# Patient Record
Sex: Male | Born: 1935 | Race: White | Hispanic: No | Marital: Married | Smoking: Former smoker
Health system: Southern US, Community
[De-identification: ages and names within clinical notes are randomized; demographics above are authoritative.]

## PROBLEM LIST (undated history)

## (undated) DIAGNOSIS — I739 Peripheral vascular disease, unspecified: Secondary | ICD-10-CM

## (undated) DIAGNOSIS — H3530 Unspecified macular degeneration: Secondary | ICD-10-CM

## (undated) DIAGNOSIS — I10 Essential (primary) hypertension: Secondary | ICD-10-CM

## (undated) DIAGNOSIS — N400 Enlarged prostate without lower urinary tract symptoms: Secondary | ICD-10-CM

## (undated) DIAGNOSIS — I639 Cerebral infarction, unspecified: Secondary | ICD-10-CM

## (undated) DIAGNOSIS — J8410 Pulmonary fibrosis, unspecified: Secondary | ICD-10-CM

## (undated) DIAGNOSIS — E039 Hypothyroidism, unspecified: Secondary | ICD-10-CM

## (undated) DIAGNOSIS — C801 Malignant (primary) neoplasm, unspecified: Secondary | ICD-10-CM

## (undated) DIAGNOSIS — E119 Type 2 diabetes mellitus without complications: Secondary | ICD-10-CM

## (undated) DIAGNOSIS — I6529 Occlusion and stenosis of unspecified carotid artery: Secondary | ICD-10-CM

## (undated) DIAGNOSIS — I509 Heart failure, unspecified: Secondary | ICD-10-CM

## (undated) DIAGNOSIS — M1990 Unspecified osteoarthritis, unspecified site: Secondary | ICD-10-CM

## (undated) DIAGNOSIS — E785 Hyperlipidemia, unspecified: Secondary | ICD-10-CM

## (undated) DIAGNOSIS — J449 Chronic obstructive pulmonary disease, unspecified: Secondary | ICD-10-CM

## (undated) HISTORY — DX: Pulmonary fibrosis, unspecified: J84.10

## (undated) HISTORY — DX: Hyperlipidemia, unspecified: E78.5

## (undated) HISTORY — DX: Occlusion and stenosis of unspecified carotid artery: I65.29

## (undated) HISTORY — PX: BACK SURGERY: SHX140

## (undated) HISTORY — DX: Essential (primary) hypertension: I10

## (undated) HISTORY — PX: OTHER SURGICAL HISTORY: SHX169

## (undated) HISTORY — DX: Peripheral vascular disease, unspecified: I73.9

## (undated) HISTORY — PX: EYE SURGERY: SHX253

## (undated) HISTORY — DX: Benign prostatic hyperplasia without lower urinary tract symptoms: N40.0

## (undated) HISTORY — DX: Unspecified macular degeneration: H35.30

## (undated) HISTORY — DX: Heart failure, unspecified: I50.9

## (undated) HISTORY — DX: Cerebral infarction, unspecified: I63.9

## (undated) HISTORY — DX: Chronic obstructive pulmonary disease, unspecified: J44.9

## (undated) HISTORY — PX: SPINE SURGERY: SHX786

## (undated) NOTE — *Deleted (*Deleted)
[facility] Nurse calls with patient increase work of breathing, now tachycardic.  Bedside patient admits to feeling very short of breathe, restless and unable to be comfortable.   He has not peripheral edema.  Abdomen slight distention but soft, no pain. Definitely has accessory muscle use with breathing.  Bipap ordered BNP doubled in past week. Checking troponin.  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  Instant improvement in work of breathing noted.   With decompensation, checking troponin,  procalcitonin and d dimer.  Not large volume diuresis after oral torsemide.  Bernie Covey 60 mg IV lasix

## (undated) NOTE — *Deleted (*Deleted)
  Heart Failure Nurse Navigator Note  HFpEF 60-65%, echocardiogram performed on this admission reveals flattened septum, elevated RV pressures, right ventricle is severely enlarged. Right ventricular systolic function is moderately reduced.  Severely elevated PAP at 82.6.  Severely enlarged right atria.  Moderate MR and moderate to severe TR.  Previous echo had revealed normal right ventricular function from 07/20/2020.  He had presented to the ED by way of EMS for increasing SOB,orthopnea and weeping, swollen lower extremities.  Co morbidities:  Diabetes Hyperlipidemia Hypertension Osteoarthritis COPD   Medications:  Wears 4 liters of 02 at home Atenolol 50 mg daily Lasix 40 mg IV BID   Labs:  Sodium 134,potassium 3.3, BUN 26, creatinine 1.2  Weight 65.1 BMI 23.16     Intake 240 ml Output 1000 ml  CTA revealed no acute pulmonary embolus.  Persistent finding of interstitial lung disease.   Assessment:  General- he is awake and alert, wife is at the bedside.  States he is not quite back to his baseline.  HEENT- HOH. Pupils equal.  Cardiac- heart tones regular  Chest- scattered rhonchi   Musculoskeletal-1+ edema pitting, dressings placed by wound nurse intact on bilateral lower legs and heal of right foot.  Neuro- speech is clear, moves all extremities   Psych- is pleasant and appropriate.   Visited with patient and wife on two different occasions.  Discussed importance of limiting fluid intake to less than 2000 ml daily and stressed importance of also limiting sodium to 1500 to 2000 mg daily.  Also discussed using fresh and frozen vegetable, or if have to use canned to rinse very well.  Also discussed reporting signs and symptoms of increased lower leg edema, especially if is present in the morning with waking up.  Went over other symptoms of PND, Orthopnea, cough, increasing abdominal girth, pointed out if one day pants were comfortable fitting and the next day  tight.  All these need to be reported to cardiologist or Tina Hackney in HF clinic.  Both voice understanding.  Will continue to follow.  Jakyren Fluegge RN, CHFN   

---

## 1958-11-09 HISTORY — PX: SPINE SURGERY: SHX786

## 1999-08-27 ENCOUNTER — Encounter (INDEPENDENT_AMBULATORY_CARE_PROVIDER_SITE_OTHER): Payer: Self-pay | Admitting: Specialist

## 1999-08-27 ENCOUNTER — Other Ambulatory Visit: Admission: RE | Admit: 1999-08-27 | Discharge: 1999-08-27 | Payer: Self-pay | Admitting: Gastroenterology

## 1999-10-07 ENCOUNTER — Encounter: Admission: RE | Admit: 1999-10-07 | Discharge: 1999-10-07 | Payer: Self-pay

## 1999-10-07 ENCOUNTER — Ambulatory Visit (HOSPITAL_BASED_OUTPATIENT_CLINIC_OR_DEPARTMENT_OTHER): Admission: RE | Admit: 1999-10-07 | Discharge: 1999-10-07 | Payer: Self-pay

## 1999-12-03 ENCOUNTER — Encounter: Admission: RE | Admit: 1999-12-03 | Discharge: 1999-12-03 | Payer: Self-pay | Admitting: Family Medicine

## 1999-12-03 ENCOUNTER — Encounter: Payer: Self-pay | Admitting: Family Medicine

## 2000-11-09 DIAGNOSIS — I739 Peripheral vascular disease, unspecified: Secondary | ICD-10-CM

## 2000-11-09 HISTORY — PX: PR VEIN BYPASS GRAFT,AORTO-FEM-POP: 35551

## 2000-11-09 HISTORY — DX: Peripheral vascular disease, unspecified: I73.9

## 2001-06-21 ENCOUNTER — Ambulatory Visit (HOSPITAL_COMMUNITY): Admission: RE | Admit: 2001-06-21 | Discharge: 2001-06-21 | Payer: Self-pay | Admitting: Orthopaedic Surgery

## 2001-07-28 ENCOUNTER — Encounter: Payer: Self-pay | Admitting: Vascular Surgery

## 2001-08-01 ENCOUNTER — Ambulatory Visit (HOSPITAL_COMMUNITY): Admission: RE | Admit: 2001-08-01 | Discharge: 2001-08-01 | Payer: Self-pay | Admitting: Vascular Surgery

## 2001-08-09 HISTORY — PX: TOE AMPUTATION: SHX809

## 2001-08-25 ENCOUNTER — Encounter: Payer: Self-pay | Admitting: Vascular Surgery

## 2001-08-25 ENCOUNTER — Encounter (INDEPENDENT_AMBULATORY_CARE_PROVIDER_SITE_OTHER): Payer: Self-pay | Admitting: *Deleted

## 2001-08-25 ENCOUNTER — Inpatient Hospital Stay (HOSPITAL_COMMUNITY): Admission: RE | Admit: 2001-08-25 | Discharge: 2001-08-29 | Payer: Self-pay | Admitting: Vascular Surgery

## 2001-11-09 HISTORY — PX: ABDOMINAL AORTIC ANEURYSM REPAIR: SUR1152

## 2002-02-20 ENCOUNTER — Encounter: Admission: RE | Admit: 2002-02-20 | Discharge: 2002-02-20 | Payer: Self-pay | Admitting: Vascular Surgery

## 2002-02-20 ENCOUNTER — Encounter: Payer: Self-pay | Admitting: Vascular Surgery

## 2002-03-06 ENCOUNTER — Ambulatory Visit (HOSPITAL_COMMUNITY): Admission: RE | Admit: 2002-03-06 | Discharge: 2002-03-06 | Payer: Self-pay | Admitting: Cardiovascular Disease

## 2002-03-09 ENCOUNTER — Encounter: Payer: Self-pay | Admitting: Vascular Surgery

## 2002-03-13 ENCOUNTER — Ambulatory Visit (HOSPITAL_COMMUNITY): Admission: RE | Admit: 2002-03-13 | Discharge: 2002-03-13 | Payer: Self-pay | Admitting: Vascular Surgery

## 2002-03-16 ENCOUNTER — Inpatient Hospital Stay (HOSPITAL_COMMUNITY): Admission: RE | Admit: 2002-03-16 | Discharge: 2002-03-22 | Payer: Self-pay | Admitting: Vascular Surgery

## 2002-03-16 ENCOUNTER — Encounter (INDEPENDENT_AMBULATORY_CARE_PROVIDER_SITE_OTHER): Payer: Self-pay | Admitting: Specialist

## 2002-03-16 ENCOUNTER — Encounter: Payer: Self-pay | Admitting: Vascular Surgery

## 2002-03-17 ENCOUNTER — Encounter: Payer: Self-pay | Admitting: Vascular Surgery

## 2002-03-19 ENCOUNTER — Encounter: Payer: Self-pay | Admitting: Vascular Surgery

## 2002-05-03 ENCOUNTER — Ambulatory Visit (HOSPITAL_COMMUNITY): Admission: RE | Admit: 2002-05-03 | Discharge: 2002-05-03 | Payer: Self-pay | Admitting: Internal Medicine

## 2002-05-04 ENCOUNTER — Encounter: Payer: Self-pay | Admitting: Internal Medicine

## 2002-12-07 ENCOUNTER — Emergency Department (HOSPITAL_COMMUNITY): Admission: EM | Admit: 2002-12-07 | Discharge: 2002-12-07 | Payer: Self-pay

## 2003-01-17 ENCOUNTER — Encounter: Payer: Self-pay | Admitting: Internal Medicine

## 2003-01-17 ENCOUNTER — Encounter: Admission: RE | Admit: 2003-01-17 | Discharge: 2003-01-17 | Payer: Self-pay | Admitting: Internal Medicine

## 2003-04-11 ENCOUNTER — Encounter: Admission: RE | Admit: 2003-04-11 | Discharge: 2003-04-11 | Payer: Self-pay | Admitting: Vascular Surgery

## 2003-04-11 ENCOUNTER — Encounter: Payer: Self-pay | Admitting: Vascular Surgery

## 2003-10-01 ENCOUNTER — Encounter: Admission: RE | Admit: 2003-10-01 | Discharge: 2003-10-01 | Payer: Self-pay | Admitting: Internal Medicine

## 2003-11-10 DIAGNOSIS — I639 Cerebral infarction, unspecified: Secondary | ICD-10-CM

## 2003-11-10 HISTORY — PX: PR VEIN BYPASS GRAFT,AORTO-FEM-POP: 35551

## 2003-11-10 HISTORY — DX: Cerebral infarction, unspecified: I63.9

## 2004-03-05 ENCOUNTER — Ambulatory Visit (HOSPITAL_COMMUNITY): Admission: RE | Admit: 2004-03-05 | Discharge: 2004-03-05 | Payer: Self-pay | Admitting: Orthopaedic Surgery

## 2004-09-29 ENCOUNTER — Ambulatory Visit (HOSPITAL_COMMUNITY): Admission: RE | Admit: 2004-09-29 | Discharge: 2004-09-29 | Payer: Self-pay | Admitting: Vascular Surgery

## 2004-10-14 ENCOUNTER — Inpatient Hospital Stay (HOSPITAL_COMMUNITY): Admission: AD | Admit: 2004-10-14 | Discharge: 2004-10-15 | Payer: Self-pay | Admitting: Vascular Surgery

## 2005-02-16 ENCOUNTER — Encounter: Admission: RE | Admit: 2005-02-16 | Discharge: 2005-05-17 | Payer: Self-pay | Admitting: Internal Medicine

## 2005-05-08 ENCOUNTER — Encounter: Admission: RE | Admit: 2005-05-08 | Discharge: 2005-05-08 | Payer: Self-pay | Admitting: Internal Medicine

## 2005-06-11 ENCOUNTER — Encounter: Admission: RE | Admit: 2005-06-11 | Discharge: 2005-09-09 | Payer: Self-pay | Admitting: Internal Medicine

## 2005-08-31 ENCOUNTER — Inpatient Hospital Stay (HOSPITAL_COMMUNITY): Admission: EM | Admit: 2005-08-31 | Discharge: 2005-09-02 | Payer: Self-pay | Admitting: Emergency Medicine

## 2005-09-07 ENCOUNTER — Encounter: Admission: RE | Admit: 2005-09-07 | Discharge: 2005-09-07 | Payer: Self-pay | Admitting: Internal Medicine

## 2005-09-23 ENCOUNTER — Encounter: Admission: RE | Admit: 2005-09-23 | Discharge: 2005-09-23 | Payer: Self-pay | Admitting: Internal Medicine

## 2005-12-03 ENCOUNTER — Encounter (INDEPENDENT_AMBULATORY_CARE_PROVIDER_SITE_OTHER): Payer: Self-pay | Admitting: Specialist

## 2005-12-03 ENCOUNTER — Inpatient Hospital Stay (HOSPITAL_COMMUNITY): Admission: RE | Admit: 2005-12-03 | Discharge: 2005-12-04 | Payer: Self-pay | Admitting: Vascular Surgery

## 2005-12-03 HISTORY — PX: CAROTID ENDARTERECTOMY: SUR193

## 2006-08-16 ENCOUNTER — Encounter: Admission: RE | Admit: 2006-08-16 | Discharge: 2006-08-16 | Payer: Self-pay | Admitting: Internal Medicine

## 2006-11-09 ENCOUNTER — Emergency Department: Payer: Self-pay | Admitting: General Practice

## 2006-11-25 ENCOUNTER — Ambulatory Visit (HOSPITAL_BASED_OUTPATIENT_CLINIC_OR_DEPARTMENT_OTHER): Admission: RE | Admit: 2006-11-25 | Discharge: 2006-11-25 | Payer: Self-pay | Admitting: Orthopedic Surgery

## 2007-01-16 ENCOUNTER — Emergency Department: Payer: Self-pay | Admitting: Emergency Medicine

## 2007-05-04 ENCOUNTER — Ambulatory Visit: Payer: Self-pay | Admitting: Vascular Surgery

## 2007-05-20 ENCOUNTER — Ambulatory Visit (HOSPITAL_COMMUNITY): Admission: RE | Admit: 2007-05-20 | Discharge: 2007-05-20 | Payer: Self-pay | Admitting: Internal Medicine

## 2008-01-03 ENCOUNTER — Ambulatory Visit: Payer: Self-pay | Admitting: Vascular Surgery

## 2008-03-20 ENCOUNTER — Ambulatory Visit: Payer: Self-pay | Admitting: Vascular Surgery

## 2008-04-24 ENCOUNTER — Ambulatory Visit: Payer: Self-pay | Admitting: Vascular Surgery

## 2008-05-01 ENCOUNTER — Encounter: Admission: RE | Admit: 2008-05-01 | Discharge: 2008-05-01 | Payer: Self-pay | Admitting: Internal Medicine

## 2008-07-31 ENCOUNTER — Ambulatory Visit: Payer: Self-pay | Admitting: Vascular Surgery

## 2008-10-02 ENCOUNTER — Encounter: Admission: RE | Admit: 2008-10-02 | Discharge: 2008-11-08 | Payer: Self-pay | Admitting: Vascular Surgery

## 2008-10-09 ENCOUNTER — Ambulatory Visit: Payer: Self-pay | Admitting: Vascular Surgery

## 2008-11-14 ENCOUNTER — Inpatient Hospital Stay (HOSPITAL_COMMUNITY): Admission: EM | Admit: 2008-11-14 | Discharge: 2008-11-17 | Payer: Self-pay | Admitting: Emergency Medicine

## 2009-03-01 ENCOUNTER — Ambulatory Visit (HOSPITAL_BASED_OUTPATIENT_CLINIC_OR_DEPARTMENT_OTHER): Admission: RE | Admit: 2009-03-01 | Discharge: 2009-03-01 | Payer: Self-pay | Admitting: Orthopedic Surgery

## 2009-03-12 ENCOUNTER — Ambulatory Visit: Payer: Self-pay | Admitting: Vascular Surgery

## 2009-03-20 ENCOUNTER — Encounter: Admission: RE | Admit: 2009-03-20 | Discharge: 2009-03-20 | Payer: Self-pay | Admitting: Internal Medicine

## 2009-10-16 ENCOUNTER — Ambulatory Visit (HOSPITAL_BASED_OUTPATIENT_CLINIC_OR_DEPARTMENT_OTHER): Admission: RE | Admit: 2009-10-16 | Discharge: 2009-10-16 | Payer: Self-pay | Admitting: Orthopedic Surgery

## 2010-01-01 ENCOUNTER — Encounter: Payer: Self-pay | Admitting: Internal Medicine

## 2010-04-11 ENCOUNTER — Ambulatory Visit: Payer: Self-pay | Admitting: Vascular Surgery

## 2010-11-22 ENCOUNTER — Observation Stay (HOSPITAL_COMMUNITY)
Admission: EM | Admit: 2010-11-22 | Discharge: 2010-11-23 | Payer: Self-pay | Source: Home / Self Care | Attending: Urology | Admitting: Urology

## 2010-11-24 LAB — DIFFERENTIAL
Basophils Absolute: 0 10*3/uL (ref 0.0–0.1)
Basophils Relative: 0 % (ref 0–1)
Eosinophils Absolute: 0.4 10*3/uL (ref 0.0–0.7)
Eosinophils Relative: 3 % (ref 0–5)
Lymphocytes Relative: 14 % (ref 12–46)
Lymphs Abs: 1.5 10*3/uL (ref 0.7–4.0)
Monocytes Absolute: 1.3 10*3/uL — ABNORMAL HIGH (ref 0.1–1.0)
Monocytes Relative: 12 % (ref 3–12)
Neutro Abs: 7.5 10*3/uL (ref 1.7–7.7)
Neutrophils Relative %: 70 % (ref 43–77)

## 2010-11-24 LAB — CBC
HCT: 46.4 % (ref 39.0–52.0)
Hemoglobin: 16 g/dL (ref 13.0–17.0)
MCH: 33.8 pg (ref 26.0–34.0)
MCHC: 34.5 g/dL (ref 30.0–36.0)
MCV: 98.1 fL (ref 78.0–100.0)
Platelets: 189 10*3/uL (ref 150–400)
RBC: 4.73 MIL/uL (ref 4.22–5.81)
RDW: 12.9 % (ref 11.5–15.5)
WBC: 10.7 10*3/uL — ABNORMAL HIGH (ref 4.0–10.5)

## 2010-11-29 ENCOUNTER — Encounter: Payer: Self-pay | Admitting: Cardiology

## 2010-11-30 NOTE — Op Note (Signed)
  NAME:  Dylan Knight, Dylan Knight               ACCOUNT NO.:  [id number]  MEDICAL RECORD NO.:  [id number]          PATIENT TYPE:  OBV  LOCATION:  1444                         FACILITY:  [facility]  PHYSICIAN:  Heloise Purpura, MD      DATE OF BIRTH:  [birth year]  DATE OF PROCEDURE:  11/22/2010 DATE OF DISCHARGE:                              OPERATIVE REPORT   PREOPERATIVE DIAGNOSIS:  Scrotal bleeding.  POSTOPERATIVE DIAGNOSIS:  Scrotal bleeding.  PROCEDURE:  Scrotal exploration with control of scrotal bleeding.  SURGEON:  Heloise Purpura, MD  ANESTHESIA:  General.  COMPLICATIONS:  None.  INDICATION:  [name] is an 77 year old gentleman who underwent an office procedure yesterday for removal of a scrotal cyst.  He is on chronic antiplatelet therapy with Plavix, although have been holding this medication.  His last dose of Plavix was approximately 48 hours ago.  His wife called earlier today with concern about ongoing bleeding from the scrotum which was not controlled with compressive pressure.  He therefore brought into the emergency department for further evaluation and he was noted to have ongoing uncontrolled bleeding from his scrotal incision.  It was therefore recommended that he proceed to the operating room for exploration and control of this bleeding.  The potential risks, complications, and alternative treatment options were discussed in detail.  Informed consent was obtained.  DESCRIPTION OF PROCEDURE:  The patient was taken to the operating room and general anesthetic was administered.  He was given preoperative antibiotics, and placed in the supine position.  His genitalia was then prepped and draped in the usual sterile fashion and pressure was held on the scrotal wound during this process.  A preoperative time-out was performed.  The patient's previous chromic sutures were then removed and there was noted to be active bleeding from the superior aspect of the wound.  No obvious  arterial bleeding was identified.  Using electrocautery, hemostasis was achieved fairly medially and examination of the remainder of the wound did reveal some very mild oozing without any further significant bleeding.  These areas were also controlled with electrocautery and the skin edges were then again reapproximated with interrupted 3-0 chromic sutures.  There was noted to be a small amount of bleeding from the inferior aspect of the wound and then started toward the end of the procedure which was identified and again the cauterize resulting in excellent hemostasis.  Due to the patient's need to void prior to the procedure, the patient's bladder was then emptied with a 14-French red rubber catheter.  A compressive fluff dressing was then placed on the scrotum.  He tolerated the procedure well without complications.  He was able to be extubated and transferred to the recovery unit in satisfactory condition.     Heloise Purpura, MD     LB/MEDQ  D:  11/22/2010  T:  11/22/2010  Job:  [id number]  cc:   Bertram Millard. Dahlstedt, M.D. Fax: [phone]  Electronically Signed by Heloise Purpura MD on 11/30/2010 05:17:58 PM

## 2010-11-30 NOTE — Discharge Summary (Signed)
  NAME:  Dylan Knight, Dylan Knight               ACCOUNT NO.:  [id number]  MEDICAL RECORD NO.:  [id number]          PATIENT TYPE:  OBV  LOCATION:  1444                         FACILITY:  [facility]  PHYSICIAN:  Heloise Purpura, MD      DATE OF BIRTH:  08/16/1924  DATE OF ADMISSION:  11/22/2010 DATE OF DISCHARGE:  11/23/2010                              DISCHARGE SUMMARY   ADMISSION DIAGNOSIS:  Bleeding from scrotum.  DISCHARGE DIAGNOSIS:  Bleeding from scrotum.  HISTORY AND PHYSICAL:  For full details, please see admission history and physical.  Briefly, Dylan Knight is an 77 year old gentleman who underwent an office procedure on Friday for removal of a scrotal cyst. He is on chronic antiplatelet therapy with Plavix and was noted to have uncontrolled bleeding which persisted throughout the afternoon on November 22, 2010.  He, therefore, presented to the emergency department and was evaluated.  He indeed had bleeding from his scrotal incision which was unable to be controlled but was managed with pressure.  It was recommended that he proceed to the operating room for scrotal exploration and control of his bleeding.  HOSPITAL COURSE:  On November 22, 2010, the patient was taken to the operating room and underwent scrotal exploration.  He was noted to have a bleeding site within the scrotal incision which was controlled with electrocautery.  His wound was able to be closed primarily and he was admitted overnight for observation.  His hemoglobin remained stable and he had no further bleeding from his scrotal incision when evaluated on postoperative day #1.  He was therefore felt stable for discharge.  DISPOSITION:  Home.  DISCHARGE MEDICATIONS:  He will resume all his regular home medications excepting Plavix which will be held for the next 72 hours.  In addition, he will be administered a prescription for Vicodin to take as needed.  DISCHARGE INSTRUCTIONS:  He has been instructed to be ambulatory  but specifically told to refrain from any heavy lifting, strenuous activity, or driving.  He will continue with a fluff scrotal pressure dressing. He will tentatively plan to restart his Plavix on Tuesday if he has no evidence of bleeding between now and then and will plan to follow up with Dr. Retta Diones in early February as planned.     Heloise Purpura, MD     LB/MEDQ  D:  11/23/2010  T:  11/23/2010  Job:  [id number]  cc:   Bertram Millard. Dahlstedt, M.D. Fax: [phone]  Electronically Signed by Heloise Purpura MD on 11/30/2010 05:17:56 PM

## 2010-12-22 ENCOUNTER — Encounter (HOSPITAL_BASED_OUTPATIENT_CLINIC_OR_DEPARTMENT_OTHER)
Admission: RE | Admit: 2010-12-22 | Discharge: 2010-12-22 | Disposition: A | Discharge status: Home / Self Care | Payer: Medicare Other | Source: Ambulatory Visit | Attending: Orthopedic Surgery | Admitting: Orthopedic Surgery

## 2010-12-22 ENCOUNTER — Ambulatory Visit
Admission: RE | Admit: 2010-12-22 | Discharge: 2010-12-22 | Disposition: A | Discharge status: Home / Self Care | Payer: Medicare Other | Source: Ambulatory Visit | Attending: Orthopedic Surgery | Admitting: Orthopedic Surgery

## 2010-12-22 ENCOUNTER — Other Ambulatory Visit: Payer: Self-pay | Admitting: Orthopedic Surgery

## 2010-12-22 DIAGNOSIS — Z01811 Encounter for preprocedural respiratory examination: Secondary | ICD-10-CM

## 2010-12-22 DIAGNOSIS — Z01812 Encounter for preprocedural laboratory examination: Secondary | ICD-10-CM | POA: Insufficient documentation

## 2010-12-22 LAB — BASIC METABOLIC PANEL
BUN: 12 mg/dL (ref 6–23)
CO2: 29 mEq/L (ref 19–32)
Calcium: 9.6 mg/dL (ref 8.4–10.5)
Chloride: 100 mEq/L (ref 96–112)
Creatinine, Ser: 1.27 mg/dL (ref 0.4–1.5)
GFR calc Af Amer: >60 mL/min (ref >60)
GFR calc non Af Amer: 54 mL/min — ABNORMAL LOW (ref >60)
Glucose, Bld: 168 mg/dL — ABNORMAL HIGH (ref 70–99)
Potassium: 3.9 mEq/L (ref 3.5–5.1)
Sodium: 138 mEq/L (ref 135–145)

## 2010-12-23 ENCOUNTER — Ambulatory Visit (HOSPITAL_BASED_OUTPATIENT_CLINIC_OR_DEPARTMENT_OTHER)
Admission: RE | Admit: 2010-12-23 | Discharge: 2010-12-23 | Disposition: A | Discharge status: Home / Self Care | Payer: Medicare Other | Attending: Orthopedic Surgery | Admitting: Orthopedic Surgery

## 2010-12-23 DIAGNOSIS — Z01818 Encounter for other preprocedural examination: Secondary | ICD-10-CM | POA: Insufficient documentation

## 2010-12-23 DIAGNOSIS — Z01812 Encounter for preprocedural laboratory examination: Secondary | ICD-10-CM | POA: Insufficient documentation

## 2010-12-23 DIAGNOSIS — M65839 Other synovitis and tenosynovitis, unspecified forearm: Secondary | ICD-10-CM | POA: Insufficient documentation

## 2010-12-23 DIAGNOSIS — M6530 Trigger finger, unspecified finger: Secondary | ICD-10-CM | POA: Insufficient documentation

## 2010-12-23 LAB — POCT HEMOGLOBIN-HEMACUE: Hemoglobin: 15.8 g/dL (ref 13.0–17.0)

## 2010-12-23 LAB — GLUCOSE, CAPILLARY
Glucose-Capillary: 154 mg/dL — ABNORMAL HIGH (ref 70–99)
Glucose-Capillary: 140 mg/dL — ABNORMAL HIGH (ref 70–99)

## 2010-12-29 NOTE — Op Note (Signed)
  NAME:  Dylan Knight, Dylan Knight               ACCOUNT NO.:  [id number]  MEDICAL RECORD NO.:  [id number]           PATIENT TYPE:  LOCATION:                                 FACILITY:  PHYSICIAN:  Cindee Salt, M.D.       DATE OF BIRTH:  05/14/1924  DATE OF PROCEDURE:  12/23/2010 DATE OF DISCHARGE:                              OPERATIVE REPORT   PREOPERATIVE DIAGNOSIS:  Stenosing tenosynovitis, left thumb, left little finger.  POSTOPERATIVE DIAGNOSIS:  Stenosing tenosynovitis, left thumb, left little finger.  OPERATION:  Release A1 pulley, left thumb, left small finger.  SURGEON:  Cindee Salt, MD.  ASSISTANT:  Betha Loa, MD  ANESTHESIA:  Forearm based IV regional with local infiltration.  ANESTHESIOLOGIST:  Janetta Hora. Frederick, MD.  HISTORY:  The patient is an 78 year old male with a history of triggering of multiple fingers, most recently his left thumb, left little.  He has attempted injections without relief.  He is admitted now for release of both fingers.  Pre, peri, and postoperative course had been discussed along with the risks and complications.  He is aware that there is no guarantee with the surgery, possibility of infection, recurrence of injury to arteries, nerves, tendons, incomplete relief of symptoms, dystrophy.  In the preoperative area, the patient is seen, the extremity marked by both the patient and surgeon, antibiotic given.  PROCEDURE:  The patient was brought to the operating room where a forearm based IV regional anesthetic was carried out without difficulty, was prepped using ChloraPrep, supine position, left arm free.  A 3- minute dry time was allowed.  Time-out taken confirming the patient and procedure.  A transverse incision was made over the A1 pulley of the left thumb, carried down through subcutaneous tissue.  Bleeders were electrocauterized with bipolar.  Neurovascular structures were identified and protected with retractors.  The A1 pulley was  then released in its radial aspect.  The oblique pulley was left intact.  The thumb placed through full mobility.  No further triggering was noted.  A separate incision was then made over the left small finger, metacarpophalangeal joint, carried down through the subcutaneous tissue. The neurovascular structures were again identified and protected.  The dissection carried down to the A1 pulley.  This was released with a small incision in the A2 pulley.  Centrally, moderate tenosynovitis was present.  No further lesions were identified.  The wound was again irrigated.  The skin was closed with interrupted 5-0 Vicryl Rapide sutures.  Local infiltration was given with 0.25% Marcaine without epinephrine, approximately 4 mL was used.  Sterile compressive dressing was applied. Deflation of the tourniquet, all fingers immediately pinked.  He was taken to the recovery room for observation in satisfactory condition. He will be discharged home to return to the [facility] of [place] in 1 week, on Vicodin.          ______________________________ Cindee Salt, M.D.     GK/MEDQ  D:  12/23/2010  T:  12/24/2010  Job:  [id number]  Electronically Signed by Cindee Salt M.D. on 12/29/2010 02:26:16 PM

## 2011-01-29 ENCOUNTER — Ambulatory Visit
Admission: RE | Admit: 2011-01-29 | Discharge: 2011-01-29 | Disposition: A | Discharge status: Home / Self Care | Payer: Medicare Other | Source: Ambulatory Visit | Attending: Family Medicine | Admitting: Family Medicine

## 2011-01-29 ENCOUNTER — Other Ambulatory Visit: Payer: Self-pay | Admitting: Family Medicine

## 2011-01-29 DIAGNOSIS — R059 Cough, unspecified: Secondary | ICD-10-CM

## 2011-01-29 DIAGNOSIS — R05 Cough: Secondary | ICD-10-CM

## 2011-02-10 LAB — BASIC METABOLIC PANEL
BUN: 15 mg/dL (ref 6–23)
CO2: 30 mEq/L (ref 19–32)
Calcium: 9.8 mg/dL (ref 8.4–10.5)
Chloride: 98 mEq/L (ref 96–112)
Creatinine, Ser: 1.22 mg/dL (ref 0.4–1.5)
GFR calc Af Amer: >60 mL/min (ref >60)
GFR calc non Af Amer: 57 mL/min — ABNORMAL LOW (ref >60)
Glucose, Bld: 202 mg/dL — ABNORMAL HIGH (ref 70–99)
Potassium: 3.8 mEq/L (ref 3.5–5.1)
Sodium: 137 mEq/L (ref 135–145)

## 2011-02-10 LAB — POCT HEMOGLOBIN-HEMACUE: Hemoglobin: 17.7 g/dL — ABNORMAL HIGH (ref 13.0–17.0)

## 2011-02-10 LAB — GLUCOSE, CAPILLARY: Glucose-Capillary: 148 mg/dL — ABNORMAL HIGH (ref 70–99)

## 2011-02-18 LAB — BASIC METABOLIC PANEL
BUN: 12 mg/dL (ref 6–23)
CO2: 27 mEq/L (ref 19–32)
Calcium: 9.9 mg/dL (ref 8.4–10.5)
Chloride: 99 mEq/L (ref 96–112)
Creatinine, Ser: 1.26 mg/dL (ref 0.4–1.5)
GFR calc Af Amer: >60 mL/min (ref >60)
GFR calc non Af Amer: 55 mL/min — ABNORMAL LOW (ref >60)
Glucose, Bld: 231 mg/dL — ABNORMAL HIGH (ref 70–99)
Potassium: 3.5 mEq/L (ref 3.5–5.1)
Sodium: 138 mEq/L (ref 135–145)

## 2011-02-18 LAB — GLUCOSE, CAPILLARY: Glucose-Capillary: 125 mg/dL — ABNORMAL HIGH (ref 70–99)

## 2011-02-18 LAB — POCT HEMOGLOBIN-HEMACUE: Hemoglobin: 17.7 g/dL — ABNORMAL HIGH (ref 13.0–17.0)

## 2011-02-23 LAB — BASIC METABOLIC PANEL
BUN: 11 mg/dL (ref 6–23)
BUN: 17 mg/dL (ref 6–23)
BUN: 17 mg/dL (ref 6–23)
BUN: 20 mg/dL (ref 6–23)
BUN: 22 mg/dL (ref 6–23)
CO2: 26 mEq/L (ref 19–32)
CO2: 26 mEq/L (ref 19–32)
CO2: 27 mEq/L (ref 19–32)
CO2: 29 mEq/L (ref 19–32)
CO2: 30 mEq/L (ref 19–32)
Calcium: 8.6 mg/dL (ref 8.4–10.5)
Calcium: 8.9 mg/dL (ref 8.4–10.5)
Calcium: 9.2 mg/dL (ref 8.4–10.5)
Calcium: 9.3 mg/dL (ref 8.4–10.5)
Calcium: 9.8 mg/dL (ref 8.4–10.5)
Chloride: 101 mEq/L (ref 96–112)
Chloride: 101 mEq/L (ref 96–112)
Chloride: 105 mEq/L (ref 96–112)
Chloride: 98 mEq/L (ref 96–112)
Chloride: 98 mEq/L (ref 96–112)
Creatinine, Ser: 1.25 mg/dL (ref 0.4–1.5)
Creatinine, Ser: 1.3 mg/dL (ref 0.4–1.5)
Creatinine, Ser: 1.34 mg/dL (ref 0.4–1.5)
Creatinine, Ser: 1.34 mg/dL (ref 0.4–1.5)
Creatinine, Ser: 1.56 mg/dL — ABNORMAL HIGH (ref 0.4–1.5)
GFR calc Af Amer: 52 mL/min — ABNORMAL LOW (ref >60)
GFR calc Af Amer: >60 mL/min (ref >60)
GFR calc Af Amer: >60 mL/min (ref >60)
GFR calc Af Amer: >60 mL/min (ref >60)
GFR calc Af Amer: >60 mL/min (ref >60)
GFR calc non Af Amer: 43 mL/min — ABNORMAL LOW (ref >60)
GFR calc non Af Amer: 51 mL/min — ABNORMAL LOW (ref >60)
GFR calc non Af Amer: 51 mL/min — ABNORMAL LOW (ref >60)
GFR calc non Af Amer: 53 mL/min — ABNORMAL LOW (ref >60)
GFR calc non Af Amer: 55 mL/min — ABNORMAL LOW (ref >60)
Glucose, Bld: 156 mg/dL — ABNORMAL HIGH (ref 70–99)
Glucose, Bld: 158 mg/dL — ABNORMAL HIGH (ref 70–99)
Glucose, Bld: 162 mg/dL — ABNORMAL HIGH (ref 70–99)
Glucose, Bld: 221 mg/dL — ABNORMAL HIGH (ref 70–99)
Glucose, Bld: 321 mg/dL — ABNORMAL HIGH (ref 70–99)
Potassium: 3 mEq/L — ABNORMAL LOW (ref 3.5–5.1)
Potassium: 3.3 mEq/L — ABNORMAL LOW (ref 3.5–5.1)
Potassium: 3.3 mEq/L — ABNORMAL LOW (ref 3.5–5.1)
Potassium: 3.9 mEq/L (ref 3.5–5.1)
Potassium: 3.9 mEq/L (ref 3.5–5.1)
Sodium: 135 mEq/L (ref 135–145)
Sodium: 136 mEq/L (ref 135–145)
Sodium: 137 mEq/L (ref 135–145)
Sodium: 137 mEq/L (ref 135–145)
Sodium: 141 mEq/L (ref 135–145)

## 2011-02-23 LAB — CBC
HCT: 40.5 % (ref 39.0–52.0)
HCT: 45.2 % (ref 39.0–52.0)
HCT: 46.7 % (ref 39.0–52.0)
HCT: 50.5 % (ref 39.0–52.0)
Hemoglobin: 13.9 g/dL (ref 13.0–17.0)
Hemoglobin: 15.3 g/dL (ref 13.0–17.0)
Hemoglobin: 15.5 g/dL (ref 13.0–17.0)
Hemoglobin: 16.9 g/dL (ref 13.0–17.0)
MCHC: 33.2 g/dL (ref 30.0–36.0)
MCHC: 33.4 g/dL (ref 30.0–36.0)
MCHC: 33.8 g/dL (ref 30.0–36.0)
MCHC: 34.4 g/dL (ref 30.0–36.0)
MCV: 100 fL (ref 78.0–100.0)
MCV: 100.6 fL — ABNORMAL HIGH (ref 78.0–100.0)
MCV: 99 fL (ref 78.0–100.0)
MCV: 99.1 fL (ref 78.0–100.0)
Platelets: 238 10*3/uL (ref 150–400)
Platelets: 251 10*3/uL (ref 150–400)
Platelets: 255 10*3/uL (ref 150–400)
Platelets: 255 10*3/uL (ref 150–400)
RBC: 4.09 MIL/uL — ABNORMAL LOW (ref 4.22–5.81)
RBC: 4.56 MIL/uL (ref 4.22–5.81)
RBC: 4.64 MIL/uL (ref 4.22–5.81)
RBC: 5.05 MIL/uL (ref 4.22–5.81)
RDW: 13.3 % (ref 11.5–15.5)
RDW: 13.4 % (ref 11.5–15.5)
RDW: 13.4 % (ref 11.5–15.5)
RDW: 13.5 % (ref 11.5–15.5)
WBC: 12.6 10*3/uL — ABNORMAL HIGH (ref 4.0–10.5)
WBC: 15.4 10*3/uL — ABNORMAL HIGH (ref 4.0–10.5)
WBC: 9.2 10*3/uL (ref 4.0–10.5)
WBC: 9.8 10*3/uL (ref 4.0–10.5)

## 2011-02-23 LAB — TSH
TSH: 0.167 u[IU]/mL — ABNORMAL LOW (ref 0.350–4.500)
TSH: 0.572 u[IU]/mL (ref 0.350–4.500)

## 2011-02-23 LAB — GLUCOSE, CAPILLARY
Glucose-Capillary: 113 mg/dL — ABNORMAL HIGH (ref 70–99)
Glucose-Capillary: 116 mg/dL — ABNORMAL HIGH (ref 70–99)
Glucose-Capillary: 130 mg/dL — ABNORMAL HIGH (ref 70–99)
Glucose-Capillary: 137 mg/dL — ABNORMAL HIGH (ref 70–99)
Glucose-Capillary: 157 mg/dL — ABNORMAL HIGH (ref 70–99)
Glucose-Capillary: 163 mg/dL — ABNORMAL HIGH (ref 70–99)
Glucose-Capillary: 165 mg/dL — ABNORMAL HIGH (ref 70–99)
Glucose-Capillary: 185 mg/dL — ABNORMAL HIGH (ref 70–99)
Glucose-Capillary: 212 mg/dL — ABNORMAL HIGH (ref 70–99)
Glucose-Capillary: 242 mg/dL — ABNORMAL HIGH (ref 70–99)
Glucose-Capillary: 249 mg/dL — ABNORMAL HIGH (ref 70–99)
Glucose-Capillary: 269 mg/dL — ABNORMAL HIGH (ref 70–99)
Glucose-Capillary: 287 mg/dL — ABNORMAL HIGH (ref 70–99)
Glucose-Capillary: 358 mg/dL — ABNORMAL HIGH (ref 70–99)

## 2011-02-23 LAB — DIFFERENTIAL
Basophils Absolute: 0 10*3/uL (ref 0.0–0.1)
Basophils Relative: 0 % (ref 0–1)
Eosinophils Absolute: 0 10*3/uL (ref 0.0–0.7)
Eosinophils Relative: 0 % (ref 0–5)
Lymphocytes Relative: 9 % — ABNORMAL LOW (ref 12–46)
Lymphs Abs: 1.1 10*3/uL (ref 0.7–4.0)
Monocytes Absolute: 1.5 10*3/uL — ABNORMAL HIGH (ref 0.1–1.0)
Monocytes Relative: 12 % (ref 3–12)
Neutro Abs: 10 10*3/uL — ABNORMAL HIGH (ref 1.7–7.7)
Neutrophils Relative %: 79 % — ABNORMAL HIGH (ref 43–77)

## 2011-02-23 LAB — POCT CARDIAC MARKERS
CKMB, poc: 2 ng/mL (ref 1.0–8.0)
Myoglobin, poc: 190 ng/mL (ref 12–200)
Troponin i, poc: <0.05 ng/mL (ref 0.00–0.09)

## 2011-02-23 LAB — BRAIN NATRIURETIC PEPTIDE: Pro B Natriuretic peptide (BNP): 84 pg/mL (ref 0.0–100.0)

## 2011-02-23 LAB — CARDIAC PANEL(CRET KIN+CKTOT+MB+TROPI)
CK, MB: 4.6 ng/mL — ABNORMAL HIGH (ref 0.3–4.0)
CK, MB: 9.2 ng/mL — ABNORMAL HIGH (ref 0.3–4.0)
Relative Index: 2.3 (ref 0.0–2.5)
Relative Index: 2.5 (ref 0.0–2.5)
Total CK: 201 U/L (ref 7–232)
Total CK: 365 U/L — ABNORMAL HIGH (ref 7–232)
Troponin I: 0.01 ng/mL (ref 0.00–0.06)
Troponin I: <0.01 ng/mL (ref 0.00–0.06)

## 2011-02-23 LAB — TROPONIN I: Troponin I: 0.01 ng/mL (ref 0.00–0.06)

## 2011-02-23 LAB — HEMOGLOBIN A1C
Hgb A1c MFr Bld: 7 % — ABNORMAL HIGH (ref 4.6–6.1)
Mean Plasma Glucose: 154 mg/dL

## 2011-02-23 LAB — CK TOTAL AND CKMB (NOT AT ARMC)
CK, MB: 3.2 ng/mL (ref 0.3–4.0)
Relative Index: 2.6 — ABNORMAL HIGH (ref 0.0–2.5)
Total CK: 121 U/L (ref 7–232)

## 2011-02-23 LAB — D-DIMER, QUANTITATIVE (NOT AT ARMC): D-Dimer, Quant: 1.78 ug/mL-FEU — ABNORMAL HIGH (ref 0.00–0.48)

## 2011-03-04 ENCOUNTER — Ambulatory Visit
Admission: RE | Admit: 2011-03-04 | Discharge: 2011-03-04 | Disposition: A | Discharge status: Home / Self Care | Payer: Medicare Other | Source: Ambulatory Visit | Attending: Family Medicine | Admitting: Family Medicine

## 2011-03-04 ENCOUNTER — Other Ambulatory Visit: Payer: Self-pay | Admitting: Family Medicine

## 2011-03-04 DIAGNOSIS — J449 Chronic obstructive pulmonary disease, unspecified: Secondary | ICD-10-CM

## 2011-03-04 DIAGNOSIS — J189 Pneumonia, unspecified organism: Secondary | ICD-10-CM

## 2011-03-24 NOTE — Procedures (Signed)
CAROTID DUPLEX EXAM   INDICATION:  Known carotid disease.   HISTORY:  Diabetes:  yes  Cardiac:  no  Hypertension:  yes  Smoking:  Quit in 2005  Previous Surgery:  Right carotid endarterectomy in 12/03/2005 and left  carotid stent on 03/16/2002  CV History:  Stroke in 2005  Amaurosis Fugax  No, Paresthesias  No, Hemiparesis Yes                                       RIGHT             LEFT  Brachial systolic pressure:         132               127  Brachial Doppler waveforms:         normal            normal  Vertebral direction of flow:        Antegrade         Unable to  visualize  DUPLEX VELOCITIES (cm/sec)  CCA peak systolic                   63                72  ECA peak systolic                   73                137  ICA peak systolic                   72                125  ICA end diastolic                   28                47  PLAQUE MORPHOLOGY:                  none              mixed  PLAQUE AMOUNT:                      none              mild  PLAQUE LOCATION:                    none              ICA / ECA   IMPRESSION:  1. Patent right carotid artery with no evidence of restenosis.  2. Left internal carotid artery Doppler velocity suggests 40% to 59%      stenosis, unable to obtain  high velocities from previous exam.  3. Antegrade flow in right vertebral arteries.  Unable to visualize      left vertebral artery.   ___________________________________________  Di Kindle. Edilia Bo, M.D.   NT/MEDQ  D:  04/11/2010  T:  04/11/2010  Job:  [id number]

## 2011-03-24 NOTE — Procedures (Signed)
CAROTID DUPLEX EXAM   INDICATION:  Follow up of known carotid artery disease.   HISTORY:  Diabetes:  Yes.  Cardiac:  No.  Hypertension:  Yes.  Smoking:  Quit in 2005.  Previous Surgery:  Right carotid endarterectomy on 12/03/05 by Dr.  Edilia Bo, left carotid stent on 03/16/02.  CV History:  Stroke in 2005.  Amaurosis Fugax No, Paresthesias Yes, Hemiparesis  Yes, from previous  strokes.                                       RIGHT             LEFT  Brachial systolic pressure:         110               112  Brachial Doppler waveforms:         Within normal limits                Within normal limits  Vertebral direction of flow:        Antegrade         Antegrade  DUPLEX VELOCITIES (cm/sec)  CCA peak systolic                   98                86  ECA peak systolic                   103               134  ICA peak systolic                   56                164  ICA end diastolic                   22                59  PLAQUE MORPHOLOGY:                  None              Heterogenous  PLAQUE AMOUNT:                      None              Mild  PLAQUE LOCATION:                    None              BIF, ICA   IMPRESSION:  1. Patent right internal carotid artery with no evidence of      restenosis.  2. Left internal carotid artery velocity suggests 60-79% restenosis.   ___________________________________________  Di Kindle. Edilia Bo, M.D.   AC/MEDQ  D:  03/12/2009  T:  03/12/2009  Job:  [id number]

## 2011-03-24 NOTE — Procedures (Signed)
BYPASS GRAFT EVALUATION   INDICATION:  Followup evaluation of lower extremity bypass graft.  The  patient reports heaviness and numbness in the right leg.   HISTORY:  Diabetes:  Diet controlled.  Cardiac:  No.  Hypertension:  Yes.  Smoking:  Quit in 2005.  Previous Surgery:  Right fem-pop bypass graft with saphenous vein on  August 25, 2001.  Vein patch angioplasty with vein graft stenosis on  October 14, 2004.  Aortobi-iliac bypass graft on Mar 16, 2002, at  [place].   SINGLE LEVEL ARTERIAL EXAM                               RIGHT              LEFT  Brachial:                    110                110  Anterior tibial:             88                 56  Posterior tibial:            106  Peroneal:                    88                 34  Ankle/brachial index:        0.96               0.67   PREVIOUS ABI:  Date: May 04, 2007  RIGHT:  >1.0  LEFT:  0.75   LOWER EXTREMITY BYPASS GRAFT DUPLEX EXAM:   DUPLEX:  Doppler arterial waveforms are biphasic proximal to, within and  distal to the right fem-pop bypass graft.   IMPRESSION:  1. ABIs are stable from previous study bilaterally.  2. Patent left fem-pop bypass graft.  3. Patent aortoiliac bypass graft.   ___________________________________________  Di Kindle. Edilia Bo, M.D.   MC/MEDQ  D:  01/03/2008  T:  01/04/2008  Job:  [id number]

## 2011-03-24 NOTE — Procedures (Signed)
BYPASS GRAFT EVALUATION   INDICATION:  Follow up lower extremity bypass graft evaluation.   HISTORY:  Diabetes:  Yes.  Cardiac:  No.  Hypertension:  Yes.  Smoking:  Quit.  Previous Surgery:  Right femoral to popliteal artery bypass graft with  saphenous vein on 08/25/2001.  Vein angiopathy of vein graft stenosis  10/14/2004.  Aortoiliac bypass graft on 03/16/2002.   SINGLE LEVEL ARTERIAL EXAM                               RIGHT              LEFT  Brachial:                    110                112  Anterior tibial:             129                69  Posterior tibial:            106  Peroneal:                                       84  Ankle/brachial index:        1.15               0.62   PREVIOUS ABI:  Date: 10/09/2008  RIGHT:  1.14  LEFT:  0.69   LOWER EXTREMITY BYPASS GRAFT DUPLEX EXAM:   DUPLEX:  1. Patent right fem-pop bypass graft with no evidence of focal      stenosis.  2. Biphasic duplex waveform noted within graft and native artery.   IMPRESSION:  1. Normal right lower extremity ABIs.  2. Left lower extremity ABIs suggest moderate to severe arterial      disease.   ___________________________________________  Dylan Knight. Edilia Bo, M.D.   AC/MEDQ  D:  03/12/2009  T:  03/12/2009  Job:  [id number]

## 2011-03-24 NOTE — Assessment & Plan Note (Signed)
OFFICE VISIT   Dylan Knight, Dylan Knight  DOB:  10-02-1924                                       04/11/2010  VHQIO#:[id number]   DATES OF SURGERIES:  1. Right femoral to above-knee popliteal bypass grafting with the      right 5th toe amputation October 2002.  2. Repair of an infrarenal abdominal aortic aneurysm 2003.  3. Revision of his femoral popliteal bypass 2005.  4. Most recently he underwent right carotid endarterectomy in January      2007.   The patient returns today for followup for his peripheral vascular  disease.  At this time, he feels that he has remained stable in regards  to his disease.  He has had no history of focal weaknesses,  paresthesias, aphasias or amaurosis.  He continues to complain of back  pain which is felt to be due to his back problems.   PHYSICAL EXAMINATION:  General:  Physical findings revealed a very  pleasant 77 year old gentleman who appeared well-nourished and in no  distress.  HEENT:  PERRLA, EOMI.  Mucous membranes were pink and moist.  Cardiac:  Revealed a regular rate and rhythm.  Abdomen:  Soft,  nontender, nondistended.  Musculoskeletal:  Demonstrated no major  deformities or cyanosis.  Extremities:  I appreciated no ulcers or  rashes on his lower extremities.  Peripheral pulses were palpable.   LABORATORY RESULTS:  He did undergo carotid Dopplers which demonstrated  a patent right carotid artery and was without evidence of restenosis.  The left internal carotid Doppler suggested a 40% to 59% narrowing which  was a less than the 60% to 79% narrowing of his previous scan.  His  right vertebral artery was antegrade.  Left vertebral artery was unable  to be visualized.  His ABI on the right was 1.01 in comparison to 1.15 a  year ago and 0.66 in comparison to 0.62 a year ago on the left.  His  ABIs appear to be stable from his previous exam.   The patient continues to do well following his multiple peripheral  vascular  interventions.  We will continue to surveil him on a yearly  basis for his carotids and his peripheral vascular disease.  At this  time, he has no symptoms attributable to claudication or ischemic brain  disease.  As I mentioned previously, he will see [place] in 1 year.  He will  call us if there is any difficulties in the intervening time.   Wilmon Arms, PA   Larina Earthly, M.D.  Electronically Signed   KEL/MEDQ  D:  04/11/2010  T:  04/11/2010  Job:  [id number]

## 2011-03-24 NOTE — Procedures (Signed)
BYPASS GRAFT EVALUATION   INDICATION:  Followup fem-pop bypass graft.   HISTORY:  Diabetes:  Yes.  Cardiac:  No.  Hypertension:  Yes.  Smoking:  Quit.  Previous Surgery:  Right fem-fem bypass graft on 08/25/2001, vein  angioplasty of vein graft 10/14/2004 and aortic iliac bypass graft on  03/16/2002.   SINGLE LEVEL ARTERIAL EXAM                               RIGHT              LEFT  Brachial:                    132                127  Anterior tibial:             133                83  Posterior tibial:            119                87  Peroneal:  Ankle/brachial index:        1.01               0.66   PREVIOUS ABI:  Date:  03/12/2009  RIGHT:  1.15  LEFT:  0.62   LOWER EXTREMITY BYPASS GRAFT DUPLEX EXAM:   DUPLEX:  Patent right fem-pop bypass graft with no evidence of stenosis.   IMPRESSION:  1. Stable ankle brachial indices from previous exams.  2. Patent right femoral-popliteal bypass graft.   ___________________________________________  Di Kindle. Edilia Bo, M.D.   NT/MEDQ  D:  04/11/2010  T:  04/11/2010  Job:  [id number]

## 2011-03-24 NOTE — Op Note (Signed)
NAME:  Dylan Knight, Dylan Knight               ACCOUNT NO.:  [id number]   MEDICAL RECORD NO.:  [id number]          PATIENT TYPE:  AMB   LOCATION:  DSC                          FACILITY:  [facility]   PHYSICIAN:  Cindee Salt, M.D.       DATE OF BIRTH:  Dec 19, 1923   DATE OF PROCEDURE:  03/01/2009  DATE OF DISCHARGE:                               OPERATIVE REPORT   PREOPERATIVE DIAGNOSIS:  Stenosing tenosynovitis, right little finger.   POSTOPERATIVE DIAGNOSIS:  Stenosing tenosynovitis, right little finger.   OPERATION:  Release A1 pulley, right little finger.   SURGEON:  Cindee Salt, MD   ASSISTANT:  Carolyne Fiscal, RN   ANESTHESIA:  Forearm-based IV regional with supplementation.   ANESTHESIOLOGIST:  Sheldon Silvan, MD.   HISTORY:  The patient is an 77 year old male with a history of multiple  trigger fingers.  He has developed a trigger finger of his right little  finger, which has not responded to conservative treatment.  He has  elected to undergo surgical release.  Postoperative course was discussed  along with risks and complications.  He is aware that there is no  guarantee with surgery, possibility of infection, recurrence injury to  arteries, nerves, tendons, incomplete relief of symptoms, or dystrophy.  In the preoperative area, the patient is seen.  The extremity marked by  both the patient and surgeon.  Antibiotic given.   PROCEDURE:  The patient was brought to the operating room where a  forearm-based IV regional anesthetic was carried out without difficulty  under the direction of Dr. Ivin Booty.  He was prepped using ChloraPrep,  supine position, right arm free.  A 3-minute dry time was taken.  He was  draped after a time-out was taken confirming the patient and procedure.  An oblique incision was made over the A1 pulley of right little finger,  carried down through subcutaneous tissue.  The A1 pulley was identified.  This was released in its radial aspect after a retractor was placed to  protect  the neurovascular bundles, both radially and ulnarly.  The area  was then locally infiltrated with 0.25% Marcaine without epinephrine 3  mL was used.  Wound was copiously irrigated with saline.  The skin  closed with interrupted 5-0 Vicryl Rapide sutures.  Sterile compressive  dressing was applied to the hand leaving the fingers free.  The patient  tolerated the procedure well, was taken to the recovery room for  observation in satisfactory condition.  He will be discharged home.  To  return to the [facility] [place] in 1 week, on Vicodin.          ______________________________  Cindee Salt, M.D.    GK/MEDQ  D:  03/01/2009  T:  03/02/2009  Job:  [id number]   cc:   Olene Craven, M.D.

## 2011-03-24 NOTE — Discharge Summary (Signed)
NAME:  Dylan Knight, Dylan Knight               ACCOUNT NO.:  [id number]   MEDICAL RECORD NO.:  [id number]          PATIENT TYPE:  INP   LOCATION:  4704                         FACILITY:  [facility]   PHYSICIAN:  Beckey Rutter, MD  DATE OF BIRTH:  1924-03-17   DATE OF ADMISSION:  11/13/2008  DATE OF DISCHARGE:  11/17/2008                               DISCHARGE SUMMARY   PRIMARY CARE PHYSICIAN:  Mr. Coye sees Sarah with [facility].   CHIEF COMPLAINT ON PRESENTATION:  Cough and shortness of breath.   HISTORY OF PRESENT ILLNESS:  This is a very pleasant 77 year old  Caucasian male with past medical history significant for hypertension,  dyslipidemia, and stroke, who presented with shortness of breath.   HOSPITAL COURSE:  1. During the hospital course, the patient was treated for picture of      pneumonia.  He was ruled out for pulmonary embolism with CT      angiogram.  The patient was on Avelox for the first day and then      the Avelox was switched to Zithromax and Rocephin.  The patient did      better on this combination with improvement of his white blood      count.  He also received nebulized medication.  Today, he is stable      for discharge.  He will be discharged on Augmentin and to finish      the course of Tamiflu for 1 more day.  He does not need further      azithromycin after discharge.  2. Hyperthyroidism.  The patient was taking 100 mg of Propyl-Thyracil.      According to the thyroid-stimulating hormone studies we did during      the hospital course, the medication might need to be adjusted,      i.e., increased nevertheless, secondary to the fact that there is      no full thyroid study done together with the fact that the patient      preferred to follow up with [facility], I would defer      the change in the medication to his primary care Maija Biggers which is      currently Sarah, [place].  They are more comfortable with that approach,      and the  patient as well as his wife wanted to be released today and      they did not want to stay for further thyroid evaluation.  The      patient is stable for discharge, but they are aware of the need to      follow up within few days with the primary physician to address the      thyroid issue.   HOSPITAL PROCEDURE RESULTS:  Sodium is 137, potassium 3.3, chloride is  101, bicarb is 29, glucose is 162, BUN is 17, creatinine is 1.34.  White  blood count is 9.2, hemoglobin is 15.3, hematocrit is 45.2, and platelet  count is 255.  Chest x-ray on the day of admission was showing no active  lung disease.  No change in hyperventilation and mild periorbital  thickening.   CT angiogram on November 15, 2007, with the impression is:  1. No embolus is identified.  2. Biapical pleural parenchymal scarring.  3. Emphysema.  4. Airway thickening with airway plugging in the right lower lobe and      bibasilar cylindrical bronchiectasis.  There is interstitial      prominence in the right lower lobe which, maybe, represent early      bronchopneumonia.  5. Faint linear calcification posteriorly along the dural at the T10      vertebral level of uncertain significance.  6. Mild right heart enlargement.   DISCHARGE DIAGNOSES:  1. Right lower lobe bronchopneumonia.  2. H1N1 ruled out.  The patient finished a course of Tamiflu.  3. Hypertension.  4. History of stroke in 2005.  5. Dyslipidemia.  6. Status post right carotid endarterectomy and left carotid stent      placement.  7. Macular degeneration in the left eye.   DISCHARGE MEDICATIONS:  1. Augmentin 875 mg p.o. b.i.d. for 5 more days.  2. Tamiflu 75 mg p.o. b.i.d. for 1 more day, prescription for #3 was      written.  3. PTU 50 mg 2 tablets in the a.m.  4. Plavix 75 mg daily.  5. Atenolol/chlorthalidone 50/25 daily.  6. Vytorin 40 mg at bedtime.  7. Metformin 500 mg twice a day.  8. Flomax 0.4 mg at bedtime.  9. Detrol LA 4 mg at bedtime.   10.Centrum Silver multivitamin daily.  11.Vitamin D 1000 International Units daily.  12.Mucinex twice a day p.r.n.  13.Loratadine 10 mg daily.  14.Perther Vision Eye twice daily.   DISCHARGE PLAN:  The patient is stable for discharge.  Today, he does  not have fever for the 24 hours.  The patient is aware that he would  need to follow up with primary to address his thyroid management.  He  has TSH done on November 13, 2008, with the result 0.167 which is below  the normal range for our lab which is 0.35-4.500.  A repeat of TSH done  on November 16, 2008, the TSH is 0.572, which is within the normal range  to our lab again is 0.35-4.500.  The patient and his wife is aware and  agreeable to discharge plan.      Beckey Rutter, MD  Electronically Signed     EME/MEDQ  D:  11/17/2008  T:  11/18/2008  Job:  [id number]   cc:   Pollyann Savoy, MD  Candyce Churn Allyne Gee, M.D.

## 2011-03-24 NOTE — Procedures (Signed)
CAROTID DUPLEX EXAM   INDICATION:  Followup, carotid artery disease.   HISTORY:  Diabetes:  Yes.  Cardiac:  No.  Hypertension:  Yes.  Smoking:  Quit in 2005.  Previous Surgery:  Right carotid endarterectomy on 12/03/05 by Dr.  Edilia Bo, left carotid stent on 03/16/02 by [facility].  CV History:  Stroke in 2005.  Amaurosis Fugax No, Paresthesias Yes, Hemiparesis Yes, from previous  stroke.                                       RIGHT             LEFT  Brachial systolic pressure:         116               118  Brachial Doppler waveforms:         Normal            Normal  Vertebral direction of flow:        Antegrade         Not visualized  DUPLEX VELOCITIES (cm/sec)  CCA peak systolic                   78                82  ECA peak systolic                   89                65  ICA peak systolic                   77                161  ICA end diastolic                   29                55  PLAQUE MORPHOLOGY:                  None              None  PLAQUE AMOUNT:                      None              None  PLAQUE LOCATION:                    None              None   IMPRESSION:  1. Patent right carotid endarterectomy site with no evidence of      stenosis.  2. Patent left internal carotid artery stent with no evidence of      restenosis.  3. No significant change noted from the previous examination on      05/04/07.   ___________________________________________  Di Kindle. Edilia Bo, M.D.   CH/MEDQ  D:  03/20/2008  T:  03/20/2008  Job:  [id number]

## 2011-03-24 NOTE — Assessment & Plan Note (Signed)
OFFICE VISIT   [name], [name]  DOB:  1924/03/03                                       03/12/2009  [id number]#:[id number]   I saw the patient in the office today for continued followup of his  carotid disease and his peripheral vascular disease.   I last saw him in September of 2009.  He comes in for a routine followup  visit.  With respect to his carotid disease he had a left hemispheric  stroke in 2005 but has had no symptoms since that time.  He has had no  history of focal weakness or paresthesias, expressive or receptive  aphasia or amaurosis fugax.   He does have some back pain which he has had for some time and also pain  in the hips associated with ambulation.  He also experiences this pain  in his back and hips when simply standing, however.  I do not get any  history of calf claudication or history of rest pain or nonhealing  ulcers.   REVIEW OF SYSTEMS AND MEDICATIONS:  Is documented on the medical history  form in his chart as are his medications.   PAST SURGICAL HISTORY:  Significant for a right fem above knee pop  bypass with a vein graft and right fifth toe amputation in October of  2002.  In May of 2003 he had repair of an infrarenal abdominal aortic  aneurysm with aorto left femoral right iliac artery bypass graft.  In  December of 2005 he had revision of his right fem-pop bypass graft with  vein patch angioplasty of the graft.  Most recently he had a right  carotid endarterectomy in January of 2007.   PHYSICAL EXAMINATION:  This is a pleasant 77 year old gentleman who  appears his stated age.  His blood pressure is 116/76, heart rate is 74.  HEENT is unremarkable.  Neck is supple.  There is no cervical  lymphadenopathy.  I do not detect any carotid bruits.  Lungs are clear  bilaterally to auscultation.  On cardiac exam he has a regular rate and  rhythm.  Abdomen is soft and nontender.  He has palpable femoral pulses.  He has a palpable  right popliteal pulse.  I cannot palpate pedal pulses,  however, both feet are warm and well-perfused without ischemic ulcers.  He has mild bilateral lower extremity swelling.  Neurologic exam is  nonfocal.   His carotid duplex scan shows his right carotid endarterectomy site is  widely patent with no evidence of restenosis on the right.  On the left  side he has a 60-79% stenosis in the lower end of that range.   Duplex of his fem-pop bypass graft shows that his bypass graft is widely  patent without areas of increased velocity within the graft.   With respect to his back pain I think this is related possibly to lumbar  disk disease.  His symptoms occur with standing and I do not think his  symptoms can be explained from aortoiliac occlusive disease as he has  normal femoral pulses.  I have encouraged him to stay as active as  possible.   With respect to his moderate left carotid stenosis I have recommended a  followup duplex scan in 1 year and I will see him back at that time.  He  knows to call sooner if  he has problems.  Will continue to follow his  graft on our graft protocol.   Di Kindle. Edilia Bo, M.D.  Electronically Signed   CSD/MEDQ  D:  03/12/2009  T:  03/13/2009  Job:  [id number]

## 2011-03-24 NOTE — Assessment & Plan Note (Signed)
OFFICE VISIT   Dylan, Knight  DOB:  [birth year]                                       05/04/2007  EAVWU#:[id number]   I saw [name] in the office today for continued followup of his  multiple vascular issues.  He has had a previous right carotid  endarterectomy by myself in January of 2007 for a tight right carotid  stenosis.  I previously performed a right fem-pop bypass graft and I  have also repaired an infrarenal abdominal aortic aneurysm back in May  of 2003.  His bypass graft on the right leg was in 2002.  He had a  carotid stent placed on the left somewhat urgently at [facility].  He comes in for routine followup studies.   His only complaint is he has been having some lower back pain which  radiates to his hips and somewhat to his thigh on the left side.  This  occurs with ambulation and with simply standing.  He was seen by Dr.  Ophelia Charter and apparently had an MRI study which was fairly unremarkable.   I did not get any history of rest pain or history of nonhealing ulcers.  He has had no history of stroke, TIAs, expressive or receptive aphasia  or amaurosis fugax.   REVIEW OF SYSTEMS:  He has had no recent chest pain, chest pressure,  palpitations or arrhythmias.  He has had no bronchitis, asthma or  wheezing.   PHYSICAL EXAMINATION:  Blood pressure is 146/87, heart rate is 67.  I  did not detect any carotid bruits.  LUNGS:  Clear bilaterally to auscultation.  CARDIAC EXAM:  He has a regular rate and rhythm.  ABDOMEN:  Soft and nontender.  He has palpable femoral pulses and a  palpable right popliteal pulse.  Both feet are warm and well perfused  with brisk Doppler signals in both feet.   Carotid duplex scan shows his right carotid endarterectomy site is  widely patent without evidence of restenosis.  The left carotid stent is  patent without evidence of stenosis.  His fem-pop bypass graft is patent  with no areas of increased  velocities within the graft.  ABI is 100% on  the right.  On the left side, ABI is 75%, which is actually improved  compared to the study back in August of 2007.   I reassured him that I did not think his back or hip pain is related to  his peripheral vascular disease.  The fact that the symptoms occur with  rest does not fit with this.  In addition, he has a normal ABI on the  right side with a normal femoral pulse and his ABI on the left is  actually improved.   I was unable to pull up his recent MRI.  However, I did find an old MRI  from 2005 which suggested some lumbar disk disease.  There was  multilevel degenerative spondylitic changes and multiple sites of  stenosis predominantly involving the foramina.  There was also noted to  be some arachnoiditis.   With respect to his back pain, he is, I believe, scheduled to follow up  with Dr. Ophelia Charter.  With respect to his peripheral vascular disease,  everything remains stable and he is due for followup studies in  December.  I will see him back at that  time.  He is to call sooner if he  has problems.   Di Kindle. Edilia Bo, M.D.  Electronically Signed   CSD/MEDQ  D:  05/04/2007  T:  05/05/2007  Job:  [id number]   cc:   Veverly Fells. Ophelia Charter, M.D.  Olene Craven, M.D.

## 2011-03-24 NOTE — Assessment & Plan Note (Signed)
OFFICE VISIT   ONEILL, BAIS B  DOB:  January 12, 1924                                       04/24/2008  [id number]#:[id number]   I saw the patient in the office today for continued followup of his  multiple vascular issues.  In October of 2002 he had a right femoral to  above knee pop bypass with a vein graft and right fifth toe amputation.  In May of 2003 he had repair of an abdominal aortic aneurysm with aorto  left femoral right iliac bypass.  In December of 2005 he had a revision  of his right fem-pop bypass with vein patch angioplasty of a stenosis  within the graft.  In 2005 he had a left hemispheric stroke and  elsewhere underwent a carotid stent on the left.  In January of 2007 he  had a right carotid endarterectomy with Dacron patch angioplasty.  He  comes in for routine followup visit.  His main complaint has been some  heaviness in the right leg which he has had for years essentially.  He  gives no clear cut history of claudication or rest pain.  He denies any  significant unusual swelling in the right leg.  He has had no history of  stroke since I saw him last, or history of TIAs, amaurosis fugax or  expressive or receptive aphasia.   PAST MEDICAL HISTORY:  Significant for borderline diabetes which was  diagnosed recently.  In addition, he has a history of hypertension and  hypercholesterolemia.  He denies any history of COPD, previous MI or  history of congestive heart failure.   FAMILY HISTORY:  His mother died from a stroke.  His father died from a  myocardial infarction at age 41.  He is unaware of any history of  premature cardiovascular disease.   SOCIAL HISTORY:  He is married.  He has four children.  He quit tobacco  in June of 2005 after he had his stroke.   REVIEW OF SYSTEMS:  GENERAL:  He has had no recent weight loss, weight  gain with his appetite.  CARDIAC:  He has had no chest pain, chest pressure, palpitations or  arrhythmias.  He  does admit to some dyspnea on exertion.  PULMONARY:  He has had no recent productive cough, bronchitis, asthma or  wheezing.  GI:  He has had no recent change in his bowel habits and has no history  of peptic ulcer disease.  GU:  He has had no dysuria or frequency.  VASCULAR:  He has had some heaviness in the right leg which is limiting  his mobility but no clear cut history of claudication or rest pain.  NEURO:  He has had no dizziness, blackouts, headaches or seizures.  HEMATOLOGIC:  He has had no bleeding problems or clotting disorders.   PHYSICAL EXAMINATION:  General:  This is a pleasant 77 year old  gentleman who appears his stated age.  Vital signs:  Blood pressure is  131/69, heart rate is 80.  HEENT:  Neck is supple.  There is no cervical  lymphadenopathy.  I do not detect any carotid bruits.  Lungs:  Are clear  bilaterally to auscultation.  Cardiac:  On exam he has a regular rate  and rhythm.  Abdomen:  Soft and nontender.  He has normal pitched bowel  sounds.  He has  palpable femoral pulses and a palpable right popliteal  pulse.  I cannot palpate pedal pulses however both feet are warm and  well perfused.  He has no significant lower extremity swelling.  Neurological:  Nonfocal.   Arterial Doppler study in our office today shows an ABI of 100% on the  right with a biphasic posterior tibial signal and a monophasic dorsalis  pedis signal.  On the left side he has monophasic signals with an ABI of  79%.   I have reassured him that I do not think his leg pain was related to  arterial insufficiency.  He has normal ABIs and a palpable popliteal  pulse on the right.  I think his aortobifemoral bypass graft and right  fem-pop bypass graft are functioning well.  I do not see evidence of  significant chronic venous insufficiency.  He does have some mild right  lower extremity swelling which is likely lymphedema related to his  previous surgery.  He does have a history of some back  pain but  apparently he has undergone a previous workup which did not show any  significant lumbar disk disease.   Overall, I am pleased with his progress.  I think his grafts are patent.  He is due for a followup carotid duplex scan in May of 2010.  His most  recent study looked good.  With respect to his heaviness in the right  leg he does not think this is related to his previous left hemispheric  stroke.  It could be related to his mild lymphedema on the right leg and  we will try some mild compression therapy for this.  He also was  concerned that he might need a scooter to assist with his activities and  I think this is perfectly reasonable.  We will have him check into this.  I plan on seeing him back in May when he comes in back for his routine  followup studies.  He knows to call sooner if he has problems.  In the  meantime he knows to continue taking his aspirin.  Of note, his  medications are documented on the medical form in his chart.   Di Kindle. Edilia Bo, M.D.  Electronically Signed   CSD/MEDQ  D:  04/24/2008  T:  04/25/2008  Job:  [id number]

## 2011-03-24 NOTE — Procedures (Signed)
BYPASS GRAFT EVALUATION   INDICATION:  Follow-up right femoral-popliteal artery bypass graft.   HISTORY:  Diabetes:  Yes.  Cardiac:  No.  Hypertension:  Yes.  Smoking:  Quit.  Previous Surgery:  Right femoral-popliteal artery bypass graft with  saphenous vein 08/25/2001.  Vein angioplasty of vein graft stenosis  10/14/2004.  Aortoiliac bypass graft 03/16/2002.   SINGLE LEVEL ARTERIAL EXAM                               RIGHT              LEFT  Brachial:                    118                119  Anterior tibial:             133                70  Posterior tibial:            129  Peroneal:                    136                82  Ankle/brachial index:        1.14               0.69   PREVIOUS ABI:  Date: 04/24/2008  RIGHT:  1.05  LEFT:  0.79   LOWER EXTREMITY BYPASS GRAFT DUPLEX EXAM:   DUPLEX:  Doppler arterial waveforms appear biphasic proximal to, within  and distal to right femoral-popliteal artery bypass graft.   IMPRESSION:  1. Stable ABIs bilaterally.  2. Right femoral-popliteal artery bypass graft appears stable.  3. No significant changes from previous study.       ___________________________________________  Di Kindle. Edilia Bo, M.D.   AS/MEDQ  D:  10/09/2008  T:  10/09/2008  Job:  [id number]

## 2011-03-24 NOTE — H&P (Signed)
NAME:  Dylan Knight, Dylan Knight               ACCOUNT NO.:  [id number]   MEDICAL RECORD NO.:  [id number]          PATIENT TYPE:  OBV   LOCATION:  1826                         FACILITY:  [facility]   PHYSICIAN:  Ladell Pier, M.D.   DATE OF BIRTH:  09-08-1924   DATE OF ADMISSION:  11/13/2008  DATE OF DISCHARGE:                              HISTORY & PHYSICAL   CHIEF COMPLAINT:  Cough and shortness of breath.   HISTORY OF PRESENT ILLNESS:  The patient is an 77 year old white male  with a past medical history significant for multiple medical problems  including peripheral  vascular disease, history of stroke, hypertension,  and dyslipidemia.  The patient presented to the emergency room  complaining of shortness of breath and coughing up dark green mucus for  the past 4 days.  He has had no fevers and no chills.  No nausea or  vomiting.  He coughs sometimes until he almost throws up.   PAST MEDICAL HISTORY:  1. Hypertension.  2. Dyslipidemia.  3. History of stroke in 2005.  4. Status post right carotid endarterectomy and left carotid stent      placement.  5. Macular degeneration in left eye March 2008.   PAST SURGICAL HISTORY:  Back surgery in 1960, neck surgery in 1980, toe  amputation in 2002, bypass surgery of the right leg, abdominal aortic  aneurysm repair in May 2003, carpal tunnel surgery, trigger finger  surgery.   FAMILY HISTORY:  Father died from heart attack at age 52.  Mother died  from a stroke.   SOCIAL HISTORY:  The patient is married.  He has four children.  No  alcohol use.  He quit tobacco back in June 2005 when he had the stroke.   MEDICATIONS:  PTU 50 mg two in the morning, Plavix 75 mg daily,  atenolol/chlorthalidone 50/25 daily, Vytorin 40 mg at bedtime, metformin  500 mg twice daily, Flomax 0.4 mg at bedtime, Detrol LA 4 mg at bedtime,  Centrum Silver daily, vitamin D 1000 international units daily, Mucinex  twice daily, loratadine 10 mg daily, Perther Vision Eye  twice daily.   ALLERGIES:  None.   REVIEW OF SYSTEMS:  Negative unless otherwise stated in the HPI.   PHYSICAL EXAMINATION:  Temperature 97.3, blood pressure 130/61, pulse  112, respirations 37, pulse ox 98% on 2 liters.  GENERAL:  Patient is sitting up on stretcher, does not seem to be in any  acute distress.  HEENT: Head is normocephalic, atraumatic.  Pupils reactive to light.  Throat is without erythema.  CARDIOVASCULAR:  He is tachycardiac.  LUNGS:  Decreased breath sounds at the bases.  ABDOMEN:  Positive bowel sounds.  Nontender, nondistended.  EXTREMITIES: Without edema.   LABORATORY DATA:  WBC 12.6, hemoglobin 16.9, MCV 100, platelets 238.  Myoglobin 190, MB 2, troponin less than 0.05.  Sodium 137, potassium 3,  chloride 98, CO2 26, glucose 158, BUN 11, creatinine 1.25, calcium 9.8.  BNP 84, D-dimer 1.78.  Chest x-ray showed no active lung disease.  EKG  showed no acute ST-segment elevation or depression.   ASSESSMENT/PLAN:  1.  Dyspnea probably secondary to bronchitis.  2. Tachycardia.  3. Elevated D-dimer.  4. Hypokalemia.  5. Leukocytosis.  6. Hypertension.  7. Dyslipidemia.   We will admit the patient to the hospital, observation and cycle his  cardiac enzymes, repeat his EKG, replete his potassium.  Check spiral CT  of the chest with his elevated D-dimer.  Treat him with Avelox IV, neb  treatments, and Tamiflu.  Put him on droplet precautions.   Time spent with the patient and doing dictation 60 minutes.      Ladell Pier, M.D.  Electronically Signed     N[place]/MEDQ  D:  11/13/2008  T:  11/13/2008  Job:  [id number]   cc:   Merlene Laughter. Renae Gloss, M.D.

## 2011-03-24 NOTE — Assessment & Plan Note (Signed)
OFFICE VISIT   JAICION, Dylan Knight  DOB:  Apr 19, 1924                                       07/31/2008  ZDGUY#:[id number]   I saw the patient in the office today for a mobility evaluation.  The  patient has had increasing problems with his mobility related to his  lumbar disk disease and peripheral vascular disease.  In addition, he  has had a previous stroke.  His lumbar disk disease in particular limits  his ability to stand.  He can only stand for a very short time before  having to sit down because of his low back pain.  In addition, he can  only walk for a very short distance usually no more than 10-15 yards  before experiencing significant pain in his back and legs.  He also has  known peripheral vascular disease with an ABI of 67% on the left side.  These restrictions have significantly limited his ability to perform his  activities of daily living.  He is unable to use a cane or walker to  meet his mobility needs again because of his inability to stand for any  length of time.  In addition, he is unable to use a manual wheelchair  because he has limited upper extremity strength and he cannot adequately  propel the wheelchair.  He has somewhat limited range of motion also and  also has some arthritis.  I think he would require a powered wheelchair  and would not be able to use the scooter as this cannot be used as well  at home.  I think he lacks sufficient strength to operate the tiller of  the scooter.  I think he would also have a hard time getting in and out  of the scooter.  I do think that he has the physical and mental  abilities to operate the powered wheelchair safely in his home and I  think he is also willing and motivated to use the powered wheelchair.   PHYSICAL EXAMINATION:  General:  On physical examination this is a  pleasant 77 year old gentleman who appears his stated age.  Vital signs:  Blood pressure is 151/93, heart rate is 76.  Neck:   I do not detect any  carotid bruits.  Lungs:  Are clear bilaterally to auscultation.  Cardiac:  He has a regular rate and rhythm.  Abdomen:  Soft and  nontender.  Vascular:  His feet appear adequately perfused.  He has  slightly decreased strength in his upper extremities and lower  extremities symmetrically bilaterally.   In summary, I think the patient is definitely in need of a powered  wheelchair and I will provide any further documentation that is  necessary to allow him to obtain this.  I have written him a  prescription for this.   Di Kindle. Edilia Bo, M.D.  Electronically Signed   CSD/MEDQ  D:  07/31/2008  T:  08/01/2008  Job:  [id number]   cc:   Renard Matter

## 2011-03-27 NOTE — Op Note (Signed)
NAME:  Dylan Knight, Dylan Knight               ACCOUNT NO.:  [id number]   MEDICAL RECORD NO.:  [id number]          PATIENT TYPE:  OIB   LOCATION:  2899                         FACILITY:  [facility]   PHYSICIAN:  Di Kindle. 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   OPERATIONS:  1.  Aortogram.  2. Bilateral iliac arteriogram.  3. Bilateral lower      extremity run off.   SURGEON:  Di Kindle. Edilia Bo, M.D.   ANESTHESIA:  Local with sedation.   TECHNIQUE:  The patient was taken to the PV Lab at [facility] and sedated with 1  mg of Versed and 50 mcg of fentanyl.  Both groins were prepped and draped in  the usual sterile fashion.  After the skin was infiltrated with 1% lidocaine  the left limb of the aortofemoral graft was cannulated and a guide wire  introduced into the infrarenal aorta under fluoroscopic control.  A 5 French  sheath was passed over the wire and the dilator removed.  A pigtail catheter  was positioned at the L1 vertebral body and flush aortogram obtained.  The  catheter was then repositioned above the bifurcation of the graft and  bilateral lower extremity run off films were obtained and an oblique  projection was obtained of the groin.   FINDINGS:  The renal arteries were patent bilaterally.  The aortofemoral  bypass graft is patent.  The right limb of the graft is anastomosed to the  right distal common iliac artery and the left limb of the graft is  anastomosed to the left common femoral artery.  Both hypogastric arteries  are occluded.   On the right side, the external iliac and common femoral arteries are widely  patent as is the deep femoral artery.  The superficial femoral artery is  occluded at its  origin.  There is a fem-pop bypass graft which goes to the  above knee popliteal artery which is widely patent with exception of the  very distal graft which has a short segment 95% stenosis.  The anastomosis  is widely patent.  The popliteal artery is patent in their single vessel run  off via the perineal artery.  On the left side there is a mild stenosis just  below the anastomosis of the aortofemoral graft.  The deep femoral artery is  patent.  There is moderate to severe diffuse disease of the proximal 2/3 of  the superficial femoral artery.  The above knee popliteal artery and below  is widely patent.  The tibial peroneal trunk and peroneal artery are patent.  The anterior tibial and posterior tibial arteries are occluded.   CONCLUSIONS:  1.  Patent aorta right iliac left femoral graft.  2. Patent right femoral to      above knee popliteal artery bypass graft with a tight 95% distal vein  graft stenosis.  3. Moderate to severe superficial femoral artery      occlusive disease on the left.  4. Peroneal run off bilaterally with      occluded anterior tibia and posterior tibial arteries bilaterally.      Chri   CSD/MEDQ  D:  09/29/2004  T:  09/29/2004  Job:  [id number]

## 2011-03-27 NOTE — Op Note (Signed)
[place]. [facility]  Patient:    Dylan Knight                       MRN: [id number] Proc. Date: 10/07/99 Adm. Date:  [id number] Attending:  Louis Matte CC:         Dr. 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  OPERATION PERFORMED:  Bilateral upper eyelid blepharoplasty.  SURGEON:  Charmian Muff, M.D.  ESTIMATED BLOOD LOSS:  Less than 2 cc.  SPECIMENS:  None.  COMPLICATIONS:  None.  ANESTHESIA:  DESCRIPTION OF PROCEDURE:  The patient was taken to the operating room and placed in supine position.  Electronic cardiac monitoring, intravenous, pulse oximetry and anesthesia standby were in place.  2% Xylocaine with epinephrine mixed with 0.75% Marcaine and Wydase was injected into both upper eyelids.  The facial was draped and prepped per routine for occuloplastic surgery.  The upper eyelid creases were marked symmetrically, measuring 9 mm above the lashes and the vertical plane of the pupil.  The amount of skin to be removed was then marked off using the pinch technique.  Dissection was carried down to the previously marked areas with the  [place] tip needle point Bovie cautery.  The skin-muscle flap was then removed. Meticulous hemostasis was obtained throughout the procedure with needle point Bovie cautery.  The skin was closed with a running 6-0 Prolene sutures taking bites through skin and muscle and levator aponeurosis.  Polysporin ointment was placed in the incision.  Exactly identical procedures were performed on both upper eyelids.  PREOPERATIVE NOTE:  [name] is a 77 year old gentleman who complains of eyelids drooping down for three to five years.  When he pulls his  eyelids up he can see TV better and read better.  On examination, vision with correction is 20/30 in the right eye and 20/25 in the left eye.  He had good tear film bilaterally.  Fundoscopic exam was normal.  He had 3+ dermatochalasis of the upper eyelids with excellent levator function and excellent tear function.  Goldman visual fields showed improvement in the superior field of vision in the left eye from 10 degrees to 40 dgrees and more temporally and in the right eye from 10 degrees to 60 degrees and more temporally with the  lids taped up as would be obtained by surgery.  IMPRESSION:  He has bilateral upper eyelid dermatochalasis blocking superior field of vision.  DISPOSITION:  Recommend bilateral upper eyelid blepharoplasty to be performed under local anesthesia as an outpatient. DD:  10/07/99 TD:  10/08/99 Job: [id number]

## 2011-03-27 NOTE — Op Note (Signed)
[place]. [facility]  Patient:    Dylan Knight, Dylan Knight Visit Number: [id number] MRN: [id number]          Service Type: DSU Location: [facility] 2899 24 Attending Physician:  Bennye Alm Dictated by:   Di Kindle Edilia Bo, M.D. Proc. Date: 08/01/01 Admit Date:  07/29/2001 Discharge Date: 07/29/2001   CC:         Shaune Leeks, MD  Veverly Fells. Ophelia Charter, M.D.   Operative Report  PREOPERATIVE DIAGNOSIS:  POSTOPERATIVE DIAGNOSIS:  OPERATION PERFORMED: 1. Aortogram. 2. Bilateral iliac arteriogram. 3. Bilateral lower extremity run-off.  SURGEON:  Di Kindle. Edilia Bo, M.D.  ANESTHESIA:  INDICATIONS FOR PROCEDURE:  The patient is a 77 year old gentleman who was seen in consultation with bilateral lower extremity claudication.  He states that he had had claudication for four to five years; however, his symptoms had been gradually increasing.  He also noticed that he had been having some tingling in his right foot associated with  standing.  He has had no rest pain or nonhealing ulcers.  He does have a history of smoking and he has been smoking since he was 77 years old.  We discussed the importance of tobacco cessation and a structured walking program.  We also discussed the potential use of Pletol for his claudication; however, the patient felt that his symptoms were significantly disabling and therefore wanted to proceed with arteriography in order to determine if he was a candidate for revascularization.  He had ABIs done at [facility] in August which showed an ABI of 76% on the right and 79% on the left.  He was brought in for diagnostic arteriography.  The procedure and potential complications including but not limited to bleeding, arterial injury, dye reaction and kidney failure were discussed with the patient preoperatively.  All questions were answered and he wished to proceed.  DESCRIPTION OF PROCEDURE:  The  patient was brought to the peripheral vascular lab at [facility] and sedated with 1 mg of Versed and 50 mcg of fentanyl.  Both groins were prepped and draped in the usual sterile fashion.  After the skin was infiltrated with 1% lidocaine, the right common femoral artery was cannulated and a guide wire introduced into the infrarenal aorta.  A 5 French sheath was then passed over the wire and the dilator removed.  A pigtail catheter was positioned at the L1 vertebral body and flush aortogram obtained.  The catheter was repositioned about the aortic bifurcation and bilateral lower extremity run-off film obtained.  Oblique projections were obtained of the iliac arteries.  FINDINGS:  There are two renal arteries on the right and one renal artery on the left.  There is no significant renal artery stenosis identified.  The accessory renal on the right is fairly small.  Just below this there is some mild dilatation of the infrarenal aorta.  No aneurysmal disease is seen on this study although CT scan may be necessary to rule out abdominal aortic aneurysm.  On the right side he has some mild dilatation of the distal common iliac artery.  There is a very small hypogastric artery which is essentially occluded.  Beyond this, there is mild diffuse disease in the external iliac artery but no stenosis.  The narrowing here is fairly smooth without significant ulceration.  The distal external iliac artery and common femoral artery are patent.  The  deep femoral artery and proximal superficial femoral artery are patent.  There  is mild diffuse disease at the proximal superficial femoral artery with a short focal 95% stenosis which is fairly irregular in the proximal third of the SFA.  The distal superficial femoral artery and popliteal artery are patent with no significant narrowing.  There is occlusion of the proximal posterior tibial artery with reconstitution of the posterior tibial  artery distally. There are some collaterals around this occlusion suggesting a chronic occlusion.  The peroneal artery is patent to the ankle. The anterior tibial artery occludes at the ankle.  The findings in the anterior tibial and posterior tibial arteries were potentially consistent with embolic disease possibly related to this proximal superficial femoral artery stenosis.  On the left side there is some mild dilatation of the distal common iliac artery on the left.  There was some minimal narrowing just above the bifurcation of the common iliac artery on the left.  There was diffuse disease of the hypogastric artery on the left.  In the external iliac artery there were two lesions which are fairly smooth and producing approximately 50% stenosis.  The common femoral, superficial femoral, and deep femoral arteries were patent on the left side.  There was mild diffuse disease in the proximal femoral artery.  Popliteal artery was patent. There was two-vessel run-off on the left via anterior tibial and peroneal arteries.  The posterior tibial artery was occluded at its origin.  CONCLUSIONS: 1. Aortoiliac occlusive disease as described above with bilateral severe    hypogastric artery disease. 2. Moderate superficial femoral artery and tibial occlusive disease as    described above. Dictated by:   Di Kindle Edilia Bo, M.D. Attending Physician:  Bennye Alm DD:  08/01/01 TD:  08/01/01 Job: [id number]

## 2011-03-27 NOTE — Op Note (Signed)
NAME:  Dylan Knight, Dylan Knight               ACCOUNT NO.:  [id number]   MEDICAL RECORD NO.:  [id number]          PATIENT TYPE:  INP   LOCATION:  2899                         FACILITY:  [facility]   PHYSICIAN:  Di Kindle. Edilia Bo, M.D.DATE OF BIRTH:  12-06-1923   DATE OF PROCEDURE:  12/03/2005  DATE OF DISCHARGE:                                 OPERATIVE REPORT   PREOPERATIVE DIAGNOSIS:  Right carotid stenosis   POSTOPERATIVE DIAGNOSIS:  Right carotid stenosis.   PROCEDURE:  Right carotid endarterectomy with Dacron patch angioplasty.   SURGEON:  Edilia Bo.   ASSISTANT:  Pecola Leisure, PA   ANESTHESIA:  General.   INDICATIONS:  This is a 77 year old gentleman whom I have been following  with a moderate right carotid stenosis. The stenosis had progressed to  greater than 80% and right carotid endarterectomy was recommended in order  to lower his risk of future stroke. The procedure and potential  complications have been discussed with the patient preoperatively. All his  questions answered. He was agreeable to surgery.   TECHNIQUE:  The patient was taken to the operating room and received a  general anesthetic. An arterial line had been placed by anesthesia. The  right neck was prepped and draped in usual sterile fashion. An incision was  made along the anterior border of the sternocleidomastoid and dissection  carried down to the common carotid artery which was dissected free and  controlled with a Rumel tourniquet. 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 For this reason I preserve this nerve although  this made exposure more difficult. The hypoglossal nerve was clearly  identified and protected. The facial vein was divided between 2-0 silk ties  and divided. The internal carotid artery above the plaque was controlled  with a blue vessel loop. The external carotid artery was controlled  with  blue vessel loop. The patient was then heparinized. Clamps were then placed  on the internal, then the common and external carotid artery. Longitudinal  arteriotomy was made in the common carotid artery and this was extended  through the plaque into the internal carotid artery.  A 10 Bard shunt was  placed into the internal carotid artery back bled and placed into the common  carotid artery and secured with Rumel tourniquet. Flow was reestablished  through the shunt. An endarterectomy plane was established proximally. The  plaque was sharply divided. Eversion endarterectomy was performed to the  external carotid artery. Distally there was a nice taper in the plaque.  There is slight shelf which I tacked with three 6-0 Prolene sutures. The  artery was then irrigated with copious amounts of heparin and dextran and  all loose debris removed. The Dacron patch was then sewn using a continuous  6-0 Prolene suture. Again, we worked around what I felt was most likely a  non recurrent laryngeal nerve. Before the closure was completed, the shunt  was removed. The artery was back bled and flushed appropriately and  anastomosis completed. Flow was reestablished first to  the external carotid  artery and into the internal carotid artery. At completion there was good  pulse distal to the patch and good Doppler flow. Hemostasis was obtained in  the wound and the heparin was partially reversed with protamine.  The wound was closed with deep layer of 3-0 Vicryl. The platysma was closed  with running 3-0 Vicryl. The skin was closed with 4-0 subcuticular stitch. A  sterile dressing was applied. The patient tolerated procedure well and was  transferred to the recovery room in satisfactory condition. He awoke  neurologically intact.      Di Kindle. Edilia Bo, M.D.  Electronically Signed     CSD/MEDQ  D:  12/03/2005  T:  12/03/2005  Job:  [id number]   cc:   Olene Craven, M.D.  Fax:  [phone]   [facility]

## 2011-03-27 NOTE — H&P (Signed)
NAME:  Dylan Knight, Dylan Knight               ACCOUNT NO.:  [id number]   MEDICAL RECORD NO.:  [id number]          PATIENT TYPE:  OIB   LOCATION:  NA                           FACILITY:  [facility]   PHYSICIAN:  Di Kindle. Edilia Bo, M.D.DATE OF BIRTH:  1924-08-27   DATE OF ADMISSION:  DATE OF DISCHARGE:                                HISTORY & PHYSICAL   ANTICIPATED DATE OF ADMISSION:  October 14, 2004.   REASON FOR ADMISSION:  Vein graft stenosis of right fem-pop bypass graft.   HISTORY:  This is a pleasant 77 year old gentleman who had undergone a  previous right femoral to above-knee pop-to-artery bypass graft in 2002 with  a right fifth toe amputation which healed.  He subsequently had repair of an  abdominal aortic aneurysm in May of 2003 for an enlarging abdominal aortic  aneurysm.  On routine follow up revealed that he was noted to have a vein  graft stenosis in the distal aspect of his right fem-pop bypass graft.  He  underwent diagnostic arteriography which showed a very tight 95% stenosis in  the distal vein graft and he is brought in for elective revision of his  bypass graft.   PAST MEDICAL HISTORY:  Significant for hypertension, previous history of  tobacco use, history of back surgery.  He denies any history of diabetes,  hypercholesterolemia, history of previous myocardial infarction or history  of congestive heart failure.   FAMILY HISTORY:  Mother is deceased from a stroke, father is deceased from a  myocardial infarction at age 59.  He is not aware of any other history of  premature cardiovascular disease.   SOCIAL HISTORY:  He has quit tobacco although he had a 60-pack-year history  of smoking in the past.  He is married.   ALLERGIES:  CONTRAST DYE.   MEDICATIONS:  1.  Propylthiouracil 50 mg 3 tabs p.o. b.i.d.  2.  Atenolol chlorthalidone 25 mg p.o. daily.  3.  Plavix 75 mg p.o. daily.  This was held on October 07, 2004 and he      began enteric-coated aspirin at 325  mg at that time.  4.  Multivitamin 1 p.o. daily.  5.  Vytorin 40 mg p.o. daily.   REVIEW OF SYSTEMS:  He has had no recent chest pain, chest pressure.  PULMONARY:  He has had no bronchitis, asthma or wheezing.  GENERAL:  He has  had no weight loss, weight gain, problems with his appetite or fever.  GI:  He has had no recent change in his bowel habits and has no history of peptic  ulcer disease.  GU:  He has had no dysuria or frequency.  The remainder of  his review of systems is unremarkable.   PHYSICAL EXAMINATION:  VITAL SIGNS:  Blood pressure is 141/89, heart rate is  75, temperature 97, saturation 95%.  HEENT:  There is no cervical lymphadenopathy.  I do not detect any carotid  bruits.  LUNGS:  Clear bilaterally to auscultation.  HEART:  On cardiac exam, he has a regular rate and rhythm.  He has palpable  femoral pulses and  a palpable right popliteal pulse.  I could not palpate  distal pulses on either side.  Both feet appear adequately perfused.  SKIN:  He has no ischemic ulcers.   Arteriogram showed a tight 95% distal vein graft stenosis on the right.  His  aorto right iliac left femoral graft was widely patent.   IMPRESSION:  This patient presents for elective revision of his right fem-  pop bypass graft.  We have discussed the indications for the procedure and  the potential complications including but not limited to bleeding, wound  healing problems and graft thrombosis.  All of his questions are answered  and he is agreeable to proceed.      Chri   CSD/MEDQ  D:  09/29/2004  T:  09/29/2004  Job:  [id number]

## 2011-03-27 NOTE — Discharge Summary (Signed)
NAME:  Dylan Knight, Dylan Knight               ACCOUNT NO.:  [id number]   MEDICAL RECORD NO.:  [id number]          PATIENT TYPE:  INP   LOCATION:  6713                         FACILITY:  [facility]   PHYSICIAN:  Elliot Cousin, M.D.    DATE OF BIRTH:  11/13/1923   DATE OF ADMISSION:  08/30/2005  DATE OF DISCHARGE:  09/02/2005                                 DISCHARGE SUMMARY   DISCHARGE DIAGNOSES:  1.  Left lower lobe pneumonia.  2.  Hypokalemia.   DISCHARGE DIAGNOSES:  1.  Hyperthyroidism.  2.  Peripheral vascular disease.  3.  Sleep apnea, just started C-PAP at home.  4.  Prediabetes/diet-controlled diabetes mellitus.  5.  History of abdominal aortic aneurysm.  6.  Status post stroke with transient right hemiplegia following physical      therapy; the patient only has mild right hemiparesis.  7.  Degenerative joint disease.  8.  Hyperlipidemia  9.  Status post abdominal aortic aneurysm repair.  10. Status post right femoral-popliteal bypass surgery.  11. Status post diskectomies and/or laminectomies of his lumbar spine      approximately 40 years ago.   DISCHARGE MEDICATIONS:  1.  Levaquin 500 milligrams daily for five more days.  2.  Humibid L.A. 1 tablet b.i.d.  3.  Tessalon Perles 100 milligrams b.i.d.  4.  Potassium chloride 20 mEq daily for 7 days, then per recommendation of      Dr. Barbee Shropshire.  5.  PTU 50 milligrams b.i.d.  6.  Atenolol/chlorthalidone 50/25 milligrams daily with.  7.  Plavix 75 milligrams daily.  8.  Vytorin 10/40 milligrams daily.  9.  Xanax 0.25 milligrams every 6 hours as needed.  10. Ambien 5 milligrams q.h.s. p.r.n.   DISCHARGE DISPOSITION:  The patient was discharged to home in improved and  stable condition on October25,2006. He was advised to follow up with Dr.  Barbee Shropshire as scheduled on October30, 2006.   HISTORY OF PRESENT ILLNESS:  The patient is a 77 year old man with a past  medical history significant for peripheral vascular disease, prior stroke,  and diet-controlled diabetes mellitus, who presented to the emergency  department on October22,2006 with a chief complaint of shortness of breath,  palpitations and chills. When the patient was evaluated in the emergency  department, he was febrile with a temperature of 100.9. His chest x-ray  revealed a left lower lobe pneumonia. The patient was therefore admitted for  further evaluation and management.   HOSPITAL COURSE:  Problem 1:  LEFT LOWER LOBE PNEUMONIA:  The patient was  started on antibiotic therapy with intravenous azithromycin and Rocephin.  Maintenance IV fluids were also started at 100 cc an hour. The patient was  treated with oxygen therapy at 2 liters per minute. Symptomatic treatment  was provided with Humibid LA b.i.d. and Robitussin DM every 4 hours as  needed for cough. The patient was also treated with albuterol and Atrovent  nebulizers during the hospital course. Following antibiotic therapy, the  patient remained afebrile during the hospital course. He began feeling  better, less symptomatic with regards to shortness of breath and fever and  chills. He did have a dry cough but was unable to expectorate.  After 48  hours of antibiotic therapy, the patient began walking in his room without  much shortness of breath. Following three days of therapy, the patient was  ready to go home. At the time of hospital discharge, he was oxygenating 96%  on room air. He received three full doses of azithromycin and Rocephin  intravenously. At the time of hospital discharge, these antibiotics were  discontinued and the patient was sent home on Levaquin 500 milligrams daily  for an additional five days. As stated above, he was also discharged home on  Humibid and Tessalon Perles for cough.   Problem 2:  HYPOKALEMIA:  The patient's serum potassium on admission was low  at 3.2. Following IV fluid administration, his serum potassium fell to 2.8  the following day. The patient was  therefore repleted with potassium  chloride intravenously and by mouth. A magnesium level was assessed and  found to be within normal limits at 1.6. The hypokalemia was thought to be  secondary to HCTZ which was prescribed with atenolol for blood pressure  control. The patient takes atenolol/chlorthalidone chronically as an  outpatient. He was discharged home on potassium chloride 20 mEq once daily  for an additional 7 days. Further management per Dr. Barbee Shropshire. The patient's  serum potassium was 3.4 prior to hospital discharge. He will need another  BMET  drawn at his hospital follow-up appointment on October30, 2006.   The patient's other chronic medical conditions were stable during the  hospital course. His hemoglobin A1c was 6.9. His TSH was within normal  limits at 0.827.      Elliot Cousin, M.D.  Electronically Signed     DF/MEDQ  D:  09/03/2005  T:  09/03/2005  Job:  [id number]   cc:   Olene Craven, M.D.  Fax: [phone]

## 2011-03-27 NOTE — H&P (Signed)
[place]. [facility]  Patient:    Dylan Knight, Dylan Knight Visit Number: [id number] MRN: [id number]          Service Type: DSU Location: [facility] 2899 14 Attending Physician:  Bennye Alm Dictated by:   Dominica Severin, P.A. Admit Date:  03/13/2002 Discharge Date: 03/13/2002   CC:         Alvia Grove., M.D.   History and Physical  DATE OF BIRTH:  12/16/1923  CARDIOLOGIST:  Alvia Grove., M.D.  CHIEF COMPLAINT:  Abdominal aortic aneurysm.  HISTORY OF PRESENT ILLNESS:  This is a 77 year old Caucasian male who is known to CVTS after undergoing a right femoral to above-knee popliteal bypass in 2002.  At that time he was found to have an abdominal aortic aneurysm which has been followed.  It has increased in size.  He was scheduled for an angiogram on Mar 13, 2002.  He tolerated that procedure well.  He was found to need a revision and grafting of the abdominal aortic aneurysm and possible bilateral renal artery bypass graft surgery and inferior mesenteric artery reimplantation.  PAST MEDICAL HISTORY: 1. Peripheral vascular disease. 2. History of hypertension, currently not on medications. 3. Tobacco abuse. 4. History of daily alcohol abuse.  PAST SURGICAL HISTORY: 1. Right leg femoral to above-knee popliteal bypass graft in 2002 along with a    right fifth toe amputation. 2. Back surgery in 1960 and again in 1998. 3. Neck surgery in 1990. 4. Right shoulder surgery.  FAMILY HISTORY:  Mother is deceased of a stroke and father is deceased of a myocardial infarction at 75 years old.  The patient had two brothers - one passed away of cancer, the other passed away of asbestosis.  SOCIAL HISTORY:  The patient has a 62 pack-year history.  He also has a history of heavy alcohol use although currently he drinks six beers a week for approximately 60 years.  Denies any IV drug use or other illicit drug use. Marital status:  He is  apparently married for seven years.  He has four children from his first marriage and has two stepchildren.  Occupation:  He is retired, used to work for the [employer] in [place] [employer].  MEDICATIONS PRIOR TO ADMISSION:  None.  ALLERGIES:  CONTRAST DYE causing a rash and pruritus.  REVIEW OF SYSTEMS:  GENERAL:  Patient denies any fever, chills, increased fatigue, or any unexplained weight loss or weight gain.  HEENT:  He denies any impaired vision.  He wears glasses.  Denies any history of glaucoma or cataract problems.  Denies any impaired hearing.  Does not wear dentures. PULMONARY:  Patient denies any history of lung disease, asthma, COPD, tuberculosis, recent upper respiratory infection, or cough.  Denies any shortness of breath or dyspnea on exertion.  CARDIAC:  Patient denies any history of arrhythmias, hypertension, heart murmurs, congestive heart failure, coronary artery disease, myocardial infarction, or chest pain.  He did have a history of hypertension in the past but he is not treated now. GASTROINTESTINAL:  Patient denies any history of hiatal hernia or gastroesophageal reflux disease, ulcers, hepatitis, jaundice, constipation, hemorrhoids, or any other GI bleeding.  GENITOURINARY AND RENAL:  The patient denies any frequency, urgency, incontinence, hematuria, nocturia.  Denies any history of renal disease.  Denies any history of diabetes or thyroid disease. Denies any history of pancreatitis.  CIRCULATION:  Patient has a history of peripheral vascular disease status post a right femoral to above-knee popliteal  bypass in 2002 as well as a right toe amputation.  His last ABIs are 0.86 on the left side.  Otherwise, denies any other circulatory problems. SKIN:  Denies any rashes, wounds, ulcers.  NEUROLOGIC:  Patient denies any history of headaches, seizures, falling, tumors, stroke or TIAs, or any extremity weakness or numbness or amaurosis fugax.  PHYSICAL  EXAMINATION:  VITAL SIGNS:  Blood pressure 146/89, pulse 91, respirations 20, temperature 98.  Weight 130, height 5 feet 6 inches.  GENERAL:  This is a 77 year old Caucasian male who is in no acute distress who is alert and oriented x3, lying down in the bed after an angiogram.  HEENT:  Head is normocephalic, atraumatic.  Eyes are PERRLA/EOMI.  Nose is patent, throat is clear.  NECK:  Supple without JVD, bruits, lymphadenopathy, or thyromegaly.  CHEST:  Clear to auscultation bilaterally and anteriorly.  HEART:  Regular rate and rhythm without murmurs, gallops, or rubs.  Heart sounds are distant.  ABDOMEN:  With positive bowel sounds x4 quadrants.  It is soft, nontender, without hepatosplenomegaly, bruits, or masses.  EXTREMITIES:  He has good Doppler signals on his dorsalis pedis bilaterally. There is no edema.  His right lower extremity demonstrates well healed wound incisions.  NEUROLOGIC:  Patient is alert and oriented x3.  His cranial nerves II-XII are grossly intact.  LABORATORY DATA:  For labs and chest x-rays please see chart for specifics.  IMPRESSION:  Abdominal aortic aneurysm with bilateral renal artery stenosis.  PLAN:  The patient is to undergo revision and grafting of abdominal aortic aneurysm with possible bilateral renal artery bypass graft and reimplantation of inferior mesenteric artery on Mar 16, 2002 by Dr. Edilia Bo.  Dr. Edilia Bo has seen and examined this patient prior to this admission and has explained the benefits of the risks of the procedure stated above and the patient has agreed to continue. Dictated by:   Dominica Severin, P.A. Attending Physician:  Bennye Alm DD:  03/13/02 TD:  03/13/02 Job: [id number]

## 2011-03-27 NOTE — Discharge Summary (Signed)
[place]. [facility]  Patient:    YISRAEL, OBRYAN Visit Number: [id number] MRN: [id number]          Service Type: SUR Location: 2000 2024 01 Attending Physician:  Bennye Alm Dictated by:   Loura Pardon, P.A. Admit Date:  08/25/2001 Discharge Date: 08/29/2001   CC:         Kern Reap, M.D.  Veverly Fells. Ophelia Charter, M.D.   Discharge Summary  DATE OF BIRTH:  1923/12/15  DISCHARGE DIAGNOSES: 1. 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 2. Bilateral lower extremity claudication. 3. 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 4. Paroxysmal supraventricular tachycardia postoperatively, especially with    exertion resolving after removal of transdermal Nicoderm patch and    treatment with Cardizem.  SECONDARY DIAGNOSES: 1. Hypertension. 2. Benign prostatic hypertrophy. 3. Small, 4.1 cm, abdominal aortic aneurysm. 4. History of tobacco habituation. 5. History of daily ethanol use. 6. Status post back surgery in 1960 and 1998. 7. Status post cervical spine surgery in 1990.  PROCEDURE:  August 25, 2001 right femoral to above-knee popliteal artery bypass with placement of right greater saphenous vein nonreversed translocated as conduit.  Ligation of the right popliteal artery proximal to the distal bypassed anastomosis and right fifth toe amputation; Dr. Waverly Ferrari, surgeon.  The patient has tolerated the procedure well.  He has remained stable in the postoperative period and is ready to go home on postoperative day #4.  DISCHARGE DISPOSITION:  In the immediate postoperative period and continuing throughout his convalescence, he has had a palpable right popliteal pulse with brisk Doppler signals in the right foot.  His postoperative hemoglobin was 12.8, hematocrit 37.6.  Creatinine was 0.9.  His mental  status has remained clear in the postoperative period.  He has been afebrile.  His pain is controlled with oral analgesic medications.  He had two JP drains placed in surgery.  Both were removed successfully on postoperative day #1 with very little drainage, which was mostly serous.  Ankle brachial indexes were obtained on postoperative day #1.  On the right, they had improved from 0.76 to 0.81.  On the left, they remained essentially the same from 0.79 to 0.81. In the postoperative period, his heart rate has been elevated, especially with exertion.  He has had bursts of paroxysmal supraventricular tachycardia, as much as to rates of 150s.  These are asymptomatic.  He has also had elevated blood pressure, although he states in the past that most of the blood pressure medications tried on him produced adverse results.  He was started on Lopressor postoperative day #2 and continued to have bursts of PSVT, which continued into postoperative day #3.  His Nicoderm transdermal patch, which had been placed daily after surgery, was discontinued and he was changed from Lopressor to Cardizem.  In addition, the incisions on the right lower extremity are all healing nicely.  There is no evidence of abnormal drainage or erythema.  He has received daily dressing changes, especially in the right groin and to the right fifth toe amputation site.  DISCHARGE MEDICATIONS: 1. Percocet 5/325 one to two tablets p.o. q.4-6h. 2. Cardizem 120 mg daily.  DISCHARGE ACTIVITY:  Ambulate as tolerated.  He is to use the Darco boot when walking on his right foot.  He is not to drive until he sees Dr. Edilia Bo in the office.  DISCHARGE  DIET:  Low-sodium/low-cholesterol diet.  WOUND CARE:  He is to place a dry gauze on the right groin every morning and a 2 x 2 gauze in the fifth toe amputation site daily, and then to wrap the right foot with Kerlix for protection.  He will sponge bathe until Wednesday, October 23, and  he may begin showering then if he feels stable enough on his feet.  He will see Dr. Edilia Bo in the office Wednesday, October 30. Dr. Quillian Quince office will call with his time.  BRIEF HISTORY:  Mr. Hink is a 77 year old male.  He was referred by Dr. Ophelia Charter for evaluation of infrainguinal arterial occlusive disease.  The patient presents with a 4-5 year history of bilateral lower extremity claudication affecting the hips, thighs, and calf.  He also has right foot pain, which is now present at rest.  An arteriogram was obtained August 08, 2001.  This showed aortoiliac occlusive disease with disease affecting both hypogastric arteries.  Also, the right proximal superficial femoral artery.  Both tibial vessels show evidence of embolic events.  Doppler studies obtained at the office of [facility] prior to the surgery show an ankle brachial index of 0.76 on the right and ankle brachial index of 0.79 on the left.  Dr. Edilia Bo recommended proceeding with a right femoral to above-knee popliteal bypass graft using a nonreversed translocated greater saphenous vein as conduit.  HOSPITAL COURSE:  Hospital course is as described in the discharge disposition.  Mr. Oak has remained at [facility] recuperating from his surgery for four days.  As mentioned, he has been afebrile.  His mental status has been clear.  He is ambulating independently. His wounds are healing nicely.  His cardiac rhythm has been treated with Cardizem. Dictated by:   Loura Pardon, P.A. Attending Physician:  Bennye Alm DD:  08/28/01 TD:  08/30/01 Job: [id number]

## 2011-03-27 NOTE — H&P (Signed)
[place]. [facility]  Patient:    Dylan Knight, Dylan Knight Visit Number: [id number] MRN: [id number]          Service Type: Attending:  Di Knight. Dylan Knight, M.D. Dictated by:   Dylan Knight, P.A.-C. Adm. Date:  08/25/01   CC:         Dylan Knight, M.D. [facility] Dylan Knight, M.D.   History and Physical  DATE OF BIRTH:  1924/04/02  CHIEF COMPLAINT:  PVOD.  HISTORY OF PRESENT ILLNESS:  [name] is a pleasant, 77 year old, white male referred by Dr. 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  The patient presented with four- to five-year history of bilateral lower extremity claudication of the hips, thighs and calf as well as right foot pain, now present at rest.  An arteriogram on August 08, 2001, showed AIOD with disease in both hypogastric arteries, right proximal SFA OD and focal stenosis in the proximal SFA.  In both tibial vessels, it appeared that the patient had previous embolic event.  Dopplers in office showed right ABI of 0.76 with left ABI of 0.79.  Based on the findings, Dr. Edilia Knight recommends to proceed with right femoral popliteal bypass graft to prevent further embolization.  PAST MEDICAL HISTORY: 1. PVOD. 2. Hypertension, not controlled by medications. 3. BPH. 4. Small AAA of 4.1 cm. 5. History of tobacco abuse. 6. History of daily alcohol use.  PAST SURGICAL HISTORY: 1. Status post "mid back" surgery in 1960. 2. Status post cervical surgery in 1990. 3. Status post lower back surgery in 1998.  MEDICATION:  Percocet 7.5/500 one or two p.o. q.4-6h. p.r.n. pain.  ALLERGY:  IVP DYE causes what looks like severe rash with "peeling of the skin."  REVIEW OF SYSTEMS:  HEENT:  Negative.  Pulmonary: No shortness of breath, no shortness of breath after exertion, no cough or sputum production.  No hemoptysis.  Cardiac: No chest pain with no symptoms of congestive heart failure.  No MI.  The patient denies hypercholesterolemia.  GI:  Negative. GU: Negative.  Renal: Negative.  Endocrine: Negative.  Musculoskeletal: No osteoarthritis, otherwise negative.  Neurologic: Other than mild resting tremor caused by, according to the patient, anxiety, this is essentially negative.  Vascular: The patient complains of right buttock pain, hip and thigh pain.  He also has right calf pain and foot pain.  This pain is present at rest and at night.  Since August 16, 2001, the patient has developed a slow healing ulcer in the right medial portion of the fifth toe.  He also has ischemic changes in the right great toe.  He also complains of significant swelling in the same foot.  No decrease in temperature.  FAMILY HISTORY:  Mother died of stroke at 60.  Father died at 9 of MI.  Two brothers died of cancer.  SOCIAL HISTORY:  Married with four children.  He is retired and smokes one pack a day x 60 years.  He drinks more than two to three beers per day.  PHYSICAL EXAMINATION:  GENERAL:  This is a well-developed, somewhat debilitated in moderate degree of pain, especially if lying down, 77 year old, white male alert and oriented x 3.  Blood pressure elevated at 180/100, pulse 100, respiratory rate 18.  HEENT:  Normocephalic, atraumatic.  PERRLA.  EOMI.  Bilateral cataracts.  No glaucoma or macular degeneration.  NECK:  Supple, no JVD.  There is a very soft right bruit.  No lymphadenopathy.  CHEST:  Symmetrical on inspiration.  LUNGS:  No wheezes.  There is a trace of anterior rhonchi, cleared with cough. No rales or lymphadenopathy.  CARDIOVASCULAR:  Regular rate and rhythm with no murmurs, rubs or gallops.  ABDOMEN:  Soft, nontender, bowel sounds x 4 with no masses.  There is 1+ bruit in the abdomen.  GU/RECTAL:  Deferred.  EXTREMITIES:  No clubbing.  On the right first and fifth toes, there is significant cyanosis, possibly due to an embolic event in that area.  He has edema in the right dorsal aspect of the foot.  He also  has in the fifth toe in the medial portion, a superficial ulcer with mild serosanguineous drainage, which according to the patient, has been present since August 16, 2001.  He has bilateral varicose veins present.  Peripheral pulses with carotid and femoral 2+ bilaterally, popliteal 2+ bilaterally, dorsalis pedis and posterior tibialis 1+ on the left, absent on the right.  NEUROLOGIC:  Other than mild resting tremor is nonfocal.  Gait steady.  DTRs 2+ bilaterally with muscle strength 5/5.  ASSESSMENT/PLAN:  Peripheral vascular occlusive disease for right femoral popliteal bypass graft on August 25, 2001, by Dr. Edilia Knight.  Dr. Edilia Knight has seen and evaluated this patient prior to the admission and has explained the risks and benefits involving the procedure and the patient has agreed to continue. Dictated by:   Dylan Knight, P.A.-C. Attending:  Di Knight. Dylan Knight, M.D. DD:  08/22/01 TD:  08/22/01 Job: [id number]

## 2011-03-27 NOTE — Op Note (Signed)
NAME:  Dylan Knight, Dylan Knight               ACCOUNT NO.:  [id number]   MEDICAL RECORD NO.:  [id number]          PATIENT TYPE:  AMB   LOCATION:  DSC                          FACILITY:  [facility]   PHYSICIAN:  Cindee Salt, M.D.       DATE OF BIRTH:  1924-01-07   DATE OF PROCEDURE:  11/25/2006  DATE OF DISCHARGE:                               OPERATIVE REPORT   PREOPERATIVE DIAGNOSIS:  Stenosing tenosynovitis right middle, right  ring finger; carpal tunnel syndrome, right hand.   POSTOPERATIVE DIAGNOSIS:  Stenosing tenosynovitis right middle, right  ring finger; carpal tunnel syndrome, right hand.   OPERATION:  Release A1 pulley, right middle, right ring finger.  Carpal  tunnel release, right hand.   SURGEON:  Kuzma.   ASSISTANT:  Carolyne Fiscal R.N.   ANESTHESIA:  Forearm-based IV regional.   HISTORY:  The patient is an 77 year old male with a history of  triggering his right middle, right ring finger not responsive to  conservative treatment.  He also has carpal tunnel syndrome.  EMG nerve  conductions positive.  Unresponsive to conservative treatment.  He is  desirous of proceeding to have these released.  He is aware of risks and  complications including infection, recurrence, injury to arteries,  nerves, tendons, incomplete relief of symptoms, dystrophy, triggering of  the fingers despite release.  In the preoperative area, the patient is  seen, questions encouraged and answered.  The extremity marked by both  the patient and surgeon.   PROCEDURE:  The patient was brought to the operating room where a  forearm-based IV regional anesthetic was carried out without difficulty.  Was prepped using DuraPrep, supine position, right arm free.  A straight  incision was made longitudinally in the palm and carried down through  subcutaneous tissue.  Bleeders were electrocauterized.  Palmar fascia  was split, superficial palmar arch identified, flexor tendon in the ring  and little finger identified to the  ulnar side of median nerve.  Carpal  retinaculum was incised with sharp dissection.  Right angle and Sewall  retractor were placed between skin and forearm fascia.  The fascia was  released for approximately a centimeter and half proximal to the wrist  crease under direct vision.  Large lumbrical muscles were apparent.  These were not removed.  No further lesions were identified.  The wound  was irrigated.  Skin closed with interrupted 5-0 nylon sutures.  A  separate incision was then made over the right middle, right ring  finger, carried down through subcutaneous tissue.  Each one of these  were separately done.  The neurovascular bundles were identified  proximally, radially and ulnarly.  The A1 pulley was then released on  the radial aspect.  A small incision made centrally in the A2 pulley.  Finger was placed through a full range motion, further triggering was  identified.  The ring finger was performed in a similar manner to the  middle finger.  The wounds were irrigated.  Skin closed with interrupted  5-0 nylon sutures.  A sterile  compressive dressing and splint was applied.  The patient tolerated  the  procedure well and was taken to the recovery room for observation in  satisfactory condition.  His fingers are left free to allow mobility.  He will return to the office in 1 week.           ______________________________  Cindee Salt, M.D.     GK/MEDQ  D:  11/25/2006  T:  11/25/2006  Job:  [id number]   cc:   Cindee Salt, M.D.  Olene Craven, M.D.

## 2011-03-27 NOTE — Op Note (Signed)
[place]  Patient:    Dylan Knight, Dylan Knight Visit Number: [id number] MRN: [id number]          Service Type: DSU Location: [facility] 2899 14 Attending Physician:  Bennye Alm Dictated by:   Di Kindle Edilia Bo, M.D. Proc. Date: 03/13/02 Admit Date:  03/13/2002 Discharge Date: 03/13/2002   CC:         Mary Sella, MD  Alvia Grove., M.D.   Operative Report  PREOPERATIVE DIAGNOSIS:  Abdominal aortic aneurysm.  POSTOPERATIVE DIAGNOSIS:  Abdominal aortic aneurysm.  OPERATION PERFORMED: 1. Aortogram. 2. Bilateral iliac arteriogram. 3. Bilateral lower extremity run-off.  SURGEON:  Di Kindle. Edilia Bo, M.D.  INDICATIONS FOR PROCEDURE:  The patient is a 77 year old gentleman whom I have been following with an abdominal aortic aneurysm.  This enlarged to 5 cm and therefore he is brought in for outpatient diagnostic arteriography in order to plan elective repair.  The procedure, potential complications including arterial injury, bleeding, kidney failure, and dye reaction were discussed with the patient preoperatively and all his questions were answered.  ANESTHESIA:  DESCRIPTION OF PROCEDURE:  The patient was taken to the peripheral vascular cath lab at [facility] and sedated with 1 mg of Versed and 50 mcg of fentanyl.  The left groin was prepped and draped in the usual sterile fashion.  The patient had just been cathed on the right side for cardiac catheterization.  After the skin was anesthetized with 1% lidocaine, the left common femoral artery was cannulated and a guide wire introduced into the infrarenal aorta under fluoroscopic control.  A 5 French sheath was passed over the wire.  Next, the dilator was removed and a pigtail catheter was positioned at the L1 vertebral body.  A flush aortogram was obtained.  The catheter was then pulled down to prevent flow into the mesenterics to better visualize the renals and  an additional AP projection was obtained.  A lateral projection was then obtained and then the catheter was brought down above the iliac bifurcation and bilateral oblique iliac projections were obtained.  Next, bilateral lower extremity run-off film was obtained.  Of note, there was limited flow through the left as there was a stenosis on the left iliac system with limited flow around the catheter.  Therefore, the catheter was removed over the wire and an additional left lower extremity film obtained through the left femoral sheath.  FINDINGS:  The main  renal arteries on both sides are patent.  There was an accessory renal artery on both sides.  The accessory renal artery on the left originates slightly superior to the accessory renal artery on the right.  The superior mesenteric artery has brisk flow throughout.  Of note, there is a medium-sized collateral which appears which originates from above, probably from a branch of the celiac or SMA which fills the pelvis some.  There is a small aneurysm in the distal common iliac artery on the right and also on the distal common iliac artery on the left.  There are two tandem stenoses in the external iliac artery on the left side.  Once is almost occluded by the catheter.  Lateral projection showed evidence of some celiac access compression.  This produced some narrowing of the proximal celiac access.  The superior mesenteric artery is widely patent.  The inferior mesenteric artery is not visualized on the AP or lateral projection.  Oblique projections of the iliacs again do not show that the hypogastric arteries appear to  be occluded bilaterally.  There is some collateral filling of these through profunda collaterals on the right and through the collateral from above on the left and also through a lumbar on the left side.  On the right side the external iliac artery is widely patent.  The superficial femoral artery is occluded at its origin.   The deep femoral artery is patent. There is a femoral-popliteal bypass graft which is anastomosed to the above knee popliteal artery.  There is some moderate narrowing at the very distal segment of the graft producing approximately 50% narrowing.  The popliteal artery is widely patent.  The proximal tibial vessels are patent.  The posterior tibial artery is occluded in the midcalf and there is distal disease in both the peroneal and anterior tibial arteries on the right.  The patient apparently has a history of embolic disease.  On the left side the common femoral, deep femoral and superficial femoral arteries are patent.  There is moderate diffuse disease of the left superficial femoral artery.  The popliteal artery is patent.  The posterior tibial artery is occluded on the left.  The anterior and peroneal arteries are patent.  CONCLUSIONS:  The patient has an abdominal aortic aneurysm.  The size cannot be accurately determined by the study.  There also are small bilateral common iliac artery aneurysms.  There are accessory renal arteries bilaterally.  Both hypogastric arteries and the IMA are occluded.  There is an occluded superficial femoral artery on the right with a patent femoral-popliteal bypass graft with a distal vein graft stenosis of approximately 50%.  The patient has tibial occlusive disease bilaterally as described above.  transferred to the recovery room in satisfactory condition.  All needle and sponge counts were correct. Dictated by:   Di Kindle Edilia Bo, M.D. Attending Physician:  Bennye Alm DD:  03/13/02 TD:  03/14/02 Job: [id number]

## 2011-03-27 NOTE — Op Note (Signed)
[place]  Patient:    Dylan Knight, Dylan Knight Visit Number: [id number] MRN: [id number]          Service Type: SUR Location: 3300 3315 01 Attending Physician:  Bennye Alm Dictated by:   Di Kindle. Edilia Bo, M.D. Proc. Date: 08/25/01 Admit Date:  08/25/2001   CC:         Laurita Quint, M.D. [facility], [address] 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  PROCEDURES: 1. Right femoral to above-knee popliteal artery bypass with a nonreversed,    translocated saphenous vein graft. 2. Intraoperative arteriogram. 3. Right fifth toe amputation.  SURGEON:  Di Kindle. Edilia Bo, M.D.  ASSISTANT:  Loura Pardon, P.A.  ANESTHESIA:  General.  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  In addition, he was found to have a small 4 cm abdominal aortic aneurysm, and he had some mild iliac artery occlusive disease bilaterally.  His arteriogram showed a tight, irregular stenosis in the superficial femoral artery on the right, and it was felt that this was most likely a source for embolic disease to his right foot.  He had developed an ischemic right fifth toe, which was quite painful.  Right femoral to above-knee popliteal artery bypass graft was recommended in order to prevent further embolization.  In addition, the ischemic toe had progressed and when I saw him in the holding area prior to his surgery, I recommended that this be amputated for both pain control and as the toe had become quite ischemic.  I did not think this would heal.  I discussed this  with both him and his family.  The procedure and potential complications were discussed with the patient in detail preoperatively.  DESCRIPTION OF PROCEDURE:  The patient was taken to the operating room, received a general anesthetic.  The entire right lower extremity and right foot were prepped and draped in the usual sterile fashion.  After the longitudinal incision was made in the right groin, the common femoral, superficial femoral, and deep femoral arteries were dissected free and controlled with vessel loops.  Of note, the artery was somewhat thick but did have an excellent pulse at this level.  Through this same incision, the saphenofemoral junction was dissected free and the vein was then harvested through two additional incisions along the medial aspect of the right leg to the level of the knee.  Branches were divided between clips and 3-0 silk ties. Through the distal incision, the above-knee popliteal artery was exposed and, gain, this was slightly thick, however patent and amenable to bypass.  The vein was then removed in its entirety.  A tunnel was created from the below-knee popliteal artery to the groin incision, and then the patient was heparinized.  The saphenofemoral junction was then clamped and then the saphenous vein excised from the femoral vein.  The vein was oversewn with a 5-0 Prolene suture.  A proximal valve was sharply excised.  The patient was then heparinized.  The common femoral, superficial femoral, and deep femoral arteries were controlled and then  a longitudinal arteriotomy was made in the distal common femoral artery.  The vein was sewn in a nonreversed fashion end-to-side to the common femoral artery using continuous 6-0 Prolene suture. Prior to completing the anastomosis, the vessels were backbled and flushed appropriately and then there was excellent inflow.  The anastomosis was then completed.  A radiopaque marker was then placed around the  anastomosis for future reference if the patient required an arteriogram or thrombolysis.  The graft was then pulled to the appropriate length for anastomosis to the below-knee popliteal artery.  It had been marked and then the valves had been lysed with a retrograde Mills valvulotome.  It was then brought through the previously-created tunnel.  A tourniquet was placed on the thigh, the leg exsanguinated with an Esmarch bandage, and the tourniquet inflated to 250 mmHg.  Under tourniquet control a longitudinal arteriotomy was made in the above-knee popliteal artery, and the vein was cut to the appropriate length, spatulated, and sewn end-to-side to the above-knee popliteal artery using two continuous 6-0 Prolene sutures.  Prior to completing the anastomosis, the vessels were backbled and flushed appropriately and the anasotmosis completed. Flow was re-established to the right leg.  Of note, I did ligate the above-knee popliteal artery proximal to the distal anastomosis to prevent further embolization from this irregular, tight superficial femoral artery lesion.  Intraoperative arteriogram was obtained, which showed no technical problems.  The patient had an excellent peroneal, dorsalis pedis, and posterior tibial signal with the Doppler.  Hemostasis was then obtained in the wound.  A 15 Blake drain was placed in the groin incision.  This wound was closed with a deep layer of 2-0 Vicryl.  The subcutaneous layer was then closed with 3-0 Vicryl.  The skin was closed with staples.  The other two incisions were closed with a deep layer of 3-0 Vicryl and the skin closed with staples.  Of note, a 15 Blake drain was also placed in the distal incision. These wounds were then covered and then a right fifth toe amputation was performed.  An elliptical incision was made encompassing the toe, and the bone was then divided and debrided back to healthy bone, and then hemostasis was obtained.  The wound was  closed with two interrupted 3-0 nylon sutures.  A sterile dressing was applied.  The patient tolerated the procedure well and  was transferred to the recovery room in satisfactory condition.  All needle and sponge counts were correct. Dictated by:   Di Kindle Edilia Bo, M.D. Attending Physician:  Bennye Alm DD:  08/25/01 TD:  08/26/01 Job: [id number]

## 2011-03-27 NOTE — H&P (Signed)
NAME:  Dylan Knight, Dylan Knight               ACCOUNT NO.:  [id number]   MEDICAL RECORD NO.:  [id number]          PATIENT TYPE:  INP   LOCATION:  1823                         FACILITY:  [facility]   PHYSICIAN:  [name], M.D.DATE OF BIRTH:  06-18-1924   DATE OF ADMISSION:  08/30/2005  DATE OF DISCHARGE:                                HISTORY & PHYSICAL   PRIMARY CARE PHYSICIAN:  Dr. Kern Reap.   CHIEF COMPLAINT:  Chills, fever, and shortness of breath.   HISTORY OF PRESENT ILLNESS:  The patient is a pleasant 77 year old gentleman  with a history of peripheral vascular disease, prior stroke and recently  diagnosed noninsulin-dependent diabetes, borderline glucose intolerance who  over the past 12 to 18 hours has developed an increasing shortness of breath  and some palpitations and chills. He had been in his usual state of health  until these symptoms began. He reports no nausea, vomiting, headache, or  decrease in appetite. He had gone with his wife and some family and friends  on an outing a few days ago where they were in close proximity with other  people but he is unaware if any one else had similar symptoms currently. He  reports no weakness, no chest pain, no current shortness of breath.   PAST MEDICAL HISTORY:  1.  As noted significant for hypothyroidism.  2.  Peripheral vascular disease.  3.  Recently diagnosed glucose intolerance diet controlled.  4.  History of abdominal aortic aneurysm.  5.  History of degenerative joint disease in lower back.  6.  History of sleep apnea. He does have a C-PAP machine but has not been      currently using it. He is just learning how to use it.  7.  He had a stroke about a year and a half ago with transient right      hemiplegia which resolved with intense physical therapy.  8.  Dyslipidemia.   PAST SURGICAL HISTORY:  1.  He has had a AAA repair.  2.  A right femoral-popliteal bypass repair.  3.  He has had several low back  surgeries, discectomies in the remote past      greater than 40 years ago.   SOCIAL HISTORY:  He is married. He has four grown children. He does not  smoke or drink. He is active in his community and church. 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 He used to work in  [place].   REVIEW OF SYSTEMS:  Per the HPI.   FAMILY HISTORY:  Mother died of a stroke at a young age. Father died of  coronary artery disease, probable MI at age 65.   MEDICATIONS:  1.  Propylthiouracil 50 mg p.o. b.i.d.  2.  Atenolol/chlorthalidone combination 50/25 mg 1 p.o. daily.  3.  Plavix 75 mg p.o. daily.  4.  Vytorin 10/40 mg 1 p.o. daily.  5.  Xanax 0.25 mg p.o. q.6h. p.r.n.  6.  Ambien 5 mg p.o. q.h.s. p.r.n.   ALLERGIES:  He has allergy to IVP DYE.   PHYSICAL EXAMINATION:  He is in no acute distress. Temperature is 100.9,  blood pressure 116/72, pulse 109 with O2 saturation of 92% on two liters.  Lungs are clear to auscultation except for some slight crackles in the left  lower base. Heart rate is regular with slightly tachycardic with normal S1  and S2. Neck is supple. There is no lymphadenopathy. Oropharynx is clear.  Head is normocephalic and atraumatic. Pupils are equal and reactive. Abdomen  is soft and nondistended with active bowel sounds. He has 5/5 grip strength,  2+ femoral pulses, 2+ dorsalis pedis pulses. No crepitus in his knees. No  peripheral edema is noted. He moves all four extremities. He is alert and  oriented to person, place, and time.   LABORATORY DATA:  Chest x-ray shows left lower lobe pneumonia. EKG shows  sinus tachycardia, no significant ST-T wave abnormalities.   Sodium 136, potassium 3.2, BUN 17, glucose 138, chloride 101, creatinine  1.4. White count 10.6 thousand with an absolute neutrophil count of 9.5  thousand, platelets of 197,000.   ASSESSMENT:  A 77 year old with a history of peripheral vascular disease and  insulin-dependent  diabetes who presents now with probable community-acquired  pneumonia with slight hypoxia and fever. At this time, we will admit the  patient for antibiotics. We will resume his home medications and also  continue IV hydration. We will repeat a chest x-ray in one to two days to  see if there is interval improvement. Cover him with sliding scale insulin  as well. Supplement oxygen as needed as well. Also check a TSH. If the  patient does require overnight oxygenation with the C-PAP machine, we will  ask him to bring his home machine in if that is needed. If not, we will just  supplement him with oxygen and I will follow clinically.      [name], M.D.  Electronically Signed     RLK/MEDQ  D:  08/31/2005  T:  08/31/2005  Job:  [id number]   cc:   Olene Craven, M.D.  Fax: [phone]

## 2011-03-27 NOTE — Discharge Summary (Signed)
[place]. [facility]  Patient:    Dylan Knight, Dylan Knight Visit Number: [id number] MRN: [id number]          Service Type: SUR Location: 2000 2030 01 Attending Physician:  Bennye Alm Dictated by:   Dominica Severin, P.A. Admit Date:  03/16/2002 Disc. Date: 03/20/02   CC:         Alvia Grove., M.D.  Kern Reap, M.D.   Discharge Summary  DATE OF BIRTH:  09-08-1924.  PRIMARY ADMISSION DIAGNOSIS:  Abdominal aortic aneurysm.  SECONDARY DIAGNOSES/PAST MEDICAL HISTORY: 1. Peripheral vascular disease. 2. History of hypertension. 3. Tobacco abuse. 4. History of daily alcohol abuse.  NEW DIAGNOSES/DISCHARGE DIAGNOSES: 1. Status post repair of abdominal aortic aneurysm with an aorto- to left    femoral and right external iliac artery graft. 2. Postoperative hypertension, resolved. 3. Postoperative supraventricular tachycardia, resolved.  HISTORY OF PRESENT ILLNESS:  This patient is a 77 year old male who is known to CVTS to have an abdominal aortic aneurysm, which has been followed.  He was scheduled for an angiogram on Mar 13, 2002, and was found to need a revision and grafting of his abdominal aortic aneurysm and possible bilateral renal artery bypass graft and inferior mesenteric artery reimplantation.  The patient had been asymptomatic, although this aneurysm had been enlarging and given the 5-10% risk of rupture each year, Dr. Edilia Bo recommended elective repair.  It was felt that he may need reimplant of the bilateral accessory renal arteries, which would increase the risk of renal insufficiency.  In addition, patient has bilateral hypogastric artery occlusions and is at increased risk for colon ischemia and paraplegia.  This was discussed with patient and wife, and all questions were answered and he wished to proceed with surgery.  HOSPITAL COURSE:  He underwent the surgery as stated above on Mar 16, 2002.  He tolerated the  procedure well.  Later that evening he was alert and extubated. Neurologically he was intact.  His extremities were warm.  He was on Nipride to keep his systolic blood pressure less than 170.  Hemodynamically he was stable, and his labs were good with an H&H of 15.6 and 45 and platelet count of 199.  On postop day 1, pain control was the main issue.  He was started on Toradol and his nitroglycerin was weaned and labetalol was used to help control his blood pressure.  Nevertheless, he remained stable from a hemodynamic standpoint.  He did have some postoperative hypokalemia and hypomagnesemia, and that was supplemented.  Later on postop day 1, he had gotten up.  His heart rate jumped to in the 150s.  It was controlled with labetalol.  Also later on on May 11, he had wide-complex tachycardia, a rate of 130-160.  He was asymptomatic.  He had been getting labetalol. Nevertheless on postop day 4, he was started on Lopressor to help control his blood pressure and increased heart rate.  He was tolerating his regular diet. His incisions remained clean and dry.  He had good urine output.  Laboratory work was stable.  He was anticipated for discharged the morning of Mar 21, 2002.  DISCHARGE CONDITION:  Stable.  DISCHARGE MEDICATIONS: 1. Lopressor, which is a dose to be determined at the time of discharge,    currently 12.5 mg p.o. b.i.d. 2. Nicotine patch 14 mg, change daily. 3. Tylox one or two tablets every four hours as needed for pain.  ACTIVITY AND FOLLOW-UP:  The patient is instructed not  to do any driving, heavy lifting, or strenuous activity.  He is to continue his breathing exercises and to walk daily.  He is not to do any smoking.  He is to follow a heart-healthy diet.  Told he could shower.  He is to notify the office, and the number was given, if he has any increased temperature greater than 101 or if he has any increased redness, swelling, or drainage from his incisions.  He is to  see Dr. Edilia Bo for a wound check and staple removal on Friday, May 23, at 9:30 a.m., and then he is to have ABIs and office visit with Dr. Edilia Bo on June 4 at 9 a.m.  He is also instructed to follow up with Dr. Elease Hashimoto as directed as well as Dr. Barbee Shropshire as directed for further control of his hypertension. Dictated by:   Dominica Severin, P.A. Attending Physician:  Bennye Alm DD:  03/20/02 TD:  03/22/02 Job: [id number]

## 2011-03-27 NOTE — H&P (Signed)
NAME:  MELDON, HANZLIK NO.:  [id number]   MEDICAL RECORD NO.:  [id number]           PATIENT TYPE:   LOCATION:                                 FACILITY:   PHYSICIAN:  Di Kindle. Edilia Bo, M.D.DATE OF BIRTH:   DATE OF ADMISSION:  12/03/2005  DATE OF DISCHARGE:                                HISTORY & PHYSICAL   REASON FOR ADMISSION:  Greater than 80% asymptomatic right carotid stenosis.   HISTORY:  This is a pleasant 77 year old gentleman whom I have been  following with multiple vascular issues. He had a previous right fem-pop  bypass graft and also a previous abdominal aortic aneurysm repair. He had a  stroke in the past on the left side and underwent emergent stent placement  in [place] and has done well from that standpoint. I have been  following his carotids; specifically, a moderate right carotid stenosis.  When I last saw him in June, he had a less than 80% right carotid stenosis  and was asymptomatic. He is brought in for 50-month followup visit. On a 6-  month followup visit, the right carotid stenosis had progressed  significantly and now was very tight with an end-diastolic velocity of 288  cm/sec. Given his critical right carotid stenosis, he is brought in for  elective right carotid endarterectomy in order to lower his risk of stroke.  Of note, he is asymptomatic. He denies any history of stroke, TIAs,  expressive or receptive aphasia, or amaurosis fugax since I saw him last.   PAST MEDICAL HISTORY:  Is significant for:  1.  Hypertension.  2.  Hypercholesterolemia.   He denies any history of previous myocardial infarction or history of  congestive heart failure. Had no history of emphysema. He has borderline  diabetes.   PAST SURGICAL HISTORY:  Is significant for:  1.  Right femoral-to-above-knee-popliteal bypass with a vein graft.  2.  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  3.  In [id number], he had repair of an infrarenal abdominal  aortic aneurysm      with aorta-left- femoral-and-right-iliac bypass.  4.  He had a revision of his right fem-pop bypass graft for a vein graft      stenosis in December2005.  5.  He had a left hemispheric stroke in June of 2005 and underwent emergency      carotid stenting in [place] at that time. He is on Plavix since      that time and off of aspirin.   FAMILY HISTORY:  His mother died from stroke. Father died from a myocardial  infarction at age 50. He is unaware of any history of premature  cardiovascular disease.   SOCIAL HISTORY:  He is married and has four children. He quit tobacco in  June2005 when he had a stroke.   REVIEW OF SYSTEMS:  Generally, he has had no weight loss, weight gain  problems in his appetite. CARDIAC:  He had no recent chest pain, chest  pressure, palpitations or arrhythmias. PULMONARY: Had no bronchitis, asthma  or wheezing. GI: He had no recent change in his bowel habits  and has no  history of peptic ulcer disease. GU: He has had no dysuria or frequency.  VASCULAR:  He denies any significant claudication, rest pain, or nonhealing  ulcers. He had no history of DVT or phlebitis. NEUROLOGIC: He had no  dizziness, blackouts, headaches, or seizures. ORTHOPEDICS/SKIN: He had no  significant arthritis, muscle pain, or joint pain. HEMATOLOGIC: He had no  bleeding problems or clotting disorders.   ALLERGIES:  No known drug allergies.   MEDICATIONS:  1.  Propylthiouracil 50 mg p.o. twice daily.  2.  Atenolol 25 mg 1 p.o. daily,  3.  Plavix 75 mg p.o. daily. This was discontinued on November 25, 2005, and      was instructed to begin aspirin on that day 325 mg p.o. daily.  4.  Vitorin 40 mg p.o. daily.   PHYSICAL EXAMINATION:  VITAL SIGNS: Blood pressure 102/60; heart rate is 72.  NECK:  He has soft bilateral carotid bruits.  LUNGS:  Clear bilaterally to auscultation.  CARDIAC: He has a regular rate and rhythm.  ABDOMEN: Soft and nontender.  PULSES: He  has palpable femoral pulses and a palpable right popliteal pulse  with a warm and well perfused foot on the right. On the left side, I cannot  palpate pedal pulses; however,  the left foot is warm well-perfused.  NEUROLOGIC:  Exam is not is nonfocal.   His carotid duplex scan shows that the right carotid stenosis has progressed  significantly. Peak systolic velocities now 613 cm/sec with an end-diastolic  velocity of 288 cm/sec. Of note where he had a previous stent, he has mildly  elevated velocities, but these have not changed compared to study 6 months  ago.   Given the rapid progression of the stenosis on the right, which is now  probably 90% or so, I have recommended right carotid endarterectomy in order  to lower his risk of future stroke. We need to leave him off his Plavix for  1 week, and I have instructed him to stop this today and begin taking 325 mg  of aspirin a day. Will plan on proceeding with right carotid endarterectomy  on December 03, 2005. We have discussed the indications for the procedure and  potential complications including but not limited to stroke (peri-procedural  risk 1-2%), MI, nerve injury, or other unpredictable medical problems. All  his questions were answered. He is agreeable to proceed. His surgery has  been scheduled for December 03, 2005.      Di Kindle. Edilia Bo, M.D.  Electronically Signed     CSD/MEDQ  D:  11/25/2005  T:  11/25/2005  Job:  [id number]   cc:   Domingo Dimes, M.D.  [facility]  [address]  [place], [place] [address]  Valinda Hoar [phone]

## 2011-03-27 NOTE — Op Note (Signed)
NAME:  Dylan Knight, Dylan Knight               ACCOUNT NO.:  [id number]   MEDICAL RECORD NO.:  [id number]          PATIENT TYPE:  INP   LOCATION:  2899                         FACILITY:  [facility]   PHYSICIAN:  Di Kindle. 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   PROCEDURE:  Revision of right femoral-popliteal bypass graft with vein patch  angioplasty of the right femoral popliteal graft.   SURGEON:  Di Kindle. Edilia Bo, M.D.   ASSISTANT:  Toribio Harbour, N.P.   ANESTHESIA:  General anesthesia.   INDICATIONS FOR PROCEDURE:  This is a 77 year old gentleman who had  undergone a right femoral to above knee popliteal artery bypass graft in  2002.  On a routine follow-up Duplex scan, he was noted to have  significantly increased velocities in the distal vein graft and an  arteriogram confirmed this with no other problems identified with the graft.  He was brought in for elective revision of his graft.   DESCRIPTION OF PROCEDURE:  The patient was taken to the operating room and  received a general anesthetic.  The right lower extremity was prepped and  draped in the usual sterile fashion.  After the skin incision was made over  the popliteal artery, the dissection was carried down to the vein graft  which was identified and dissected free.  Of note, the saphenous vein was  identified and protected.  The vein was dissected down to the anastomosis to  the above knee popliteal artery.  The area of stenosis was identified with a  Duplex scanner. A short segment of vein was harvested from the ankle through  a longitudinal incision with branches divided between clips and 3-0 silk  ties.  Next, a tourniquet was  placed on the thigh and the patient was  heparinized.  After adequate circulation time, the leg was exsanguinated  with an Esmarch bandage and the tourniquet inflated to 250 mmHg.  Under  tourniquet control, a longitudinal arteriotomy was made over the stenosis in  the vein graft and this was extended onto the hood of the distal  anastomosis.  The vein patch was then sewn in a reverse fashion after it was  opened to be used as a patch.  This was sewn with continuous 6-0 Prolene  suture.  Prior to completing the patch closure, the tourniquet was released.  The artery back-bled and flushed appropriately and the anastomosis  completed.  Flow was reestablished to the leg.  There was a good peroneal  signal at the completion and much better flow to this area at the  completion.  Hemostasis was obtained and the distal incision was closed with  a deep layer of 3-0 Vicryl.  The skin was closed with 4-0 Vicryl.  The thigh  incision was closed over a drain with 2-0 Vicryl and then a 4-0 Vicryl.  Of  note, the JP later had significant venous drainage and therefore I elected  to reexplore the leg.  The leg was reprepped and draped and this incision  was opened.  No specific bleeding site was found.  Some hematoma was washed  out and I elected at this point to reverse the heparin with protamine.  Patient received 50 mg of protamine.  Hemostasis was obtained  in the wound and a 15 Blake drain was laced.  The wound was closed with a  deep layer of 3-0 Vicryl, the skin closed with 4-0 subcuticular stitch.  A  sterile dressing was applied.  The patient tolerated the procedure well and  was transferred to the recovery room in satisfactory condition.  All sponge  and needle counts were correct.      Chri   CSD/MEDQ  D:  10/14/2004  T:  10/14/2004  Job:  [id number]

## 2011-03-27 NOTE — Cardiovascular Report (Signed)
[place]. [facility]  Patient:    Dylan Knight, Dylan Knight Visit Number: [id number] MRN: [id number]          Service Type: CAT Location: [facility] 2899 10 Attending Physician:  Koren Bound Dictated by:   Alvia Grove., M.D. Proc. Date: 03/06/02 Admit Date:  03/06/2002   CC:         Di Kindle. Edilia Bo, M.D.  Alvia Grove., M.D.  Cardiac Catheterization Laboratory   Cardiac Catheterization  INDICATIONS: The patient is a 77 year old gentleman with a history of an abdominal aortic aneurysm. He had a preoperative stress Cardiolite study, which revealed the presence of inferior wall ischemia. He is referred for a heart catheterization for further evaluation.  DESCRIPTION OF PROCEDURE: The right femoral artery was easily cannulated using the modified Seldinger technique.  HEMODYNAMICS: The left ventricular pressure was 175/17. The aortic pressure was 174/91.  ANGIOGRAPHY: We had some difficulty in getting up past the aneurysm. The right 4 catheter was used to steer the guide wire up past the aneurysm. All catheters were exchanged out over a long exchange J wire.  The right coronary artery is relatively smooth and normal. It is small to moderate size and is codominant. The posterior descending artery is relatively small but is otherwise normal.  The left main coronary artery is relatively short but is otherwise normal.  The left anterior descending artery is relatively smooth and normal. There are several diagonal vessels which are relatively small and normal.  The left circumflex artery is a large vessel and is codominant. There are mild irregularities in the proximal segment. The mid circumflex artery has a 30-40% stenosis at the takeoff of the first obtuse marginal artery.  The distal circumflex artery appears to be mildly aneurysmal and then has some mild irregularities between 20-30%. The left circumflex posterior  descending artery is relatively unremarkable.  The first obtuse marginal artery is a moderate sized vessel. There is a proximal 70% stenosis that appears to be about 10-15 mm in length. This stenosis appears to be fairly smooth. In some views it appears to be as tight as 70% but in other views it appears to be only 40-50%. The flow through this lesion does not appear to be slow compared to the other vessels.  The left ventriculogram was performed in a 30 RAO position. It reveals overall normal left ventricular systolic function. There is no mitral regurgitation. The ejection fraction is between 60-65%.  COMPLICATIONS: None.  CONCLUSIONS: Single-vessel coronary artery disease, primarily involving the circumflex marginal vessel. This lesion is moderate in severity and does not appear to be flow-obstructed at this time. It is relatively smooth. I think that the patient can have his abdominal aortic aneurysm surgery before we attempt to correct this lesion. If we were to place a stent, the patient would need to be on Plavix for several months. I think that he is at relatively low risk for his abdominal aortic aneurysm surgery. We will continue with medical therapy. Dictated by:   Alvia Grove., M.D. Attending Physician:  Koren Bound DD:  03/06/02 TD:  03/06/02 Job: [id number]

## 2011-03-27 NOTE — Discharge Summary (Signed)
NAME:  Dylan Knight, Dylan Knight               ACCOUNT NO.:  [id number]   MEDICAL RECORD NO.:  [id number]          PATIENT TYPE:  INP   LOCATION:  3303                         FACILITY:  [facility]   PHYSICIAN:  Di Kindle. Edilia Bo, M.D.DATE OF BIRTH:  02-Dec-1923   DATE OF ADMISSION:  12/03/2005  DATE OF DISCHARGE:  12/04/2005                                 DISCHARGE SUMMARY   ADMISSION DIAGNOSIS:  Greater than 80% asymptomatic right carotid stenosis.   DISCHARGE DIAGNOSES:  1.  Asymptomatic right carotid stenosis, status post right carotid      endarterectomy.  2.  Hypertension.  3.  Hypercholesterolemia.   CONSULTS:  None.   PROCEDURES:  On December 03, 2005, the patient underwent right carotid  endarterectomy with Dacron patch angioplasty by Dr. Di Kindle. Edilia Bo.   HISTORY AND PHYSICAL EXAM:  This is a pleasant 77 year old gentleman who has  been known to the CVTS for muscle vascular issues.  The patient had a  previous right fem-pop bypass graft and also had a previous abdominal aortic  aneurysm repair.  He had a stroke in the past with a left-sided nonemergent  stent placement in [facility] and has done well from that standpoint.  The patient's carotids have been followed by Dr. Edilia Bo.  Specifically, a  moderate right carotid stenosis.  The patient was last seen in June, 2006,  and he had a less than 80% right carotid stenosis that was asymptomatic.  The patient was brought in for a six month follow-up visit, and the right  carotid stenosis had shown to progress significantly and now was very tight  with an end-diastolic velocity of 288 cm/sec.  Given his critical right  carotid stenosis, he was brought in for elective right carotid  endarterectomy so as to lower his risk of stroke.  Of note, the patient  remained asymptomatic.  He denied a history of expressive or receptive  aphasia, amaurosis fugax, syncope, nausea and vomiting.   HOSPITAL COURSE:  Postoperatively, the  patient's stay was uneventful, and he  has progressed as anticipated.  On postop day #1, the patient remained  comfortable with vital signs stable.  Neurologically, he was intact.  The  patient was able to ambulate, eat, and urinate before being discharged on  December 04, 2005.   PHYSICAL EXAMINATION:  VITAL SIGNS:  Temp 97.1, blood pressure 98/45, oxygen  sats 94% on room air.  Pulse 67.  HEART:  Regular rate and rhythm.  LUNGS:  Expiratory is clear to auscultation bilaterally.  NEUROLOGIC:  Alert and oriented x3.  Muscle strength grossly intact  bilaterally.  SKIN:  Right neck incision healing well.  Clear, dry, and intact.   DISCHARGE CONDITION:  Good.   DISPOSITION:  Patient will be discharged to home.   MEDICATIONS:  1.  __________ p.o. daily.  2.  Plavix 75 mg p.o. daily.  3.  Propylthiouracil 50 mg p.o. b.i.d.  4.  Vytorin 10/40 mg p.o. daily.  5.  Aspirin 325 mg p.o. daily.  6.  Tylox 1-2 tablets every 4 hours p.r.n.   INSTRUCTIONS:  Patient is instructed to  follow a low fat, low salt diet.  He  is to do no driving or heavy lifting for three weeks.  The patient is to  increase activity slowly and ambulate 3-4 times daily.  The patient may  shower and clean his wounds with mild soap and water.  The patient was  instructed to call the office if any wound problems should arise, such as  erythema, drainage, opening of the wound site.  Patient also was instructed  to call the office if he gets a temp greater than 101.5.   FOLLOW UP:  Patient has a follow-up appointment with Dr. Edilia Bo on December 23, 2005 at 3:30.      Constance Holster, PA      Di Kindle. Edilia Bo, M.D.  Electronically Signed    JMW/MEDQ  D:  01/21/2006  T:  01/23/2006  Job:  [id number]

## 2011-03-27 NOTE — Op Note (Signed)
[facility]  Patient:    Dylan Knight, Dylan Knight Visit Number: [id number] MRN: [id number]          Service Type: DSU Location: *N Attending Physician:  Bennye Alm Dictated by:   Di Kindle. Edilia Bo, M.D. Proc. Date: 03/16/02                             Operative Report  PREOPERATIVE DIAGNOSIS:  A 5 cm infrarenal abdominal aortic aneurysm.  POSTOPERATIVE DIAGNOSIS:  a 5 cm infrarenal abdominal aortic aneurysm.  PROCEDURE:  Repair of infrarenal abdominal aortic aneurysm with aorto-left femoral-right iliac bypass graft (14 x 7 mm Dacron graft).  SURGEON:  Di Kindle. Edilia Bo, M.D.  ASSISTANTS:  Caralee Ates, M.D., and Loura Pardon, P.A.  ANESTHESIA:  General.  INDICATION:  This is a 77 year old gentleman who had undergone a previous right femoral to above-knee popliteal artery bypass graft for a nonhealing toe wound.  I have been following him with a small aneurysm.  This aneurysm had enlarged significantly to from 4.1 cm less than a year ago to 5 cm.  Elective repair was recommended.  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  The procedure and potential complications were discussed with the patient in detail preoperatively.  DESCRIPTION OF PROCEDURE:  The patient was taken to the operating room after an arterial line and Swan-Ganz catheter were placed by anesthesia.  The abdomen and groins were prepped and draped in the usual sterile fashion.  The abdomen was entered through a midline incision and upon exploration, the only intra-abdominal pathology was his aneurysm.  The transverse colon was reflected superiorly and the small bowel reflected to the right.  The retroperitoneal tissue was divided up to the level of the left renal vein. The accessory renal arteries were identified.  The right accessory renal artery was fairly small and extended right up to where the  aneurysm began. The left renal artery was slightly further superiorly in the neck of the aneurysm.  Of note, I did expose the left femoral artery through a longitudinal incision in the left groin, as the patient had tandem iliac stenoses on the left and I thought that a femoral bypass was needed on the left side.  On the right side as the patient had a functioning femoral-popliteal bypass graft and I wanted to stay away from the groin, I elected to do a right aorto-external iliac artery graft.  The common iliac artery on the left was dissected free, and two Tevdek sutures were passed around this so that we could ligate the artery.  The retroperitoneal tunnel was created and an umbilical tape passed.  On the right side the dissection was carried down to the external iliac artery, and the ureter was preserved. Two Tevdek sutures were passed around the common iliac artery so that it could be ligated.  The patient was then heparinized with 7000 units of IV heparin and received 25 g of mannitol.  The left external iliac artery was clamped and then both common iliac arteries doubly ligated with #1 Tevdek sutures.  The infrarenal aorta clamp was placed and the left accessory renal artery was controlled with a blue vessel loop.  The right accessory renal artery was controlled with a serrefine clamp.  The aneurysm was then opened and Td off just below the level of the accessory renal arteries.  Lumbar sutures were oversewn with 2-0 silk  ties.  There was no backbleeding distally.  A 14 x 7 mm graft was selected.  This was cut to the appropriate length and sewn end-to-end to the infrarenal aorta right at the level of the lowest accessory renal artery on the right.  This was done also with a felt cuff.  The proximal anastomosis was tested, and it was hemostatic and the graft was flushed.  The respective limbs of the graft were clamped.  The left limb of the graft was brought through the retroperitoneal  tunnel for the left femoral anastomosis. On the right side the external iliac artery had been clamped, and this vessel was then opened longitudinally.  The graft was cut to the appropriate length and spatulated.  It was tunneled under the ureter and sewn end-to-side to the external iliac artery using continuous 5-0 Prolene suture.  Prior to completing the anastomosis, the vessels were backbled and flushed appropriately and the anastomosis completed.  Flow was re-established to the right leg, which the patient tolerated from a hemodynamic standpoint.  Next attention was turned to the left femoral anastomosis.  The common femoral, superficial femoral, and deep femoral arteries were controlled.  The artery was very thick.  There appeared to be a soft spot on the lateral wall, and therefore I made this arteriotomy slightly laterally, extending toward the deep femoral artery.  The vessel was patent.  The left limb of the graft was cut to the appropriate length, spatulated, and sewn end-to-side to the common femoral artery on the left with continuous 5-0 Prolene suture.  Prior to completing this anastomosis the vessels were backbled and flushed appropriately and the anastomosis completed.  Flow was re-established to the left leg, which the patient tolerated from a hemodynamic standpoint. Hemostasis was obtained in the left groin incision.  This wound was closed with a deep layer of 2-0 Vicryl, subcutaneous layer with 2-0 Vicryl, and the skin closed with staples.  Next hemostasis was obtained within the abdomen. All the anastomoses were inspected and were hemostatic.  The aneurysm was closed over the graft with running 2-0 Vicryl suture.  The retroperitoneal tissue was closed with running 2-0 Vicryl suture.  The abdominal contents were returned to their normal position and the fascia was closed with two running #1 PDS sutures.  The skin was closed with staples.  A sterile dressing was applied.   The patient tolerated the procedure well, was transferred to the recovery room in satisfactory condition.  All needle and sponge counts were correct. Dictated by:   Di Kindle Edilia Bo, M.D.  Attending Physician:  Bennye Alm DD:  03/16/02 TD:  03/18/02 Job: [id number]

## 2011-04-22 ENCOUNTER — Other Ambulatory Visit: Payer: Self-pay

## 2011-04-24 ENCOUNTER — Other Ambulatory Visit: Payer: Self-pay

## 2011-04-29 ENCOUNTER — Encounter (INDEPENDENT_AMBULATORY_CARE_PROVIDER_SITE_OTHER): Payer: Medicare Other

## 2011-04-29 ENCOUNTER — Other Ambulatory Visit (INDEPENDENT_AMBULATORY_CARE_PROVIDER_SITE_OTHER): Payer: Medicare Other

## 2011-04-29 DIAGNOSIS — Z48812 Encounter for surgical aftercare following surgery on the circulatory system: Secondary | ICD-10-CM

## 2011-04-29 DIAGNOSIS — I739 Peripheral vascular disease, unspecified: Secondary | ICD-10-CM

## 2011-04-29 DIAGNOSIS — I6529 Occlusion and stenosis of unspecified carotid artery: Secondary | ICD-10-CM

## 2011-05-15 NOTE — Procedures (Unsigned)
CAROTID DUPLEX EXAM  INDICATION:  Follow up carotid stenosis.  HISTORY: Diabetes:  Yes. Cardiac:  No. Hypertension:  Yes. Smoking:  Previous. Previous Surgery:  Right carotid endarterectomy on 12/03/2005; left internal carotid artery stent on 03/16/2002 at [facility]. CV History:  Asymptomatic. Amaurosis Fugax No, Paresthesias No, Hemiparesis No.                                      RIGHT             LEFT Brachial systolic pressure:         135               130 Brachial Doppler waveforms:         WNL               WNL Vertebral direction of flow:        Antegrade         Abnormal/blunted DUPLEX VELOCITIES (cm/sec) CCA peak systolic                   94                95 ECA peak systolic                   67                124 ICA peak systolic                   38                97 ICA end diastolic                   9                 23 PLAQUE MORPHOLOGY:                  Heterogenous      Heterogenous PLAQUE AMOUNT:                      Mild              Mild PLAQUE LOCATION:                    CCA               CCA, ICA, ECA  IMPRESSION: 1. Right internal carotid artery appears patent with a history of     carotid endarterectomy. 2. Left internal carotid artery is patent with mildly elevated     velocities throughout the stent, which may be normal and consistent     with stent placement. 3. Abnormal, blunted left vertebral artery which may be suggestive of     a significant stenosis more distal than can be visualized.  ___________________________________________ Di Kindle. Edilia Bo, M.D.  SH/MEDQ  D:  04/29/2011  T:  04/29/2011  Job:  [id number]

## 2011-05-15 NOTE — Procedures (Unsigned)
BYPASS GRAFT EVALUATION  INDICATION:  Follow up peripheral vascular disease.  HISTORY: Diabetes:  Yes. Cardiac:  No. Hypertension:  Yes. Smoking:  Previous. Previous Surgery:  AAA repair, 03/16/2002; right femoral to popliteal bypass graft revision on 10/14/2004.  SINGLE LEVEL ARTERIAL EXAM                              RIGHT              LEFT Brachial:                    135                130 Anterior tibial:             117                73 Posterior tibial:            83                 97 Peroneal: Ankle/brachial index:        0.87               0.72  PREVIOUS ABI:  Date: 04/11/2010  RIGHT:  1.01  LEFT:  0.66  LOWER EXTREMITY BYPASS GRAFT DUPLEX EXAM:  DUPLEX:  Mild diffuse heterogenous plaque noted throughout the right distal external iliac, common femoral, and profunda femoral arteries.  IMPRESSION: 1. Patent right femoral to popliteal bypass graft. 2. Decrease in ankle brachial index on the right since previous study     on 04/11/2010. 3. Stable ankle brachial index on the left since previous study on     04/11/2010.  ___________________________________________ Di Kindle. Edilia Bo, M.D.  SH/MEDQ  D:  04/29/2011  T:  04/29/2011  Job:  [id number]

## 2011-06-01 ENCOUNTER — Encounter (INDEPENDENT_AMBULATORY_CARE_PROVIDER_SITE_OTHER): Payer: Medicare Other | Admitting: Ophthalmology

## 2011-06-01 DIAGNOSIS — H43819 Vitreous degeneration, unspecified eye: Secondary | ICD-10-CM

## 2011-06-01 DIAGNOSIS — H3530 Unspecified macular degeneration: Secondary | ICD-10-CM

## 2011-06-01 DIAGNOSIS — H353290 Exudative age-related macular degeneration, unspecified eye, stage unspecified: Secondary | ICD-10-CM

## 2011-09-29 ENCOUNTER — Other Ambulatory Visit: Payer: Self-pay | Admitting: Orthopedic Surgery

## 2011-09-29 DIAGNOSIS — M545 Low back pain, unspecified: Secondary | ICD-10-CM

## 2011-10-06 ENCOUNTER — Other Ambulatory Visit: Payer: Self-pay | Admitting: Orthopedic Surgery

## 2011-10-06 DIAGNOSIS — M545 Low back pain, unspecified: Secondary | ICD-10-CM

## 2011-10-12 ENCOUNTER — Ambulatory Visit
Admission: RE | Admit: 2011-10-12 | Discharge: 2011-10-12 | Disposition: A | Discharge status: Home / Self Care | Payer: Medicare Other | Source: Ambulatory Visit | Attending: Orthopedic Surgery | Admitting: Orthopedic Surgery

## 2011-10-12 DIAGNOSIS — M545 Low back pain, unspecified: Secondary | ICD-10-CM

## 2011-10-12 MED ORDER — IOHEXOL 180 MG/ML  SOLN
1.0000 mL | Freq: Once | INTRAMUSCULAR | Status: AC | PRN
  Administered 2011-10-12: 1 mL via EPIDURAL

## 2011-10-12 MED ORDER — DIPHENHYDRAMINE HCL 50 MG PO CAPS
50.0000 mg | ORAL_CAPSULE | Freq: Once | ORAL | Status: AC
  Administered 2011-10-12: 50 mg via ORAL

## 2011-10-12 MED ORDER — METHYLPREDNISOLONE ACETATE 40 MG/ML INJ SUSP (RADIOLOG
120.0000 mg | Freq: Once | INTRAMUSCULAR | Status: AC
  Administered 2011-10-12: 120 mg via EPIDURAL

## 2011-10-12 NOTE — Patient Instructions (Signed)
Post Procedure Spinal Discharge Instruction Sheet  1. You may resume a regular diet and any medications that you routinely take (including pain medications).  2. No driving day of procedure.  3. Light activity throughout the rest of the day.  Do not do any strenuous work, exercise, bending or lifting.  The day following the procedure, you can resume normal physical activity but you should refrain from exercising or physical therapy for at least three days thereafter.   Common Side Effects:   Headaches- take your usual medications as directed by your physician.  Increase your fluid intake.  Caffeinated beverages may be helpful.  Lie flat in bed until your headache resolves.   Restlessness or inability to sleep- you may have trouble sleeping for the next few days.  Ask your referring physician if you need any medication for sleep.   Facial flushing or redness- should subside within a few days.   Increased pain- a temporary increase in pain a day or two following your procedure is not unusual.  Take your pain medication as prescribed by your referring physician.   Leg cramps  Please contact our office at [phone] for the following symptoms:  Fever greater than 100 degrees.  Headaches unresolved with medication after 2-3 days.  Increased swelling, pain, or redness at injection site.  Thank you for visiting our office.   Resume plavix today.

## 2011-10-26 ENCOUNTER — Encounter (INDEPENDENT_AMBULATORY_CARE_PROVIDER_SITE_OTHER): Payer: Medicare Other | Admitting: Ophthalmology

## 2011-10-26 DIAGNOSIS — H43819 Vitreous degeneration, unspecified eye: Secondary | ICD-10-CM

## 2011-10-26 DIAGNOSIS — H2510 Age-related nuclear cataract, unspecified eye: Secondary | ICD-10-CM

## 2011-10-26 DIAGNOSIS — E1139 Type 2 diabetes mellitus with other diabetic ophthalmic complication: Secondary | ICD-10-CM

## 2011-10-26 DIAGNOSIS — H353290 Exudative age-related macular degeneration, unspecified eye, stage unspecified: Secondary | ICD-10-CM

## 2011-10-26 DIAGNOSIS — H3530 Unspecified macular degeneration: Secondary | ICD-10-CM

## 2011-10-26 DIAGNOSIS — E1165 Type 2 diabetes mellitus with hyperglycemia: Secondary | ICD-10-CM

## 2011-10-26 DIAGNOSIS — H33009 Unspecified retinal detachment with retinal break, unspecified eye: Secondary | ICD-10-CM

## 2011-10-28 ENCOUNTER — Encounter (INDEPENDENT_AMBULATORY_CARE_PROVIDER_SITE_OTHER): Payer: Medicare Other | Admitting: Vascular Surgery

## 2011-10-28 DIAGNOSIS — Z48812 Encounter for surgical aftercare following surgery on the circulatory system: Secondary | ICD-10-CM

## 2011-10-28 DIAGNOSIS — I739 Peripheral vascular disease, unspecified: Secondary | ICD-10-CM

## 2011-11-04 ENCOUNTER — Other Ambulatory Visit: Payer: Self-pay | Admitting: Orthopedic Surgery

## 2011-11-04 DIAGNOSIS — IMO0002 Reserved for concepts with insufficient information to code with codable children: Secondary | ICD-10-CM

## 2011-11-13 ENCOUNTER — Ambulatory Visit
Admission: RE | Admit: 2011-11-13 | Discharge: 2011-11-13 | Disposition: A | Discharge status: Home / Self Care | Payer: Medicare Other | Source: Ambulatory Visit | Attending: Orthopedic Surgery | Admitting: Orthopedic Surgery

## 2011-11-13 DIAGNOSIS — M5126 Other intervertebral disc displacement, lumbar region: Secondary | ICD-10-CM

## 2011-11-13 DIAGNOSIS — IMO0002 Reserved for concepts with insufficient information to code with codable children: Secondary | ICD-10-CM

## 2011-11-13 MED ORDER — IOHEXOL 180 MG/ML  SOLN
1.0000 mL | Freq: Once | INTRAMUSCULAR | Status: AC | PRN
  Administered 2011-11-13: 1 mL via EPIDURAL

## 2011-11-13 MED ORDER — METHYLPREDNISOLONE ACETATE 40 MG/ML INJ SUSP (RADIOLOG
120.0000 mg | Freq: Once | INTRAMUSCULAR | Status: AC
  Administered 2011-11-13: 120 mg via EPIDURAL

## 2011-11-16 ENCOUNTER — Other Ambulatory Visit: Payer: Self-pay | Admitting: *Deleted

## 2011-11-16 DIAGNOSIS — I739 Peripheral vascular disease, unspecified: Secondary | ICD-10-CM

## 2011-11-16 DIAGNOSIS — Z48812 Encounter for surgical aftercare following surgery on the circulatory system: Secondary | ICD-10-CM

## 2011-11-24 ENCOUNTER — Encounter: Payer: Self-pay | Admitting: Vascular Surgery

## 2011-11-24 NOTE — Procedures (Unsigned)
BYPASS GRAFT EVALUATION  INDICATION:  Followup peripheral vascular disease.  HISTORY: Diabetes:  Yes Cardiac:  No Hypertension:  Yes Smoking:  Previous Previous Surgery:  AAA repair on 03/16/2002; right femoral to popliteal artery bypass graft revision 10/14/2004.  SINGLE LEVEL ARTERIAL EXAM                              RIGHT              LEFT Brachial: Anterior tibial: Posterior tibial: Peroneal: Ankle/brachial index:        1.03/0.69          0.67 Toe/brachial index:          0.52               0.32  PREVIOUS ABI:  Date:  04/29/2011  RIGHT:  0.87  LEFT:  0.72   LOWER EXTREMITY BYPASS GRAFT DUPLEX EXAM:  DUPLEX:  Mild heterogeneous plaque with peak systolic velocity of 206 cm/s present involving the right distal external iliac artery suggesting stenosis of 50%-75%.   IMPRESSION: 1. Patent right femoral to popliteal artery bypass graft. 2. Native artery stenosis as noted above. 3. Right ankle brachial index is 1.03 using the dorsalis pedis artery     and is considered in the normal range.  The right ankle brachial     index using the posterior tibial artery is 0.69 and considered in     the moderate claudication range. 4. The left ankle brachial index is 0.67 and considered in the     moderate claudication range. 5. Right toe brachial index is 0.52 and considered in the moderate     claudication range. 6. Left toe brachial index is 0.32 and considered in the severe     claudication range. 7. Slight decrease in ankle brachial indices and toe brachial indices     present since previous exam on 04/29/2011.       ___________________________________________ Di Kindle. Edilia Bo, M.D.  SH/MEDQ  D:  10/29/2011  T:  10/29/2011  Job:  [id number]

## 2011-12-23 ENCOUNTER — Other Ambulatory Visit: Payer: Medicare Other

## 2011-12-23 ENCOUNTER — Ambulatory Visit: Payer: Medicare Other | Admitting: Vascular Surgery

## 2011-12-25 ENCOUNTER — Encounter: Payer: Self-pay | Admitting: Vascular Surgery

## 2011-12-29 ENCOUNTER — Encounter: Payer: Self-pay | Admitting: Vascular Surgery

## 2011-12-30 ENCOUNTER — Encounter: Payer: Self-pay | Admitting: Vascular Surgery

## 2011-12-30 ENCOUNTER — Ambulatory Visit (INDEPENDENT_AMBULATORY_CARE_PROVIDER_SITE_OTHER): Payer: Medicare Other | Admitting: Vascular Surgery

## 2011-12-30 ENCOUNTER — Encounter (INDEPENDENT_AMBULATORY_CARE_PROVIDER_SITE_OTHER): Payer: Medicare Other | Admitting: *Deleted

## 2011-12-30 VITALS — BP 131/72 | HR 88 | Resp 16 | Ht 66.0 in | Wt 174.0 lb

## 2011-12-30 DIAGNOSIS — I739 Peripheral vascular disease, unspecified: Secondary | ICD-10-CM | POA: Insufficient documentation

## 2011-12-30 DIAGNOSIS — Z48812 Encounter for surgical aftercare following surgery on the circulatory system: Secondary | ICD-10-CM

## 2011-12-30 NOTE — Progress Notes (Signed)
[facility]  Patient name: Dylan Knight MRN: [id number] DOB: 17-Sep-1924 Sex: male  REASON FOR VISIT: follow up of peripheral vascular disease  HPI: Dylan Knight is a 78 y.o. male who is had a previous abdominal aortic aneurysm repair in 2003. In 2005 he had a right femoropopliteal bypass graft. I've also performed a previous right carotid endarterectomy in he said also a left carotid stent. He was here for a duplex scan in December of 2012 which showed some elevated velocities in the distal right external iliac artery. I believe the peak systolic velocity was approximately 205 cm/s. His femoropopliteal bypass graft was patent. Some contact the patient to come in today for a follow up study. He has had some cramps in his legs but did not give any specific history of claudication, rest pain, or nonhealing ulcers.  In addition the patient had a right carotid endarterectomy in 2007 and a left internal carotid arteries stent in 2003. His most recent duplex was in June of 2012 when he had no evidence of significant carotid stenosis on either side. Since I saw him last he said no history of stroke, TIAs, expressive or receptive aphasia, or amaurosis fugax.  Past Medical History  Diagnosis Date  . Hypertension   . Peripheral vascular disease   . Hyperlipidemia   . BPH (benign prostatic hyperplasia)   . COPD (chronic obstructive pulmonary disease)   . Peripheral arterial disease 2002  . Stroke 2005  . Diabetes mellitus 2011    Border line    Family History  Problem Relation Age of Onset  . Stroke Mother   . Hypertension Mother   . Heart disease Father     SOCIAL HISTORY: History  Substance Use Topics  . Smoking status: Former Smoker    Quit date: 04/09/2004  . Smokeless tobacco: Not on file  . Alcohol Use: No    Allergies  Allergen Reactions  . Iohexol Hives    Went away with benadryl     Current Outpatient Prescriptions  Medication Sig  Dispense Refill  . ATENOLOL-CHLORTHALIDONE PO Take by mouth every morning.      . cholecalciferol (VITAMIN D) 1000 UNITS tablet Take 1,000 Units by mouth daily.      . clopidogrel (PLAVIX) 75 MG tablet Take 75 mg by mouth daily.      Marland Kitchen ezetimibe-simvastatin (VYTORIN) 10-40 MG per tablet Take 1 tablet by mouth at bedtime.      Marland Kitchen guaiFENesin (MUCINEX) 600 MG 12 hr tablet Take 1,200 mg by mouth 2 (two) times daily.      Marland Kitchen loratadine (CLARITIN) 10 MG tablet Take 10 mg by mouth daily.      . Multiple Vitamins-Minerals (PRESERVISION AREDS PO) Take by mouth 2 (two) times daily.      . multivitamin-iron-minerals-folic acid (CENTRUM) chewable tablet Chew 1 tablet by mouth daily.      Marland Kitchen propylthiouracil (PTU) 50 MG tablet Take 100 mg by mouth every morning.      . pantoprazole (PROTONIX) 40 MG tablet Take 40 mg by mouth daily.      . Tamsulosin HCl (FLOMAX) 0.4 MG CAPS Take by mouth daily after supper.      . tolterodine (DETROL LA) 4 MG 24 hr capsule Take 4 mg by mouth daily.        REVIEW OF SYSTEMS: Arly.Keller ] denotes positive finding; [  ] denotes negative finding  CARDIOVASCULAR:  [ ]  chest pain   [ ]  chest  pressure   [ ]  palpitations   [ ]  orthopnea   [ ]  dyspnea on exertion   [ ]  claudication   [ ]  rest pain   [ ]  DVT   [ ]  phlebitis PULMONARY:   [ ]  productive cough   [ ]  asthma   [ ]  wheezing NEUROLOGIC:   [ ]  weakness  [ ]  paresthesias  [ ]  aphasia  [ ]  amaurosis  [ ]  dizziness CONSTITUTIONAL:  [ ]  fever   [ ]  chills  PHYSICAL EXAM: Filed Vitals:   12/30/11 1514  BP: 131/72  Pulse: 88  Resp: 16  Height: 5\' 6"  (1.676 m)  Weight: 174 lb (78.926 kg)  SpO2: 98%   Body mass index is 28.08 kg/(m^2). GENERAL: The patient is a well-nourished male, in no acute distress. The vital signs are documented above. CARDIOVASCULAR: There is a regular rate and rhythm without significant murmur appreciated. I do not detect carotid bruits. He has palpable femoral pulses and warm well-perfused  feet. PULMONARY: There is good air exchange bilaterally without wheezing or rales. ABDOMEN: Soft and non-tender with normal pitched bowel sounds.  MUSCULOSKELETAL: There are no major deformities or cyanosis. NEUROLOGIC: No focal weakness or paresthesias are detected. SKIN: There are no ulcers or rashes noted. PSYCHIATRIC: The patient has a normal affect.  DATA:  I have independently interpreted his Doppler study today which shows an ABI of 100% on the right and 65% on the left. These looked to be stable.  MEDICAL ISSUES: With respect to his peripheral vascular disease have scheduled a follow up duplex of his right external iliac artery stenosis and right lower extremity bypass graft for 6 months. He knows to call sooner if he has problems. He'll also be due for a carotid duplex scan at that time. We can then follow his carotids at yearly intervals.  Nasir Bright S [facility] [place] Beeper: [phone]

## 2012-01-06 NOTE — Procedures (Unsigned)
BYPASS GRAFT EVALUATION  INDICATION:  Follow up right femoral-to-popliteal bypass graft.  HISTORY: Diabetes:  Yes. Cardiac:  No. Hypertension:  Yes. Smoking:  Previously. Previous Surgery:  AAA repair 03/16/2002; right femoral-to-popliteal bypass graft with revision 10/14/2004.  SINGLE LEVEL ARTERIAL EXAM                              RIGHT              LEFT Brachial: Anterior tibial: Posterior tibial: Peroneal: Ankle/brachial index:  PREVIOUS ABI:  Date: 10/28/2011  RIGHT:  1.03  LEFT:  0.67 Date: 12/30/2011  RIGHT:  1.17  LEFT:  0.65  LOWER EXTREMITY BYPASS GRAFT DUPLEX EXAM:  DUPLEX:  Mild heterogeneous plaque with a peak systolic velocity of 171 cm in the external iliac artery with triphasic waveform.  Patent right femoral-to-popliteal bypass graft with triphasic waveforms throughout.  IMPRESSION: 1. Patent right femoral-to-popliteal bypass graft. 2. Stable ankle brachial indices since prior study of 10/28/2011.  ___________________________________________ Di Kindle. Edilia Bo, M.D.  SS/MEDQ  D:  12/30/2011  T:  12/30/2011  Job:  [id number]

## 2012-02-20 ENCOUNTER — Emergency Department: Payer: Self-pay | Admitting: Emergency Medicine

## 2012-03-21 ENCOUNTER — Encounter (INDEPENDENT_AMBULATORY_CARE_PROVIDER_SITE_OTHER): Payer: Medicare Other | Admitting: Ophthalmology

## 2012-03-21 DIAGNOSIS — H353290 Exudative age-related macular degeneration, unspecified eye, stage unspecified: Secondary | ICD-10-CM

## 2012-03-21 DIAGNOSIS — H3530 Unspecified macular degeneration: Secondary | ICD-10-CM

## 2012-04-29 ENCOUNTER — Other Ambulatory Visit: Payer: Medicare Other

## 2012-06-28 ENCOUNTER — Encounter: Payer: Self-pay | Admitting: Vascular Surgery

## 2012-06-29 ENCOUNTER — Encounter: Payer: Self-pay | Admitting: Vascular Surgery

## 2012-06-29 ENCOUNTER — Ambulatory Visit (INDEPENDENT_AMBULATORY_CARE_PROVIDER_SITE_OTHER): Payer: Medicare Other | Admitting: Vascular Surgery

## 2012-06-29 ENCOUNTER — Other Ambulatory Visit (INDEPENDENT_AMBULATORY_CARE_PROVIDER_SITE_OTHER): Payer: Medicare Other | Admitting: *Deleted

## 2012-06-29 ENCOUNTER — Encounter (INDEPENDENT_AMBULATORY_CARE_PROVIDER_SITE_OTHER): Payer: Medicare Other | Admitting: *Deleted

## 2012-06-29 VITALS — BP 138/69 | HR 76 | Resp 12 | Ht 66.0 in | Wt 167.0 lb

## 2012-06-29 DIAGNOSIS — I739 Peripheral vascular disease, unspecified: Secondary | ICD-10-CM

## 2012-06-29 DIAGNOSIS — I6529 Occlusion and stenosis of unspecified carotid artery: Secondary | ICD-10-CM

## 2012-06-29 NOTE — Assessment & Plan Note (Signed)
This patient has had a previous right carotid endarterectomy in 2007 and has no evidence of recurrent right carotid stenosis. The patient has also had a left internal carotid artery stent. Likewise this is widely patent without evidence of restenosis. He is asymptomatic. I for a follow up carotid duplex scan in 1 year now see him back at that time. He knows to call sooner if he has problems.

## 2012-06-29 NOTE — Assessment & Plan Note (Signed)
This patient has undergone previous repair of an abdominal aortic aneurysm in 2003. Also has a right femoropopliteal bypass graft which has been revised in 2005. His duplex today shows that his bypass graft is widely patent in his ABI is normal on the right. ABI on the left is 61%. I've encouraged him to stay as active as possible. Fortunately he is not a smoker. I've ordered follow up ABIs in the graft duplex in 1 year. I'll see him back at that time. He knows to call sooner if he has problems.

## 2012-06-29 NOTE — Progress Notes (Signed)
Vascular and Vein Specialist of [facility]  Patient name: Dylan Knight MRN: [id number] DOB: 1924-07-12 Sex: male  REASON FOR VISIT: follow up of carotid disease and peripheral vascular disease.  HPI: Dylan Knight is a 78 y.o. male he has undergone previous repair of an abdominal aortic aneurysm in 2003. He actually had a right femoral to above-knee pop bypass in 2002. In 2070 and a right carotid endarterectomy with Dacron patch angioplasty prior to that in 2005 he had a revision of his right femoropopliteal bypass graft.  He denies any history of stroke, TIAs, expressive or receptive aphasia, or amaurosis fugax. He's had some mild left calf claudication. He's had no rest pain or nonhealing ulcers. He remains fairly active.  Past Medical History  Diagnosis Date  . Hypertension   . Peripheral vascular disease   . Hyperlipidemia   . BPH (benign prostatic hyperplasia)   . COPD (chronic obstructive pulmonary disease)   . Peripheral arterial disease 2002  . Stroke 2005  . Diabetes mellitus 2011    Border line    Family History  Problem Relation Age of Onset  . Stroke Mother   . Hypertension Mother   . Heart disease Father     SOCIAL HISTORY: History  Substance Use Topics  . Smoking status: Former Smoker    Quit date: 04/09/2004  . Smokeless tobacco: Never Used  . Alcohol Use: No    Allergies  Allergen Reactions  . Iohexol Hives    Went away with benadryl     Current Outpatient Prescriptions  Medication Sig Dispense Refill  . ATENOLOL-CHLORTHALIDONE PO Take by mouth every morning.      . clopidogrel (PLAVIX) 75 MG tablet Take 75 mg by mouth daily.      Marland Kitchen ezetimibe-simvastatin (VYTORIN) 10-40 MG per tablet Take 1 tablet by mouth at bedtime.      Marland Kitchen guaiFENesin (MUCINEX) 600 MG 12 hr tablet Take 1,200 mg by mouth 2 (two) times daily.      Marland Kitchen KLOR-CON M10 10 MEQ tablet Take by mouth 2 (two) times daily.      Marland Kitchen loratadine (CLARITIN) 10 MG tablet Take 10 mg by mouth daily.       . Multiple Vitamins-Minerals (PRESERVISION AREDS PO) Take by mouth 2 (two) times daily.      . multivitamin-iron-minerals-folic acid (CENTRUM) chewable tablet Chew 1 tablet by mouth daily.      Marland Kitchen propylthiouracil (PTU) 50 MG tablet Take 100 mg by mouth every morning.      . cholecalciferol (VITAMIN D) 1000 UNITS tablet Take 1,000 Units by mouth daily.      . pantoprazole (PROTONIX) 40 MG tablet Take 40 mg by mouth daily.      . Tamsulosin HCl (FLOMAX) 0.4 MG CAPS Take by mouth daily after supper.      . tolterodine (DETROL LA) 4 MG 24 hr capsule Take 4 mg by mouth daily.        REVIEW OF SYSTEMS: Arly.Keller ] denotes positive finding; [  ] denotes negative finding  CARDIOVASCULAR:  [ ]  chest pain   [ ]  chest pressure   [ ]  palpitations   [ ]  orthopnea   [ ]  dyspnea on exertion   Arly.Keller ] claudication- left calf   [ ]  rest pain   [ ]  DVT   [ ]  phlebitis PULMONARY:   [ ]  productive cough   [ ]  asthma   [ ]  wheezing NEUROLOGIC:   [ ]  weakness  [ ]   paresthesias  [ ]  aphasia  [ ]  amaurosis  [ ]  dizziness HEMATOLOGIC:   [ ]  bleeding problems   [ ]  clotting disorders MUSCULOSKELETAL:  [ ]  joint pain   [ ]  joint swelling [ ]  leg swelling GASTROINTESTINAL: [ ]   blood in stool  [ ]   hematemesis GENITOURINARY:  [ ]   dysuria  [ ]   hematuria PSYCHIATRIC:  [ ]  history of major depression INTEGUMENTARY:  [ ]  rashes  [ ]  ulcers CONSTITUTIONAL:  [ ]  fever   [ ]  chills  PHYSICAL EXAM: Filed Vitals:   06/29/12 1458 06/29/12 1459  BP: 124/61 138/69  Pulse: 75 76  Resp: 16 12  Height: 5\' 6"  (1.676 m)   Weight: 167 lb (75.751 kg)   SpO2: 98%    Body mass index is 26.95 kg/(m^2). GENERAL: The patient is a well-nourished male, in no acute distress. The vital signs are documented above. CARDIOVASCULAR: There is a regular rate and rhythm without significant murmur appreciated. I do not detect carotid bruits. He has palpable femoral pulses. I cannot palpate pedal pulses however both feet are warm and well-perfused.  He has no significant lower extremity swelling. PULMONARY: There is good air exchange bilaterally without wheezing or rales. ABDOMEN: Soft and non-tender with normal pitched bowel sounds.  MUSCULOSKELETAL: There are no major deformities or cyanosis. NEUROLOGIC: No focal weakness or paresthesias are detected. SKIN: There are no ulcers or rashes noted. PSYCHIATRIC: The patient has a normal affect.  DATA:  I have independently interpreted his lower extremity arterial duplex scan. This shows his bypass graft is patent with biphasic Doppler signals throughout the graft. He has an ABI of 100% on the right. On the left side his ABI is 0.61. His ABI previously in February was 0.67. Esters not been a significant change.  I have also independently interpreted his carotid duplex scan. This shows no evidence of recurrent carotid stenosis on the right. On the left his carotid stent is widely patent.  MEDICAL ISSUES:  Occlusion and stenosis of carotid artery without mention of cerebral infarction This patient has had a previous right carotid endarterectomy in 2007 and has no evidence of recurrent right carotid stenosis. The patient has also had a left internal carotid artery stent. Likewise this is widely patent without evidence of restenosis. He is asymptomatic. I for a follow up carotid duplex scan in 1 year now see him back at that time. He knows to call sooner if he has problems.  Peripheral vascular disease, unspecified This patient has undergone previous repair of an abdominal aortic aneurysm in 2003. Also has a right femoropopliteal bypass graft which has been revised in 2005. His duplex today shows that his bypass graft is widely patent in his ABI is normal on the right. ABI on the left is 61%. I've encouraged him to stay as active as possible. Fortunately he is not a smoker. I've ordered follow up ABIs in the graft duplex in 1 year. I'll see him back at that time. He knows to call sooner if he has  problems.   Swanson Farnell S [facility] [place]: [phone]

## 2012-08-15 ENCOUNTER — Encounter (INDEPENDENT_AMBULATORY_CARE_PROVIDER_SITE_OTHER): Payer: Medicare Other | Admitting: Ophthalmology

## 2012-08-15 DIAGNOSIS — I10 Essential (primary) hypertension: Secondary | ICD-10-CM

## 2012-08-15 DIAGNOSIS — H2510 Age-related nuclear cataract, unspecified eye: Secondary | ICD-10-CM

## 2012-08-15 DIAGNOSIS — H43819 Vitreous degeneration, unspecified eye: Secondary | ICD-10-CM

## 2012-08-15 DIAGNOSIS — H3530 Unspecified macular degeneration: Secondary | ICD-10-CM

## 2012-08-15 DIAGNOSIS — H353290 Exudative age-related macular degeneration, unspecified eye, stage unspecified: Secondary | ICD-10-CM

## 2012-08-15 DIAGNOSIS — H35039 Hypertensive retinopathy, unspecified eye: Secondary | ICD-10-CM

## 2012-08-24 ENCOUNTER — Other Ambulatory Visit: Payer: Self-pay | Admitting: *Deleted

## 2012-08-24 DIAGNOSIS — Z48812 Encounter for surgical aftercare following surgery on the circulatory system: Secondary | ICD-10-CM

## 2012-08-24 DIAGNOSIS — I739 Peripheral vascular disease, unspecified: Secondary | ICD-10-CM

## 2012-08-24 DIAGNOSIS — I6529 Occlusion and stenosis of unspecified carotid artery: Secondary | ICD-10-CM

## 2012-10-12 ENCOUNTER — Inpatient Hospital Stay: Payer: Self-pay | Admitting: Student

## 2012-10-12 LAB — CBC WITH DIFFERENTIAL/PLATELET
Basophil #: 0.1 10*3/uL (ref 0.0–0.1)
Basophil %: 0.4 %
Eosinophil #: 0.2 10*3/uL (ref 0.0–0.7)
Eosinophil %: 1 %
HCT: 48.8 % (ref 40.0–52.0)
HGB: 16.8 g/dL (ref 13.0–18.0)
Lymphocyte #: 0.4 10*3/uL — ABNORMAL LOW (ref 1.0–3.6)
Lymphocyte %: 2.6 %
MCH: 34.1 pg — ABNORMAL HIGH (ref 26.0–34.0)
MCHC: 34.5 g/dL (ref 32.0–36.0)
MCV: 99 fL (ref 80–100)
Monocyte #: 0.9 x10 3/mm (ref 0.2–1.0)
Monocyte %: 5.6 %
Neutrophil #: 14.7 10*3/uL — ABNORMAL HIGH (ref 1.4–6.5)
Neutrophil %: 90.4 %
Platelet: 204 10*3/uL (ref 150–440)
RBC: 4.94 10*6/uL (ref 4.40–5.90)
RDW: 13.4 % (ref 11.5–14.5)
WBC: 16.3 10*3/uL — ABNORMAL HIGH (ref 3.8–10.6)

## 2012-10-12 LAB — COMPREHENSIVE METABOLIC PANEL
Albumin: 4.2 g/dL (ref 3.4–5.0)
Alkaline Phosphatase: 58 U/L (ref 50–136)
Anion Gap: 8 (ref 7–16)
BUN: 18 mg/dL (ref 7–18)
Bilirubin,Total: 0.9 mg/dL (ref 0.2–1.0)
Calcium, Total: 9.9 mg/dL (ref 8.5–10.1)
Chloride: 96 mmol/L — ABNORMAL LOW (ref 98–107)
Co2: 28 mmol/L (ref 21–32)
Creatinine: 1.19 mg/dL (ref 0.60–1.30)
EGFR (African American): >60
EGFR (Non-African Amer.): 55 — ABNORMAL LOW
Glucose: 153 mg/dL — ABNORMAL HIGH (ref 65–99)
Osmolality: 269 (ref 275–301)
Potassium: 3.7 mmol/L (ref 3.5–5.1)
SGOT(AST): 25 U/L (ref 15–37)
SGPT (ALT): 27 U/L (ref 12–78)
Sodium: 132 mmol/L — ABNORMAL LOW (ref 136–145)
Total Protein: 7.6 g/dL (ref 6.4–8.2)

## 2012-10-12 LAB — TROPONIN I: Troponin-I: <0.02 ng/mL

## 2012-10-12 LAB — HEMOGLOBIN A1C: Hemoglobin A1C: 6.6 % — ABNORMAL HIGH (ref 4.2–6.3)

## 2012-10-12 LAB — T4, FREE: Free Thyroxine: 0.78 ng/dL (ref 0.76–1.46)

## 2012-10-12 LAB — TSH: Thyroid Stimulating Horm: 3.65 u[IU]/mL

## 2012-10-13 LAB — BASIC METABOLIC PANEL
Anion Gap: 10 (ref 7–16)
BUN: 15 mg/dL (ref 7–18)
Calcium, Total: 9.1 mg/dL (ref 8.5–10.1)
Chloride: 101 mmol/L (ref 98–107)
Co2: 22 mmol/L (ref 21–32)
Creatinine: 1.38 mg/dL — ABNORMAL HIGH (ref 0.60–1.30)
EGFR (African American): 53 — ABNORMAL LOW
EGFR (Non-African Amer.): 46 — ABNORMAL LOW
Glucose: 166 mg/dL — ABNORMAL HIGH (ref 65–99)
Osmolality: 271 (ref 275–301)
Potassium: 3.7 mmol/L (ref 3.5–5.1)
Sodium: 133 mmol/L — ABNORMAL LOW (ref 136–145)

## 2012-10-13 LAB — CBC WITH DIFFERENTIAL/PLATELET
Basophil #: 0 10*3/uL (ref 0.0–0.1)
Basophil %: 0.2 %
Eosinophil #: 0 10*3/uL (ref 0.0–0.7)
Eosinophil %: 0 %
HCT: 46.4 % (ref 40.0–52.0)
HGB: 15.8 g/dL (ref 13.0–18.0)
Lymphocyte #: 0.5 10*3/uL — ABNORMAL LOW (ref 1.0–3.6)
Lymphocyte %: 2.3 %
MCH: 34.1 pg — ABNORMAL HIGH (ref 26.0–34.0)
MCHC: 34.1 g/dL (ref 32.0–36.0)
MCV: 100 fL (ref 80–100)
Monocyte #: 1.2 x10 3/mm — ABNORMAL HIGH (ref 0.2–1.0)
Monocyte %: 5.2 %
Neutrophil #: 21 10*3/uL — ABNORMAL HIGH (ref 1.4–6.5)
Neutrophil %: 92.3 %
Platelet: 185 10*3/uL (ref 150–440)
RBC: 4.64 10*6/uL (ref 4.40–5.90)
RDW: 13.5 % (ref 11.5–14.5)
WBC: 22.7 10*3/uL — ABNORMAL HIGH (ref 3.8–10.6)

## 2012-10-14 LAB — CBC WITH DIFFERENTIAL/PLATELET
Basophil #: 0 10*3/uL (ref 0.0–0.1)
Basophil %: 0.2 %
Eosinophil #: 0 10*3/uL (ref 0.0–0.7)
Eosinophil %: 0.1 %
HCT: 43.8 % (ref 40.0–52.0)
HGB: 15 g/dL (ref 13.0–18.0)
Lymphocyte #: 0.5 10*3/uL — ABNORMAL LOW (ref 1.0–3.6)
Lymphocyte %: 2.4 %
MCH: 34.3 pg — ABNORMAL HIGH (ref 26.0–34.0)
MCHC: 34.3 g/dL (ref 32.0–36.0)
MCV: 100 fL (ref 80–100)
Monocyte #: 0.9 x10 3/mm (ref 0.2–1.0)
Monocyte %: 4.5 %
Neutrophil #: 18.1 10*3/uL — ABNORMAL HIGH (ref 1.4–6.5)
Neutrophil %: 92.8 %
Platelet: 212 10*3/uL (ref 150–440)
RBC: 4.38 10*6/uL — ABNORMAL LOW (ref 4.40–5.90)
RDW: 13.8 % (ref 11.5–14.5)
WBC: 19.5 10*3/uL — ABNORMAL HIGH (ref 3.8–10.6)

## 2012-10-14 LAB — BASIC METABOLIC PANEL
Anion Gap: 9 (ref 7–16)
BUN: 19 mg/dL — ABNORMAL HIGH (ref 7–18)
Calcium, Total: 8.9 mg/dL (ref 8.5–10.1)
Chloride: 99 mmol/L (ref 98–107)
Co2: 26 mmol/L (ref 21–32)
Creatinine: 1.25 mg/dL (ref 0.60–1.30)
EGFR (African American): >60
EGFR (Non-African Amer.): 52 — ABNORMAL LOW
Glucose: 227 mg/dL — ABNORMAL HIGH (ref 65–99)
Osmolality: 278 (ref 275–301)
Potassium: 3.8 mmol/L (ref 3.5–5.1)
Sodium: 134 mmol/L — ABNORMAL LOW (ref 136–145)

## 2012-10-17 LAB — CULTURE, BLOOD (SINGLE)

## 2012-11-22 ENCOUNTER — Encounter: Payer: Self-pay | Admitting: Emergency Medicine

## 2012-11-22 ENCOUNTER — Ambulatory Visit (INDEPENDENT_AMBULATORY_CARE_PROVIDER_SITE_OTHER): Payer: Medicare Other | Admitting: Emergency Medicine

## 2012-11-22 VITALS — BP 102/62 | HR 84 | Temp 97.7°F | Ht 66.0 in | Wt 166.8 lb

## 2012-11-22 DIAGNOSIS — J449 Chronic obstructive pulmonary disease, unspecified: Secondary | ICD-10-CM

## 2012-11-22 DIAGNOSIS — J441 Chronic obstructive pulmonary disease with (acute) exacerbation: Secondary | ICD-10-CM | POA: Insufficient documentation

## 2012-11-22 MED ORDER — BENZONATATE 100 MG PO CAPS: 100.0000 mg | ORAL_CAPSULE | Freq: Three times a day (TID) | ORAL | Status: DC | PRN

## 2012-11-22 NOTE — Progress Notes (Signed)
Subjective:    Patient ID: Dylan Knight, male    DOB: [birth year], 79 y.o.   MRN: [id number]  HPI 79 yo former smoker, 60+ pk-yrs, hx DM, PVD (fem bypass), AAA with stenting, HTN, CVA '05. Was dx with COPD in 12/13 when he was admitted to [facility] for CAP and a flare. He was discharged on Advair and combivent, but he didn't stay on these - poor coordination taking HFA's. He is typically able to exert himself without stopping. Has a daily frequent cough and has had this for over 20 yrs. Productive of white mucous. He doesn't hear wheezing. He has had many episodes of PNA vs AE, averages about once a year. Has been admitted 3 times in the past. Not on any inhaled medications right now, feels at baseline.    Review of Systems  Constitutional: Negative for fever and unexpected weight change.  HENT: Positive for congestion. Negative for ear pain, nosebleeds, sore throat, rhinorrhea, sneezing, trouble swallowing, dental problem, postnasal drip and sinus pressure.   Eyes: Negative for redness and itching.  Respiratory: Positive for cough, chest tightness, shortness of breath and wheezing.   Cardiovascular: Negative for palpitations and leg swelling.  Gastrointestinal: Negative for nausea and vomiting.  Genitourinary: Negative for dysuria.  Musculoskeletal: Negative for joint swelling.  Skin: Negative for rash.  Neurological: Negative for headaches.  Hematological: Does not bruise/bleed easily.  Psychiatric/Behavioral: Negative for dysphoric mood. The patient is not nervous/anxious.     Past Medical History  Diagnosis Date  . Hypertension   . Peripheral vascular disease   . Hyperlipidemia   . BPH (benign prostatic hyperplasia)   . COPD (chronic obstructive pulmonary disease)   . Peripheral arterial disease 2002  . Stroke 2005  . Diabetes mellitus 2011    Border line     Family History  Problem Relation Age of Onset  . Stroke Mother   . Hypertension Mother   . Heart disease Father        History   Social History  . Marital Status: Married    Spouse Name: N/A    Number of Children: N/A  . Years of Education: N/A   Occupational History  . Not on file.   Social History Main Topics  . Smoking status: Former Smoker -- 1.0 packs/day for 63 years    Types: Cigarettes    Quit date: 04/09/2004  . Smokeless tobacco: Never Used  . Alcohol Use: No  . Drug Use: No  . Sexually Active: Not on file   Other Topics Concern  . Not on file   Social History Narrative  . No narrative on file     Allergies  Allergen Reactions  . Iohexol Hives    Went away with benadryl      Outpatient Prescriptions Prior to Visit  Medication Sig Dispense Refill  . cholecalciferol (VITAMIN D) 1000 UNITS tablet Take 1,000 Units by mouth daily.      . clopidogrel (PLAVIX) 75 MG tablet Take 75 mg by mouth daily.      Marland Kitchen ezetimibe-simvastatin (VYTORIN) 10-40 MG per tablet Take 1 tablet by mouth at bedtime.      Marland Kitchen guaiFENesin (MUCINEX) 600 MG 12 hr tablet Take 1,200 mg by mouth 2 (two) times daily.      Marland Kitchen KLOR-CON M10 10 MEQ tablet Take by mouth 2 (two) times daily.      Marland Kitchen loratadine (CLARITIN) 10 MG tablet Take 10 mg by mouth daily.      Marland Kitchen  Multiple Vitamins-Minerals (PRESERVISION AREDS PO) Take by mouth 2 (two) times daily.      . multivitamin-iron-minerals-folic acid (CENTRUM) chewable tablet Chew 1 tablet by mouth daily.      Marland Kitchen propylthiouracil (PTU) 50 MG tablet Take 100 mg by mouth every morning.      . [DISCONTINUED] ATENOLOL-CHLORTHALIDONE PO Take by mouth every morning.      . [DISCONTINUED] pantoprazole (PROTONIX) 40 MG tablet Take 40 mg by mouth daily.      . [DISCONTINUED] Tamsulosin HCl (FLOMAX) 0.4 MG CAPS Take by mouth daily after supper.      . [DISCONTINUED] tolterodine (DETROL LA) 4 MG 24 hr capsule Take 4 mg by mouth daily.      Last reviewed on 11/22/2012  3:20 PM by Darrell Jewel, CMA       Objective:   Physical Exam  Filed Vitals:   11/22/12 1514  BP:  102/62  Pulse: 84  Temp: 97.7 F (36.5 C)   Gen: Pleasant, elderly man, in no distress,  normal affect  ENT: No lesions,  mouth clear,  oropharynx clear, no postnasal drip  Neck: No JVD, no TMG, no carotid bruits  Lungs: No use of accessory muscles, distant, clear without rales or rhonchi  Cardiovascular: RRR, heart sounds normal, no murmur or gallops, no peripheral edema  Musculoskeletal: No deformities, no cyanosis or clubbing  Neuro: alert, non focal  Skin: Warm, no lesions or rash     Assessment & Plan:  COPD (chronic obstructive pulmonary disease) Presumed COPD based on history. Need to perform PFT to quantify AFL. Suspect he will benefit from BD's. May not be able to do HFA's because of poor coordination.  - full PFT - refill tessalon pearles - start BD';s next time based on the above - rov next available

## 2012-11-22 NOTE — Patient Instructions (Addendum)
We will perform full pulmonary function testing at your next office visit Follow with Dr Delton Coombes at next available appointment with full PFT Use tessalon pearles up to 3 times a day if needed for cough

## 2012-11-22 NOTE — Assessment & Plan Note (Signed)
Presumed COPD based on history. Need to perform PFT to quantify AFL. Suspect he will benefit from BD's. May not be able to do HFA's because of poor coordination.  - full PFT - refill tessalon pearles - start BD';s next time based on the above - rov next available

## 2012-12-13 ENCOUNTER — Encounter: Payer: Self-pay | Admitting: Emergency Medicine

## 2012-12-13 ENCOUNTER — Ambulatory Visit (INDEPENDENT_AMBULATORY_CARE_PROVIDER_SITE_OTHER): Payer: Medicare Other | Admitting: Emergency Medicine

## 2012-12-13 VITALS — BP 108/60 | HR 72 | Temp 97.6°F | Ht 66.0 in | Wt 167.0 lb

## 2012-12-13 DIAGNOSIS — J449 Chronic obstructive pulmonary disease, unspecified: Secondary | ICD-10-CM

## 2012-12-13 LAB — PULMONARY FUNCTION TEST

## 2012-12-13 MED ORDER — IPRATROPIUM BROMIDE 0.02 % IN SOLN: 500.0000 ug | Freq: Four times a day (QID) | RESPIRATORY_TRACT | Status: DC

## 2012-12-13 MED ORDER — ALBUTEROL SULFATE 0.63 MG/3ML IN NEBU: 1.0000 | INHALATION_SOLUTION | Freq: Four times a day (QID) | RESPIRATORY_TRACT | Status: DC

## 2012-12-13 NOTE — Progress Notes (Signed)
  Subjective:    Patient ID: Dylan Knight, male    DOB: 12-11-1923, 79 y.o.   MRN: [id number]  HPI 79 yo former smoker, 60+ pk-yrs, hx DM, PVD (fem bypass), AAA with stenting, HTN, CVA '05. Was dx with COPD in 12/13 when he was admitted to [facility] for CAP and a flare. He was discharged on Advair and combivent, but he didn't stay on these - poor coordination taking HFA's. He is typically able to exert himself without stopping. Has a daily frequent cough and has had this for over 20 yrs. Productive of white mucous. He doesn't hear wheezing. He has had many episodes of PNA vs AE, averages about once a year. Has been admitted 3 times in the past. Not on any inhaled medications right now, feels at baseline.   ROV 12/13/12 -- follow up for COPD and cough, frequent exacerbations. Had PFT today >> moderately severe AFL, no BD response, normal volumes, decreased DLCO that corrects for Va.  Feels that he has been doing well since last time. Coughs occasionally during the day. The cough is better at night on the tessalon. No clear heartburn sx. Not currently on BD's - difficulty doing them due to coordination. He is taking mucinex-D. No real nasal or head congestion.      Objective:   Physical Exam  Filed Vitals:   12/13/12 1158  BP: 108/60  Pulse: 72  Temp: 97.6 F (36.4 C)   Gen: Pleasant, elderly man, in no distress,  normal affect  ENT: No lesions,  mouth clear,  oropharynx clear, no postnasal drip  Neck: No JVD, no TMG, no carotid bruits  Lungs: No use of accessory muscles, distant, clear without rales or rhonchi  Cardiovascular: RRR, heart sounds normal, no murmur or gallops, no peripheral edema  Musculoskeletal: No deformities, no cyanosis or clubbing  Neuro: alert, non focal  Skin: Warm, no lesions or rash     Assessment & Plan:  COPD (chronic obstructive pulmonary disease) 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 Has freq cough and bronchitis sx.  - start duonebs qid, consider a  change to an alternative in the future depending on whether he is able to administer; also consider brovana/pulmicort - change mucinex-d to mucinex alone - trial chlorpheniramine or brompheniramine for decongestant - rov 1 month

## 2012-12-13 NOTE — Assessment & Plan Note (Signed)
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 Has freq cough and bronchitis sx.  - start duonebs qid, consider a change to an alternative in the future depending on whether he is able to administer; also consider brovana/pulmicort - change mucinex-d to mucinex alone - trial chlorpheniramine or brompheniramine for decongestant - rov 1 month

## 2012-12-13 NOTE — Progress Notes (Signed)
PFT done today. 

## 2012-12-13 NOTE — Patient Instructions (Addendum)
Stop mucinex-D and start mucinex 600mg  twice a day Start albuterol/ipratropium nebulizers 4 times a day until our next visit Try using decongestants that contain either chlorpheniramine or brompheniramine Follow with Dr Delton Coombes in 1 month to review your symptoms.

## 2012-12-16 ENCOUNTER — Telehealth: Payer: Self-pay | Admitting: Emergency Medicine

## 2012-12-16 DIAGNOSIS — J449 Chronic obstructive pulmonary disease, unspecified: Secondary | ICD-10-CM

## 2012-12-16 NOTE — Telephone Encounter (Signed)
Thank you :)

## 2012-12-16 NOTE — Telephone Encounter (Signed)
Called, spoke with pt's spouse.  Would like an order for Silver Sneakers program at [facility] if RB feels this is an appropriate program for pt.  Dr. Delton Coombes, pls advise.  Thank you.  ** Pls leave detailed msg if no answer per spouse request.  They will call back with any questions.

## 2012-12-16 NOTE — Telephone Encounter (Signed)
Order has been placed for the silver sneakers at [facility] per pts request. Nothing further is needed.

## 2012-12-16 NOTE — Telephone Encounter (Signed)
Yes please make this referral

## 2012-12-19 ENCOUNTER — Telehealth: Payer: Self-pay | Admitting: Emergency Medicine

## 2012-12-19 DIAGNOSIS — J449 Chronic obstructive pulmonary disease, unspecified: Secondary | ICD-10-CM

## 2012-12-19 NOTE — Telephone Encounter (Signed)
Please advise RB thanks 

## 2012-12-20 NOTE — Telephone Encounter (Signed)
Spoke with pt's spouse She states that pt is fine with referral to pulm rehab and I have sent an order to [facility] for this

## 2012-12-20 NOTE — Telephone Encounter (Signed)
If the patient agrees and if it is covered by her insurance.

## 2012-12-20 NOTE — Telephone Encounter (Signed)
Pt's wife returned call. Kathleen W Perdue °

## 2012-12-20 NOTE — Telephone Encounter (Signed)
lmomtcb for pt 

## 2012-12-26 ENCOUNTER — Other Ambulatory Visit: Payer: Self-pay | Admitting: Emergency Medicine

## 2013-01-11 ENCOUNTER — Ambulatory Visit: Payer: Medicare Other | Admitting: Emergency Medicine

## 2013-01-27 ENCOUNTER — Ambulatory Visit (INDEPENDENT_AMBULATORY_CARE_PROVIDER_SITE_OTHER): Payer: Medicare Other | Admitting: Emergency Medicine

## 2013-01-27 ENCOUNTER — Encounter: Payer: Self-pay | Admitting: Emergency Medicine

## 2013-01-27 VITALS — BP 118/70 | HR 89 | Temp 97.3°F | Ht 66.0 in | Wt 171.4 lb

## 2013-01-27 DIAGNOSIS — J209 Acute bronchitis, unspecified: Secondary | ICD-10-CM

## 2013-01-27 DIAGNOSIS — J449 Chronic obstructive pulmonary disease, unspecified: Secondary | ICD-10-CM

## 2013-01-27 MED ORDER — AZITHROMYCIN 250 MG PO TABS: 250.0000 mg | ORAL_TABLET | Freq: Every day | ORAL | Status: DC

## 2013-01-27 MED ORDER — BENZONATATE 100 MG PO CAPS: 200.0000 mg | ORAL_CAPSULE | Freq: Three times a day (TID) | ORAL | Status: DC | PRN

## 2013-01-27 NOTE — Assessment & Plan Note (Signed)
Continue duonebs qid

## 2013-01-27 NOTE — Patient Instructions (Addendum)
Please continue your albuterol and ipratropium nebulizers 4 times a day (you can mix these together) Take azithromycin as directed Use tesalon perles as needed for cough Follow with Dr Delton Coombes in 3 months or sooner if you have any problems.

## 2013-01-27 NOTE — Assessment & Plan Note (Addendum)
-   azithro for acute bronchitis in absence of bronchospasm  - tessalon perles

## 2013-01-27 NOTE — Progress Notes (Signed)
  Subjective:    Patient ID: Dylan Knight, male    DOB: Aug 18, 1924, 79 y.o.   MRN: [id number]  HPI 79 yo former smoker, 60+ pk-yrs, hx DM, PVD (fem bypass), AAA with stenting, HTN, CVA '05. Was dx with COPD in 12/13 when he was admitted to [facility] for CAP and a flare. He was discharged on Advair and combivent, but he didn't stay on these - poor coordination taking HFA's. He is typically able to exert himself without stopping. Has a daily frequent cough and has had this for over 20 yrs. Productive of white mucous. He doesn't hear wheezing. He has had many episodes of PNA vs AE, averages about once a year. Has been admitted 3 times in the past. Not on any inhaled medications right now, feels at baseline.   ROV 77/4/14 -- follow up for COPD and cough, frequent exacerbations. Had PFT today >> moderately severe AFL, no BD response, normal volumes, decreased DLCO that corrects for Va.  Feels that he has been doing well since last time. Coughs occasionally during the day. The cough is better at night on the tessalon. No clear heartburn sx. Not currently on BD's - difficulty doing them due to coordination. He is taking mucinex-D. No real nasal or head congestion.   ROV 77/21/14 -- COPD with mod severe AFL. He had a URI 2 weeks ago, caught from her wife. He is still having green phlegm. We started Duonebs last time > he didn't like them, didn't fell a benefit. He did them for 5-6 days and then stopped. He remains on mucinex. Benefited from tessalon perles.      Objective:   Physical Exam  Filed Vitals:   01/27/13 1541  BP: 118/70  Pulse: 89  Temp: 97.3 F (36.3 C)   Gen: Pleasant, elderly man, in no distress,  normal affect  ENT: No lesions,  mouth clear,  oropharynx clear, no postnasal drip  Neck: No JVD, no TMG, no carotid bruits  Lungs: No use of accessory muscles, distant, clear without rales or rhonchi  Cardiovascular: RRR, heart sounds normal, no murmur or gallops, no peripheral  edema  Musculoskeletal: No deformities, no cyanosis or clubbing  Neuro: alert, non focal  Skin: Warm, no lesions or rash     Assessment & Plan:  Acute bronchitis - azithro for acute bronchitis in absence of bronchospasm  - tessalon perles  COPD (chronic obstructive pulmonary disease) Continue duonebs qid

## 2013-02-13 ENCOUNTER — Encounter (INDEPENDENT_AMBULATORY_CARE_PROVIDER_SITE_OTHER): Payer: Medicare Other | Admitting: Ophthalmology

## 2013-02-17 ENCOUNTER — Encounter (INDEPENDENT_AMBULATORY_CARE_PROVIDER_SITE_OTHER): Payer: PRIVATE HEALTH INSURANCE | Admitting: Ophthalmology

## 2013-02-17 DIAGNOSIS — E11319 Type 2 diabetes mellitus with unspecified diabetic retinopathy without macular edema: Secondary | ICD-10-CM

## 2013-02-17 DIAGNOSIS — I10 Essential (primary) hypertension: Secondary | ICD-10-CM

## 2013-02-17 DIAGNOSIS — H43819 Vitreous degeneration, unspecified eye: Secondary | ICD-10-CM

## 2013-02-17 DIAGNOSIS — H2510 Age-related nuclear cataract, unspecified eye: Secondary | ICD-10-CM

## 2013-02-17 DIAGNOSIS — H3530 Unspecified macular degeneration: Secondary | ICD-10-CM

## 2013-02-17 DIAGNOSIS — H35039 Hypertensive retinopathy, unspecified eye: Secondary | ICD-10-CM

## 2013-02-17 DIAGNOSIS — E1139 Type 2 diabetes mellitus with other diabetic ophthalmic complication: Secondary | ICD-10-CM

## 2013-02-17 DIAGNOSIS — E1165 Type 2 diabetes mellitus with hyperglycemia: Secondary | ICD-10-CM

## 2013-04-18 ENCOUNTER — Encounter: Payer: Self-pay | Admitting: Emergency Medicine

## 2013-04-18 ENCOUNTER — Ambulatory Visit (INDEPENDENT_AMBULATORY_CARE_PROVIDER_SITE_OTHER): Payer: Medicare Other | Admitting: Emergency Medicine

## 2013-04-18 VITALS — BP 110/60 | HR 80 | Temp 98.0°F | Ht 65.0 in | Wt 172.6 lb

## 2013-04-18 DIAGNOSIS — J449 Chronic obstructive pulmonary disease, unspecified: Secondary | ICD-10-CM

## 2013-04-18 DIAGNOSIS — R05 Cough: Secondary | ICD-10-CM

## 2013-04-18 DIAGNOSIS — R059 Cough, unspecified: Secondary | ICD-10-CM

## 2013-04-18 MED ORDER — BENZONATATE 100 MG PO CAPS: 200.0000 mg | ORAL_CAPSULE | Freq: Three times a day (TID) | ORAL | Status: DC | PRN

## 2013-04-18 NOTE — Assessment & Plan Note (Signed)
-   loratadine - refill tessalon

## 2013-04-18 NOTE — Assessment & Plan Note (Signed)
-   discussed increasing duonebs to 4x a day, he will do a trial of this.

## 2013-04-18 NOTE — Progress Notes (Signed)
  Subjective:    Patient ID: Dylan Knight, male    DOB: 26-Jul-1924, 79 y.o.   MRN: [id number]  HPI 79 yo former smoker, 60+ pk-yrs, hx DM, PVD (fem bypass), AAA with stenting, HTN, CVA '05. Was dx with COPD in 12/13 when he was admitted to [facility] for CAP and a flare. He was discharged on Advair and combivent, but he didn't stay on these - poor coordination taking HFA's. He is typically able to exert himself without stopping. Has a daily frequent cough and has had this for over 20 yrs. Productive of white mucous. He doesn't hear wheezing. He has had many episodes of PNA vs AE, averages about once a year. Has been admitted 3 times in the past. Not on any inhaled medications right now, feels at baseline.   ROV 12/13/12 -- follow up for COPD and cough, frequent exacerbations. Had PFT today >> moderately severe AFL, no BD response, normal volumes, decreased DLCO that corrects for Va.  Feels that he has been doing well since last time. Coughs occasionally during the day. The cough is better at night on the tessalon. No clear heartburn sx. Not currently on BD's - difficulty doing them due to coordination. He is taking mucinex-D. No real nasal or head congestion.   ROV 01/27/13 -- COPD with mod severe AFL. He had a URI 2 weeks ago, caught from her wife. He is still having green phlegm. We started Duonebs last time > he didn't like them, didn't fell a benefit. He did them for 5-6 days and then stopped. He remains on mucinex. Benefited from tessalon perles.   ROV 04/18/13 -- COPD with mod severe AFL.  He is using DuoNebs bid, still coughs some phlegm but much better than last time.      Objective:   Physical Exam  Filed Vitals:   04/18/13 1451  BP: 110/60  Pulse: 80  Temp: 98 F (36.7 C)   Gen: Pleasant, elderly man, in no distress,  normal affect  ENT: No lesions,  mouth clear,  oropharynx clear, no postnasal drip  Neck: No JVD, no TMG, no carotid bruits  Lungs: No use of accessory muscles,  distant, clear without rales or rhonchi  Cardiovascular: RRR, heart sounds normal, no murmur or gallops, no peripheral edema  Musculoskeletal: No deformities, no cyanosis or clubbing  Neuro: alert, non focal  Skin: Warm, no lesions or rash     Assessment & Plan:  COPD (chronic obstructive pulmonary disease) - discussed increasing duonebs to 4x a day, he will do a trial of this.   Cough - loratadine - refill tessalon

## 2013-04-18 NOTE — Patient Instructions (Addendum)
Please continue your albuterol + ipratropium nebulizer. You can increase this up to 4 times a day.  Continue your loratadine 10mg  daily.  Use Tessalon Perles for coughing.  Follow with Dr Delton Coombes in 6 months or sooner if you have any problems

## 2013-06-16 ENCOUNTER — Encounter (INDEPENDENT_AMBULATORY_CARE_PROVIDER_SITE_OTHER): Payer: PRIVATE HEALTH INSURANCE | Admitting: Ophthalmology

## 2013-06-16 DIAGNOSIS — E11319 Type 2 diabetes mellitus with unspecified diabetic retinopathy without macular edema: Secondary | ICD-10-CM

## 2013-06-16 DIAGNOSIS — H2510 Age-related nuclear cataract, unspecified eye: Secondary | ICD-10-CM

## 2013-06-16 DIAGNOSIS — H3530 Unspecified macular degeneration: Secondary | ICD-10-CM

## 2013-06-16 DIAGNOSIS — H35039 Hypertensive retinopathy, unspecified eye: Secondary | ICD-10-CM

## 2013-06-16 DIAGNOSIS — H43819 Vitreous degeneration, unspecified eye: Secondary | ICD-10-CM

## 2013-06-16 DIAGNOSIS — E1139 Type 2 diabetes mellitus with other diabetic ophthalmic complication: Secondary | ICD-10-CM

## 2013-06-16 DIAGNOSIS — I10 Essential (primary) hypertension: Secondary | ICD-10-CM

## 2013-06-27 ENCOUNTER — Encounter: Payer: Self-pay | Admitting: Family

## 2013-06-28 ENCOUNTER — Other Ambulatory Visit (INDEPENDENT_AMBULATORY_CARE_PROVIDER_SITE_OTHER): Payer: Medicare Other | Admitting: *Deleted

## 2013-06-28 ENCOUNTER — Encounter: Payer: Self-pay | Admitting: Family

## 2013-06-28 ENCOUNTER — Ambulatory Visit (INDEPENDENT_AMBULATORY_CARE_PROVIDER_SITE_OTHER): Payer: Medicare Other | Admitting: Family

## 2013-06-28 ENCOUNTER — Encounter: Payer: Self-pay | Admitting: Vascular Surgery

## 2013-06-28 ENCOUNTER — Ambulatory Visit: Payer: Medicare Other | Admitting: Vascular Surgery

## 2013-06-28 ENCOUNTER — Encounter (INDEPENDENT_AMBULATORY_CARE_PROVIDER_SITE_OTHER): Payer: Medicare Other | Admitting: *Deleted

## 2013-06-28 VITALS — BP 115/63 | HR 78 | Resp 16 | Ht 66.0 in | Wt 175.3 lb

## 2013-06-28 DIAGNOSIS — I6529 Occlusion and stenosis of unspecified carotid artery: Secondary | ICD-10-CM

## 2013-06-28 DIAGNOSIS — I739 Peripheral vascular disease, unspecified: Secondary | ICD-10-CM

## 2013-06-28 DIAGNOSIS — Z48812 Encounter for surgical aftercare following surgery on the circulatory system: Secondary | ICD-10-CM | POA: Insufficient documentation

## 2013-06-28 NOTE — Progress Notes (Signed)
Established Carotid Patient  History of Present Illness Dylan Knight is a 79 y.o. male who has  had a previous right carotid endarterectomy in 2007 and has no evidence of recurrent right carotid stenosis. The patient has also had a left internal carotid artery stent. This patient has also undergone previous repair of an abdominal aortic aneurysm in 2003. Also has a right femoropopliteal bypass graft which has been revised in 2005. He complains of pain in left hip only after walking for 10 minutes. He denies ulcers or slow healing wounds. His worst complaint seems to be his low back and hip pain which has been evaluated by Dr. Ophelia Charter and Dr. Charlann Boxer a few years ago.  Patient has Negative history of TIA or stroke symptom since 2005.  The patient denies amaurosis fugax or monocular blindness.  The patient  denies facial drooping. Pt. denies hemiplegia.  The patient denies receptive or expressive aphasia.   The patient's previous neurologic deficits are Unchanged.  Patient denies new Medical or Surgical History:   Pt Diabetic: Yes Pt smoker: No  Pt meds include: Statin : Yes Betablocker: Yes ASA: No:  Other anticoagulants/antiplatelets: Plavix   Past Medical History  Diagnosis Date  . Hypertension   . Peripheral vascular disease   . Hyperlipidemia   . BPH (benign prostatic hyperplasia)   . COPD (chronic obstructive pulmonary disease)   . Peripheral arterial disease 2002  . Stroke 2005  . Diabetes mellitus 2011    Border line    Social History History  Substance Use Topics  . Smoking status: Former Smoker -- 1.00 packs/day for 63 years    Types: Cigarettes    Quit date: 04/09/2004  . Smokeless tobacco: Never Used  . Alcohol Use: No    Family History Family History  Problem Relation Age of Onset  . Stroke Mother   . Hypertension Mother   . Heart disease Father   . Heart attack Father   . Cancer Brother   . Hypertension Brother     Surgical History Past Surgical  History  Procedure Laterality Date  . Spine surgery  1960    lumbar spine  . Spine surgery  1980, 2007    cervical spine  . Abdominal aortic aneurysm repair  2003    Aorto-left femoral-right iliac BPG by Dr. Edilia Bo  . Toe amputation  08-2001    right 5th toe amputation  . Pr vein bypass graft,aorto-fem-pop  2005    Revision Right Fem-pop  . Pr vein bypass graft,aorto-fem-pop  2002    Right Fem-pop  . Carotid endarterectomy  Jan. 25,2007    Right CEA    Allergies  Allergen Reactions  . Iohexol Hives    Went away with benadryl     Current Outpatient Prescriptions  Medication Sig Dispense Refill  . albuterol (ACCUNEB) 0.63 MG/3ML nebulizer solution Take 3 mLs (0.63 mg total) by nebulization 4 (four) times daily.  75 mL  6  . benzonatate (TESSALON) 100 MG capsule Take 2 capsules (200 mg total) by mouth 3 (three) times daily as needed for cough.  30 capsule  5  . clopidogrel (PLAVIX) 75 MG tablet Take 75 mg by mouth daily.      Marland Kitchen ezetimibe-simvastatin (VYTORIN) 10-40 MG per tablet Take 1 tablet by mouth at bedtime.      Marland Kitchen ipratropium (ATROVENT) 0.02 % nebulizer solution Take 2.5 mLs (500 mcg total) by nebulization 4 (four) times daily.  75 mL  6  . loratadine (CLARITIN) 10  MG tablet Take 10 mg by mouth daily.      . Multiple Vitamins-Minerals (CENTRUM SILVER PO) Take 1 tablet by mouth daily.      . Multiple Vitamins-Minerals (PRESERVISION AREDS PO) Take by mouth 2 (two) times daily.      Marland Kitchen propylthiouracil (PTU) 50 MG tablet Take 100 mg by mouth every morning.      . sitaGLIPtan-metformin (JANUMET) 50-500 MG per tablet Take 1 tablet by mouth 2 (two) times daily with a meal.      . atenolol (TENORMIN) 25 MG tablet Take 25 mg by mouth daily.      Marland Kitchen atenolol-chlorthalidone (TENORETIC) 50-25 MG per tablet daily.       No current facility-administered medications for this visit.    Review of Systems : [x]  Positive   [ ]  Denies  General:[ ]  Weight loss,  [ ]  Weight gain, [ ]  Loss  of appetite, [ ]  Fever, [ ]  chills  Neurologic: [ ]  Dizziness, [ ]  Blackouts, [ ]  Headaches, [ ]  Seizure [ ]  Stroke, [ ]  "Mini stroke", [ ]  Slurred speech, [ ]  Temporary blindness;  [ ] weakness,  Ear/Nose/Throat: [ ]  Change in hearing, [ ]  Nose bleeds, [ ]  Hoarseness  Vascular:[ ]  Pain in legs with walking, [ ]  Pain in feet while lying flat , [ ]   Non-healing ulcer, [ ]  Blood clot in vein,    Pulmonary: [ ]  Home oxygen, [ ]   Productive cough, [ ]  Bronchitis, [ ]  Coughing up blood,  [ ]  Asthma, [ ]  Wheezing  Musculoskeletal:  [ ]  Arthritis, Arly.Keller ] Joint pain, Arly.Keller ] low back pain  Cardiac: [ ]  Chest pain, [ ]  Shortness of breath when lying flat, Arly.Keller ] Shortness of breath with exertion, [ ]  Palpitations, [ ]  Heart murmur, [ ]   Atrial fibrillation  Hematologic:[ ]  Easy Bruising, [ ]  Anemia; [ ]  Hepatitis  Psychiatric: [ ]   Depression, [ ]  Anxiety   Gastrointestinal: [ ]  Black stool, [ ]  Blood in stool, [ ]  Peptic ulcer disease,  [ ]  Gastroesophageal Reflux, [ ]  Trouble swallowing, [ ]  Diarrhea, [ ]  Constipation  Urinary: [ ]  chronic Kidney disease, [ ]  on HD, [ ]  Burning with urination, [ ]  Frequent urination, [ ]  Difficulty urinating;   Skin: [ ]  Rashes, [ ]  Wounds    Physical Examination  Filed Vitals:   06/28/13 1458  BP: 115/63  Pulse:   Resp:     General: WDWN male in NAD GAIT: antalgic Eyes: PERRLA Pulmonary:  CTAB, Negative  Rales, Negative rhonchi, & Negative wheezing,  Cardiac: regular Rhythm ,  Negative Murmurs,   Vascular:      Vessel Right Left  Radial Palpable Palpable  Carotid Audible, without bruit Audible, without bruit  Aorta Not palpable N/A  Femoral Not Palpable Palpable  Popliteal Not palpable Not palpable  PT Not Palpable Not Palpable  DP Palpable Palpable    Gastrointestinal: soft, nontender, BS WNL, no r/g,  negative masses;   Musculoskeletal: Negative muscle atrophy/wasting. M/S 5/5 throughout, Extremities without ischemic changes. Right 5th toe  s/p amputation.  Neurologic: A&O X 3; Appropriate Affect ; SENSATION ;normal; MOTOR FUNCTION: normal 5/5 strength in all tested muscle groups Speech is normal CN 2-12 intact , Pain and light touch intact in extremities , Motor exam as listed above   Non-Invasive Vascular Imaging CAROTID DUPLEX 06/28/2013  Right ICA: Patent right CE site without evidence for restenosis. Left ICA: Patent stent. These findings are Unchanged from  previous exam  Lower Extremity Arterial Duplex Exam: Patent right bypass graft Left ABI: .74, improved from .61 a year ago Right ABI: 1.15, compared to 1.01 a year ago No significant change in a year.  Assessment: Dylan Knight is a 79 y.o. male who presents for surveillance post right carotid endarterectomy in 2007 and has no evidence of recurrent right carotid stenosis. The patient has also had a left internal carotid artery stent which remains patent. This patient has also undergone previous repair of an abdominal aortic aneurysm in 2003. Also has a right femoropopliteal bypass graft which has been revised in 2005. His bypass graft remains patent with no significant change in a year.   Plan: After discussing with Dr. Edilia Bo today's vascular lab results and findings on history and physical exam, it was decided that patient should follow-up in 1 year with Carotid Duplex scan and LE arterial Duplex   I discussed in depth with the patient the nature of atherosclerosis, and emphasized the importance of maximal medical management including strict control of blood pressure, blood glucose, and lipid levels, obtaining regular exercise, and cessation of smoking.  The patient is aware that without maximal medical management the underlying atherosclerotic disease process will progress, limiting the benefit of any interventions. Pt was given verbal information regarding stroke symptoms and prevention and PAD symptoms and prevention.  Charisse March, RN, MSN, FNP-C [facility] Office: [phone]  Clinic Physician: Edilia Bo  06/28/2013 3:15 PM

## 2013-10-07 LAB — URINALYSIS, COMPLETE
Bacteria: NONE SEEN
Bilirubin,UR: NEGATIVE
Blood: NEGATIVE
Glucose,UR: NEGATIVE mg/dL (ref 0–75)
Ketone: NEGATIVE
Leukocyte Esterase: NEGATIVE
Nitrite: NEGATIVE
Ph: 7 (ref 4.5–8.0)
Protein: NEGATIVE
RBC,UR: 1 /HPF (ref 0–5)
Specific Gravity: 1.008 (ref 1.003–1.030)
Squamous Epithelial: <1
WBC UR: <1 /HPF (ref 0–5)

## 2013-10-07 LAB — BASIC METABOLIC PANEL
Anion Gap: 8 (ref 7–16)
BUN: 13 mg/dL (ref 7–18)
Calcium, Total: 9.1 mg/dL (ref 8.5–10.1)
Chloride: 93 mmol/L — ABNORMAL LOW (ref 98–107)
Co2: 29 mmol/L (ref 21–32)
Creatinine: 1.26 mg/dL (ref 0.60–1.30)
EGFR (African American): 59 — ABNORMAL LOW
EGFR (Non-African Amer.): 51 — ABNORMAL LOW
Glucose: 139 mg/dL — ABNORMAL HIGH (ref 65–99)
Osmolality: 263 (ref 275–301)
Potassium: 3.2 mmol/L — ABNORMAL LOW (ref 3.5–5.1)
Sodium: 130 mmol/L — ABNORMAL LOW (ref 136–145)

## 2013-10-07 LAB — COMPREHENSIVE METABOLIC PANEL
Albumin: 3.6 g/dL (ref 3.4–5.0)
Alkaline Phosphatase: 63 U/L
Anion Gap: 6 — ABNORMAL LOW (ref 7–16)
BUN: 13 mg/dL (ref 7–18)
Bilirubin,Total: 1.5 mg/dL — ABNORMAL HIGH (ref 0.2–1.0)
Calcium, Total: 9.2 mg/dL (ref 8.5–10.1)
Chloride: 92 mmol/L — ABNORMAL LOW (ref 98–107)
Co2: 31 mmol/L (ref 21–32)
Creatinine: 1.31 mg/dL — ABNORMAL HIGH (ref 0.60–1.30)
EGFR (African American): 56 — ABNORMAL LOW
EGFR (Non-African Amer.): 49 — ABNORMAL LOW
Glucose: 175 mg/dL — ABNORMAL HIGH (ref 65–99)
Osmolality: 263 (ref 275–301)
Potassium: 3 mmol/L — ABNORMAL LOW (ref 3.5–5.1)
SGOT(AST): 30 U/L (ref 15–37)
SGPT (ALT): 24 U/L (ref 12–78)
Sodium: 129 mmol/L — ABNORMAL LOW (ref 136–145)
Total Protein: 7.2 g/dL (ref 6.4–8.2)

## 2013-10-07 LAB — PROTIME-INR
INR: 1
Prothrombin Time: 13.4 secs (ref 11.5–14.7)

## 2013-10-07 LAB — CK TOTAL AND CKMB (NOT AT ARMC)
CK, Total: 124 U/L (ref 35–232)
CK-MB: 3.3 ng/mL (ref 0.5–3.6)

## 2013-10-07 LAB — CBC
HCT: 44.7 % (ref 40.0–52.0)
HGB: 16.1 g/dL (ref 13.0–18.0)
MCH: 34.1 pg — ABNORMAL HIGH (ref 26.0–34.0)
MCHC: 36 g/dL (ref 32.0–36.0)
MCV: 95 fL (ref 80–100)
Platelet: 217 10*3/uL (ref 150–440)
RBC: 4.72 10*6/uL (ref 4.40–5.90)
RDW: 13.6 % (ref 11.5–14.5)
WBC: 9.9 10*3/uL (ref 3.8–10.6)

## 2013-10-07 LAB — TSH: Thyroid Stimulating Horm: 6.31 u[IU]/mL — ABNORMAL HIGH

## 2013-10-07 LAB — MAGNESIUM: Magnesium: 1.3 mg/dL — ABNORMAL LOW

## 2013-10-07 LAB — T4, FREE: Free Thyroxine: 0.86 ng/dL (ref 0.76–1.46)

## 2013-10-07 LAB — TROPONIN I: Troponin-I: <0.02 ng/mL

## 2013-10-08 LAB — CBC WITH DIFFERENTIAL/PLATELET
Basophil #: 0 10*3/uL (ref 0.0–0.1)
Basophil %: 0.4 %
Eosinophil #: 0.2 10*3/uL (ref 0.0–0.7)
Eosinophil %: 2.5 %
HCT: 45 % (ref 40.0–52.0)
HGB: 15.9 g/dL (ref 13.0–18.0)
Lymphocyte #: 0.9 10*3/uL — ABNORMAL LOW (ref 1.0–3.6)
Lymphocyte %: 9.6 %
MCH: 33.7 pg (ref 26.0–34.0)
MCHC: 35.3 g/dL (ref 32.0–36.0)
MCV: 96 fL (ref 80–100)
Monocyte #: 1.4 x10 3/mm — ABNORMAL HIGH (ref 0.2–1.0)
Monocyte %: 14.9 %
Neutrophil #: 7 10*3/uL — ABNORMAL HIGH (ref 1.4–6.5)
Neutrophil %: 72.6 %
Platelet: 187 10*3/uL (ref 150–440)
RBC: 4.7 10*6/uL (ref 4.40–5.90)
RDW: 13.5 % (ref 11.5–14.5)
WBC: 9.6 10*3/uL (ref 3.8–10.6)

## 2013-10-08 LAB — BASIC METABOLIC PANEL
Anion Gap: 5 — ABNORMAL LOW (ref 7–16)
BUN: 13 mg/dL (ref 7–18)
Calcium, Total: 9.2 mg/dL (ref 8.5–10.1)
Chloride: 96 mmol/L — ABNORMAL LOW (ref 98–107)
Co2: 30 mmol/L (ref 21–32)
Creatinine: 1.2 mg/dL (ref 0.60–1.30)
EGFR (African American): >60
EGFR (Non-African Amer.): 54 — ABNORMAL LOW
Glucose: 158 mg/dL — ABNORMAL HIGH (ref 65–99)
Osmolality: 266 (ref 275–301)
Potassium: 3.3 mmol/L — ABNORMAL LOW (ref 3.5–5.1)
Sodium: 131 mmol/L — ABNORMAL LOW (ref 136–145)

## 2013-10-09 ENCOUNTER — Inpatient Hospital Stay: Payer: Self-pay | Admitting: Internal Medicine

## 2013-10-09 LAB — BASIC METABOLIC PANEL
Anion Gap: 4 — ABNORMAL LOW (ref 7–16)
BUN: 14 mg/dL (ref 7–18)
Calcium, Total: 9.5 mg/dL (ref 8.5–10.1)
Chloride: 96 mmol/L — ABNORMAL LOW (ref 98–107)
Co2: 31 mmol/L (ref 21–32)
Creatinine: 1.27 mg/dL (ref 0.60–1.30)
EGFR (African American): 58 — ABNORMAL LOW
EGFR (Non-African Amer.): 50 — ABNORMAL LOW
Glucose: 143 mg/dL — ABNORMAL HIGH (ref 65–99)
Osmolality: 266 (ref 275–301)
Potassium: 2.9 mmol/L — ABNORMAL LOW (ref 3.5–5.1)
Sodium: 131 mmol/L — ABNORMAL LOW (ref 136–145)

## 2013-10-09 LAB — MAGNESIUM
Magnesium: 1.8 mg/dL
Magnesium: 1.7 mg/dL — ABNORMAL LOW

## 2013-10-10 LAB — BASIC METABOLIC PANEL
Anion Gap: 7 (ref 7–16)
BUN: 11 mg/dL (ref 7–18)
Calcium, Total: 8.8 mg/dL (ref 8.5–10.1)
Chloride: 98 mmol/L (ref 98–107)
Co2: 26 mmol/L (ref 21–32)
Creatinine: 1.14 mg/dL (ref 0.60–1.30)
EGFR (African American): >60
EGFR (Non-African Amer.): 58 — ABNORMAL LOW
Glucose: 176 mg/dL — ABNORMAL HIGH (ref 65–99)
Osmolality: 266 (ref 275–301)
Potassium: 3.9 mmol/L (ref 3.5–5.1)
Sodium: 131 mmol/L — ABNORMAL LOW (ref 136–145)

## 2013-10-10 LAB — MAGNESIUM: Magnesium: 2.3 mg/dL

## 2013-10-10 LAB — EXPECTORATED SPUTUM ASSESSMENT W REFEX TO RESP CULTURE

## 2013-10-12 LAB — CULTURE, BLOOD (SINGLE)

## 2013-10-13 ENCOUNTER — Encounter (INDEPENDENT_AMBULATORY_CARE_PROVIDER_SITE_OTHER): Payer: PRIVATE HEALTH INSURANCE | Admitting: Ophthalmology

## 2013-10-13 DIAGNOSIS — H35039 Hypertensive retinopathy, unspecified eye: Secondary | ICD-10-CM

## 2013-10-13 DIAGNOSIS — E11319 Type 2 diabetes mellitus with unspecified diabetic retinopathy without macular edema: Secondary | ICD-10-CM

## 2013-10-13 DIAGNOSIS — H3530 Unspecified macular degeneration: Secondary | ICD-10-CM

## 2013-10-13 DIAGNOSIS — E1139 Type 2 diabetes mellitus with other diabetic ophthalmic complication: Secondary | ICD-10-CM

## 2013-10-13 DIAGNOSIS — I10 Essential (primary) hypertension: Secondary | ICD-10-CM

## 2013-10-13 DIAGNOSIS — H43819 Vitreous degeneration, unspecified eye: Secondary | ICD-10-CM

## 2013-11-17 ENCOUNTER — Ambulatory Visit (INDEPENDENT_AMBULATORY_CARE_PROVIDER_SITE_OTHER): Payer: Medicare Other | Admitting: Emergency Medicine

## 2013-11-17 ENCOUNTER — Encounter: Payer: Self-pay | Admitting: Emergency Medicine

## 2013-11-17 VITALS — BP 140/72 | HR 80 | Ht 66.0 in | Wt 174.8 lb

## 2013-11-17 DIAGNOSIS — J449 Chronic obstructive pulmonary disease, unspecified: Secondary | ICD-10-CM

## 2013-11-17 NOTE — Progress Notes (Signed)
  Subjective:    Patient ID: Dylan Knight, male    DOB: 1924-06-13, 80 y.o.   MRN: [id number]  HPI 80 yo former smoker, 60+ pk-yrs, hx DM, PVD (fem bypass), AAA with stenting, HTN, CVA '05. Was dx with COPD in 12/13 when he was admitted to [facility] for CAP and a flare. He was discharged on Advair and combivent, but he didn't stay on these - poor coordination taking HFA's. He is typically able to exert himself without stopping. Has a daily frequent cough and has had this for over 20 yrs. Productive of white mucous. He doesn't hear wheezing. He has had many episodes of PNA vs AE, averages about once a year. Has been admitted 3 times in the past. Not on any inhaled medications right now, feels at baseline.   ROV 12/13/12 -- follow up for COPD and cough, frequent exacerbations. Had PFT today >> moderately severe AFL, no BD response, normal volumes, decreased DLCO that corrects for Va.  Feels that he has been doing well since last time. Coughs occasionally during the day. The cough is better at night on the tessalon. No clear heartburn sx. Not currently on BD's - difficulty doing them due to coordination. He is taking mucinex-D. No real nasal or head congestion.   ROV 01/27/13 -- COPD with mod severe AFL. He had a URI 2 weeks ago, caught from her wife. He is still having green phlegm. We started Duonebs last time > he didn't like them, didn't fell a benefit. He did them for 5-6 days and then stopped. He remains on mucinex. Benefited from [place].   ROV 04/18/13 -- COPD with mod severe AFL.  He is using DuoNebs bid, still coughs some phlegm but much better than last time.   ROV 11/15/13 -- COPD, moderately severe AFL.  He was unfortunately admitted to [facility] for AE-COPD and PNA, treated with abx, steroids. Happens about once a year, usually in the Fall. He is using duonebs less > about qd instead of bid (he knows he could be using 4x a day). He is active. Able to exert. Occasional cough, minimal cough.       Objective:   Physical Exam  Filed Vitals:   11/17/13 1510  BP: 140/72  Pulse: 80  Height: 5\' 6"  (1.676 m)  Weight: 174 lb 12.8 oz (79.289 kg)  SpO2: 94%   Gen: Pleasant, elderly man, in no distress,  normal affect  ENT: No lesions,  mouth clear,  oropharynx clear, no postnasal drip  Neck: No JVD, no TMG, no carotid bruits  Lungs: No use of accessory muscles, distant, clear without rales or rhonchi  Cardiovascular: RRR, heart sounds normal, no murmur or gallops, no peripheral edema  Musculoskeletal: No deformities, no cyanosis or clubbing  Neuro: alert, non focal  Skin: Warm, no lesions or rash     Assessment & Plan:  COPD (chronic obstructive pulmonary disease) - he feels compensated. Would like to continue duonebs once a day.  - rov 6

## 2013-11-17 NOTE — Patient Instructions (Signed)
Please continue your albuterol/ipratropium nebulizers once a day. If your breathing worsens we will increase the frequency of your treatments.  Follow with Dr Lamonte Sakai in 4 months or sooner if you have any problems.

## 2013-11-17 NOTE — Assessment & Plan Note (Signed)
-   he feels compensated. Would like to continue duonebs once a day.  - rov 6

## 2013-12-21 ENCOUNTER — Other Ambulatory Visit: Payer: Self-pay | Admitting: Orthopedic Surgery

## 2013-12-21 DIAGNOSIS — M545 Low back pain, unspecified: Secondary | ICD-10-CM

## 2013-12-21 DIAGNOSIS — R531 Weakness: Secondary | ICD-10-CM

## 2013-12-21 DIAGNOSIS — M79604 Pain in right leg: Secondary | ICD-10-CM

## 2013-12-21 DIAGNOSIS — M79605 Pain in left leg: Secondary | ICD-10-CM

## 2013-12-27 ENCOUNTER — Other Ambulatory Visit: Payer: Medicare Other

## 2013-12-27 ENCOUNTER — Ambulatory Visit
Admission: RE | Admit: 2013-12-27 | Discharge: 2013-12-27 | Disposition: A | Discharge status: Home / Self Care | Payer: 59 | Source: Ambulatory Visit | Attending: Orthopedic Surgery | Admitting: Orthopedic Surgery

## 2013-12-27 DIAGNOSIS — R531 Weakness: Secondary | ICD-10-CM

## 2013-12-27 DIAGNOSIS — M79604 Pain in right leg: Secondary | ICD-10-CM

## 2013-12-27 DIAGNOSIS — M545 Low back pain, unspecified: Secondary | ICD-10-CM

## 2013-12-27 DIAGNOSIS — M79605 Pain in left leg: Secondary | ICD-10-CM

## 2013-12-28 ENCOUNTER — Other Ambulatory Visit: Payer: Self-pay | Admitting: Family

## 2013-12-28 DIAGNOSIS — Z48812 Encounter for surgical aftercare following surgery on the circulatory system: Secondary | ICD-10-CM

## 2013-12-28 DIAGNOSIS — I6529 Occlusion and stenosis of unspecified carotid artery: Secondary | ICD-10-CM

## 2014-02-09 ENCOUNTER — Encounter (INDEPENDENT_AMBULATORY_CARE_PROVIDER_SITE_OTHER): Payer: 59 | Admitting: Ophthalmology

## 2014-02-09 DIAGNOSIS — I10 Essential (primary) hypertension: Secondary | ICD-10-CM

## 2014-02-09 DIAGNOSIS — H35039 Hypertensive retinopathy, unspecified eye: Secondary | ICD-10-CM

## 2014-02-09 DIAGNOSIS — E11319 Type 2 diabetes mellitus with unspecified diabetic retinopathy without macular edema: Secondary | ICD-10-CM

## 2014-02-09 DIAGNOSIS — H3530 Unspecified macular degeneration: Secondary | ICD-10-CM

## 2014-02-09 DIAGNOSIS — H43819 Vitreous degeneration, unspecified eye: Secondary | ICD-10-CM

## 2014-02-09 DIAGNOSIS — E1139 Type 2 diabetes mellitus with other diabetic ophthalmic complication: Secondary | ICD-10-CM

## 2014-02-09 DIAGNOSIS — E1165 Type 2 diabetes mellitus with hyperglycemia: Secondary | ICD-10-CM

## 2014-03-30 ENCOUNTER — Encounter: Payer: Self-pay | Admitting: Emergency Medicine

## 2014-03-30 ENCOUNTER — Ambulatory Visit (INDEPENDENT_AMBULATORY_CARE_PROVIDER_SITE_OTHER): Payer: Medicare Other | Admitting: Emergency Medicine

## 2014-03-30 VITALS — BP 130/86 | HR 80 | Ht 66.0 in | Wt 175.4 lb

## 2014-03-30 DIAGNOSIS — J449 Chronic obstructive pulmonary disease, unspecified: Secondary | ICD-10-CM

## 2014-03-30 NOTE — Patient Instructions (Signed)
We will restart your duonebs on a schedule - begin with twice a day - to see if you benefit.  Follow with Dr Lamonte Sakai in 6 months or sooner if you have any problems

## 2014-03-30 NOTE — Assessment & Plan Note (Addendum)
Not on scheduled therapy currently. Will trial restarting to see if he benefits/ he is remarkably stable on no scheduled meds. Favor restarting as he has severely exacerbated in the past  We will restart your duonebs on a schedule - begin with twice a day - to see if you benefit.  Follow with Dr Lamonte Sakai in 6 months or sooner if you have any problems

## 2014-03-30 NOTE — Progress Notes (Signed)
Subjective:    Patient ID: Dylan Knight, male    DOB: 1924/01/23, 80 y.o.   MRN: [id number]  HPI 80 yo former smoker, 60+ pk-yrs, hx DM, PVD (fem bypass), AAA with stenting, HTN, CVA '05. Was dx with COPD in 12/13 when he was admitted to [facility] for CAP and a flare. He was discharged on Advair and combivent, but he didn't stay on these - poor coordination taking HFA's. He is typically able to exert himself without stopping. Has a daily frequent cough and has had this for over 20 yrs. Productive of white mucous. He doesn't hear wheezing. He has had many episodes of PNA vs AE, averages about once a year. Has been admitted 3 times in the past. Not on any inhaled medications right now, feels at baseline.   ROV 12/13/12 -- follow up for COPD and cough, frequent exacerbations. Had PFT today >> moderately severe AFL, no BD response, normal volumes, decreased DLCO that corrects for Va.  Feels that he has been doing well since last time. Coughs occasionally during the day. The cough is better at night on the tessalon. No clear heartburn sx. Not currently on BD's - difficulty doing them due to coordination. He is taking mucinex-D. No real nasal or head congestion.   ROV 01/27/13 -- COPD with mod severe AFL. He had a URI 2 weeks ago, caught from her wife. He is still having green phlegm. We started Duonebs last time > he didn't like them, didn't fell a benefit. He did them for 5-6 days and then stopped. He remains on mucinex. Benefited from [place].   ROV 04/18/13 -- COPD with mod severe AFL.  He is using DuoNebs bid, still coughs some phlegm but much better than last time.   ROV 11/15/13 -- COPD, moderately severe AFL.  He was unfortunately admitted to [facility] for AE-COPD and PNA, treated with abx, steroids. Happens about once a year, usually in the Fall. He is using duonebs less > about qd instead of bid (he knows he could be using 4x a day). He is active. Able to exert. Occasional cough, minimal cough.    ROV 03/10/14 -- COPD, moderately severe AFL.  They have sold their house and have downsized. No more hospitalizations, no flares since last visit.  He is using duonebs about 2x a week, our plans had been to use qid. Not sure he misses it. Minimal cough or wheeze. Taking loratadine reliably.      Objective:   Physical Exam  Filed Vitals:   03/30/14 1006  BP: 130/86  Pulse: 80  Height: 5\' 6"  (1.676 m)  Weight: 175 lb 6.4 oz (79.561 kg)  SpO2: 94%   Gen: Pleasant, elderly man, in no distress,  normal affect  ENT: No lesions,  mouth clear,  oropharynx clear, no postnasal drip  Neck: No JVD, no TMG, no carotid bruits  Lungs: No use of accessory muscles, distant, clear without rales or rhonchi  Cardiovascular: RRR, heart sounds normal, no murmur or gallops, no peripheral edema  Musculoskeletal: No deformities, no cyanosis or clubbing  Neuro: alert, non focal  Skin: Warm, no lesions or rash     Assessment & Plan:  COPD (chronic obstructive pulmonary disease) Not on scheduled therapy currently. Will trial restarting to see if he benefits/ he is remarkably stable on no scheduled meds. Favor restarting as he has severely exacerbated in the past  We will restart your duonebs on a schedule - begin with twice a day -  to see if you benefit.  Follow with Dr Lamonte Sakai in 6 months or sooner if you have any problems

## 2014-04-03 ENCOUNTER — Telehealth: Payer: Self-pay | Admitting: Emergency Medicine

## 2014-04-03 MED ORDER — ALBUTEROL SULFATE 0.63 MG/3ML IN NEBU: 1.0000 | INHALATION_SOLUTION | Freq: Four times a day (QID) | RESPIRATORY_TRACT | Status: DC

## 2014-04-03 MED ORDER — IPRATROPIUM BROMIDE 0.02 % IN SOLN: 500.0000 ug | Freq: Four times a day (QID) | RESPIRATORY_TRACT | Status: DC

## 2014-04-03 NOTE — Telephone Encounter (Signed)
Pt's wife is requesting refills of Albuterol and Ipratropium be sent to [facility]. This has been done. Nothing further was needed.

## 2014-04-24 ENCOUNTER — Encounter (INDEPENDENT_AMBULATORY_CARE_PROVIDER_SITE_OTHER): Payer: Self-pay

## 2014-06-19 ENCOUNTER — Other Ambulatory Visit: Payer: Self-pay | Admitting: *Deleted

## 2014-06-19 DIAGNOSIS — Z48812 Encounter for surgical aftercare following surgery on the circulatory system: Secondary | ICD-10-CM

## 2014-06-19 DIAGNOSIS — I739 Peripheral vascular disease, unspecified: Secondary | ICD-10-CM

## 2014-07-03 ENCOUNTER — Ambulatory Visit
Admission: RE | Admit: 2014-07-03 | Discharge: 2014-07-03 | Disposition: A | Discharge status: Home / Self Care | Payer: 59 | Source: Ambulatory Visit | Attending: Family | Admitting: Family

## 2014-07-03 ENCOUNTER — Other Ambulatory Visit: Payer: Self-pay | Admitting: Family

## 2014-07-03 ENCOUNTER — Encounter: Payer: Self-pay | Admitting: Family

## 2014-07-03 DIAGNOSIS — R140 Abdominal distension (gaseous): Secondary | ICD-10-CM

## 2014-07-03 DIAGNOSIS — R58 Hemorrhage, not elsewhere classified: Secondary | ICD-10-CM

## 2014-07-04 ENCOUNTER — Ambulatory Visit (INDEPENDENT_AMBULATORY_CARE_PROVIDER_SITE_OTHER)
Admission: RE | Admit: 2014-07-04 | Discharge: 2014-07-04 | Disposition: A | Discharge status: Home / Self Care | Payer: Medicare Other | Source: Ambulatory Visit | Attending: Family | Admitting: Family

## 2014-07-04 ENCOUNTER — Ambulatory Visit (HOSPITAL_COMMUNITY)
Admission: RE | Admit: 2014-07-04 | Discharge: 2014-07-04 | Disposition: A | Discharge status: Home / Self Care | Payer: Medicare Other | Source: Ambulatory Visit | Attending: Family | Admitting: Family

## 2014-07-04 ENCOUNTER — Ambulatory Visit (INDEPENDENT_AMBULATORY_CARE_PROVIDER_SITE_OTHER): Payer: 59 | Admitting: Family

## 2014-07-04 ENCOUNTER — Encounter: Payer: Self-pay | Admitting: Family

## 2014-07-04 VITALS — BP 146/77 | HR 72 | Temp 97.2°F | Resp 16 | Ht 66.0 in | Wt 171.0 lb

## 2014-07-04 DIAGNOSIS — I739 Peripheral vascular disease, unspecified: Secondary | ICD-10-CM

## 2014-07-04 DIAGNOSIS — Z48812 Encounter for surgical aftercare following surgery on the circulatory system: Secondary | ICD-10-CM | POA: Diagnosis present

## 2014-07-04 DIAGNOSIS — I6529 Occlusion and stenosis of unspecified carotid artery: Secondary | ICD-10-CM

## 2014-07-04 NOTE — Progress Notes (Signed)
[facility] [place] HISTORY AND PHYSICAL   MRN : [id number]  History of Present Illness:   Dylan Knight is a 80 y.o. male patient of Dr. Scot Dock who has had a previous right carotid endarterectomy in 2007 and has no evidence of recurrent right carotid stenosis. The patient has also had a left internal carotid artery stent.  This patient has also undergone previous repair of an abdominal aortic aneurysm in 2003. Also has a right femoropopliteal bypass graft which has been revised in 2005.  He complains of pain in right hip only after walking about 1/2 block, relieved by rest.  He denies ulcers or slow healing wounds.  His worst complaint seems to be his low back and hip pain, he received back injections which did not help.   Patient has Negative history of TIA or stroke symptom since 2005 at which time he had right hemiparesis, he is yet unable to write very well with his right hand. The patient denies amaurosis fugax or monocular blindness. The patient denies facial drooping.  The patient denies receptive or expressive aphasia.   The patient's previous neurologic deficits are Unchanged.  Patient reports new Medical or Surgical History: cataract extraction both eyes. He fell recently and hit his right flank on his tub, this was medically evaluated.  Pt Diabetic: Yes, "borderline"  Pt smoker: quit in 2005, smoked for 63 years  Pt meds include:  Statin : Yes  Betablocker: Yes  ASA: No:  Other anticoagulants/antiplatelets: Plavix   Current Outpatient Prescriptions  Medication Sig Dispense Refill  . albuterol (ACCUNEB) 0.63 MG/3ML nebulizer solution Take 3 mLs (0.63 mg total) by nebulization 4 (four) times daily.  75 mL  6  . atenolol (TENORMIN) 25 MG tablet Take 25 mg by mouth daily.      Marland Kitchen atenolol-chlorthalidone (TENORETIC) 50-25 MG per tablet daily.      . benzonatate (TESSALON) 100 MG capsule Take 2 capsules (200 mg total) by mouth 3 (three) times daily as  needed for cough.  30 capsule  5  . clopidogrel (PLAVIX) 75 MG tablet Take 75 mg by mouth daily.      Marland Kitchen ezetimibe-simvastatin (VYTORIN) 10-40 MG per tablet Take 1 tablet by mouth at bedtime.      Marland Kitchen ipratropium (ATROVENT) 0.02 % nebulizer solution Take 2.5 mLs (500 mcg total) by nebulization 4 (four) times daily.  75 mL  6  . loratadine (CLARITIN) 10 MG tablet Take 10 mg by mouth daily.      . Multiple Vitamins-Minerals (CENTRUM SILVER PO) Take 1 tablet by mouth daily.      Marland Kitchen propylthiouracil (PTU) 50 MG tablet Take 100 mg by mouth every morning.      . sitaGLIPtan-metformin (JANUMET) 50-500 MG per tablet Take 1 tablet by mouth 2 (two) times daily with a meal.       No current facility-administered medications for this visit.    Past Medical History  Diagnosis Date  . Hypertension   . Peripheral vascular disease   . Hyperlipidemia   . BPH (benign prostatic hyperplasia)   . COPD (chronic obstructive pulmonary disease)   . Peripheral arterial disease 2002  . Stroke 2005  . Diabetes mellitus 2011    Border line    Social History History  Substance Use Topics  . Smoking status: Former Smoker -- 1.00 packs/day for 63 years    Types: Cigarettes    Quit date: 04/09/2004  . Smokeless tobacco: Never Used  . Alcohol Use:  No    Family History Family History  Problem Relation Age of Onset  . Stroke Mother   . Hypertension Mother   . Heart disease Father   . Heart attack Father   . Cancer Brother   . Hypertension Brother     Surgical History Past Surgical History  Procedure Laterality Date  . Spine surgery  1960    lumbar spine  . Spine surgery  1980, 2007    cervical spine  . Abdominal aortic aneurysm repair  2003    Aorto-left femoral-right iliac BPG by Dr. Scot Dock  . Toe amputation  08-2001    right 5th toe amputation  . Carotid endarterectomy  Jan. 25,2007    Right CEA  . Pr vein bypass graft,aorto-fem-pop  2005    Revision Right Fem-pop  . Pr vein bypass  graft,aorto-fem-pop  2002    Right Fem-pop    Allergies  Allergen Reactions  . Iohexol Hives    Went away with benadryl     Current Outpatient Prescriptions  Medication Sig Dispense Refill  . albuterol (ACCUNEB) 0.63 MG/3ML nebulizer solution Take 3 mLs (0.63 mg total) by nebulization 4 (four) times daily.  75 mL  6  . atenolol (TENORMIN) 25 MG tablet Take 25 mg by mouth daily.      Marland Kitchen atenolol-chlorthalidone (TENORETIC) 50-25 MG per tablet daily.      . benzonatate (TESSALON) 100 MG capsule Take 2 capsules (200 mg total) by mouth 3 (three) times daily as needed for cough.  30 capsule  5  . clopidogrel (PLAVIX) 75 MG tablet Take 75 mg by mouth daily.      Marland Kitchen ezetimibe-simvastatin (VYTORIN) 10-40 MG per tablet Take 1 tablet by mouth at bedtime.      Marland Kitchen ipratropium (ATROVENT) 0.02 % nebulizer solution Take 2.5 mLs (500 mcg total) by nebulization 4 (four) times daily.  75 mL  6  . loratadine (CLARITIN) 10 MG tablet Take 10 mg by mouth daily.      . Multiple Vitamins-Minerals (CENTRUM SILVER PO) Take 1 tablet by mouth daily.      Marland Kitchen propylthiouracil (PTU) 50 MG tablet Take 100 mg by mouth every morning.      . sitaGLIPtan-metformin (JANUMET) 50-500 MG per tablet Take 1 tablet by mouth 2 (two) times daily with a meal.       No current facility-administered medications for this visit.     REVIEW OF SYSTEMS: See HPI for pertinent positives and negatives.  Physical Examination Filed Vitals:   07/04/14 1454 07/04/14 1456  BP: 127/76 146/77  Pulse: 76 72  Temp: 97.2 F (36.2 C)   TempSrc: Oral   Resp: 16   Height: 5\' 6"  (1.676 m)   Weight: 171 lb (77.565 kg)   SpO2: 98%    Body mass index is 27.61 kg/(m^2).  General: WDWN male in NAD  GAIT: antalgic  Eyes: PERRLA  Pulmonary: CTAB, Negative Rales, Negative rhonchi, & Negative wheezing,  Cardiac: regular Rhythm , Negative Murmurs,  Vascular:  Vessel  Right  Left   Radial  2+Palpable  1+Palpable   Carotid  Audible, without bruit   Audible, without bruit   Aorta  Not palpable  N/A   Femoral  Not Palpable  1+Palpable   Popliteal  Not palpable  Not palpable   PT  Not Palpable  Not Palpable   DP  Faintly Palpable  Not Palpable    Gastrointestinal: soft, nontender, BS WNL, no r/g, negative masses;  Musculoskeletal: Negative muscle  atrophy/wasting. M/S 5/5 throughout, Extremities without ischemic changes. Right 5th toe s/p amputation.  Neurologic: A&O X 3; Appropriate Affect ; SENSATION ;normal; MOTOR FUNCTION: normal 5/5 strength in all tested muscle groups  Speech is normal  CN 2-12 intact , Pain and light touch intact in extremities , Motor exam as listed above   Significant Diagnostic Studies:   Non-Invasive Vascular Imaging (07/04/2014):  CEREBROVASCULAR DUPLEX EVALUATION     INDICATION: Carotid artery disease; evaluation status post stent placement.    PREVIOUS INTERVENTION(S): Right carotid endarterectomy 12/03/2005 Left internal carotid artery stent placed in 2003    DUPLEX EXAM:     RIGHT  LEFT  Peak Systolic Velocities (cm/s) End Diastolic Velocities (cm/s) Plaque LOCATION Peak Systolic Velocities (cm/s) End Diastolic Velocities (cm/s) Plaque  80 11  CCA PROXIMAL 99 17   77 15  CCA MID 76 18   72 13  CCA DISTAL 73 14   66 8  ECA 117 11   52 9  ICA PROXIMAL     67 22  ICA MID     75 21  ICA DISTAL 87 25     NA ICA / CCA Ratio (PSV) NA  Antegrade Vertebral Flow Abnormal antegrade   528 Brachial Systolic Pressure (mmHg) 413  Within normal limits Brachial Artery Waveforms Within normal limits     Peak Systolic Velocities (cm/s) End Diastolic Velocities (cm/s) Plaque STENT (Left ) Peak Systolic Velocities (cm/s) End Diastolic Velocities (cm/s) Plaque     PROXIMAL 80 15      MID 120 36 HM     DISTAL 127 39     Plaque Morphology:  HM = Homogeneous, HT = Heterogeneous, CP = Calcific Plaque, SP = Smooth Plaque, IP = Irregular Plaque    ADDITIONAL FINDINGS:     IMPRESSION: 1. Widely patent right  carotid endarterectomy without evidence of restenosis or hyperplasia.  2. Patent left internal carotid artery stent with mild hyperplasia (<40%) and slightly elevated velocities in the mid stent. 3. Bilateral vertebral artery is antegrade; the left is high resistant.    Compared to the previous exam:  No significant change compared to prior exam.    LOWER EXTREMITY ARTERIAL DUPLEX EVALUATION    INDICATION: Follow-up right lower extremity bypass graft     PREVIOUS INTERVENTION(S): Right femoropopliteal arterial bypass graft placed 08/25/2001 with revision on 11/15/2003. Aortobifemoral arterial bypass graft placed in 2003    DUPLEX EXAM:     RIGHT  LEFT   Peak Systolic Velocity (cm/s) Ratio (if abnormal) Waveform  Peak Systolic Velocity (cm/s) Ratio (if abnormal) Waveform  170  T Inflow Artery     80  T Proximal Anastomosis     88  T Proximal Graft     92  T Mid Graft     93  T  Distal Graft     99  T Distal Anastomosis     55  T Outflow Artery     1.01 Today's ABI / TBI 0.79  1.15 Previous ABI / TBI ( 06/28/2013 ) 0.74    Waveform:    M - Monophasic       B - Biphasic       T - Triphasic  If Ankle Brachial Index (ABI) or Toe Brachial Index (TBI) performed, please see complete report     ADDITIONAL FINDINGS:     IMPRESSION: Widely patent right femoropopliteal arterial bypass graft without evidence of restenosis or hyperplasia.     Compared to the  previous exam:  No significant change compared to prior exam.      ASSESSMENT:  Dylan Knight is a 80 y.o. male who is s/p  right carotid endarterectomy in 2007 and has no evidence of recurrent right carotid stenosis. The patient has also had a left internal carotid artery stent.  This patient has also undergone previous repair of an abdominal aortic aneurysm in 2003. Also has a right femoropopliteal bypass graft which has been revised in 2005.  He has had no stroke or TIA activity since 2005. Carotid Duplex today demonstrates  Widely patent right carotid endarterectomy without evidence of restenosis or hyperplasia, patent left internal carotid artery stent with mild hyperplasia (<40%) and slightly elevated velocities in the mid stent. Bilateral vertebral artery is antegrade; the left is high resistant. No significant change compared to prior exam.   He has no claudication complaints, no tissue loss. Today's right lower extremity arterial Duplex demonstrates a widely patent right femoropopliteal arterial bypass graft without evidence of restenosis or hyperplasia.  No significant change compared to prior exam.  ABI is normal in the right leg, moderate arterial occlusive disease in the left leg.   PLAN:   Based on today's exam and non-invasive vascular lab results, the patient will follow up in 1 year with the following tests carotid Duplex, right LE arterial Duplex, ABI's. Since he has balance issues, seated leg and arm exercises daily as discussed and demonstrated.  I discussed in depth with the patient the nature of atherosclerosis, and emphasized the importance of maximal medical management including strict control of blood pressure, blood glucose, and lipid levels, obtaining regular exercise, and cessation of smoking.  The patient is aware that without maximal medical management the underlying atherosclerotic disease process will progress, limiting the benefit of any interventions.  The patient was given information about stroke prevention and what symptoms should prompt the patient to seek immediate medical care.  The patient was given information about PAD including signs, symptoms, treatment, what symptoms should prompt the patient to seek immediate medical care, and risk reduction measures to take.  Thank you for allowing [place] to participate in this patient's care.  Clemon Chambers, RN, MSN, FNP-C [facility] Office: [phone]  Clinic MD: Scot Dock 07/04/2014 2:56 PM

## 2014-07-04 NOTE — Patient Instructions (Signed)

## 2014-07-05 NOTE — Addendum Note (Signed)
Addended by: Mena Goes on: 07/05/2014 05:21 PM   Modules accepted: Orders

## 2014-07-17 ENCOUNTER — Telehealth: Payer: Self-pay | Admitting: Emergency Medicine

## 2014-07-17 NOTE — Telephone Encounter (Signed)
Immunization chart has been updated. Nothing further needed

## 2014-07-20 ENCOUNTER — Encounter (INDEPENDENT_AMBULATORY_CARE_PROVIDER_SITE_OTHER): Payer: 59 | Admitting: Ophthalmology

## 2014-07-20 DIAGNOSIS — E11319 Type 2 diabetes mellitus with unspecified diabetic retinopathy without macular edema: Secondary | ICD-10-CM

## 2014-07-20 DIAGNOSIS — H3530 Unspecified macular degeneration: Secondary | ICD-10-CM

## 2014-07-20 DIAGNOSIS — E1139 Type 2 diabetes mellitus with other diabetic ophthalmic complication: Secondary | ICD-10-CM

## 2014-07-20 DIAGNOSIS — I10 Essential (primary) hypertension: Secondary | ICD-10-CM

## 2014-07-20 DIAGNOSIS — E1165 Type 2 diabetes mellitus with hyperglycemia: Secondary | ICD-10-CM

## 2014-07-20 DIAGNOSIS — H35039 Hypertensive retinopathy, unspecified eye: Secondary | ICD-10-CM

## 2014-07-20 DIAGNOSIS — H43819 Vitreous degeneration, unspecified eye: Secondary | ICD-10-CM

## 2014-08-03 ENCOUNTER — Other Ambulatory Visit: Payer: Self-pay | Admitting: Emergency Medicine

## 2014-09-17 ENCOUNTER — Ambulatory Visit
Admission: RE | Admit: 2014-09-17 | Discharge: 2014-09-17 | Disposition: A | Discharge status: Home / Self Care | Payer: 59 | Source: Ambulatory Visit | Attending: Nurse Practitioner | Admitting: Nurse Practitioner

## 2014-09-17 ENCOUNTER — Other Ambulatory Visit: Payer: Self-pay | Admitting: Nurse Practitioner

## 2014-09-17 DIAGNOSIS — R52 Pain, unspecified: Secondary | ICD-10-CM

## 2014-09-17 DIAGNOSIS — W19XXXD Unspecified fall, subsequent encounter: Secondary | ICD-10-CM

## 2014-09-26 ENCOUNTER — Ambulatory Visit (INDEPENDENT_AMBULATORY_CARE_PROVIDER_SITE_OTHER): Payer: Medicare Other | Admitting: Emergency Medicine

## 2014-09-26 ENCOUNTER — Encounter: Payer: Self-pay | Admitting: Emergency Medicine

## 2014-09-26 VITALS — BP 122/60 | HR 94 | Ht 66.0 in | Wt 172.8 lb

## 2014-09-26 DIAGNOSIS — R05 Cough: Secondary | ICD-10-CM

## 2014-09-26 DIAGNOSIS — J449 Chronic obstructive pulmonary disease, unspecified: Secondary | ICD-10-CM

## 2014-09-26 DIAGNOSIS — R059 Cough, unspecified: Secondary | ICD-10-CM

## 2014-09-26 MED ORDER — BENZONATATE 100 MG PO CAPS: 200.0000 mg | ORAL_CAPSULE | Freq: Three times a day (TID) | ORAL | Status: DC | PRN

## 2014-09-26 MED ORDER — AZITHROMYCIN 250 MG PO TABS: 250.0000 mg | ORAL_TABLET | Freq: Every day | ORAL | Status: DC

## 2014-09-26 NOTE — Assessment & Plan Note (Signed)
Tessalon Perles refilled

## 2014-09-26 NOTE — Assessment & Plan Note (Signed)
He has an upper respiratory infection and probably an early bronchitis. Do not hear any wheezing or evidence of bronchospasm. I would like to treat him with an antibiotic but we'll defer prednisone.  - Azithromycin - Continue DuoNeb twice a day

## 2014-09-26 NOTE — Progress Notes (Signed)
Subjective:    Patient ID: Dylan Knight, male    DOB: 11/24/1923, 80 y.o.   MRN: [id number]  HPI 80 yo former smoker, 60+ pk-yrs, hx DM, PVD (fem bypass), AAA with stenting, HTN, CVA '05. Was dx with COPD in 12/13 when he was admitted to [facility] for CAP and a flare. He was discharged on Advair and combivent, but he didn't stay on these - poor coordination taking HFA's. He is typically able to exert himself without stopping. Has a daily frequent cough and has had this for over 20 yrs. Productive of white mucous. He doesn't hear wheezing. He has had many episodes of PNA vs AE, averages about once a year. Has been admitted 3 times in the past. Not on any inhaled medications right now, feels at baseline.   ROV 12/13/12 -- follow up for COPD and cough, frequent exacerbations. Had PFT today >> moderately severe AFL, no BD response, normal volumes, decreased DLCO that corrects for Va.  Feels that he has been doing well since last time. Coughs occasionally during the day. The cough is better at night on the tessalon. No clear heartburn sx. Not currently on BD's - difficulty doing them due to coordination. He is taking mucinex-D. No real nasal or head congestion.   ROV 01/27/13 -- COPD with mod severe AFL. He had a URI 2 weeks ago, caught from her wife. He is still having green phlegm. We started Duonebs last time > he didn't like them, didn't fell a benefit. He did them for 5-6 days and then stopped. He remains on mucinex. Benefited from [place].   ROV 04/18/13 -- COPD with mod severe AFL.  He is using DuoNebs bid, still coughs some phlegm but much better than last time.   ROV 11/15/13 -- COPD, moderately severe AFL.  He was unfortunately admitted to [facility] for AE-COPD and PNA, treated with abx, steroids. Happens about once a year, usually in the Fall. He is using duonebs less > about qd instead of bid (he knows he could be using 4x a day). He is active. Able to exert. Occasional cough, minimal cough.    ROV 03/10/14 -- COPD, moderately severe AFL.  They have sold their house and have downsized. No more hospitalizations, no flares since last visit.  He is using duonebs about 2x a week, our plans had been to use qid. Not sure he misses it. Minimal cough or wheeze. Taking loratadine reliably.   ROV 09/26/14 -- follow up visit for COPD. He has been managed on albuterol / atrovent BID. He is uncertain that the medication helps him. He unfortunately fell in July and hurt R leg.  He remains able to get around, he no longer drives. He has recently noticed throat hoarseness. Has clear / yellow nasal drainage. No fever but he has had thick yellow sputum.      Objective:   Physical Exam  Filed Vitals:   09/26/14 1412  BP: 122/60  Pulse: 94  Height: 5\' 6"  (1.676 m)  Weight: 172 lb 12.8 oz (78.382 kg)  SpO2: 92%   Gen: Pleasant, elderly man, in no distress,  normal affect  ENT: No lesions,  mouth clear,  oropharynx clear, no postnasal drip  Neck: No JVD, no TMG, no carotid bruits  Lungs: No use of accessory muscles, distant, clear without rales or rhonchi, coarse on a forced exp  Cardiovascular: RRR, heart sounds normal, no murmur or gallops, no peripheral edema  Musculoskeletal: No deformities, no cyanosis or  clubbing  Neuro: alert, non focal  Skin: Warm, no lesions or rash     Assessment & Plan:  Cough Tessalon Perles refilled  COPD (chronic obstructive pulmonary disease) He has an upper respiratory infection and probably an early bronchitis. Do not hear any wheezing or evidence of bronchospasm. I would like to treat him with an antibiotic but we'll defer prednisone.  - Azithromycin - Continue DuoNeb twice a day

## 2014-09-26 NOTE — Patient Instructions (Signed)
Please continue your albuterol plus ipratropium nebulizers twice a day Take azithromycin as directed until completely gone We will refill your Tessalon Perles Follow with Dr Lamonte Sakai in 6 months or sooner if you have any problems

## 2014-11-24 ENCOUNTER — Observation Stay: Payer: Self-pay | Admitting: Specialist

## 2014-11-24 LAB — URINALYSIS, COMPLETE
Bacteria: NONE SEEN
Bilirubin,UR: NEGATIVE
Blood: NEGATIVE
Glucose,UR: NEGATIVE mg/dL (ref 0–75)
Ketone: NEGATIVE
Leukocyte Esterase: NEGATIVE
Nitrite: NEGATIVE
Ph: 7 (ref 4.5–8.0)
Protein: NEGATIVE
RBC,UR: <1 /HPF (ref 0–5)
Specific Gravity: 1.008 (ref 1.003–1.030)
Squamous Epithelial: NONE SEEN
WBC UR: <1 /HPF (ref 0–5)

## 2014-11-24 LAB — COMPREHENSIVE METABOLIC PANEL
Albumin: 3.9 g/dL (ref 3.4–5.0)
Alkaline Phosphatase: 51 U/L
Anion Gap: 7 (ref 7–16)
BUN: 9 mg/dL (ref 7–18)
Bilirubin,Total: 0.9 mg/dL (ref 0.2–1.0)
Calcium, Total: 9.1 mg/dL (ref 8.5–10.1)
Chloride: 96 mmol/L — ABNORMAL LOW (ref 98–107)
Co2: 31 mmol/L (ref 21–32)
Creatinine: 1.35 mg/dL — ABNORMAL HIGH (ref 0.60–1.30)
EGFR (African American): >60
EGFR (Non-African Amer.): 53 — ABNORMAL LOW
Glucose: 135 mg/dL — ABNORMAL HIGH (ref 65–99)
Osmolality: 269 (ref 275–301)
Potassium: 3.6 mmol/L (ref 3.5–5.1)
SGOT(AST): 20 U/L (ref 15–37)
SGPT (ALT): 25 U/L
Sodium: 134 mmol/L — ABNORMAL LOW (ref 136–145)
Total Protein: 6.9 g/dL (ref 6.4–8.2)

## 2014-11-24 LAB — APTT: Activated PTT: 38.4 secs — ABNORMAL HIGH (ref 23.6–35.9)

## 2014-11-24 LAB — CBC
HCT: 47.9 % (ref 40.0–52.0)
HGB: 16.4 g/dL (ref 13.0–18.0)
MCH: 33.5 pg (ref 26.0–34.0)
MCHC: 34.3 g/dL (ref 32.0–36.0)
MCV: 98 fL (ref 80–100)
Platelet: 213 10*3/uL (ref 150–440)
RBC: 4.89 10*6/uL (ref 4.40–5.90)
RDW: 13.9 % (ref 11.5–14.5)
WBC: 7.2 10*3/uL (ref 3.8–10.6)

## 2014-11-24 LAB — PROTIME-INR
INR: 1
Prothrombin Time: 12.6 secs (ref 11.5–14.7)

## 2014-11-24 LAB — TROPONIN I: Troponin-I: <0.02 ng/mL

## 2014-11-25 DIAGNOSIS — I340 Nonrheumatic mitral (valve) insufficiency: Secondary | ICD-10-CM

## 2014-11-25 LAB — BASIC METABOLIC PANEL
Anion Gap: 8 (ref 7–16)
BUN: 9 mg/dL (ref 7–18)
Calcium, Total: 9.5 mg/dL (ref 8.5–10.1)
Chloride: 100 mmol/L (ref 98–107)
Co2: 29 mmol/L (ref 21–32)
Creatinine: 1.22 mg/dL (ref 0.60–1.30)
EGFR (African American): >60
EGFR (Non-African Amer.): 60 — ABNORMAL LOW
Glucose: 144 mg/dL — ABNORMAL HIGH (ref 65–99)
Osmolality: 275 (ref 275–301)
Potassium: 3.7 mmol/L (ref 3.5–5.1)
Sodium: 137 mmol/L (ref 136–145)

## 2014-11-25 LAB — LIPID PANEL
Cholesterol: 145 mg/dL (ref 0–200)
HDL Cholesterol: 42 mg/dL (ref 40–60)
Ldl Cholesterol, Calc: 28 mg/dL (ref 0–100)
Triglycerides: 377 mg/dL — ABNORMAL HIGH (ref 0–200)
VLDL Cholesterol, Calc: 75 mg/dL — ABNORMAL HIGH (ref 5–40)

## 2014-11-25 LAB — CBC WITH DIFFERENTIAL/PLATELET
Basophil #: 0 10*3/uL (ref 0.0–0.1)
Basophil %: 0.5 %
Eosinophil #: 0.3 10*3/uL (ref 0.0–0.7)
Eosinophil %: 3.7 %
HCT: 50.6 % (ref 40.0–52.0)
HGB: 17.2 g/dL (ref 13.0–18.0)
Lymphocyte #: 1.3 10*3/uL (ref 1.0–3.6)
Lymphocyte %: 17 %
MCH: 33.5 pg (ref 26.0–34.0)
MCHC: 34 g/dL (ref 32.0–36.0)
MCV: 99 fL (ref 80–100)
Monocyte #: 0.9 x10 3/mm (ref 0.2–1.0)
Monocyte %: 11.1 %
Neutrophil #: 5.3 10*3/uL (ref 1.4–6.5)
Neutrophil %: 67.7 %
Platelet: 218 10*3/uL (ref 150–440)
RBC: 5.13 10*6/uL (ref 4.40–5.90)
RDW: 13.5 % (ref 11.5–14.5)
WBC: 7.8 10*3/uL (ref 3.8–10.6)

## 2014-11-25 LAB — TSH: Thyroid Stimulating Horm: 10.7 u[IU]/mL — ABNORMAL HIGH

## 2015-01-18 ENCOUNTER — Encounter (INDEPENDENT_AMBULATORY_CARE_PROVIDER_SITE_OTHER): Payer: Medicare Other | Admitting: Ophthalmology

## 2015-01-18 DIAGNOSIS — I10 Essential (primary) hypertension: Secondary | ICD-10-CM

## 2015-01-18 DIAGNOSIS — H3531 Nonexudative age-related macular degeneration: Secondary | ICD-10-CM

## 2015-01-18 DIAGNOSIS — E11329 Type 2 diabetes mellitus with mild nonproliferative diabetic retinopathy without macular edema: Secondary | ICD-10-CM

## 2015-01-18 DIAGNOSIS — E11311 Type 2 diabetes mellitus with unspecified diabetic retinopathy with macular edema: Secondary | ICD-10-CM

## 2015-01-18 DIAGNOSIS — H35033 Hypertensive retinopathy, bilateral: Secondary | ICD-10-CM

## 2015-01-18 DIAGNOSIS — H43813 Vitreous degeneration, bilateral: Secondary | ICD-10-CM | POA: Diagnosis not present

## 2015-01-23 ENCOUNTER — Encounter: Payer: Self-pay | Admitting: Physical Medicine & Rehabilitation

## 2015-02-26 ENCOUNTER — Ambulatory Visit: Payer: Medicare Other | Admitting: Physical Medicine & Rehabilitation

## 2015-02-26 NOTE — H&P (Signed)
PATIENT NAME:  Dylan Knight, MCGILVERY MR#:  [id number] DATE OF BIRTH:  1923-12-18  DATE OF ADMISSION:  10/12/2012  PRIMARY CARE PHYSICIAN: Dr. Baird Cancer  REFERRING PHYSICIAN: Dr. Ponciano Ort   CHIEF COMPLAINT: Shortness of breath and fever and chills.   HISTORY OF PRESENT ILLNESS: Mr. Avakian is a very nice 81 year old gentleman who has significant history of previously smoking 2 packs a day for over 40 years. He had pneumonia twice within the last three years, a cerebrovascular accident, skin cancer, peripheral vascular disease, carotid arterial sclerosis, hypertension, dyslipidemia and diabetes. The patient has been doing okay although within the past week he has developed a cough that is persistent. He has sputum which is white color but within the past 48 hours has become really thick and starting to change to yellow. He has been complaining of some dysphonia or laryngitis for the past week but no sore throat. Around 4:00 p.m. today he started having increased shortness of breath after he was ambulating in his house not doing any major physical activity and has significant wheezing and increased secretions with chills and felt really warm to the touch. Because of this the patient decided to come to the ER where he was found to be tachycardic and tachypneic as well as having a temperature of 99.1 and an oxygen saturation of 80% on room air. The patient was in significant respiratory distress using accessory muscles but is starting to improve after he obtained some steroids and nebulizer treatments. The patient is starting to feel a little bit better but still has significant shortness of breath when he moves a little bit around.   REVIEW OF SYSTEMS: CONSTITUTIONAL: Positive fever. Positive fatigue. Positive difficulty ambulating. No weight loss, weight gain. EYES: Denies any blurry vision or changes in his eyesight. ENT: No tinnitus. No sore throat but positive laryngitis with dysphonia. Negative epistaxis.  No nasal discharge. RESPIRATORY: Positive cough. The patient states that he has never been diagnosed with chronic obstructive pulmonary disease but every winter he has a cough that persists for about over a month and occasionally has bronchitis and he has some shortness of breath when he ambulates. He wheezes occasionally. He had some severe wheezing today, severe respiratory distress and dyspnea that has improved. He has had pneumonia twice within the past three years. CARDIOVASCULAR: No chest pain, orthopnea, edema, or palpitations. No syncopal episodes. GASTROINTESTINAL: No nausea, vomiting, or diarrhea. No hematemesis or melena. No rectal bleeding. GENITOURINARY: No dysuria or hematuria. ENDOCRINOLOGY: Patient has diabetes but no polyuria, polydipsia. Patient had a history of hyperthyroidism for what he takes propylthiouracil. HEMATOLOGIC/LYMPHATIC: No easy bruising, anemia or swollen glands. SKIN: No acne, rashes or lesions. He did have history of basal cell carcinoma that has been removed. MUSCULOSKELETAL: No significant gout or swollen joints. NEUROLOGIC: No numbness, weakness, transient ischemic attacks at the moment. He had a CVA eight years ago. PSYCHIATRIC: No insomnia. No anxiety or depression.   PAST MEDICAL HISTORY:  1. History of cerebrovascular accident.  2. Pneumonia x2.  3. History of skin cancer.  4. Peripheral vascular disease.  5. Carotid artery sclerosis disease.  6. Hypertension.  7. Dyslipidemia.  8. Diabetes.  9. History of AAA.  49. Former smoker.  11. history of hyperthyroidism diagnosed eight years ago at [facility].   ALLERGIES: Patient is allergic to IV dye.   PAST SURGICAL HISTORY:  1. Bypass of the right lower extremity in 2002. 2. AAA repair in 2003. 3. Laminectomy of the back 1960. 4. Laminectomy of  the neck 1980. 5. Carotid artery stent of the left and a carotid endarterectomy of the right.   FAMILY HISTORY: Stroke in his mom, asbestosis on his brother and cancer  in his brother but he cannot tell me what type.   SOCIAL HISTORY: Patient is a former smoker. He smoked 2 packs a day for over 40 years. He quit eight years ago. Alcohol: Denies any current alcohol use. No IV drug abuse. 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   MEDICATIONS:  1. Vytorin 10/40 once a day. 2. Propylthiouracil 50 mg once a day.  3. Plavix 75 mg once a day.  4. Janumet 50/500, 1 p.o. twice daily.  5. Centrum Silver once a day. 6. Atenolol chlorthalidone 100/25, 1 tablet once a day.   PHYSICAL EXAMINATION:  VITAL SIGNS: Blood pressure 133/54, pulse in between 105 to 112, temperature 98.1, respirations 18, oxygen sats 80% on room air, currently 94% on oxygen 2 to 4 liters.   GENERAL: Patient is alert, oriented x3, not in acute distress at this moment. No respiratory distress at this moment. Hemodynamically stable.   HEENT: Pupils are equal and reactive. Extraocular movements are intact. Mucosa are slightly dry. Conjunctivae are anicteric. His sclerae are anicteric. No oral lesions. No oropharyngeal exudates.   NECK: Supple. No JVD. No thyromegaly. No adenopathy. No carotid bruits. Scar from right endarterectomy has healed. Normal range of motion.   CARDIOVASCULAR: Regular rate and rhythm, slightly tachycardic. No murmurs, rubs, or gallops. No displacement of PMI. No tenderness to palpation of the chest wall.   LUNGS: Mostly clear on upper lobes and middle lobes with no wheezing or rhonchi at this moment. He does have significant rales on his right lower lobe. There is no dullness to percussion at this moment. There is no use of accessory muscles at this moment although patient was using it prior to his nebs.    ABDOMEN: Soft, nontender, nondistended. No hepatosplenomegaly. No masses. Bowel sounds are positive.   GENITAL: No external lesions.   EXTREMITIES: No edema, no cyanosis, no clubbing. Pulses +2. Sensation is normal. Capillary refill less than 3  seconds.   LYMPHATICS: Negative for lymphadenopathy in neck or supraclavicular area.   NEUROLOGIC: Cranial nerves II through XII intact.   PSYCH: Mood is normal without any signs of depression or agitation.   SKIN: Without any significant lesions or rashes.   MUSCULOSKELETAL: No significant joint abnormalities.    LABORATORY, DIAGNOSTIC AND RADIOLOGICAL DATA: Glucose 153, creatinine 1.19, sodium 132, potassium 3.7, calcium 9.9, albumin 4.2. LFTs within normal limits. Troponins are negative. White blood cells 16.3, hemoglobin 16.8, platelets 204. Chest x-ray did not show any significant consolidations. EKG tachycardic, normal sinus rhythm.   ASSESSMENT AND PLAN:  1. Acute respiratory failure. Patient came with significant acute respiratory distress, significant wheezing and desaturation of oxygen at room air at 80%. It has improved after aggressive respiratory therapy and pulmonary toilet and also after patient having steroids. The patient is starting to feel a little bit better and he is saturating still on the low to mid 90s, in between 2 to 4 liters of oxygen has been weaned down.  2. SIRS. Patient has criteria for SIRS evidenced by tachycardia, tachypnea and increased white blood cells. Her temperature is slightly increased but not over 100 degrees. Likely this is due to pneumonia versus acute bronchitis.  3. Chronic obstructive pulmonary disease exacerbation versus pneumonia or acute bronchitis. The patient has mild fever, he is  very symptomatic, has respiratory distress, respiratory failure and SIRS for what is highly suspicious for pneumonia. At this moment his chest x-ray did not show an infiltrate although the patient has had pneumonia twice within the past few years and he has clinical findings of pneumonia with significant rales only located on his right lower lung base after treatment. He might be dehydrated for what the infiltrate is not showing completely. We are going to go ahead and  repeat the chest x-ray in the morning. If there is doubt he could have CT scan if necessary. We are going to cover with Levaquin that will cover respiratory flora including atypicals. Continue steroids since the patient was actively wheezing. Follow up on white blood count, which could be higher due to the steroids.  4. Hyponatremia, likely due to intravascular volume depletion. IV fluids ordered.  5. Hyperglycemia. Patient has history of borderline diabetes. We are going to check his hemoglobin A1c.  6. Tachycardia. Patient also has history of hyperthyroidism which was diagnosed eight years ago. He has never seen an endocrinologist and there has not been major follow up on this other than occasional TSH checks. We are going to check his TSH, T4, T3 again. If he indeed has Graves' disease or hyperthyroidism he could benefit from getting radioactive iodine to his thyroid.  7. Hyperthyroidism as mentioned above.  8. Diabetes. Continue treatment with Janumet. Check hemoglobin A1c.   9. Hypertension. Continue treatment with current medications including atenolol with chlorthalidone.  10. Peripheral vascular disease. Continue treatment with Plavix.  11. Dyslipidemia. Continue Vytorin.  12. Patient is a DO NOT RESUSCITATE.   TIME SPENT: I spent about 48 minutes with this admission today.   ____________________________ Pulaski Sink, MD rsg:cms D: 10/12/2012 22:04:33 ET T: 10/13/2012 06:42:33 ET JOB#: [id number]  cc: Vernal Sink, MD, <Dictator> Dr. York Spaniel America Brown MD ELECTRONICALLY SIGNED 10/14/2012 13:46

## 2015-02-26 NOTE — Discharge Summary (Signed)
PATIENT NAME:  Dylan Knight, Dylan Knight MR#:  [id number] DATE OF BIRTH:  November 28, 1923  DATE OF ADMISSION:  10/12/2012 DATE OF DISCHARGE:  10/15/2012  CHIEF COMPLAINT: Shortness of breath.   DISCHARGE DIAGNOSES:  1. Acute respiratory failure in the setting of possible chronic obstructive pulmonary disease acute exacerbation and also acute bronchitis.  2. Hyponatremia.  3. Tachycardia.  4. History of peripheral vascular disease.  5. History of skin cancer.  6. History of pneumonia. 7. History of stroke.  8. Hypertension.  9. Dyslipidemia.  10. Diabetes.  11. History of abdominal aortic aneurysm. 87. Former smoker.  13. History of hyperthyroidism.   DISCHARGE MEDICATIONS:  1. Plavix 75 mg daily.  2. Centrum Silver 1 tab daily.  3. Janumet 50/500, 1 tab 2 times a day.  4. Propylthiouracil 50 mg, 1 tab daily in the morning. 5. Vytorin 1 tab daily, 10/40 mg tablet at bedtime.  6. Loratadine 10 mg daily.  7. PreserVision 1 tab 2 times a day.  8. Atenolol 100 mg daily.  9. Albuterol/ipratropium CFC-free 100 mcg/20 mcg inhaled, 1 puff  4 times daily.  10. Advair 250/50 mcg, 1 puff twice a day.  11. Chlorthalidone 25 mg daily.  12. Prednisone taper 50 mg for a day and taper down by 10 mg until done in five days.  13. Levaquin 500 mg daily for one day.   DIET: Low sodium, low fat, low cholesterol, consistent carb diet.   ACTIVITY: As tolerated.   FOLLOWUP: Please follow with  primary care physician and get a referral to see a pulmonologist within 1 to 2 weeks with for full pulmonary function tests.   DISPOSITION: Home.   HISTORY OF PRESENT ILLNESS: For full details of the History and Physical, please see the dictation on 10/12/2012 by Dr. Laurin Coder. Briefly, this is a pleasant 81 year old gentleman with previous smoking for multiple decades, quit now, who presented with acute respiratory failure, low-grade temperatures, and hypoxemia with oxygen saturations of about 80% and was admitted to the  hospitalist service.   SIGNIFICANT LABS AND IMAGING: Initial sodium 132, BUN 18, creatinine 1.19. LFTs on arrival within normal limits.  Troponin negative. TSH and free thyroxine within normal limits. Initial WBC 16.3, last WBC of 19.5, peak WBC of 22.7. Blood cultures on arrival: No growth to date. T3 by RIA within normal range. X-rays of the chest, PA and lateral, on 12/04 showing no acute disease of the chest and repeated on 12/05 showing mild basilar opacities that are likely secondary to subsegmental atelectasis.   HOSPITAL COURSE: The patient was admitted to the hospitalist service and started on Levaquin for initially presumed pneumonia and acute respiratory failure. The patient does not have any diagnosis of chronic obstructive pulmonary disease and does not follow with a pulmonologist. However, he has multiple-decade history of tobacco abuse. However, he has quit. He did have acute respiratory failure with significant hypoxemia on arrival and was started on high-dose steroids and nebulizers and also Advair. He has done well overall.  His shortness of breath and wheezing have improved dramatically. He will be discharged with Advair and ipratropium with a prednisone taper and one more day of Levaquin. He does not appear to have pneumonia based on x-ray findings and will be on Levaquin for five days. I discussed the need to follow with his PCP and get a referral to see a pulmonologist for a diagnosis of chronic obstructive pulmonary disease and further evaluation for his shortness of breath. He did have SIRS per criteria,  likely secondary to acute bronchitis. He does have elevated WBC, likely from steroids, but has symptomatically improved. For his diabetes he was on metformin and Januvia and they were continued. Currently he has ambulated in the hall without significant shortness of breath and is eager to go home. He has no significant wheezing and has been weaned off of oxygen.     TOTAL TIME SPENT: 35  minutes.    ____________________________ Vivien Presto, MD sa:bjt D: 10/15/2012 13:55:26 ET T: 10/16/2012 11:19:13 ET JOB#: [id number]  cc: Vivien Presto, MD, <Dictator> Vivien Presto MD ELECTRONICALLY SIGNED 10/20/2012 11:12

## 2015-03-01 NOTE — Discharge Summary (Signed)
PATIENT NAME:  Dylan Knight, Dylan Knight MR#:  [id number] DATE OF BIRTH:  September 18, 1924  DATE OF ADMISSION:  10/09/2013 DATE OF DISCHARGE:  10/10/2013  DISCHARGE DIAGNOSES: 1.  Systemic inflammatory response syndrome Ruled out  2.  Pneumonia with underlying chronic obstructive pulmonary disease, now improving.  3.  Dehydration/hyponatremia, resolved with hydration.  4.  Generalized weakness secondary to hyponatremia and acute illness, now improved.  5.  Hypokalemia, repleted and resolved.  6  Hypothyroidism, will require outpatient endocrine followup, likely euthyroid sick syndrome at this time.   SECONDARY DIAGNOSES: 1.  Peripheral vascular disease.  2.  Diabetes mellitus.  3.  Hyperlipidemia.  4.  Hypothyroidism.  5.  Chronic obstructive pulmonary disease.  6.  Macular degeneration.  7.  History of stroke.   CONSULTATIONS: None.   PROCEDURES AND RADIOLOGY:  1.  Chest x-ray on 2nd of December showed no acute cardiopulmonary disease.  2.  Chest x-ray on the 29th of November showed possible mild right upper lobe/perihilar opacity, possible pneumonia.  3.  Major laboratory panel: UA on admission was negative.  4.  Sputum culture was negative for any bacteria, did have some rare gram-positive cocci in pairs but otherwise good specimen and normal flora.  5.  Blood cultures x 2 were negative on the 29th of November.   HISTORY AND SHORT HOSPITAL COURSE: The patient is an 81 year old male with above-mentioned medical problems who was admitted for pneumonia with underlying COPD.  Please see Dr. Helene Kelp Tullo's dictated history and physical for further details. The patient was improving with IV antibiotic. He also had some hyponatremia and hypokalemia which was repleted and resolved. He was feeling significantly better and was close to baseline on the 2nd of December was discharged home in stable condition.   PERTINENT PHYSICAL EXAMINATION ON THE DATE OF DISCHARGE:   ON THE DATE OF DISCHARGE, HIS VITAL  SIGNS ARE AS FOLLOWS: Temperature 97.5, heart rate 93 per minute, respirations 22 per minute, blood pressure 124/78 mmHg, he was saturating 97% on room air.  CARDIOVASCULAR: S1, S2 normal. No murmurs, rubs or gallop.  LUNGS: Clear to auscultation bilaterally. No wheezing, rales, rhonchi or crepitation.  ABDOMEN: Soft, benign.  NEUROLOGIC: Nonfocal examination. All other physical examination remained at his baseline.   DISCHARGE MEDICATIONS: 1.  Atenolol 25 mg p.o. daily.  2.  Thiouracil 50 mg p.o. daily.  3.  Plavix 75 mg p.o. daily.  4.  Metformin/sitagliptin 500/50 one tablet p.o. b.i.d.  5.  Ezetimibe/simvastatin 10/40 one tablet p.o. daily.  6.  Loratadine 10 mg p.o. daily.  7.  Centrum Silver once daily.  8.  PreserVision 1 tablet twice a day.  9.  Mucinex DM 1 tablet twice a day as needed.  10.  Fluticasone/salmeterol 250/50 one puff inhaled twice a day.  11.  Spiriva once daily.  12.  Levaquin 500 mg p.o. daily for 4 more days.   DISCHARGE DIET: Regular.   DISCHARGE ACTIVITY: As tolerated.   DISCHARGE INSTRUCTIONS AND FOLLOWUP: The patient was instructed to follow up with his primary care physician, Dr. Bryon Lions, in 1 to 2 weeks.   TOTAL TIME DISCHARGING THIS PATIENT: 55 minutes.    ____________________________ Lucina Mellow. Manuella Ghazi, MD vss:cs D: 10/10/2013 17:27:00 ET T: 10/10/2013 19:24:29 ET JOB#: [id number]  cc: Nalleli Largent S. Manuella Ghazi, MD, <Dictator> Dr. Bryon Lions Lucina Mellow [facility] MD ELECTRONICALLY SIGNED 10/13/2013 17:06

## 2015-03-02 NOTE — H&P (Signed)
PATIENT NAME:  Dylan Knight, HOCHSTETLER MR#:  [id number] DATE OF BIRTH:  Sep 18, 1924  DATE OF ADMISSION:  10/07/2013  CHIEF COMPLAINT:  Generalized weakness, productive cough.   HISTORY OF PRESENT ILLNESS:  Mr. Gentle is an 81 year old male with a history of COPD secondary to prior tobacco abuse, diabetes mellitus on oral medications, peripheral vascular disease with history of prior stroke who presents with a three-day history of cough productive of yellow sputum, increasing dyspnea both with exertion and rest and progressive muscle weakness to the point where he is unable to ambulate without assistance.  Symptoms have been progressing over the last three days.  He has had no recent travel or contact with sick patients.  He did receive his flu shot in September.  He is accompanied by his wife who states that he was brought to the Emergency Room today when he was too weak to stand without assistance.  She notes that he has had anorexia and has been eating and drinking very little over the last several days, just a few sips of chicken noodle soup.  He has noticed decreased urination.  He has not been using any inhalers.  He does have shortness of breath with minimal exertion.  In the Emergency Room he was noted to be hyponatremic with a right upper lobe infiltrate on chest x-ray.  He received Levaquin and ceftriaxone by the ED physician.   PRIMARY CARE PHYSICIAN:  Dr. Bryon Lions and a PA Jefferson Fuel, both in  [place].   PAST MEDICAL HISTORY: 1.  Peripheral vascular disease, status post aortic aneurysm repair in 2003, right femoral bypass and right carotid endarterectomy.  2.  Diabetes mellitus, well-controlled on oral medications.  3.  Hyperlipidemia.  4.  Hyperthyroidism x 10 years, maintained on PTU.  5.  COPD managed by Dr. Baltazar Apo of [facility].  6.  Macular degeneration of the left eye.  7.  History of stroke in 2005 with right upper extremity weakness.   MEDICATIONS:  Vytorin 10/40  1 tablet once daily, propylthiouracil tablet 50 mg 1 tablet once daily in the morning, PreserVision 1 tablet twice daily, Plavix 75 mg daily, loratadine 10 mg daily, Janumet 50/500 1 tablet twice daily, chlorthalidone 25 mg once daily, Centrum Silver multivitamin 1 daily, atenolol 100 mg once daily.   PAST SURGICAL HISTORY:   1.  He underwent aneurysm repair of an aorta in 2003.  2.  Right femoral bypass, date unknown.  3.  History of carotid right endarterectomy. 4.  History of lumbar and cervical spine surgery, date unknown.   5.  History of left cataract surgery recently.    ALLERGIES:  He has an allergy to IV CONTRAST.   Last hospitalization was 10/12/2012 through 10/15/2012 for pneumonia with hyponatremia.   FAMILY HISTORY:  Notable for a stroke in his father and mother, and two brothers with cancer.   SOCIAL HISTORY:  He is married.  He walks usually without a walker and is independent of all ADLs.  He quit smoking in 2005 after his stroke.   REVIEW OF SYSTEMS:   1.  Positive for subjective chills and fever.   2.  Positive for fatigue and weakness.   3.  He has a history of cataracts.  4.  History of seasonal rhinitis.  5.  History of hearing loss.  6.  History of coughing, productive of purulent sputum, dyspnea and COPD per HPI.  CARDIOVASCULAR:  Review of systems negative for chest pain, but does note orthopnea and  dyspnea on exertion.  GASTROINTESTINAL:  Review of systems is negative for nausea, vomiting, diarrhea, but he has noted anorexia and decreased by mouth intake.   GENITOURINARY:  He denies dysuria.  He has chronic urinary hesitancy.   ENDOCRINE:  He has no history of polyuria or nocturia.  He does have a history of thyroid problems with overactive thyroid.   HEMATOLOGY:  He has no history of anemia, easy bruising or bleeding, but does take Plavix.   MUSCULOSKELETAL:  He has occasional neck and low back pain secondary to degenerative disk disease.  He does have a history  of right upper extremity weakness secondary to stroke.  He has no history of anxiety or insomnia.   PHYSICAL EXAMINATION: GENERAL:  This is an elderly male who appears to be chronically ill.  He is in no apparent distress currently.  VITAL SIGNS:  Temperature is 97.6, pulse is 85 and regular, respirations are 20 to 22 per minute.  Blood pressure is 144/77, pulse ox is 94% on room air at rest.  HEENT:  Pupils are equal, round and reactive to light. Sclerae are anicteric.  Oropharynx is benign.  NECK:  Supple without lymphadenopathy, JVD, thyromegaly or carotid bruits.  LUNGS:  Notable for diminished breath sounds in all lung fields with rhonchi in the upper lobes.  CARDIOVASCULAR:  Regular rate and rhythm.  No murmurs, rubs or gallops.  There is no lower extremity edema and pedal pulses are palpable.  ABDOMEN:  Soft, nontender and nondistended with good bowel sounds and no evidence of hepatosplenomegaly.  Strength was not tested, but the patient is able to move all extremities against gravity in bed.   SKIN:  Skin turgor is poor and skin is warm and dry.  LYMPH:  There is bilateral cervical lymphadenopathy, but no supraclavicular or axillary adenopathy.  NEUROLOGIC:  Grossly nonfocal.  He is alert and oriented to person, place and time.   ADMISSION LABORATORY DATA:  Sodium is 129, potassium 3.0, chloride 92, bicarb 31, BUN 13, creatinine 1.31, glucose 175, total protein 7.2, albumin 3.6, total bilirubin is 1.5.  AST and ALT are normal.  Cardiac enzymes are normal x 1 set.  TSH was not done.  White count 9.9, hemoglobin 16.1, hematocrit 44.7, platelets are 217.  Pro Time 13.4 and INR is 1.0.  Urinalysis is normal.  PA and lateral chest x-ray shows a mild central right upper lobe opacity suggestive of pneumonia.  Normal heart size.  Degenerative changes of the visualized thoracolumbar spine.   ASSESSMENT AND PLAN: 1.  Community-acquired pneumonia superimposed on a patient with chronic obstructive  pulmonary disease and acute hyponatremia with generalized weakness.  The patient is at high risk for decompensation if he is sent home without IV volume resuscitation and IV antibiotics.  His wife states that he has been too weak to stand independently for the last several days due to his infection.  He has already received Rocephin and Levaquin in the Emergency Room.  We will continue Rocephin for another 24 hours.  2.  Dehydration/hyponatremia.  Gentle IV fluids and repeat sodium level in 4 hours, as this may also be syndrome of inappropriate antidiuretic hormone secretion from pneumonia.  3.  Generalized weakness secondary to hyponatremia and acute illness, a physical therapy consult, fall for cautions.  4.  Hypokalemia.  We will check magnesium level and replace both orally if necessary.  5.  Chronic obstructive pulmonary disease.  We will add Advair 250/50 1 puff twice daily and add  DuoNebs q. 6 hours while awake.  6.  Peripheral arterial disease.  The patient is taking Plavix and aspirin at home.  He is on deep vein thrombosis precautions with Lovenox, but we will check CBC daily.  7.  Hyperthyroidism.  Unclear why the patient did not have ablation given his age.  TSH and Free T4 are pending.  8.  The patient is FULL CODE.   Estimated time of care 60 minutes.    ____________________________ Deborra Medina, MD tt:ea D: 10/07/2013 15:22:57 ET T: 10/07/2013 16:20:22 ET JOB#: [id number]  cc: Deborra Medina, MD, <Dictator> Deborra Medina MD ELECTRONICALLY SIGNED 12/08/2013 17:56

## 2015-03-10 NOTE — Discharge Summary (Signed)
PATIENT NAME:  Dylan Knight, Dylan Knight MR#:  [id number] DATE OF BIRTH:  23-Feb-1924  DATE OF ADMISSION:  11/24/2014 DATE OF DISCHARGE:  11/27/2014  For a detailed note, please see the history and physical done on admission by Dr. Margaretmary Eddy.   DIAGNOSES AT DISCHARGE: Dizziness, double vision suspected due to a cerebrovascular accident, but it has been ruled out, previous history of cerebrovascular accident, hypertension, hyperlipidemia, history of hypothyroidism, chronic obstructive pulmonary disease.   DISCHARGE DIET: The patient is being discharged home on a low-sodium, low-fat, carb -controlled diet.   DISCHARGE ACTIVITY: As tolerated.   FOLLOWUP CARE: Follow up with his primary in the next 1-2 weeks. The patient is being arranged for outpatient physical therapy services.   DISCHARGE MEDICATIONS: Propylthiouracil 50 mg in the morning, Plavix 75 mg daily, metformin/Januvia 500/50 one tab b.i.d., Zetia/simvastatin 10/40 one tab daily, loratadine 10 mg daily, Centrum multivitamin daily, PreserVision multivitamin 1 tab b.i.d., Mucinex 1 tab b.i.d., atenolol/chlorthalidone 50/25, 1/2 a tab daily, Advair 250/50 one puff b.i.d., Spiriva 1 puff daily, tramadol 50 mg q. 8 hours as needed, methocarbamol 500 mg daily as needed for muscle spasms.   CONSULTANTS DURING THE HOSPITAL COURSE: Dr. Jayme Cloud and Dr. Irish Elders  from neurology.   PERTINENT STUDIES DONE DURING THE HOSPITAL COURSE: A CT scan of the head done without contrast showing old left parietal infarct with encephalomalacia. The patient also had an ultrasound of the carotids showing a prior left internal carotid stenting with mild to moderate neointimal hyperplasia within the stent causing at least 50% diameter stenosis. No evidence of hemodynamically significant stenosis involving the right carotid bifurcation, antegrade flow in the left vertebral. An MRI and MRA of the brain and neck showing no acute intracranial abnormality, chronic left parietal infarct.  Moderately motion degraded exam. Occluded left vertebral artery, patent right vertebral artery with mild intracranial narrowing, mild cavernous carotid irregularity and narrowing bilaterally, patent ACAs and MCAs  proximally.   HOSPITAL COURSE: This is an 81 year old male with medical problems as mentioned above, presented to the hospital with double vision and dizziness.  1. Double vision and dizziness. This was suspected to be secondary to a possible cerebrovascular accident. The patient apparently has a previous history of cerebrovascular accident, was admitted to the hospital, maintained on his Plavix and statin. CT head initially showed no acute cerebrovascular accident. A neurology consult was obtained. They recommended getting an MRI and MRA of the brain and neck, which was done shortly after admission. They did not show any evidence of acute stroke, but an old left parietal stroke and no evidence of any acute abnormalities. The patient was seen by physical therapy. They recommended home health services, although the patient wanted outpatient services, which was arranged. The patient's clinical symptoms since admission have significantly improved and therefore, he is being discharged home on his maintenance medications as stated with follow-up with his primary as an outpatient.  2. History of previous cerebrovascular accident and a carotid stent. The patient will continue his Plavix and statin.  3. Chronic obstructive pulmonary disease. The patient had no acute chronic obstructive pulmonary disease exacerbation. We will continue his Advair and Spiriva.  4. Diabetes. The patient was maintained on sliding scale insulin, but will resume his Januvia and metformin as stated.  5. History of hyperthyroidism. The patient will continue his propylthiouracil.  6. Hypertension. The patient remained hemodynamically stable. He will continue his atenolol and chlorthalidone/ hydrochlorothiazide.  CODE STATUS: The  patient is a full code.   DISPOSITION:  He was discharged home.   TIME SPENT: 40 minutes.    ____________________________ Belia Heman. Verdell Carmine, MD vjs:mw D: 11/28/2014 15:34:07 ET T: 11/28/2014 19:32:20 ET JOB#: [id number]  cc: Belia Heman. Verdell Carmine, MD, <Dictator> PCP, Unknown cc  Henreitta Leber MD ELECTRONICALLY SIGNED 12/04/2014 11:54

## 2015-03-10 NOTE — Consult Note (Signed)
Referring Physician:  Nicholes Mango :   Primary Care Physician:  Nicholes Mango : [facility], [address], [place], [place] [address], [place] [phone]  Reason for Consult: Admit Date: 24-Nov-2014  Chief Complaint: dizziness  Reason for Consult: CVA   History of Present Illness: History of Present Illness:   81 yo RHD M presents to [facility] secondary to acute onset of dizziness and diplopia.  Pt denies any vertigo, weakness, speech changes or numbness.  Per ER physician, when pt looked to R there was no problems but when he looked to L that his R would not move.  This is confirmed by pt who said now all of his symptoms resolved.  Pt was not a tPA candidate due to onset of his symptoms > 3 hours before arrival.  No other complaints.  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:   mid back surgery:   hyperthyroidism:   cva:   htn:   2007 carotid stenosis:   Aortic Repair:   Past Medical/ Surgical Hx:  Past Medical History personally reviewed by me as above   Past Surgical History personally reviewed by me as above   Home Medications: Medication Instructions Last Modified Date/Time  propylthiouracil 50 mg oral tablet 1 tab(s) orally once a day (in the morning) 16-Jan-16 16:39  clopidogrel 75 mg oral tablet 1 tab(s) orally once a day (in the morning) 16-Jan-16 16:39  metFORMIN-sitaGLIPtin 500 mg-50 mg oral tablet 1 tab(s) orally 2 times a day 16-Jan-16 16:39  ezetimibe-simvastatin 10 mg-40 mg oral tablet 1 tab(s) orally once a day (in the evening) 16-Jan-16  16:39  loratadine 10 mg oral tablet 1 tab(s) orally once a day (in the evening) at 5pm. 16-Jan-16 16:39  Centrum Silver Therapeutic Multiple Vitamins with Minerals oral tablet 1 tab(s) orally once a day (in the morning) 16-Jan-16 16:39  PreserVision Antioxidant Multiple Vitamins and Minerals oral tablet 1 tab(s) orally 2 times a day 16-Jan-16 16:39  Mucinex DM 1 tab(s) orally 2 times a day, As Needed for cough 16-Jan-16 16:39  atenolol-chlorthalidone 50 mg-25 mg oral tablet 0.5 tab(s) orally once a day (in the morning) 16-Jan-16 16:39  fluticasone-salmeterol 250 mcg-50 mcg inhalation powder 1 puff(s) inhaled 2 times a day 16-Jan-16 16:39  Spiriva 18 mcg inhalation capsule 1 cap(s) via handihaler once a day 16-Jan-16 16:39  traMADol 50 mg oral tablet 1 tab(s) orally every 8 hours, As Needed - for Pain 16-Jan-16 16:39  methocarbamol 500 mg oral tablet 1 tab(s) orally once a day as needed for muscle spasms. 16-Jan-16 16:39   Allergies:  IVP Dye: Unknown  Allergies:  Allergies NKDA   Social/Family History: Employment Status: retired  Lives With: alone  Living Arrangements: house  Social History: no tob, no EtOh, no illicits  Family History: no seizures, no strokes   Vital Signs: **Vital Signs.:   16-Jan-16 20:01  Vital Signs Type Routine  Temperature Temperature (F) 97.4  Celsius 36.3  Temperature Source oral  Pulse Pulse 75  Respirations Respirations 18  Systolic BP Systolic BP 322  Diastolic BP (mmHg) Diastolic BP (mmHg) 81  Mean BP 97  Pulse Ox % Pulse Ox % 93  Pulse Ox Activity Level  At rest  Oxygen Delivery Room Air/ 21 %   Physical Exam: General: nl weight, NAD  HEENT: normocephalic, sclera nonicteric, oropharynx clear  Neck: supple, no JVD, no bruits  Chest: CTA B, no wheezing, good movement  Cardiac: RRR, no murmurs, no edema, 2+ pulses  Extremities: no c/c/e, FROM   Neurologic Exam: Mental Status: alert and oriented x 3, normal speech and language, follows  complex commands  Cranial Nerves: PERRLA, EOMI, nl VF, face symmetric, tongue midline, shoulder shrug equal  Motor Exam: 5/5 B normal, tone, no tremor  Deep Tendon Reflexes: 0/4 B, mute plantars B  Sensory Exam: pinprick, temperature, and vibration intact B  Coordination: FTN and HTS WNL   Lab Results: Hepatic:  16-Jan-16 13:59   Bilirubin, Total 0.9  Alkaline Phosphatase 51 (46-116 NOTE: New Reference Range 05/29/14)  SGPT (ALT) 25 (14-63 NOTE: New Reference Range 05/29/14)  SGOT (AST) 20  Total Protein, Serum 6.9  Albumin, Serum 3.9  Routine Chem:  16-Jan-16 13:59   Glucose, Serum  135  BUN 9  Creatinine (comp)  1.35  Sodium, Serum  134  Potassium, Serum 3.6  Chloride, Serum  96  CO2, Serum 31  Calcium (Total), Serum 9.1  Osmolality (calc) 269  eGFR (African American) >60  eGFR (Non-African American)  53 (eGFR values <20m/min/1.73 m2 may be an indication of chronic kidney disease (CKD). Calculated eGFR, using the MRDR Study equation, is useful in  patients with stable renal function. The eGFR calculation will not be reliable in acutely ill patients when serum creatinine is changing rapidly. It is not useful in patients on dialysis. The eGFR calculation may not be applicable to patients at the low and high extremes of body sizes, pregnant women, and vegetarians.)  Anion Gap 7  Cardiac:  16-Jan-16 13:59   Troponin I < 0.02 (0.00-0.05 0.05 ng/mL or less: NEGATIVE  Repeat testing in 3-6 hrs  if clinically indicated. >0.05 ng/mL: POTENTIAL  MYOCARDIAL INJURY. Repeat  testing in 3-6 hrs if  clinically indicated. NOTE: An increase or decrease  of 30% or more on serial  testing suggests a  clinically important change)  Routine UA:  16-Jan-16 13:59   Color (UA) Yellow  Clarity (UA) Clear  Glucose (UA) Negative  Bilirubin (UA) Negative  Ketones (UA) Negative  Specific Gravity (UA) 1.008  Blood (UA) Negative  pH (UA) 7.0  Protein (UA) Negative  Nitrite (UA)  Negative  Leukocyte Esterase (UA) Negative (Result(s) reported on 24 Nov 2014 at 04:54PM.)  RBC (UA) <1 /HPF  WBC (UA) <1 /HPF  Bacteria (UA) NONE SEEN  Epithelial Cells (UA) NONE SEEN  Result(s) reported on 24 Nov 2014 at 04:54PM.  Routine Coag:  16-Jan-16 13:59   Prothrombin 12.6  INR 1.0 (INR reference interval applies to patients on anticoagulant therapy. A single INR therapeutic range for coumarins is not optimal for all indications; however, the suggested range for most indications is 2.0 - 3.0. Exceptions to the INR Reference Range may include: Prosthetic heart valves, acute myocardial infarction, prevention of myocardial infarction, and combinations of aspirin and anticoagulant. The need for a higher or lower target INR must be assessed individually. Reference: The Pharmacology and Management of the Vitamin K  antagonists: the seventh ACCP Conference on Antithrombotic and Thrombolytic Therapy. CGURKY.7062Sept:126 (3suppl): [id number] A HCT value >55%  may artifactually increase the PT.  In one study,  the increase was an average of 25%. Reference:  "Effect on Routine and Special Coagulation Testing Values of Citrate Anticoagulant Adjustment in Patients with High HCT Values." American Journal of Clinical Pathology 2006;126:400-405.)  Activated PTT (APTT)  38.4 (A HCT value >55% may artifactually increase the APTT. In one study, the increase was an average of 19%. Reference: "Effect on Routine and Special Coagulation Testing Values of Citrate Anticoagulant Adjustment in Patients with High HCT Values." American Journal of Clinical Pathology 2006;126:400-405.)  Routine Hem:  16-Jan-16 13:59   WBC (CBC) 7.2  RBC (CBC) 4.89  Hemoglobin (CBC) 16.4  Hematocrit (CBC) 47.9  Platelet Count (CBC) 213 (Result(s) reported on 24 Nov 2014 at 02:23PM.)  MCV 98  MCH 33.5  MCHC 34.3  RDW 13.9   Radiology Results: CT:    16-Jan-16 14:22, CT Head Without Contrast  CT Head Without  Contrast   REASON FOR EXAM:    visual changes  COMMENTS:   May transport without cardiac monitor    PROCEDURE: CT  - CT HEAD WITHOUT CONTRAST  - Nov 24 2014  2:22PM     CLINICAL DATA:  Dizzy spells today lunch.    EXAM:  CT HEAD WITHOUT CONTRAST    TECHNIQUE:  Contiguous axial images were obtained from the base of the skull  through the vertex without intravenous contrast.    COMPARISON:  None.  FINDINGS:  Old left parietal infarct with encephalomalacia. Diffuse cerebral  atrophy. No No acute intracranial abnormality. Specifically, no  hemorrhage, hydrocephalus, mass lesion, acute infarction, or  significant intracranial injury. No acute calvarial abnormality.  Visualized paranasal sinuses and mastoids clear. Orbital soft  tissues unremarkable.     IMPRESSION:  Old left parietal infarct with encephalomalacia. No acute  intracranial abnormality.      Electronically Signed    By: Rolm Baptise M.D.    On: 11/24/2014 14:43         Verified By: Raelyn Number, M.D.,   Radiology Impression: Radiology Impression: CT of head personally reviewed by me and shows an old L MCA infarct, mild white matter changes   Impression/Recommendations: Recommendations:   prior notes reviewed by me reviewed by me   Probable brainstem stroke-  pt endorses symptoms consistent with a MLF lesion on the L that resolved, pt still has some slight upgaze problems, otherwise exam is normal Old L MCA infarct-  trace residual deficits MRI of brain MRA of head and neck agree with adding ASA 11m daily with plavix check B12, lipids and Hem A1c adjsut for LDL < 700 needs BP < 130/80 as outpatient therapy consults will likely be able to go home tomorrow but will follow  Electronic Signatures: SJamison Neighbor(MD)  (Signed 16-Jan-16 20:28)  Authored: REFERRING PHYSICIAN, Primary Care Physician, Consult, History of Present Illness, Review of Systems, PAST MEDICAL/SURGICAL HISTORY, HOME MEDICATIONS,  ALLERGIES, Social/Family History, NURSING VITAL SIGNS, Physical Exam-, LAB RESULTS, RADIOLOGY RESULTS, Recommendations   Last Updated: 16-Jan-16 20:28 by SJamison Neighbor(MD)

## 2015-03-10 NOTE — H&P (Signed)
PATIENT NAME:  Knight, Dylan MR#:  [id number] DATE OF BIRTH:  Jul 23, 1924  DATE OF ADMISSION:  11/24/2014  PRIMARY CARE PHYSICIAN: Deborra Medina, MD  REFERRING EMERGENCY ROOM PHYSICIAN: Rodrigo Ran, PA  CHIEF COMPLAINT: Dizziness and double vision.   HISTORY OF PRESENT ILLNESS: The patient is an 81 year old Caucasian male with a past medical history of CVA in the year 2005 with residual right-sided deficit. He is presenting to the ED with a chief complaint of double vision and dizziness. He was in his usual state of health until 2 hours ago. Approximately 1 hour prior to his ER arrival, the patient was having double vision when he turned to the left side associated with dizziness. Denies any headache or blurry vision. No speech disturbances. No loss of consciousness. No worsening of his weakness. The patient was concerned about double vision and dizziness and came into the ED. Denies any nausea, vomiting, abdominal pain. Wife is at bedside. CAT scan of the head did not reveal any acute infarct or bleeds. The ER PA, Mr. De Burrs, talked to the on-call neurologist, Dr. Tamala Julian, who has recommended not to give any t-PA considering his elderly age, risk of bleeding. The patient is resting comfortably during my examination still complaining of double vision when he turns his eyesight towards the left side. No other complaints.   PAST MEDICAL HISTORY: COPD, peripheral vascular disease, diabetes mellitus, hyperthyroidism for 10 years maintained on propylthiouracil, COPD managed by [facility] pulmonology, macular degeneration of the left eye, history of stroke in the year 2005 with right upper extremity weakness.   PAST SURGICAL HISTORY: Status post aortic aneurysm repair in the year 2003, right femoral bypass and right carotid endarterectomy, left carotid stent placement in the year 2005.  ALLERGIES: IVP DYE, DEVELOPS RASH.   PSYCHOSOCIAL HISTORY: Lives at home with wife. He smoked for 63 years and quit smoking.  Currently denies any history of smoking, alcohol or illicit drug usage.   FAMILY HISTORY: Both the father and mother have history of stroke, two brothers with cancer.   REVIEW OF SYSTEMS: CONSTITUTIONAL: Denies any fever or fatigue. Complaining of weakness.  EYES: Denies blurry vision, but complaining of double vision when turning his eyes towards left. Denies any glaucoma.  ENT: Denies epistaxis, discharge.  RESPIRATORY: Denies cough. Has chronic history of COPD, not on oxygen.  CARDIOVASCULAR: No chest pain, palpitations. Complaining of dizziness. No syncope.  GASTROINTESTINAL: Denies nausea, vomiting, diarrhea, abdominal pain. Has a ventral hernia. Denies any hematemesis or melena.  GENITOURINARY: No dysuria or hematuria.  ENDOCRINE: Denies polyuria, nocturia. Has hyperthyroidism.  HEMATOLOGIC AND LYMPHATIC: No anemia, easy bruising, bleeding.  INTEGUMENTARY: No acne, rash, lesions.  MUSCULOSKELETAL: No joint pain in the neck and back. Denies any gout.  NEUROLOGIC: Denies vertigo. Complaining of dizziness. Has an old history of stroke with right upper extremity residual weakness.  PSYCHIATRIC: No ADD, OCD, anxiety, depression.   HOME MEDICATIONS: Spiriva 18 mcg inhalation once daily, propylthiouracil 50 mg 1 tablet p.o. once daily in the a.m., Mucinex DM 1 tablet p.o. 2 times a day, metformin sitagliptin 500/50 one tablet p.o. 2 times a day, Loratadine 10 mg once daily, Flonase 1 puff inhalation 2 times a day, Zetia with simvastatin combination 10/40 one tablet p.o. once daily, Plavix 75 mg once daily, atenolol 25 mg p.o. once daily.   PHYSICAL EXAMINATION: VITAL SIGNS: Temperature 97.8, pulse 82, respirations 15 to 18, blood pressure 137/50, pulse oxygenation 97%.  GENERAL APPEARANCE: Not in any acute distress. Moderately built  and nourished.  HEENT: Normocephalic, atraumatic. Pupils are equally reacting to light and accommodation. Positive left-sided nystagmus.  NECK: Supple. No JVD.  Status post right-sided carotid endarterectomy. No thyromegaly. Range of motion is intact.  LUNGS: Clear to auscultation bilaterally. No accessory muscle use is noted. No anterior chest wall tenderness on palpation.  CARDIOVASCULAR: S1, S2 normal. Regular rate and rhythm. No murmurs.  GASTROINTESTINAL: Soft. Bowel sounds are positive in all 4 quadrants. Nontender, nondistended. Positive ventral hernia, not incarcerated. Obese.  NEUROLOGIC: Awake, alert, oriented x 3. Cranial nerves II through XII are grossly intact. Motor and sensory are intact except in the right upper extremity with motor being 4/5. No pronator drift. Positive left-sided nystagmus. No deviation of the angle of the mouth. Reflexes are 2+.  EXTREMITIES: No edema. No cyanosis. No clubbing.  SKIN: Warm to touch. Normal turgor. No rashes. No lesions.  MUSCULOSKELETAL: No joint effusion, tenderness, erythema.  PSYCHIATRIC: Normal mood and affect.   LABORATORIES AND IMAGING STUDIES: CAT scan of the head without contrast: Old left parietal infarct with encephalomalacia. No acute intracranial abnormality. Glucose 135, BUN 9, creatinine 1.35, sodium 134, potassium 3.6, chloride 96, CO2 of 31. GFR 53. Anion gap, serum osmolality, and calcium are normal. LFTs are normal. Troponin less than 0.02. CBC is normal. PT 12.6, INR 1.0, activated PTT is 38.4. A 12-lead EKG: Normal sinus rhythm at 76 beats per minute. PR interval is prolonged at 208 milliseconds, has first-degree AV block, no acute ST wave elevations or depressions.   ASSESSMENT AND PLAN: An 81 year old male with a history of old stroke with residual right upper extremity weakness, who is presenting to the Emergency Department with a chief complaint of diplopia and dizziness. Denies any loss of consciousness. Initial CAT scan of the head is negative. As his symptoms started approximately 1 hour prior to his ER arrival, the ER PA talked to on-call neurology, Dr. Tamala Julian, who has recommended no  TPA considering his age and bleeding risks.  1.  Transient ischemic attack versus cerebrovascular accident with symptoms of diplopia and dizziness, positive nystagmus on the left side. The patient has old history of stroke with residual right upper extremity weakness. Initial CAT scan of the head is negative. We will admit to observation status, monitor him on telemetry. We will get neurologic checks and do a stroke workup with MRA of the brain, carotid Dopplers and 2-D echocardiogram. The patient has right-sided carotid endarterectomy and left-sided stent placement. He gets carotid Dopplers every 6 months. The patient also reported that he is allergic to intravenous pyelogram intravenous pyelogram dye, just rash, but he tolerates that if we provide Benadryl. We will put a consult to neurology, Dr. Tamala Julian. We will provide aspirin 325 mg and continue his home medication Plavix. The patient is already on Zetia and simvastatin, will continue the same. We will check fasting lipid panel. A PT consult is placed.  2.  The patient is complaining of dizziness. Will get orthostatic blood pressures.  3.  Very mild acute kidney injury, probably prerenal. His previous renal function is in the normal range. Will just provide him gentle hydration with 500 mL of IV fluids and check BMP in the morning. Will hold metformin, which is his diabetes medicine.  4.  Chronic history of chronic obstructive pulmonary disease. Continue Spiriva and Advair inhaler. We will provide nebulizer treatments if needed.  5.  Chronic history of diabetes mellitus. Hold metformin in view of renal insufficiency. We will provide him insulin sliding scale during  the hospital course.  6.  Peripheral vascular disease. The patient is following up with vascular surgery as an outpatient on a regular basis as reported by him.  7.  Hypertension. Blood pressure is stable. We will continue atenolol.  8.  Chronic history of ventral hernia, which is not  incarcerated. Not complaining of any abdominal pain. No interventions are needed at this time. We will provide him gastrointestinal prophylaxis with Pepcid and deep vein thrombosis prophylaxis with heparin.   CODE STATUS: He is full code. Wife is the medical power of attorney. Plan of care discussed in detail with the patient and his wife at bedside. They both are aware of the plan.   TOTAL TIME SPENT: 50 minutes.    ____________________________ Nicholes Mango, MD ag:at D: 11/24/2014 16:36:13 ET T: 11/24/2014 17:21:50 ET JOB#: [id number]  cc: Nicholes Mango, MD, <Dictator> Nicholes Mango MD ELECTRONICALLY SIGNED 11/25/2014 14:59

## 2015-05-06 ENCOUNTER — Other Ambulatory Visit: Payer: Self-pay

## 2015-07-10 ENCOUNTER — Other Ambulatory Visit (HOSPITAL_COMMUNITY): Payer: 59

## 2015-07-10 ENCOUNTER — Encounter (HOSPITAL_COMMUNITY): Payer: 59

## 2015-07-10 ENCOUNTER — Ambulatory Visit: Payer: 59 | Admitting: Family

## 2015-07-16 ENCOUNTER — Encounter: Payer: Self-pay | Admitting: Emergency Medicine

## 2015-07-16 ENCOUNTER — Ambulatory Visit (INDEPENDENT_AMBULATORY_CARE_PROVIDER_SITE_OTHER): Payer: Medicare Other | Admitting: Emergency Medicine

## 2015-07-16 VITALS — BP 132/76 | HR 79 | Ht 66.0 in | Wt 172.0 lb

## 2015-07-16 DIAGNOSIS — J449 Chronic obstructive pulmonary disease, unspecified: Secondary | ICD-10-CM | POA: Diagnosis not present

## 2015-07-16 MED ORDER — IPRATROPIUM-ALBUTEROL 0.5-2.5 (3) MG/3ML IN SOLN
3.0000 mL | Freq: Two times a day (BID) | RESPIRATORY_TRACT | Status: DC
Start: 2015-07-16 — End: 2016-12-01

## 2015-07-16 MED ORDER — ALBUTEROL SULFATE HFA 108 (90 BASE) MCG/ACT IN AERS: 2.0000 | INHALATION_SPRAY | Freq: Four times a day (QID) | RESPIRATORY_TRACT | Status: DC | PRN

## 2015-07-16 NOTE — Patient Instructions (Signed)
Please start using your DuoNebs twice a day every day so that we can see if it is helping your breathing Take albuterol 2 puffs up to every 4 hours if needed for shortness of breath.  Follow with Dr Lamonte Sakai in May 2017 or sooner if you have any problems

## 2015-07-16 NOTE — Assessment & Plan Note (Signed)
He has not been taking bronchodilators with any regularity. Not clear to me the benefit it but also unclear that he has ever given them a fair trial. I will reorder DuoNeb for him to start twice a day with possibility of increasing if he does benefit.

## 2015-07-16 NOTE — Progress Notes (Signed)
Subjective:    Patient ID: Dylan Knight, male    DOB: 1924/07/13, 81 y.o.   MRN: [id number]  HPI 81 yo former smoker, 60+ pk-yrs, hx DM, PVD (fem bypass), AAA with stenting, HTN, CVA '05. Was dx with COPD in 12/13 when he was admitted to [facility] for CAP and a flare. He was discharged on Advair and combivent, but he didn't stay on these - poor coordination taking HFA's. He is typically able to exert himself without stopping. Has a daily frequent cough and has had this for over 20 yrs. Productive of white mucous. He doesn't hear wheezing. He has had many episodes of PNA vs AE, averages about once a year. Has been admitted 3 times in the past. Not on any inhaled medications right now, feels at baseline.   ROV 12/13/12 -- follow up for COPD and cough, frequent exacerbations. Had PFT today >> moderately severe AFL, no BD response, normal volumes, decreased DLCO that corrects for Va.  Feels that he has been doing well since last time. Coughs occasionally during the day. The cough is better at night on the tessalon. No clear heartburn sx. Not currently on BD's - difficulty doing them due to coordination. He is taking mucinex-D. No real nasal or head congestion.   ROV 01/27/13 -- COPD with mod severe AFL. He had a URI 2 weeks ago, caught from her wife. He is still having green phlegm. We started Duonebs last time > he didn't like them, didn't fell a benefit. He did them for 5-6 days and then stopped. He remains on mucinex. Benefited from [place].   ROV 04/18/13 -- COPD with mod severe AFL.  He is using DuoNebs bid, still coughs some phlegm but much better than last time.   ROV 11/15/13 -- COPD, moderately severe AFL.  He was unfortunately admitted to [facility] for AE-COPD and PNA, treated with abx, steroids. Happens about once a year, usually in the Fall. He is using duonebs less > about qd instead of bid (he knows he could be using 4x a day). He is active. Able to exert. Occasional cough, minimal cough.    ROV 03/10/14 -- COPD, moderately severe AFL.  They have sold their house and have downsized. No more hospitalizations, no flares since last visit.  He is using duonebs about 2x a week, our plans had been to use qid. Not sure he misses it. Minimal cough or wheeze. Taking loratadine reliably.   ROV 09/26/14 -- follow up visit for COPD. He has been managed on albuterol / atrovent BID. He is uncertain that the medication helps him. He unfortunately fell in July and hurt R leg.  He remains able to get around, he no longer drives. He has recently noticed throat hoarseness. Has clear / yellow nasal drainage. No fever but he has had thick yellow sputum.   ROV 07/16/15 -- follow-up visit for obstructive lung disease and COPD. He is currently using Duonebs, but not reliably. He is not clear that it helps him, so he neglects to use it. It may be decreasing his cough some according to his wife.       Objective:   Physical Exam  Filed Vitals:   07/16/15 0947  BP: 132/76  Pulse: 79  Height: 5\' 6"  (1.676 m)  Weight: 172 lb (78.019 kg)  SpO2: 96%   Gen: Pleasant, elderly man, in no distress,  normal affect  ENT: No lesions,  mouth clear,  oropharynx clear, no postnasal drip  Neck:  No JVD, no TMG, no carotid bruits  Lungs: No use of accessory muscles, distant, clear without rales or rhonchi  Cardiovascular: RRR, heart sounds normal, no murmur or gallops, no peripheral edema  Musculoskeletal: No deformities, no cyanosis or clubbing  Neuro: alert, non focal  Skin: Warm, no lesions or rash     Assessment & Plan:  COPD (chronic obstructive pulmonary disease) He has not been taking bronchodilators with any regularity. Not clear to me the benefit it but also unclear that he has ever given them a fair trial. I will reorder DuoNeb for him to start twice a day with possibility of increasing if he does benefit.

## 2015-07-22 ENCOUNTER — Encounter: Payer: Self-pay | Admitting: Family

## 2015-07-23 ENCOUNTER — Ambulatory Visit (HOSPITAL_COMMUNITY)
Admission: RE | Admit: 2015-07-23 | Discharge: 2015-07-23 | Disposition: A | Discharge status: Home / Self Care | Payer: Medicare Other | Source: Ambulatory Visit | Attending: Family | Admitting: Family

## 2015-07-23 ENCOUNTER — Ambulatory Visit (INDEPENDENT_AMBULATORY_CARE_PROVIDER_SITE_OTHER): Payer: Medicare Other | Admitting: Family

## 2015-07-23 ENCOUNTER — Ambulatory Visit (INDEPENDENT_AMBULATORY_CARE_PROVIDER_SITE_OTHER)
Admission: RE | Admit: 2015-07-23 | Discharge: 2015-07-23 | Disposition: A | Discharge status: Home / Self Care | Payer: Medicare Other | Source: Ambulatory Visit | Attending: Family | Admitting: Family

## 2015-07-23 ENCOUNTER — Other Ambulatory Visit: Payer: Self-pay | Admitting: Family

## 2015-07-23 ENCOUNTER — Encounter: Payer: Self-pay | Admitting: Family

## 2015-07-23 VITALS — BP 132/76 | HR 71 | Temp 97.4°F | Resp 18 | Ht 66.0 in | Wt 172.0 lb

## 2015-07-23 DIAGNOSIS — Z48812 Encounter for surgical aftercare following surgery on the circulatory system: Secondary | ICD-10-CM

## 2015-07-23 DIAGNOSIS — I739 Peripheral vascular disease, unspecified: Secondary | ICD-10-CM

## 2015-07-23 DIAGNOSIS — Z87891 Personal history of nicotine dependence: Secondary | ICD-10-CM | POA: Diagnosis not present

## 2015-07-23 DIAGNOSIS — Z9889 Other specified postprocedural states: Secondary | ICD-10-CM

## 2015-07-23 DIAGNOSIS — Z95828 Presence of other vascular implants and grafts: Secondary | ICD-10-CM

## 2015-07-23 DIAGNOSIS — I6523 Occlusion and stenosis of bilateral carotid arteries: Secondary | ICD-10-CM

## 2015-07-23 DIAGNOSIS — Z959 Presence of cardiac and vascular implant and graft, unspecified: Secondary | ICD-10-CM

## 2015-07-23 NOTE — Progress Notes (Signed)
[facility] [place] HISTORY AND PHYSICAL   MRN : [id number]  History of Present Illness:   Dylan Knight is a 81 y.o. male  patient of Dr. Scot Dock who has had a previous right carotid endarterectomy in 2007. The patient has also had a left internal carotid artery stent.  This patient has also undergone previous repair of an abdominal aortic aneurysm in 2003. Also has a right femoropopliteal bypass graft which has been revised in 2005.  He complains of pain in right hip only after walking about 1/2 block, relieved by rest.  He denies ulcers or slow healing wounds.  His worst complaint seems to be his low back and hip pain, he received back injections which did not help.  He sees Dr. Lamonte Sakai for COPD.  Patient has Negative history of TIA or stroke symptom since 2005 at which time he had right hemiparesis, he is yet unable to write very well with his right hand. The patient denies amaurosis fugax or monocular blindness. The patient denies facial drooping. The patient denies receptive or expressive aphasia.   The patient's previous neurologic deficits are Unchanged.  Patient reports new Medical or Surgical History: cataract extraction both eyes.   Pt Diabetic: Yes, "borderline"  Pt smoker: quit in 2005, smoked for 63 years  Pt meds include:  Statin : Yes  Betablocker: Yes  ASA: No:  Other anticoagulants/antiplatelets: Plavix      Current Outpatient Prescriptions  Medication Sig Dispense Refill  . albuterol (PROVENTIL HFA;VENTOLIN HFA) 108 (90 BASE) MCG/ACT inhaler Inhale 2 puffs into the lungs every 6 (six) hours as needed for wheezing or shortness of breath. 1 Inhaler 6  . atenolol (TENORMIN) 25 MG tablet Take 25 mg by mouth daily.    Marland Kitchen atenolol-chlorthalidone (TENORETIC) 50-25 MG per tablet daily.    . benzonatate (TESSALON) 100 MG capsule Take 2 capsules (200 mg total) by mouth 3 (three) times daily as needed for cough. 30 capsule 5  .  clopidogrel (PLAVIX) 75 MG tablet Take 75 mg by mouth daily.    Marland Kitchen ezetimibe-simvastatin (VYTORIN) 10-40 MG per tablet Take 1 tablet by mouth at bedtime.    Marland Kitchen ipratropium (ATROVENT) 0.02 % nebulizer solution Take 2.5 mLs (500 mcg total) by nebulization 4 (four) times daily. 75 mL 6  . ipratropium-albuterol (DUONEB) 0.5-2.5 (3) MG/3ML SOLN Take 3 mLs by nebulization 2 (two) times daily. 360 mL 5  . loratadine (CLARITIN) 10 MG tablet Take 10 mg by mouth daily.    . Multiple Vitamins-Minerals (CENTRUM SILVER PO) Take 1 tablet by mouth daily.    Marland Kitchen propylthiouracil (PTU) 50 MG tablet Take 100 mg by mouth every morning.    . sitaGLIPtan-metformin (JANUMET) 50-500 MG per tablet Take 1 tablet by mouth 2 (two) times daily with a meal.     No current facility-administered medications for this visit.    Past Medical History  Diagnosis Date  . Hypertension   . Peripheral vascular disease   . Hyperlipidemia   . BPH (benign prostatic hyperplasia)   . COPD (chronic obstructive pulmonary disease)   . Peripheral arterial disease 2002  . Stroke 2005  . Diabetes mellitus 2011    Border line  . Macular degeneration   . Carotid artery occlusion     Social History Social History  Substance Use Topics  . Smoking status: Former Smoker -- 1.00 packs/day for 63 years    Types: Cigarettes    Quit date: 04/09/2004  . Smokeless tobacco: Never  Used  . Alcohol Use: No    Family History Family History  Problem Relation Age of Onset  . Stroke Mother   . Hypertension Mother   . Heart disease Father   . Heart attack Father   . Cancer Brother   . Hypertension Brother     Surgical History Past Surgical History  Procedure Laterality Date  . Spine surgery  1960    lumbar spine  . Spine surgery  1980, 2007    cervical spine  . Abdominal aortic aneurysm repair  2003    Aorto-left femoral-right iliac BPG by Dr. Scot Dock  . Toe amputation  08-2001    right 5th toe amputation  . Carotid endarterectomy   Jan. 25,2007    Right CEA  . Pr vein bypass graft,aorto-fem-pop  2005    Revision Right Fem-pop  . Pr vein bypass graft,aorto-fem-pop  2002    Right Fem-pop    Allergies  Allergen Reactions  . Iohexol Hives    Went away with benadryl     Current Outpatient Prescriptions  Medication Sig Dispense Refill  . albuterol (PROVENTIL HFA;VENTOLIN HFA) 108 (90 BASE) MCG/ACT inhaler Inhale 2 puffs into the lungs every 6 (six) hours as needed for wheezing or shortness of breath. 1 Inhaler 6  . atenolol (TENORMIN) 25 MG tablet Take 25 mg by mouth daily.    Marland Kitchen atenolol-chlorthalidone (TENORETIC) 50-25 MG per tablet daily.    . benzonatate (TESSALON) 100 MG capsule Take 2 capsules (200 mg total) by mouth 3 (three) times daily as needed for cough. 30 capsule 5  . clopidogrel (PLAVIX) 75 MG tablet Take 75 mg by mouth daily.    Marland Kitchen ezetimibe-simvastatin (VYTORIN) 10-40 MG per tablet Take 1 tablet by mouth at bedtime.    Marland Kitchen ipratropium (ATROVENT) 0.02 % nebulizer solution Take 2.5 mLs (500 mcg total) by nebulization 4 (four) times daily. 75 mL 6  . ipratropium-albuterol (DUONEB) 0.5-2.5 (3) MG/3ML SOLN Take 3 mLs by nebulization 2 (two) times daily. 360 mL 5  . loratadine (CLARITIN) 10 MG tablet Take 10 mg by mouth daily.    . Multiple Vitamins-Minerals (CENTRUM SILVER PO) Take 1 tablet by mouth daily.    Marland Kitchen propylthiouracil (PTU) 50 MG tablet Take 100 mg by mouth every morning.    . sitaGLIPtan-metformin (JANUMET) 50-500 MG per tablet Take 1 tablet by mouth 2 (two) times daily with a meal.     No current facility-administered medications for this visit.     REVIEW OF SYSTEMS: See HPI for pertinent positives and negatives.  Physical Examination Filed Vitals:   07/23/15 1559 07/23/15 1600  BP: 127/68 132/76  Pulse: 76 71  Temp: 97.4 F (36.3 C)   Resp: 18   Height: 5\' 6"  (1.676 m)   Weight: 172 lb (78.019 kg)   SpO2: 95%    Body mass index is 27.77 kg/(m^2).  General: WDWN male in NAD   GAIT: antalgic,   Eyes: PERRLA  Pulmonary: limited air movement in all fields, occassional mosit repsirations, no rales, no rhonchi & no wheezing,  Cardiac: regular rhythm, no detected murmur  Vascular:  Vessel  Right  Left   Radial  2+Palpable  1+Palpable   Carotid  Audible, without bruit  Audible, without bruit   Aorta  Not palpable  N/A   Femoral  Not Palpable  1+Palpable   Popliteal  Not palpable  Not palpable   PT  Not Palpable  Not Palpable   DP  not Palpable  1+  Palpable    Gastrointestinal: soft, nontender, BS WNL, no r/g, no palpable masses;  Musculoskeletal: No muscle atrophy/wasting. M/S 5/5 throughout except 4.5/5 in right upper extremity, Extremities without ischemic changes. Right 5th toe is surgically absent.  Neurologic: A&O X 3; Appropriate Affect,  Speech is normal  CN 2-12 intact , Pain and light touch intact in extremities , Motor exam as listed above         Non-Invasive Vascular Imaging (07/23/2015):  CEREBROVASCULAR DUPLEX EVALUATION     INDICATION: Carotid stenosis    PREVIOUS INTERVENTION(S): Right carotid endarterectomy 12/03/2005; Left internal carotid artery stent 2003    DUPLEX EXAM:     RIGHT  LEFT  Peak Systolic Velocities (cm/s) End Diastolic Velocities (cm/s) Plaque LOCATION Peak Systolic Velocities (cm/s) End Diastolic Velocities (cm/s) Plaque  108 17  CCA PROXIMAL 115 23   85 15  CCA MID 94 20   48 6  CCA DISTAL 83 18   60 9  ECA 152 16 HT  53 14 HM ICA PROXIMAL STENT STENT   81 21  ICA MID STENT STENT   68 16  ICA DISTAL 64 13     carotid endarterectomy ICA / CCA Ratio (PSV) ICA stent  Antegrade Vertebral Flow Retrograde  542 Brachial Systolic Pressure (mmHg) 706  Triphasic Brachial Artery Waveforms Triphasic    Peak Systolic Velocities (cm/s) End Diastolic Velocities (cm/s) Plaque STENT (2003 Left ICA  ) Peak Systolic Velocities (cm/s) End Diastolic Velocities (cm/s) Plaque     PROXIMAL 53 8       MID 110 28 HM     DISTAL 112 30     Plaque Morphology:  HM = Homogeneous, HT = Heterogeneous, CP = Calcific Plaque, SP = Smooth Plaque, IP = Irregular Plaque    ADDITIONAL FINDINGS: Right subclavian artery PSV80cm/sec; Left subclavian artery PSV86cm/sec    IMPRESSION: Right internal carotid artery is patent with history of carotid endarterectomy, no hyperplasia or hemodynamically significant stenosis present. Left internal carotid artery stent is patent, mild hyperplasia present suggestive of less than 40% stenosis. Abnormal retrograde left vertebral artery present.    Compared to the previous exam:  Essentially unchanged since previous study on 07/04/2014.      LOWER EXTREMITY ARTERIAL DUPLEX EVALUATION    INDICATION: Peripheral Vascular Disease    PREVIOUS INTERVENTION(S): Aortobi-femoral Bypass graft 2003; Right femoral to popliteal artery bypass graft 08/25/2001, revision 10/14/2004.    DUPLEX EXAM:     RIGHT  LEFT   Peak Systolic Velocity (cm/s) Ratio (if abnormal) Waveform  Peak Systolic Velocity (cm/s) Ratio (if abnormal) Waveform  140  T Inflow Artery     86  B Proximal Anastomosis     84  B Proximal Graft     85  T Mid Graft     83  B  Distal Graft     63  B Distal Anastomosis     43/36  B/B Outflow Artery     1.04/0.45 Today's ABI / TBI 0.71/0.35  1.01/0.70 Previous ABI / TBI (07/04/2014  ) 0.79/0.55    Waveform:    M - Monophasic       B - Biphasic       T - Triphasic  If Ankle Brachial Index (ABI) or Toe Brachial Index (TBI) performed, please see complete report  ADDITIONAL FINDINGS:     IMPRESSION: Patent right femoral to popliteal artery bypass graft, no hemodynamically significant plaque or stenosis present. Abnormal arterial outflow present involving the right  anterior tibial artery with retrograde flow present. Abnormal arterial outflow present involving the right posterior tibial artery with dampened flow present suggestive of stenosis versus occlusion.     Compared to the previous exam:  Stable ankle brachial indices and decreased toe brachial indices since study on 07/04/2014.     ASSESSMENT:  Dylan Knight is a 81 y.o. male who is s/p right carotid endarterectomy in 2007. The patient has also had a left internal carotid artery stent.  This patient has also undergone previous repair of an abdominal aortic aneurysm in 2003. Also has a right femoropopliteal bypass graft which has been revised in 2005.  He has had no stroke or TIA activity since 2005. His walking is limited by low back pain, states arthritis.   Today's carotid Duplex suggests right internal carotid artery is patent with history of carotid endarterectomy, no hyperplasia or hemodynamically significant stenosis present. Left internal carotid artery stent is patent, mild hyperplasia present suggestive of less than 40% stenosis. Essentially unchanged since previous study on 07/04/2014.   Today's right LE arterial Duplex suggests a patent right femoral to popliteal artery bypass graft, no hemodynamically significant plaque or stenosis present. Abnormal arterial outflow present involving the right anterior tibial artery with retrograde flow present. Abnormal arterial outflow present involving the right posterior tibial artery with dampened flow present suggestive of stenosis versus occlusion. Stable ankle brachial indices and decreased toe brachial indices since study on 07/04/2014. All biphasic waveforms on ABI's.   Face to face time with patient was 25 minutes. Over 50% of this time was spent on counseling and coordination of care.   PLAN:   Continue graduated walking program under safe conditions. Daily seated and standing exercises demonstrated and discussed.   Based on today's exam and non-invasive vascular lab results, the patient will follow up in 6 months with the following tests: ABI's and right LE arterial Duplex, carotid Duplex in a year. He knows to return sooner if  he has concerns about the circulation in his legs.  I discussed in depth with the patient the nature of atherosclerosis, and emphasized the importance of maximal medical management including strict control of blood pressure, blood glucose, and lipid levels, obtaining regular exercise, and cessation of smoking.  The patient is aware that without maximal medical management the underlying atherosclerotic disease process will progress, limiting the benefit of any interventions.  The patient was given information about stroke prevention and what symptoms should prompt the patient to seek immediate medical care.  The patient was given information about PAD including signs, symptoms, treatment, what symptoms should prompt the patient to seek immediate medical care, and risk reduction measures to take. Thank you for allowing [place] to participate in this patient's care.  Clemon Chambers, RN, MSN, FNP-C [facility] Office: [phone]  Clinic MD: Trula Slade on call  07/23/2015 4:14 PM

## 2015-07-23 NOTE — Patient Instructions (Signed)

## 2015-07-26 ENCOUNTER — Encounter (INDEPENDENT_AMBULATORY_CARE_PROVIDER_SITE_OTHER): Payer: Medicare Other | Admitting: Ophthalmology

## 2015-07-26 DIAGNOSIS — E11329 Type 2 diabetes mellitus with mild nonproliferative diabetic retinopathy without macular edema: Secondary | ICD-10-CM

## 2015-07-26 DIAGNOSIS — H43813 Vitreous degeneration, bilateral: Secondary | ICD-10-CM

## 2015-07-26 DIAGNOSIS — E11319 Type 2 diabetes mellitus with unspecified diabetic retinopathy without macular edema: Secondary | ICD-10-CM

## 2015-07-26 DIAGNOSIS — H35033 Hypertensive retinopathy, bilateral: Secondary | ICD-10-CM

## 2015-07-26 DIAGNOSIS — H3531 Nonexudative age-related macular degeneration: Secondary | ICD-10-CM

## 2015-07-26 DIAGNOSIS — I10 Essential (primary) hypertension: Secondary | ICD-10-CM | POA: Diagnosis not present

## 2015-07-26 NOTE — Addendum Note (Signed)
Addended by: Dorthula Rue L on: 07/26/2015 03:36 PM   Modules accepted: Orders

## 2015-12-17 ENCOUNTER — Telehealth: Payer: Self-pay | Admitting: Emergency Medicine

## 2015-12-17 DIAGNOSIS — R059 Cough, unspecified: Secondary | ICD-10-CM

## 2015-12-17 DIAGNOSIS — R05 Cough: Secondary | ICD-10-CM

## 2015-12-17 MED ORDER — BENZONATATE 100 MG PO CAPS: 200.0000 mg | ORAL_CAPSULE | Freq: Three times a day (TID) | ORAL | Status: DC | PRN

## 2015-12-17 NOTE — Telephone Encounter (Signed)
Spoke with pt's wife, requesting tessalon refill to [facility] in [place].  This has been sent.  Nothing further needed.

## 2015-12-27 ENCOUNTER — Encounter: Payer: Self-pay | Admitting: Family

## 2016-01-03 ENCOUNTER — Ambulatory Visit (HOSPITAL_COMMUNITY)
Admission: RE | Admit: 2016-01-03 | Discharge: 2016-01-03 | Disposition: A | Discharge status: Home / Self Care | Payer: Medicare Other | Source: Ambulatory Visit | Attending: Family | Admitting: Family

## 2016-01-03 ENCOUNTER — Ambulatory Visit (INDEPENDENT_AMBULATORY_CARE_PROVIDER_SITE_OTHER): Payer: Medicare Other | Admitting: Family

## 2016-01-03 ENCOUNTER — Ambulatory Visit (INDEPENDENT_AMBULATORY_CARE_PROVIDER_SITE_OTHER)
Admission: RE | Admit: 2016-01-03 | Discharge: 2016-01-03 | Disposition: A | Discharge status: Home / Self Care | Payer: Medicare Other | Source: Ambulatory Visit | Attending: Family | Admitting: Family

## 2016-01-03 ENCOUNTER — Encounter: Payer: Self-pay | Admitting: Family

## 2016-01-03 VITALS — BP 112/71 | HR 82 | Temp 97.0°F | Resp 16 | Ht 66.0 in | Wt 167.0 lb

## 2016-01-03 DIAGNOSIS — Z9889 Other specified postprocedural states: Secondary | ICD-10-CM

## 2016-01-03 DIAGNOSIS — I779 Disorder of arteries and arterioles, unspecified: Secondary | ICD-10-CM | POA: Diagnosis not present

## 2016-01-03 DIAGNOSIS — Z95828 Presence of other vascular implants and grafts: Secondary | ICD-10-CM | POA: Insufficient documentation

## 2016-01-03 DIAGNOSIS — Z48812 Encounter for surgical aftercare following surgery on the circulatory system: Secondary | ICD-10-CM

## 2016-01-03 DIAGNOSIS — I10 Essential (primary) hypertension: Secondary | ICD-10-CM | POA: Diagnosis not present

## 2016-01-03 DIAGNOSIS — I6523 Occlusion and stenosis of bilateral carotid arteries: Secondary | ICD-10-CM | POA: Diagnosis not present

## 2016-01-03 DIAGNOSIS — Z87891 Personal history of nicotine dependence: Secondary | ICD-10-CM | POA: Diagnosis not present

## 2016-01-03 DIAGNOSIS — R0989 Other specified symptoms and signs involving the circulatory and respiratory systems: Secondary | ICD-10-CM | POA: Diagnosis present

## 2016-01-03 DIAGNOSIS — Z959 Presence of cardiac and vascular implant and graft, unspecified: Secondary | ICD-10-CM

## 2016-01-03 DIAGNOSIS — E785 Hyperlipidemia, unspecified: Secondary | ICD-10-CM | POA: Diagnosis not present

## 2016-01-03 DIAGNOSIS — E119 Type 2 diabetes mellitus without complications: Secondary | ICD-10-CM | POA: Insufficient documentation

## 2016-01-03 NOTE — Progress Notes (Signed)
[facility] [place] HISTORY AND PHYSICAL -PAD  History of Present Illness Dylan Knight is a 82 y.o. male patient of Dr. Scot Dock who has had a previous right carotid endarterectomy in 2007. The patient has also had a left internal carotid artery stent.  This patient has also undergone previous repair of an abdominal aortic aneurysm in 2003. Also has a right femoropopliteal bypass graft which has been revised in 2005.  He complains of pain in right hip only after walking about 1/2 block, relieved by rest.  He denies ulcers or slow healing wounds.  His worst complaint seems to be his low back and hip pain, he received back injections which did not help. He has not been walking much due to right radiculopathy type pain; he also has this pain lying in bed; he therefore sleeps in his recliner, his feet are dependent to his heart, and he complains of swelling in his legs.  He sees Dr. Lamonte Sakai for COPD.  Patient has Negative history of TIA or stroke symptom since 2005 at which time he had right hemiparesis, he is yet unable to write very well with his right hand; no subsequent strokes or TIA. The patient denies amaurosis fugax or monocular blindness. The patient denies unilateral facial drooping. The patient denies receptive or expressive aphasia.   Patient reports new Medical or Surgical History: cataract extraction both eyes.   Pt Diabetic: Yes, states in good control Pt smoker: quit in 2005, smoked for 63 years  Pt meds include:  Statin : Yes  Betablocker: Yes  ASA: No:  Other anticoagulants/antiplatelets: Plavix     Past Medical History  Diagnosis Date  . Hypertension   . Peripheral vascular disease ([place])   . Hyperlipidemia   . BPH (benign prostatic hyperplasia)   . COPD (chronic obstructive pulmonary disease) ([place])   . Peripheral arterial disease ([place]) 2002  . Stroke ([place]) 2005  . Diabetes mellitus 2011    Border line  . Macular degeneration   .  Carotid artery occlusion     Social History Social History  Substance Use Topics  . Smoking status: Former Smoker -- 1.00 packs/day for 63 years    Types: Cigarettes    Quit date: 04/09/2004  . Smokeless tobacco: Never Used  . Alcohol Use: No    Family History Family History  Problem Relation Age of Onset  . Stroke Mother   . Hypertension Mother   . Heart disease Father   . Heart attack Father   . Cancer Brother   . Hypertension Brother     Past Surgical History  Procedure Laterality Date  . Spine surgery  1960    lumbar spine  . Spine surgery  1980, 2007    cervical spine  . Abdominal aortic aneurysm repair  2003    Aorto-left femoral-right iliac BPG by Dr. Scot Dock  . Toe amputation  08-2001    right 5th toe amputation  . Carotid endarterectomy  Jan. 25,2007    Right CEA  . Pr vein bypass graft,aorto-fem-pop  2005    Revision Right Fem-pop  . Pr vein bypass graft,aorto-fem-pop  2002    Right Fem-pop    Allergies  Allergen Reactions  . Iohexol Hives    Went away with benadryl     Current Outpatient Prescriptions  Medication Sig Dispense Refill  . albuterol (PROVENTIL HFA;VENTOLIN HFA) 108 (90 BASE) MCG/ACT inhaler Inhale 2 puffs into the lungs every 6 (six) hours as needed for wheezing or  shortness of breath. 1 Inhaler 6  . atenolol (TENORMIN) 25 MG tablet Take 25 mg by mouth daily.    Marland Kitchen atenolol-chlorthalidone (TENORETIC) 50-25 MG per tablet daily.    . benzonatate (TESSALON) 100 MG capsule Take 2 capsules (200 mg total) by mouth 3 (three) times daily as needed for cough. 30 capsule 5  . clopidogrel (PLAVIX) 75 MG tablet Take 75 mg by mouth daily.    Marland Kitchen ezetimibe-simvastatin (VYTORIN) 10-40 MG per tablet Take 1 tablet by mouth at bedtime.    Marland Kitchen ipratropium (ATROVENT) 0.02 % nebulizer solution Take 2.5 mLs (500 mcg total) by nebulization 4 (four) times daily. 75 mL 6  . ipratropium-albuterol (DUONEB) 0.5-2.5 (3) MG/3ML SOLN Take 3 mLs by nebulization 2 (two)  times daily. 360 mL 5  . levothyroxine (SYNTHROID, LEVOTHROID) 25 MCG tablet 1.5 tablets every morning. Take one and one half tablet every morning 30 min. Before breakfast.    . Multiple Vitamins-Minerals (CENTRUM SILVER PO) Take 1 tablet by mouth daily.    Marland Kitchen propylthiouracil (PTU) 50 MG tablet Take 100 mg by mouth every morning.    . sitaGLIPtan-metformin (JANUMET) 50-500 MG per tablet Take 1 tablet by mouth 2 (two) times daily with a meal.    . loratadine (CLARITIN) 10 MG tablet Take 10 mg by mouth daily. Reported on 01/03/2016     No current facility-administered medications for this visit.    ROS: See HPI for pertinent positives and negatives.   Physical Examination  Filed Vitals:   01/03/16 1214  BP: 112/71  Pulse: 82  Temp: 97 F (36.1 C)  TempSrc: Oral  Resp: 16  Height: 5\' 6"  (1.676 m)  Weight: 167 lb (75.751 kg)  SpO2: 95%   Body mass index is 26.97 kg/(m^2).  General: WDWN male in NAD  GAIT: antalgic,  Eyes: PERRLA  Pulmonary: limited air movement in all fields, no rales, no rhonchi & no wheezing,  Cardiac: regular rhythm, no detected murmur  Vascular:  Vessel  Right  Left   Radial  2+Palpable  2+Palpable   Carotid  Audible, without bruit  Audible, without bruit   Aorta  Not palpable  N/A   Femoral  2+ Palpable  2+Palpable   Popliteal  Not palpable  Not palpable   PT  Not Palpable  Not Palpable   DP  faintly Palpable  not Palpable    Gastrointestinal: soft, nontender, BS WNL, no r/g, no palpable masses.   Musculoskeletal: No muscle atrophy/wasting. M/S 5/5 throughout except 4.5/5 in right upper extremity, Extremities without ischemic changes. Right 5th toe is surgically absent. 1-2+ pitting and non pitting edema in both lower legs.  Neurologic: A&O X 3; Appropriate Affect,  Speech is normal  CN 2-12 intact except is hard of hearing, Pain and light touch intact in extremities , Motor exam as listed  above               Non-Invasive Vascular Imaging: DATE: 01/03/2016  ABI (Date: 01/03/2016)  R: 0.94 (1.04, 07/23/15), DP: biphasic, PT: monophasic, TBI: 0.49  L: 0.49 (0.71), DP: monophasic, PT: monophasic, TBI: 0.39   Right LE arterial Duplex: The femoral popliteal venous venous system and calf veins are patent and compressible.  Patent right LE bypass graft with no evidence of significant stenosis.  Abnormal retrograde flow noted in the native posterior tibial outflow artery. No significant change compared to the last exam on 07/23/15; however, the left ABI has declined.     07/23/15 Carotid Duplex:  right  internal carotid artery is patent with history of carotid endarterectomy, no hyperplasia or hemodynamically significant stenosis present. Left internal carotid artery stent is patent, mild hyperplasia present suggestive of less than 40% stenosis. Essentially unchanged since previous study on 07/04/2014.    ASSESSMENT: REMBERT NAUGLE is a 82 y.o. male who is s/p right carotid endarterectomy in 2007. The patient has also had a left internal carotid artery stent.  This patient has also undergone previous repair of an abdominal aortic aneurysm in 2003. Also has a right femoropopliteal bypass graft which has been revised in 2005.  He has had no stroke or TIA activity since 2005.  Today's right LE arterial duplex suggests the femoral popliteal venous venous system and calf veins are patent and compressible.  Patent right LE bypass graft with no evidence of significant stenosis.  Abnormal retrograde flow noted in the native posterior tibial outflow artery. No significant change compared to the last exam on 07/23/15; however, the left ABI has declined from 71% to 49% in 5 moonths. Right ABI declined from normal to mild arterial occlusive disease.  His walking is limited by low back pain and right radiculopathy.  He may not walk enough to elicit claudication sx's in his left  leg. His feet remain dependent to his heart even overnight as he sleeps in his recliner; lying in his bed aggravates his right radiculopathy.    PLAN:  Based on the patient's vascular studies and examination, and after discussing with Dr. Bridgett Larsson, pt will return to clinic in 3 months with ABI's, bilateral LE arterial Duplex, and carotid duplex, on a day that Dr. Scot Dock is in the office. He knows to return sooner if he has concerns about the circulation in his legs.   I discussed in depth with the patient the nature of atherosclerosis, and emphasized the importance of maximal medical management including strict control of blood pressure, blood glucose, and lipid levels, obtaining regular exercise, and continued cessation of smoking.  The patient is aware that without maximal medical management the underlying atherosclerotic disease process will progress, limiting the benefit of any interventions.  The patient was given information about PAD including signs, symptoms, treatment, what symptoms should prompt the patient to seek immediate medical care, and risk reduction measures to take.  Clemon Chambers, RN, MSN, FNP-C [facility] [employer] Phone: [phone]  Clinic MD: Bridgett Larsson  01/03/2016 12:49 PM

## 2016-01-03 NOTE — Patient Instructions (Addendum)
Peripheral Vascular Disease  Peripheral vascular disease (PVD) is a disease of the blood vessels that are not part of your heart and brain. A simple term for PVD is poor circulation. In most cases, PVD narrows the blood vessels that carry blood from your heart to the rest of your body. This can result in a decreased supply of blood to your arms, legs, and internal organs, like your stomach or kidneys. However, it most often affects a person's lower legs and feet.  There are two types of PVD.  · Organic PVD. This is the more common type. It is caused by damage to the structure of blood vessels.  · Functional PVD. This is caused by conditions that make blood vessels contract and tighten (spasm).  Without treatment, PVD tends to get worse over time.  PVD can also lead to acute ischemic limb. This is when an arm or limb suddenly has trouble getting enough blood. This is a medical emergency.  CAUSES  Each type of PVD has many different causes. The most common cause of PVD is buildup of a fatty material (plaque) inside of your arteries (atherosclerosis). Small amounts of plaque can break off from the walls of the blood vessels and become lodged in a smaller artery. This blocks blood flow and can cause acute ischemic limb.  Other common causes of PVD include:  · Blood clots that form inside of blood vessels.  · Injuries to blood vessels.  · Diseases that cause inflammation of blood vessels or cause blood vessel spasms.  · Health behaviors and health history that increase your risk of developing PVD.  RISK FACTORS   You may have a greater risk of PVD if you:  · Have a family history of PVD.  · Have certain medical conditions, including:    High cholesterol.    Diabetes.    High blood pressure (hypertension).    Coronary heart disease.    Past problems with blood clots.    Past injury, such as burns or a broken bone. These may have damaged blood vessels in your limbs.    Buerger disease. This is caused by inflamed blood  vessels in your hands and feet.    Some forms of arthritis.    Rare birth defects that affect the arteries in your legs.  · Use tobacco.  · Do not get enough exercise.  · Are obese.  · Are age 50 or older.  SIGNS AND SYMPTOMS   PVD may cause many different symptoms. Your symptoms depend on what part of your body is not getting enough blood. Some common signs and symptoms include:  · Cramps in your lower legs. This may be a symptom of poor leg circulation (claudication).  · Pain and weakness in your legs while you are physically active that goes away when you rest (intermittent claudication).  · Leg pain when at rest.  · Leg numbness, tingling, or weakness.  · Coldness in a leg or foot, especially when compared with the other leg.  · Skin or hair changes. These can include:    Hair loss.    Shiny skin.    Pale or bluish skin.    Thick toenails.  · Inability to get or maintain an erection (erectile dysfunction).  People with PVD are more prone to developing ulcers and sores on their toes, feet, or legs. These may take longer than normal to heal.  DIAGNOSIS  Your health care provider may diagnose PVD from your signs and symptoms.   The health care provider will also do a physical exam. You may have tests to find out what is causing your PVD and determine its severity. Tests may include:  · Blood pressure recordings from your arms and legs and measurements of the strength of your pulses (pulse volume recordings).  · Imaging studies using sound waves to take pictures of the blood flow through your blood vessels (Doppler ultrasound).  · Injecting a dye into your blood vessels before having imaging studies using:    X-rays (angiogram or arteriogram).    Computer-generated X-rays (CT angiogram).    A powerful electromagnetic field and a computer (magnetic resonance angiogram or MRA).  TREATMENT  Treatment for PVD depends on the cause of your condition and the severity of your symptoms. It also depends on your age. Underlying  causes need to be treated and controlled. These include long-lasting (chronic) conditions, such as diabetes, high cholesterol, and high blood pressure. You may need to first try making lifestyle changes and taking medicines. Surgery may be needed if these do not work.  Lifestyle changes may include:  · Quitting smoking.  · Exercising regularly.  · Following a low-fat, low-cholesterol diet.  Medicines may include:  · Blood thinners to prevent blood clots.  · Medicines to improve blood flow.  · Medicines to improve your blood cholesterol levels.  Surgical procedures may include:  · A procedure that uses an inflated balloon to open a blocked artery and improve blood flow (angioplasty).  · A procedure to put in a tube (stent) to keep a blocked artery open (stent implant).  · Surgery to reroute blood flow around a blocked artery (peripheral bypass surgery).  · Surgery to remove dead tissue from an infected wound on the affected limb.  · Amputation. This is surgical removal of the affected limb. This may be necessary in cases of acute ischemic limb that are not improved through medical or surgical treatments.  HOME CARE INSTRUCTIONS  · Take medicines only as directed by your health care provider.  · Do not use any tobacco products, including cigarettes, chewing tobacco, or electronic cigarettes.  If you need help quitting, ask your health care provider.  · Lose weight if you are overweight, and maintain a healthy weight as directed by your health care provider.  · Eat a diet that is low in fat and cholesterol. If you need help, ask your health care provider.  · Exercise regularly. Ask your health care provider to suggest some good activities for you.  · Use compression stockings or other mechanical devices as directed by your health care provider.  · Take good care of your feet.    Wear comfortable shoes that fit well.    Check your feet often for any cuts or sores.  SEEK MEDICAL CARE IF:  · You have cramps in your legs  while walking.  · You have leg pain when you are at rest.  · You have coldness in a leg or foot.  · Your skin changes.  · You have erectile dysfunction.  · You have cuts or sores on your feet that are not healing.  SEEK IMMEDIATE MEDICAL CARE IF:  · Your arm or leg turns cold and blue.  · Your arms or legs become red, warm, swollen, painful, or numb.  · You have chest pain or trouble breathing.  · You suddenly have weakness in your face, arm, or leg.  · You become very confused or lose the ability to speak.  ·   You suddenly have a very bad headache or lose your vision.     This information is not intended to replace advice given to you by your health care provider. Make sure you discuss any questions you have with your health care provider.     Document Released: 12/03/2004 Document Revised: 11/16/2014 Document Reviewed: 04/05/2014  Elsevier Interactive Patient Education ©2016 [employer]    Venous Stasis or Chronic Venous Insufficiency  Chronic venous insufficiency, also called venous stasis, is a condition that affects the veins in the legs. The condition prevents blood from being pumped through these veins effectively. Blood may no longer be pumped effectively from the legs back to the heart. This condition can range from mild to severe. With proper treatment, you should be able to continue with an active life.  CAUSES   Chronic venous insufficiency occurs when the vein walls become stretched, weakened, or damaged or when valves within the vein are damaged. Some common causes of this include:  · High blood pressure inside the veins (venous hypertension).  · Increased blood pressure in the leg veins from long periods of sitting or standing.  · A blood clot that blocks blood flow in a vein (deep vein thrombosis).  · Inflammation of a superficial vein (phlebitis) that causes a blood clot to form.  RISK FACTORS  Various things can make you more likely to develop chronic venous insufficiency, including:  · Family  history of this condition.  · Obesity.  · Pregnancy.  · Sedentary lifestyle.  · Smoking.  · Jobs requiring long periods of standing or sitting in one place.  · Being a certain age. Women in their 40s and 50s and men in their 70s are more likely to develop this condition.  SIGNS AND SYMPTOMS   Symptoms may include:   · Varicose veins.  · Skin breakdown or ulcers.  · Reddened or discolored skin on the leg.  · Brown, smooth, tight, and painful skin just above the ankle, usually on the inside surface (lipodermatosclerosis).  · Swelling.  DIAGNOSIS   To diagnose this condition, your health care provider will take a medical history and do a physical exam. The following tests may be ordered to confirm the diagnosis:  · Duplex ultrasound--A procedure that produces a picture of a blood vessel and nearby organs and also provides information on blood flow through the blood vessel.  · Plethysmography--A procedure that tests blood flow.  · A venogram, or venography--A procedure used to look at the veins using X-ray and dye.  TREATMENT  The goals of treatment are to help you return to an active life and to minimize pain or disability. Treatment will depend on the severity of the condition. Medical procedures may be needed for severe cases. Treatment options may include:   · Use of compression stockings. These can help with symptoms and lower the chances of the problem getting worse, but they do not cure the problem.  · Sclerotherapy--A procedure involving an injection of a material that "dissolves" the damaged veins. Other veins in the network of blood vessels take over the function of the damaged veins.  · Surgery to remove the vein or cut off blood flow through the vein (vein stripping or laser ablation surgery).  · Surgery to repair a valve.  HOME CARE INSTRUCTIONS   · Wear compression stockings as directed by your health care provider.  · Only take over-the-counter or prescription medicines for pain, discomfort, or fever as  directed by your health   care provider.  · Follow up with your health care provider as directed.  SEEK MEDICAL CARE IF:   · You have redness, swelling, or increasing pain in the affected area.  · You see a red streak or line that extends up or down from the affected area.  · You have a breakdown or loss of skin in the affected area, even if the breakdown is small.  · You have an injury to the affected area.  SEEK IMMEDIATE MEDICAL CARE IF:   · You have an injury and open wound in the affected area.  · Your pain is severe and does not improve with medicine.  · You have sudden numbness or weakness in the foot or ankle below the affected area, or you have trouble moving your foot or ankle.  · You have a fever or persistent symptoms for more than 2-3 days.  · You have a fever and your symptoms suddenly get worse.  MAKE SURE YOU:   · Understand these instructions.  · Will watch your condition.  · Will get help right away if you are not doing well or get worse.     This information is not intended to replace advice given to you by your health care provider. Make sure you discuss any questions you have with your health care provider.     Document Released: 03/01/2007 Document Revised: 08/16/2013 Document Reviewed: 07/03/2013  Elsevier Interactive Patient Education ©2016 [employer]

## 2016-01-27 ENCOUNTER — Ambulatory Visit (INDEPENDENT_AMBULATORY_CARE_PROVIDER_SITE_OTHER): Payer: Medicare Other | Admitting: Ophthalmology

## 2016-01-27 DIAGNOSIS — I10 Essential (primary) hypertension: Secondary | ICD-10-CM | POA: Diagnosis not present

## 2016-01-27 DIAGNOSIS — E113212 Type 2 diabetes mellitus with mild nonproliferative diabetic retinopathy with macular edema, left eye: Secondary | ICD-10-CM | POA: Diagnosis not present

## 2016-01-27 DIAGNOSIS — H35033 Hypertensive retinopathy, bilateral: Secondary | ICD-10-CM | POA: Diagnosis not present

## 2016-01-27 DIAGNOSIS — E11311 Type 2 diabetes mellitus with unspecified diabetic retinopathy with macular edema: Secondary | ICD-10-CM | POA: Diagnosis not present

## 2016-01-27 DIAGNOSIS — H43813 Vitreous degeneration, bilateral: Secondary | ICD-10-CM | POA: Diagnosis not present

## 2016-01-27 DIAGNOSIS — E113291 Type 2 diabetes mellitus with mild nonproliferative diabetic retinopathy without macular edema, right eye: Secondary | ICD-10-CM | POA: Diagnosis not present

## 2016-01-27 DIAGNOSIS — H26492 Other secondary cataract, left eye: Secondary | ICD-10-CM

## 2016-01-27 DIAGNOSIS — H353221 Exudative age-related macular degeneration, left eye, with active choroidal neovascularization: Secondary | ICD-10-CM

## 2016-02-27 ENCOUNTER — Encounter (INDEPENDENT_AMBULATORY_CARE_PROVIDER_SITE_OTHER): Payer: Medicare Other | Admitting: Ophthalmology

## 2016-02-27 DIAGNOSIS — H2702 Aphakia, left eye: Secondary | ICD-10-CM

## 2016-03-23 ENCOUNTER — Ambulatory Visit (INDEPENDENT_AMBULATORY_CARE_PROVIDER_SITE_OTHER): Payer: Medicare Other | Admitting: Emergency Medicine

## 2016-03-23 ENCOUNTER — Encounter: Payer: Self-pay | Admitting: Emergency Medicine

## 2016-03-23 VITALS — BP 108/62 | HR 70 | Ht 66.0 in | Wt 165.0 lb

## 2016-03-23 DIAGNOSIS — J449 Chronic obstructive pulmonary disease, unspecified: Secondary | ICD-10-CM | POA: Diagnosis not present

## 2016-03-23 DIAGNOSIS — R05 Cough: Secondary | ICD-10-CM

## 2016-03-23 DIAGNOSIS — R059 Cough, unspecified: Secondary | ICD-10-CM

## 2016-03-23 NOTE — Assessment & Plan Note (Signed)
Take loratadine 10 mg as needed for allergy symptoms

## 2016-03-23 NOTE — Patient Instructions (Signed)
Please continue to keep DuoNeb available to use as needed Take loratadine 10 mg as needed for allergy symptoms Follow with Dr Lamonte Sakai in 6 months or sooner if you have any problems

## 2016-03-23 NOTE — Progress Notes (Signed)
Subjective:    Patient ID: Dylan Knight, male    DOB: [birth year], 82 y.o.   MRN: [id number]  HPI 82 yo former smoker, 60+ pk-yrs, hx DM, PVD (fem bypass), AAA with stenting, HTN, CVA '05. Was dx with COPD in 12/13 when he was admitted to [facility] for CAP and a flare. He was discharged on Advair and combivent, but he didn't stay on these - poor coordination taking HFA's. He is typically able to exert himself without stopping. Has a daily frequent cough and has had this for over 20 yrs. Productive of white mucous. He doesn't hear wheezing. He has had many episodes of PNA vs AE, averages about once a year. Has been admitted 3 times in the past. Not on any inhaled medications right now, feels at baseline.   ROV 12/13/12 -- follow up for COPD and cough, frequent exacerbations. Had PFT today >> moderately severe AFL, no BD response, normal volumes, decreased DLCO that corrects for Va.  Feels that he has been doing well since last time. Coughs occasionally during the day. The cough is better at night on the tessalon. No clear heartburn sx. Not currently on BD's - difficulty doing them due to coordination. He is taking mucinex-D. No real nasal or head congestion.   ROV 01/27/13 -- COPD with mod severe AFL. He had a URI 2 weeks ago, caught from her wife. He is still having green phlegm. We started Duonebs last time > he didn't like them, didn't fell a benefit. He did them for 5-6 days and then stopped. He remains on mucinex. Benefited from [place].   ROV 04/18/13 -- COPD with mod severe AFL.  He is using DuoNebs bid, still coughs some phlegm but much better than last time.   ROV 11/15/13 -- COPD, moderately severe AFL.  He was unfortunately admitted to [facility] for AE-COPD and PNA, treated with abx, steroids. Happens about once a year, usually in the Fall. He is using duonebs less > about qd instead of bid (he knows he could be using 4x a day). He is active. Able to exert. Occasional cough, minimal cough.    ROV 03/10/14 -- COPD, moderately severe AFL.  They have sold their house and have downsized. No more hospitalizations, no flares since last visit.  He is using duonebs about 2x a week, our plans had been to use qid. Not sure he misses it. Minimal cough or wheeze. Taking loratadine reliably.   ROV 09/26/14 -- follow up visit for COPD. He has been managed on albuterol / atrovent BID. He is uncertain that the medication helps him. He unfortunately fell in July and hurt R leg.  He remains able to get around, he no longer drives. He has recently noticed throat hoarseness. Has clear / yellow nasal drainage. No fever but he has had thick yellow sputum.   ROV 07/16/15 -- follow-up visit for obstructive lung disease and COPD. He is currently using Duonebs, but not reliably. He is not clear that it helps him, so he neglects to use it. It may be decreasing his cough some according to his wife.    ROV 03/23/16 -- 73 yo man with hx COPD. Feels that he has been doing fairly well - denies any dyspnea, able to get around, uses a walker for balance.  He has duoneb available to use prn. On loratadine prn. He has minimal cough     Objective:   Physical Exam  Filed Vitals:   03/23/16 1021  BP:  108/62  Pulse: 70  Height: 5\' 6"  (1.676 m)  Weight: 165 lb (74.844 kg)  SpO2: 96%   Gen: Pleasant, elderly man, in no distress,  normal affect  ENT: No lesions,  mouth clear,  oropharynx clear, no postnasal drip  Neck: No JVD, no TMG, no carotid bruits  Lungs: No use of accessory muscles, distant, clear without rales or rhonchi  Cardiovascular: RRR, heart sounds normal, no murmur or gallops, no peripheral edema  Musculoskeletal: No deformities, no cyanosis or clubbing  Neuro: alert, non focal  Skin: Warm, no lesions or rash     Assessment & Plan:  COPD (chronic obstructive pulmonary disease) Please continue to keep DuoNeb available to use as needed Follow with Dr Lamonte Sakai in 6 months or sooner if you have any  problems  Cough Take loratadine 10 mg as needed for allergy symptoms

## 2016-03-23 NOTE — Assessment & Plan Note (Signed)
Please continue to keep DuoNeb available to use as needed Follow with Dr Lamonte Sakai in 6 months or sooner if you have any problems

## 2016-03-25 ENCOUNTER — Encounter: Payer: Self-pay | Admitting: Family

## 2016-04-01 ENCOUNTER — Ambulatory Visit (INDEPENDENT_AMBULATORY_CARE_PROVIDER_SITE_OTHER)
Admission: RE | Admit: 2016-04-01 | Discharge: 2016-04-01 | Disposition: A | Discharge status: Home / Self Care | Payer: Medicare Other | Source: Ambulatory Visit | Attending: Family | Admitting: Family

## 2016-04-01 ENCOUNTER — Encounter: Payer: Self-pay | Admitting: Family

## 2016-04-01 ENCOUNTER — Ambulatory Visit (HOSPITAL_COMMUNITY)
Admission: RE | Admit: 2016-04-01 | Discharge: 2016-04-01 | Disposition: A | Discharge status: Home / Self Care | Payer: Medicare Other | Source: Ambulatory Visit | Attending: Family | Admitting: Family

## 2016-04-01 ENCOUNTER — Ambulatory Visit (INDEPENDENT_AMBULATORY_CARE_PROVIDER_SITE_OTHER): Payer: Medicare Other | Admitting: Family

## 2016-04-01 VITALS — BP 135/70 | HR 82 | Temp 96.8°F | Resp 16 | Ht 66.0 in | Wt 164.0 lb

## 2016-04-01 DIAGNOSIS — Z9889 Other specified postprocedural states: Secondary | ICD-10-CM

## 2016-04-01 DIAGNOSIS — Z87891 Personal history of nicotine dependence: Secondary | ICD-10-CM | POA: Insufficient documentation

## 2016-04-01 DIAGNOSIS — Z48812 Encounter for surgical aftercare following surgery on the circulatory system: Secondary | ICD-10-CM | POA: Insufficient documentation

## 2016-04-01 DIAGNOSIS — I779 Disorder of arteries and arterioles, unspecified: Secondary | ICD-10-CM

## 2016-04-01 DIAGNOSIS — Z959 Presence of cardiac and vascular implant and graft, unspecified: Secondary | ICD-10-CM

## 2016-04-01 DIAGNOSIS — Z95828 Presence of other vascular implants and grafts: Secondary | ICD-10-CM

## 2016-04-01 DIAGNOSIS — I6523 Occlusion and stenosis of bilateral carotid arteries: Secondary | ICD-10-CM

## 2016-04-01 DIAGNOSIS — Z4889 Encounter for other specified surgical aftercare: Secondary | ICD-10-CM

## 2016-04-01 DIAGNOSIS — I872 Venous insufficiency (chronic) (peripheral): Secondary | ICD-10-CM

## 2016-04-01 DIAGNOSIS — I6522 Occlusion and stenosis of left carotid artery: Secondary | ICD-10-CM | POA: Diagnosis not present

## 2016-04-01 NOTE — Patient Instructions (Signed)
Peripheral Vascular Disease Peripheral vascular disease (PVD) is a disease of the blood vessels that are not part of your heart and brain. A simple term for PVD is poor circulation. In most cases, PVD narrows the blood vessels that carry blood from your heart to the rest of your body. This can result in a decreased supply of blood to your arms, legs, and internal organs, like your stomach or kidneys. However, it most often affects a person's lower legs and feet. There are two types of PVD.  Organic PVD. This is the more common type. It is caused by damage to the structure of blood vessels.  Functional PVD. This is caused by conditions that make blood vessels contract and tighten (spasm). Without treatment, PVD tends to get worse over time. PVD can also lead to acute ischemic limb. This is when an arm or limb suddenly has trouble getting enough blood. This is a medical emergency. CAUSES Each type of PVD has many different causes. The most common cause of PVD is buildup of a fatty material (plaque) inside of your arteries (atherosclerosis). Small amounts of plaque can break off from the walls of the blood vessels and become lodged in a smaller artery. This blocks blood flow and can cause acute ischemic limb. Other common causes of PVD include:  Blood clots that form inside of blood vessels.  Injuries to blood vessels.  Diseases that cause inflammation of blood vessels or cause blood vessel spasms.  Health behaviors and health history that increase your risk of developing PVD. RISK FACTORS  You may have a greater risk of PVD if you:  Have a family history of PVD.  Have certain medical conditions, including:  High cholesterol.  Diabetes.  High blood pressure (hypertension).  Coronary heart disease.  Past problems with blood clots.  Past injury, such as burns or a broken bone. These may have damaged blood vessels in your limbs.  Buerger disease. This is caused by inflamed blood  vessels in your hands and feet.  Some forms of arthritis.  Rare birth defects that affect the arteries in your legs.  Use tobacco.  Do not get enough exercise.  Are obese.  Are age 50 or older. SIGNS AND SYMPTOMS  PVD may cause many different symptoms. Your symptoms depend on what part of your body is not getting enough blood. Some common signs and symptoms include:  Cramps in your lower legs. This may be a symptom of poor leg circulation (claudication).  Pain and weakness in your legs while you are physically active that goes away when you rest (intermittent claudication).  Leg pain when at rest.  Leg numbness, tingling, or weakness.  Coldness in a leg or foot, especially when compared with the other leg.  Skin or hair changes. These can include:  Hair loss.  Shiny skin.  Pale or bluish skin.  Thick toenails.  Inability to get or maintain an erection (erectile dysfunction). People with PVD are more prone to developing ulcers and sores on their toes, feet, or legs. These may take longer than normal to heal. DIAGNOSIS Your health care provider may diagnose PVD from your signs and symptoms. The health care provider will also do a physical exam. You may have tests to find out what is causing your PVD and determine its severity. Tests may include:  Blood pressure recordings from your arms and legs and measurements of the strength of your pulses (pulse volume recordings).  Imaging studies using sound waves to take pictures of   the blood flow through your blood vessels (Doppler ultrasound).  Injecting a dye into your blood vessels before having imaging studies using:  X-rays (angiogram or arteriogram).  Computer-generated X-rays (CT angiogram).  A powerful electromagnetic field and a computer (magnetic resonance angiogram or MRA). TREATMENT Treatment for PVD depends on the cause of your condition and the severity of your symptoms. It also depends on your age. Underlying  causes need to be treated and controlled. These include long-lasting (chronic) conditions, such as diabetes, high cholesterol, and high blood pressure. You may need to first try making lifestyle changes and taking medicines. Surgery may be needed if these do not work. Lifestyle changes may include:  Quitting smoking.  Exercising regularly.  Following a low-fat, low-cholesterol diet. Medicines may include:  Blood thinners to prevent blood clots.  Medicines to improve blood flow.  Medicines to improve your blood cholesterol levels. Surgical procedures may include:  A procedure that uses an inflated balloon to open a blocked artery and improve blood flow (angioplasty).  A procedure to put in a tube (stent) to keep a blocked artery open (stent implant).  Surgery to reroute blood flow around a blocked artery (peripheral bypass surgery).  Surgery to remove dead tissue from an infected wound on the affected limb.  Amputation. This is surgical removal of the affected limb. This may be necessary in cases of acute ischemic limb that are not improved through medical or surgical treatments. HOME CARE INSTRUCTIONS  Take medicines only as directed by your health care provider.  Do not use any tobacco products, including cigarettes, chewing tobacco, or electronic cigarettes. If you need help quitting, ask your health care provider.  Lose weight if you are overweight, and maintain a healthy weight as directed by your health care provider.  Eat a diet that is low in fat and cholesterol. If you need help, ask your health care provider.  Exercise regularly. Ask your health care provider to suggest some good activities for you.  Use compression stockings or other mechanical devices as directed by your health care provider.  Take good care of your feet.  Wear comfortable shoes that fit well.  Check your feet often for any cuts or sores. SEEK MEDICAL CARE IF:  You have cramps in your legs  while walking.  You have leg pain when you are at rest.  You have coldness in a leg or foot.  Your skin changes.  You have erectile dysfunction.  You have cuts or sores on your feet that are not healing. SEEK IMMEDIATE MEDICAL CARE IF:  Your arm or leg turns cold and blue.  Your arms or legs become red, warm, swollen, painful, or numb.  You have chest pain or trouble breathing.  You suddenly have weakness in your face, arm, or leg.  You become very confused or lose the ability to speak.  You suddenly have a very bad headache or lose your vision.   This information is not intended to replace advice given to you by your health care provider. Make sure you discuss any questions you have with your health care provider.   Document Released: 12/03/2004 Document Revised: 11/16/2014 Document Reviewed: 04/05/2014 Elsevier Interactive Patient Education 2016 Elsevier Inc[employer]    Stroke Prevention Some medical conditions and behaviors are associated with an increased chance of having a stroke. You may prevent a stroke by making healthy choices and managing medical conditions. HOW CAN I REDUCE MY RISK OF HAVING A STROKE?   Stay physically active. Get at   least 30 minutes of activity on most or all days.  Do not smoke. It may also be helpful to avoid exposure to secondhand smoke.  Limit alcohol use. Moderate alcohol use is considered to be:  No more than 2 drinks per day for men.  No more than 1 drink per day for nonpregnant women.  Eat healthy foods. This involves:  Eating 5 or more servings of fruits and vegetables a day.  Making dietary changes that address high blood pressure (hypertension), high cholesterol, diabetes, or obesity.  Manage your cholesterol levels.  Making food choices that are high in fiber and low in saturated fat, trans fat, and cholesterol may control cholesterol levels.  Take any prescribed medicines to control cholesterol as directed by your health care  provider.  Manage your diabetes.  Controlling your carbohydrate and sugar intake is recommended to manage diabetes.  Take any prescribed medicines to control diabetes as directed by your health care provider.  Control your hypertension.  Making food choices that are low in salt (sodium), saturated fat, trans fat, and cholesterol is recommended to manage hypertension.  Ask your health care provider if you need treatment to lower your blood pressure. Take any prescribed medicines to control hypertension as directed by your health care provider.  If you are 18-39 years of age, have your blood pressure checked every 3-5 years. If you are 40 years of age or older, have your blood pressure checked every year.  Maintain a healthy weight.  Reducing calorie intake and making food choices that are low in sodium, saturated fat, trans fat, and cholesterol are recommended to manage weight.  Stop drug abuse.  Avoid taking birth control pills.  Talk to your health care provider about the risks of taking birth control pills if you are over 35 years old, smoke, get migraines, or have ever had a blood clot.  Get evaluated for sleep disorders (sleep apnea).  Talk to your health care provider about getting a sleep evaluation if you snore a lot or have excessive sleepiness.  Take medicines only as directed by your health care provider.  For some people, aspirin or blood thinners (anticoagulants) are helpful in reducing the risk of forming abnormal blood clots that can lead to stroke. If you have the irregular heart rhythm of atrial fibrillation, you should be on a blood thinner unless there is a good reason you cannot take them.  Understand all your medicine instructions.  Make sure that other conditions (such as anemia or atherosclerosis) are addressed. SEEK IMMEDIATE MEDICAL CARE IF:   You have sudden weakness or numbness of the face, arm, or leg, especially on one side of the body.  Your face  or eyelid droops to one side.  You have sudden confusion.  You have trouble speaking (aphasia) or understanding.  You have sudden trouble seeing in one or both eyes.  You have sudden trouble walking.  You have dizziness.  You have a loss of balance or coordination.  You have a sudden, severe headache with no known cause.  You have new chest pain or an irregular heartbeat. Any of these symptoms may represent a serious problem that is an emergency. Do not wait to see if the symptoms will go away. Get medical help at once. Call your local emergency services (911 in [place] Do not drive yourself to the hospital.   This information is not intended to replace advice given to you by your health care provider. Make sure you discuss any questions   you have with your health care provider.   Document Released: 12/03/2004 Document Revised: 11/16/2014 Document Reviewed: 04/28/2013 Elsevier Interactive Patient Education 2016 Elsevier Inc.  

## 2016-04-01 NOTE — Progress Notes (Signed)
[facility] [place] HISTORY AND PHYSICAL   MRN : [id number]  History of Present Illness:   Dylan Knight is a 82 y.o. male patient of Dr. Scot Dock who has had a previous right carotid endarterectomy in 2007. The patient has also had a left internal carotid artery stent.  This patient has also undergone previous repair of an abdominal aortic aneurysm in 2003. Also has a right femoropopliteal bypass graft which has been revised in 2005.  He complains of pain in right hip only after walking about 1/2 block, relieved by rest.  He denies ulcers or slow healing wounds.  His worst complaint seems to be his low back and hip pain, he received back injections which did not help.  Since he has been elevating his legs above his heart several times/day, the swelling in his legs has decreased, and pain in both hips has decreased.  He denies claudication sx's in his legs with walking; states he is walking 20-30 minutes twice daily.   He sees Dr. Lamonte Sakai for COPD.  Patient has Negative history of TIA or stroke symptom since 2005 at which time he had right hemiparesis, he is yet unable to write very well with his right hand; no subsequent strokes or TIA. The patient denies amaurosis fugax or monocular blindness. The patient denies unilateral facial drooping. The patient denies receptive or expressive aphasia.   Pt Diabetic: Yes, states in good control Pt smoker: quit in 2005, smoked for 63 years  Pt meds include:  Statin : Yes  Betablocker: Yes  ASA: No:  Other anticoagulants/antiplatelets: Plavix    Current Outpatient Prescriptions  Medication Sig Dispense Refill  . albuterol (PROVENTIL HFA;VENTOLIN HFA) 108 (90 BASE) MCG/ACT inhaler Inhale 2 puffs into the lungs every 6 (six) hours as needed for wheezing or shortness of breath. 1 Inhaler 6  . atenolol (TENORMIN) 25 MG tablet Take 25 mg by mouth daily.    Marland Kitchen atenolol-chlorthalidone (TENORETIC) 50-25 MG per tablet  daily.    . benzonatate (TESSALON) 100 MG capsule Take 2 capsules (200 mg total) by mouth 3 (three) times daily as needed for cough. 30 capsule 5  . clopidogrel (PLAVIX) 75 MG tablet Take 75 mg by mouth daily.    Marland Kitchen ezetimibe-simvastatin (VYTORIN) 10-40 MG per tablet Take 1 tablet by mouth at bedtime.    Marland Kitchen ipratropium (ATROVENT) 0.02 % nebulizer solution Take 2.5 mLs (500 mcg total) by nebulization 4 (four) times daily. 75 mL 6  . ipratropium-albuterol (DUONEB) 0.5-2.5 (3) MG/3ML SOLN Take 3 mLs by nebulization 2 (two) times daily. 360 mL 5  . levothyroxine (SYNTHROID, LEVOTHROID) 25 MCG tablet 1.5 tablets every morning. Take one and one half tablet every morning 30 min. Before breakfast.    . loratadine (CLARITIN) 10 MG tablet Take 10 mg by mouth daily. Reported on 01/03/2016    . Multiple Vitamins-Minerals (CENTRUM SILVER PO) Take 1 tablet by mouth daily.    Marland Kitchen propylthiouracil (PTU) 50 MG tablet Take 100 mg by mouth every morning.    . sitaGLIPtan-metformin (JANUMET) 50-500 MG per tablet Take 1 tablet by mouth 2 (two) times daily with a meal.     No current facility-administered medications for this visit.    Past Medical History  Diagnosis Date  . Hypertension   . Peripheral vascular disease ([place])   . Hyperlipidemia   . BPH (benign prostatic hyperplasia)   . COPD (chronic obstructive pulmonary disease) ([place])   . Peripheral arterial disease ([place]) 2002  .  Stroke ([place]) 2005  . Diabetes mellitus 2011    Border line  . Macular degeneration   . Carotid artery occlusion     Social History Social History  Substance Use Topics  . Smoking status: Former Smoker -- 1.00 packs/day for 63 years    Types: Cigarettes    Quit date: 04/09/2004  . Smokeless tobacco: Never Used  . Alcohol Use: No    Family History Family History  Problem Relation Age of Onset  . Stroke Mother   . Hypertension Mother   . Heart disease Father   . Heart attack Father   . Cancer Brother   . Hypertension  Brother     Surgical History Past Surgical History  Procedure Laterality Date  . Spine surgery  1960    lumbar spine  . Spine surgery  1980, 2007    cervical spine  . Abdominal aortic aneurysm repair  2003    Aorto-left femoral-right iliac BPG by Dr. Scot Dock  . Toe amputation  08-2001    right 5th toe amputation  . Carotid endarterectomy  Jan. 25,2007    Right CEA  . Pr vein bypass graft,aorto-fem-pop  2005    Revision Right Fem-pop  . Pr vein bypass graft,aorto-fem-pop  2002    Right Fem-pop    Allergies  Allergen Reactions  . Iohexol Hives    Went away with benadryl     Current Outpatient Prescriptions  Medication Sig Dispense Refill  . albuterol (PROVENTIL HFA;VENTOLIN HFA) 108 (90 BASE) MCG/ACT inhaler Inhale 2 puffs into the lungs every 6 (six) hours as needed for wheezing or shortness of breath. 1 Inhaler 6  . atenolol (TENORMIN) 25 MG tablet Take 25 mg by mouth daily.    Marland Kitchen atenolol-chlorthalidone (TENORETIC) 50-25 MG per tablet daily.    . benzonatate (TESSALON) 100 MG capsule Take 2 capsules (200 mg total) by mouth 3 (three) times daily as needed for cough. 30 capsule 5  . clopidogrel (PLAVIX) 75 MG tablet Take 75 mg by mouth daily.    Marland Kitchen ezetimibe-simvastatin (VYTORIN) 10-40 MG per tablet Take 1 tablet by mouth at bedtime.    Marland Kitchen ipratropium (ATROVENT) 0.02 % nebulizer solution Take 2.5 mLs (500 mcg total) by nebulization 4 (four) times daily. 75 mL 6  . ipratropium-albuterol (DUONEB) 0.5-2.5 (3) MG/3ML SOLN Take 3 mLs by nebulization 2 (two) times daily. 360 mL 5  . levothyroxine (SYNTHROID, LEVOTHROID) 25 MCG tablet 1.5 tablets every morning. Take one and one half tablet every morning 30 min. Before breakfast.    . loratadine (CLARITIN) 10 MG tablet Take 10 mg by mouth daily. Reported on 01/03/2016    . Multiple Vitamins-Minerals (CENTRUM SILVER PO) Take 1 tablet by mouth daily.    Marland Kitchen propylthiouracil (PTU) 50 MG tablet Take 100 mg by mouth every morning.    .  sitaGLIPtan-metformin (JANUMET) 50-500 MG per tablet Take 1 tablet by mouth 2 (two) times daily with a meal.     No current facility-administered medications for this visit.     REVIEW OF SYSTEMS: See HPI for pertinent positives and negatives.  Physical Examination Filed Vitals:   04/01/16 1122 04/01/16 1123  BP: 134/78 135/70  Pulse: 78 82  Temp: 96.8 F (36 C)   Resp: 16   Height: 5\' 6"  (1.676 m)   Weight: 164 lb (74.39 kg)   SpO2: 96%    Body mass index is 26.48 kg/(m^2).  General: WDWN male in NAD  GAIT: antalgic,  Eyes: PERRLA  Pulmonary:  limited air movement in all fields, no rales, no rhonchi & no wheezing,  Cardiac: regular rhythm, no detected murmur  Vascular:  Vessel  Right  Left   Radial  2+Palpable  2+Palpable   Carotid  Audible, without bruit  Audible, without bruit   Aorta  Not palpable  N/A   Femoral  2+ Palpable  2+Palpable   Popliteal  Not palpable  Not palpable   PT  Not Palpable  Not Palpable   DP  faintly Palpable  not Palpable    Gastrointestinal: soft, nontender, BS WNL, no r/g, no palpable masses.  Musculoskeletal: No muscle atrophy/wasting. M/S 5/5 throughout except 4.5/5 in right upper extremity, Extremities without ischemic changes. Right 5th toe is surgically absent. 1-2+ pitting and non pitting edema in both lower legs.  Neurologic: A&O X 3; Appropriate Affect,  Speech is normal  CN 2-12 intact except is hard of hearing, Pain and light touch intact in extremities , Motor exam as listed above                     Non-Invasive Vascular Imaging (04/01/2016):   Carotid Duplex: Patent right CEA site with no evidence of hyperplasia or restenosis. Patent left ICA stent with <40% stenosis. No significant change compared to last exam.  Right  LE Arterial Duplex: Right LE bypass with no evidence of restenosis within the graft; however, there are elevated  velocities in the inflow segment (259 cm/s). ABI's appear improved; but may be falsely elevated due to calcified vessels.   ABI's: Right : 1.02 (0.94, 01/03/16), Waveforms: PT: not detected, DP: monophasic, TBI: 0.74 (0.49) Left: 0.66 (0.49), Waveforms: monophasic PT and DP, TBI: 0.57 (0.39)    ASSESSMENT:  Dylan Knight is a 82 y.o. male who is s/p right carotid endarterectomy in 2007 and a left internal carotid artery stent placed.  He has also undergone previous repair of an abdominal aortic aneurysm in 2003. Also has a right femoropopliteal bypass graft which has been revised in 2005.  He has had no stroke or TIA activity since 2005. He has no claudication sx's with walking and walks 20-30 minutes twice/day; he has no signs of ischemia in his feet/legs. Chronic venous insufficiency: The pitting edema in his lower legs has improved by using an ergonomically designed pillow for his legs several times/day and sleeping in his bed instead of his recliner (legs are better elevated overnight).  Carotid duplex today suggests a patent right CEA site with no evidence of hyperplasia or restenosis. Patent left ICA stent with <40% stenosis. No significant change compared to last exam.  Today's right LE arterial duplex suggests right LE bypass with no evidence of restenosis within the graft; however, there are elevated velocities in the inflow segment (259 cm/s). ABI's appear improved; but may be falsely elevated due to calcified vessels; however, both TBI's have improved.    PLAN:   Continue graduated walking program.  Based on today's exam and non-invasive vascular lab results, the patient will follow up in 6 months with ABI's, and right LE arterial Duplex; carotid duplex in a year.  I discussed in depth with the patient the nature of atherosclerosis, and emphasized the importance of maximal medical management including strict control of blood pressure, blood glucose, and lipid levels,  obtaining regular exercise, and cessation of smoking.  The patient is aware that without maximal medical management the underlying atherosclerotic disease process will progress, limiting the benefit of any interventions.  The patient was given  information about stroke prevention and what symptoms should prompt the patient to seek immediate medical care.  The patient was given information about PAD including signs, symptoms, treatment, what symptoms should prompt the patient to seek immediate medical care, and risk reduction measures to take. Thank you for allowing [place] to participate in this patient's care.  Clemon Chambers, RN, MSN, FNP-C [facility] Office: [phone]  Clinic MD: Scot Dock 04/01/2016 11:32 AM

## 2016-05-14 ENCOUNTER — Other Ambulatory Visit: Payer: Self-pay | Admitting: Orthopedic Surgery

## 2016-05-21 ENCOUNTER — Encounter (HOSPITAL_BASED_OUTPATIENT_CLINIC_OR_DEPARTMENT_OTHER): Payer: Self-pay | Admitting: Anesthesiology

## 2016-05-21 ENCOUNTER — Encounter (HOSPITAL_BASED_OUTPATIENT_CLINIC_OR_DEPARTMENT_OTHER): Payer: Self-pay | Admitting: *Deleted

## 2016-05-21 NOTE — Progress Notes (Addendum)
Bring all medications. Requested EKG and Labs from Dr. Glendale Chard office. Requested notes from Dr. Julious Payer office - post stroke. Dr. Jillyn Hidden reviewed ekg, last office notes and labs from [facility] - ok for surgery.

## 2016-05-28 ENCOUNTER — Encounter: Payer: Self-pay | Admitting: Family

## 2016-06-01 ENCOUNTER — Encounter (HOSPITAL_BASED_OUTPATIENT_CLINIC_OR_DEPARTMENT_OTHER): Payer: Self-pay

## 2016-06-02 ENCOUNTER — Encounter (HOSPITAL_BASED_OUTPATIENT_CLINIC_OR_DEPARTMENT_OTHER)
Admission: RE | Disposition: A | Discharge status: Home / Self Care | Payer: Self-pay | Source: Ambulatory Visit | Attending: Orthopedic Surgery

## 2016-06-02 ENCOUNTER — Encounter (HOSPITAL_BASED_OUTPATIENT_CLINIC_OR_DEPARTMENT_OTHER): Payer: Self-pay | Admitting: Anesthesiology

## 2016-06-02 ENCOUNTER — Ambulatory Visit (HOSPITAL_BASED_OUTPATIENT_CLINIC_OR_DEPARTMENT_OTHER): Admission: RE | Admit: 2016-06-02 | Payer: Self-pay | Source: Ambulatory Visit | Admitting: Orthopedic Surgery

## 2016-06-02 ENCOUNTER — Ambulatory Visit (HOSPITAL_BASED_OUTPATIENT_CLINIC_OR_DEPARTMENT_OTHER): Payer: Medicare Other | Admitting: Anesthesiology

## 2016-06-02 ENCOUNTER — Ambulatory Visit (HOSPITAL_BASED_OUTPATIENT_CLINIC_OR_DEPARTMENT_OTHER)
Admission: RE | Admit: 2016-06-02 | Discharge: 2016-06-02 | Disposition: A | Discharge status: Home / Self Care | Payer: Medicare Other | Source: Ambulatory Visit | Attending: Orthopedic Surgery | Admitting: Orthopedic Surgery

## 2016-06-02 DIAGNOSIS — E039 Hypothyroidism, unspecified: Secondary | ICD-10-CM | POA: Diagnosis not present

## 2016-06-02 DIAGNOSIS — Z87891 Personal history of nicotine dependence: Secondary | ICD-10-CM | POA: Insufficient documentation

## 2016-06-02 DIAGNOSIS — Z7984 Long term (current) use of oral hypoglycemic drugs: Secondary | ICD-10-CM | POA: Insufficient documentation

## 2016-06-02 DIAGNOSIS — Z85828 Personal history of other malignant neoplasm of skin: Secondary | ICD-10-CM | POA: Diagnosis not present

## 2016-06-02 DIAGNOSIS — Z7902 Long term (current) use of antithrombotics/antiplatelets: Secondary | ICD-10-CM | POA: Diagnosis not present

## 2016-06-02 DIAGNOSIS — G5602 Carpal tunnel syndrome, left upper limb: Secondary | ICD-10-CM | POA: Insufficient documentation

## 2016-06-02 DIAGNOSIS — Z95820 Peripheral vascular angioplasty status with implants and grafts: Secondary | ICD-10-CM | POA: Insufficient documentation

## 2016-06-02 DIAGNOSIS — Z89421 Acquired absence of other right toe(s): Secondary | ICD-10-CM | POA: Diagnosis not present

## 2016-06-02 DIAGNOSIS — E1151 Type 2 diabetes mellitus with diabetic peripheral angiopathy without gangrene: Secondary | ICD-10-CM | POA: Diagnosis not present

## 2016-06-02 DIAGNOSIS — I10 Essential (primary) hypertension: Secondary | ICD-10-CM | POA: Diagnosis not present

## 2016-06-02 DIAGNOSIS — Z8673 Personal history of transient ischemic attack (TIA), and cerebral infarction without residual deficits: Secondary | ICD-10-CM | POA: Diagnosis not present

## 2016-06-02 DIAGNOSIS — J449 Chronic obstructive pulmonary disease, unspecified: Secondary | ICD-10-CM | POA: Diagnosis not present

## 2016-06-02 HISTORY — PX: CARPAL TUNNEL RELEASE: SHX101

## 2016-06-02 HISTORY — DX: Unspecified osteoarthritis, unspecified site: M19.90

## 2016-06-02 HISTORY — DX: Type 2 diabetes mellitus without complications: E11.9

## 2016-06-02 HISTORY — DX: Hypothyroidism, unspecified: E03.9

## 2016-06-02 HISTORY — DX: Malignant (primary) neoplasm, unspecified: C80.1

## 2016-06-02 LAB — GLUCOSE, CAPILLARY
Glucose-Capillary: 143 mg/dL — ABNORMAL HIGH (ref 65–99)
Glucose-Capillary: 157 mg/dL — ABNORMAL HIGH (ref 65–99)

## 2016-06-02 SURGERY — CARPAL TUNNEL RELEASE
Anesthesia: Regional | Laterality: Left

## 2016-06-02 SURGERY — CARPAL TUNNEL RELEASE
Anesthesia: Monitor Anesthesia Care | Site: Wrist | Laterality: Left

## 2016-06-02 MED ORDER — PROPOFOL 500 MG/50ML IV EMUL
INTRAVENOUS | Status: DC | PRN
  Administered 2016-06-02: 25 ug/kg/min via INTRAVENOUS

## 2016-06-02 MED ORDER — LIDOCAINE 2% (20 MG/ML) 5 ML SYRINGE
INTRAMUSCULAR | Status: AC
  Filled 2016-06-02: qty 5

## 2016-06-02 MED ORDER — LIDOCAINE HCL (PF) 0.5 % IJ SOLN
INTRAMUSCULAR | Status: AC
  Filled 2016-06-02: qty 50

## 2016-06-02 MED ORDER — TRAMADOL HCL 50 MG PO TABS: 50.0000 mg | ORAL_TABLET | Freq: Four times a day (QID) | ORAL | 0 refills | Status: DC | PRN

## 2016-06-02 MED ORDER — FENTANYL CITRATE (PF) 100 MCG/2ML IJ SOLN: 25.0000 ug | INTRAMUSCULAR | Status: DC | PRN

## 2016-06-02 MED ORDER — LIDOCAINE HCL (PF) 0.5 % IJ SOLN
INTRAMUSCULAR | Status: DC | PRN
  Administered 2016-06-02: 30 mL via INTRAVENOUS

## 2016-06-02 MED ORDER — FENTANYL CITRATE (PF) 100 MCG/2ML IJ SOLN
INTRAMUSCULAR | Status: AC
  Filled 2016-06-02: qty 2

## 2016-06-02 MED ORDER — FENTANYL CITRATE (PF) 100 MCG/2ML IJ SOLN
INTRAMUSCULAR | Status: DC | PRN
  Administered 2016-06-02: 100 ug via INTRAVENOUS

## 2016-06-02 MED ORDER — GLYCOPYRROLATE 0.2 MG/ML IJ SOLN: 0.2000 mg | Freq: Once | INTRAMUSCULAR | Status: DC | PRN

## 2016-06-02 MED ORDER — CEFAZOLIN SODIUM-DEXTROSE 2-4 GM/100ML-% IV SOLN
INTRAVENOUS | Status: AC
  Filled 2016-06-02: qty 100

## 2016-06-02 MED ORDER — MEPERIDINE HCL 25 MG/ML IJ SOLN: 6.2500 mg | INTRAMUSCULAR | Status: DC | PRN

## 2016-06-02 MED ORDER — BUPIVACAINE HCL (PF) 0.25 % IJ SOLN
INTRAMUSCULAR | Status: DC | PRN
  Administered 2016-06-02: 7 mL

## 2016-06-02 MED ORDER — CEFAZOLIN SODIUM-DEXTROSE 2-4 GM/100ML-% IV SOLN
2.0000 g | INTRAVENOUS | Status: AC
  Administered 2016-06-02: 2 g via INTRAVENOUS

## 2016-06-02 MED ORDER — CHLORHEXIDINE GLUCONATE 4 % EX LIQD: 60.0000 mL | Freq: Once | CUTANEOUS | Status: DC

## 2016-06-02 MED ORDER — PROPOFOL 500 MG/50ML IV EMUL
INTRAVENOUS | Status: AC
  Filled 2016-06-02: qty 50

## 2016-06-02 MED ORDER — PROPOFOL 10 MG/ML IV BOLUS
INTRAVENOUS | Status: AC
  Filled 2016-06-02: qty 40

## 2016-06-02 MED ORDER — LACTATED RINGERS IV SOLN
INTRAVENOUS | Status: DC
  Administered 2016-06-02: 11:00:00 via INTRAVENOUS

## 2016-06-02 MED ORDER — FENTANYL CITRATE (PF) 100 MCG/2ML IJ SOLN: 50.0000 ug | INTRAMUSCULAR | Status: DC | PRN

## 2016-06-02 MED ORDER — ONDANSETRON HCL 4 MG/2ML IJ SOLN
INTRAMUSCULAR | Status: DC | PRN
  Administered 2016-06-02: 4 mg via INTRAVENOUS

## 2016-06-02 MED ORDER — SCOPOLAMINE 1 MG/3DAYS TD PT72: 1.0000 | MEDICATED_PATCH | Freq: Once | TRANSDERMAL | Status: DC | PRN

## 2016-06-02 MED ORDER — MIDAZOLAM HCL 2 MG/2ML IJ SOLN: 1.0000 mg | INTRAMUSCULAR | Status: DC | PRN

## 2016-06-02 MED ORDER — ONDANSETRON HCL 4 MG/2ML IJ SOLN
INTRAMUSCULAR | Status: AC
  Filled 2016-06-02: qty 2

## 2016-06-02 SURGICAL SUPPLY — 36 items
BLADE SURG 15 STRL LF DISP TIS (BLADE) ×1 IMPLANT
BLADE SURG 15 STRL SS (BLADE) ×2
BNDG CMPR 9X4 STRL LF SNTH (GAUZE/BANDAGES/DRESSINGS) ×1
BNDG COHESIVE 3X5 TAN STRL LF (GAUZE/BANDAGES/DRESSINGS) ×2 IMPLANT
BNDG ESMARK 4X9 LF (GAUZE/BANDAGES/DRESSINGS) ×1 IMPLANT
BNDG GAUZE ELAST 4 BULKY (GAUZE/BANDAGES/DRESSINGS) ×2 IMPLANT
CHLORAPREP W/TINT 26ML (MISCELLANEOUS) ×2 IMPLANT
CORDS BIPOLAR (ELECTRODE) ×2 IMPLANT
COVER BACK TABLE 60X90IN (DRAPES) ×2 IMPLANT
COVER MAYO STAND STRL (DRAPES) ×2 IMPLANT
CUFF TOURNIQUET SINGLE 18IN (TOURNIQUET CUFF) ×2 IMPLANT
DRAPE EXTREMITY T 121X128X90 (DRAPE) ×2 IMPLANT
DRAPE SURG 17X23 STRL (DRAPES) ×2 IMPLANT
DRSG PAD ABDOMINAL 8X10 ST (GAUZE/BANDAGES/DRESSINGS) ×2 IMPLANT
GAUZE SPONGE 4X4 12PLY STRL (GAUZE/BANDAGES/DRESSINGS) ×2 IMPLANT
GAUZE XEROFORM 1X8 LF (GAUZE/BANDAGES/DRESSINGS) ×2 IMPLANT
GLOVE BIOGEL PI IND STRL 7.0 (GLOVE) IMPLANT
GLOVE BIOGEL PI IND STRL 8.5 (GLOVE) ×1 IMPLANT
GLOVE BIOGEL PI INDICATOR 7.0 (GLOVE) ×3
GLOVE BIOGEL PI INDICATOR 8.5 (GLOVE) ×1
GLOVE ECLIPSE 6.5 STRL STRAW (GLOVE) ×1 IMPLANT
GLOVE SURG ORTHO 8.0 STRL STRW (GLOVE) ×2 IMPLANT
GOWN STRL REUS W/ TWL LRG LVL3 (GOWN DISPOSABLE) ×1 IMPLANT
GOWN STRL REUS W/TWL LRG LVL3 (GOWN DISPOSABLE) ×2
GOWN STRL REUS W/TWL XL LVL3 (GOWN DISPOSABLE) ×2 IMPLANT
NDL PRECISIONGLIDE 27X1.5 (NEEDLE) IMPLANT
NEEDLE PRECISIONGLIDE 27X1.5 (NEEDLE) ×2 IMPLANT
NS IRRIG 1000ML POUR BTL (IV SOLUTION) ×2 IMPLANT
PACK BASIN DAY SURGERY FS (CUSTOM PROCEDURE TRAY) ×2 IMPLANT
STOCKINETTE 4X48 STRL (DRAPES) ×2 IMPLANT
SUT ETHILON 4 0 PS 2 18 (SUTURE) ×2 IMPLANT
SUT VICRYL 4-0 PS2 18IN ABS (SUTURE) IMPLANT
SYR BULB 3OZ (MISCELLANEOUS) ×2 IMPLANT
SYR CONTROL 10ML LL (SYRINGE) ×1 IMPLANT
TOWEL OR 17X24 6PK STRL BLUE (TOWEL DISPOSABLE) ×2 IMPLANT
UNDERPAD 30X30 (UNDERPADS AND DIAPERS) ×1 IMPLANT

## 2016-06-02 NOTE — Op Note (Signed)
Dictation Number [id number]

## 2016-06-02 NOTE — Anesthesia Preprocedure Evaluation (Signed)
Anesthesia Evaluation  Patient identified by MRN, date of birth, ID band Patient awake    Reviewed: Allergy & Precautions, NPO status , Patient's Chart, lab work & pertinent test results  Airway Mallampati: I  TM Distance: >3 FB Neck ROM: Full    Dental  (+) Teeth Intact, Dental Advisory Given   Pulmonary COPD, former smoker,    breath sounds clear to auscultation       Cardiovascular hypertension, + Peripheral Vascular Disease   Rhythm:Regular Rate:Normal     Neuro/Psych    GI/Hepatic   Endo/Other  diabetes, Well Controlled, Type 2Hypothyroidism   Renal/GU      Musculoskeletal   Abdominal   Peds  Hematology   Anesthesia Other Findings   Reproductive/Obstetrics                             Anesthesia Physical Anesthesia Plan  ASA: III  Anesthesia Plan: MAC and Bier Block   Post-op Pain Management:    Induction: Intravenous  Airway Management Planned: Simple Face Mask  Additional Equipment:   Intra-op Plan:   Post-operative Plan:   Informed Consent: I have reviewed the patients History and Physical, chart, labs and discussed the procedure including the risks, benefits and alternatives for the proposed anesthesia with the patient or authorized representative who has indicated his/her understanding and acceptance.   Dental advisory given  Plan Discussed with: Anesthesiologist, CRNA and Surgeon  Anesthesia Plan Comments:         Anesthesia Quick Evaluation

## 2016-06-02 NOTE — Discharge Instructions (Addendum)

## 2016-06-02 NOTE — Brief Op Note (Signed)
06/02/2016  11:25 AM  PATIENT:  Dylan Knight  82 y.o. male  PRE-OPERATIVE DIAGNOSIS:  left carpal tunnel syndrome   G56.01  POST-OPERATIVE DIAGNOSIS:  left carpal tunnel syndrome   G56.01  PROCEDURE:  Procedure(s): LEFT CARPAL TUNNEL RELEASE (Left)  SURGEON:  Surgeon(s) and Role:    * Daryll Brod, MD - Primary  PHYSICIAN ASSISTANT:   ASSISTANTS: none   ANESTHESIA:   local and regional  EBL:  Total I/O In: 600 [I.V.:600] Out: 1 [Blood:1]  BLOOD ADMINISTERED:none  DRAINS: none   LOCAL MEDICATIONS USED:  BUPIVICAINE   SPECIMEN:  No Specimen  DISPOSITION OF SPECIMEN:  N/A  COUNTS:  YES  TOURNIQUET:   Total Tourniquet Time Documented: Forearm (Left) - 19 minutes Total: Forearm (Left) - 19 minutes   DICTATION: .Other Dictation: Dictation Number [id number]  PLAN OF CARE: Discharge to home after PACU  PATIENT DISPOSITION:  PACU - hemodynamically stable.

## 2016-06-02 NOTE — H&P (Signed)
Dylan Knight is an 82 y.o. male.   Chief Complaint: numbness left hand HPI: Dylan Knight is 82 years old. He has had multiple operations in the past including carpal tunnel release on his right side. He continues to complain of numbness and tingling left side in the median nerve distribution with some atrophy.   He also has a trigger finger on his left middle finger and is unable to fully flex it and states it no longer bothers him.  He is not presently taking anything for his carpal tunnel syndrome. He has had nerve conductions done by Dr. Bennie Pierini revealing a peripheral neuropathy and a severe carpal tunnel syndrome left side. There was no evidence of cervical radiculopathy on his nerve conductions which were done on 03-15-16. This reveals a motor delay of 8.54 on his left side, right side is normal. This has been released in the past.  He does have a history of diabetes. He is on Plavix.  He has had release of trigger fingers in the past. He has numbness and tingleing left hand. He has positive nerve conduction indicating carpal tunnel syndrome.     Past Medical History:  Diagnosis Date  . Arthritis    left hip  . BPH (benign prostatic hyperplasia)   . Cancer [facility])    Skin cancers removed  . Carotid artery occlusion   . COPD (chronic obstructive pulmonary disease) ([place])   . Diabetes mellitus 2011   Border line  . Diabetes mellitus without complication ([place])   . Hyperlipidemia   . Hypertension   . Hypothyroidism   . Macular degeneration   . Peripheral arterial disease ([place]) 2002  . Peripheral vascular disease ([place])   . Peripheral vascular disease (HCC)    PAD  . Stroke ([place]) 2005  . Stroke [facility])    2005 - Dr Michael Boston - [place]    Past Surgical History:  Procedure Laterality Date  . ABDOMINAL AORTIC ANEURYSM REPAIR  2003   Aorto-left femoral-right iliac BPG by Dr. Scot Dock  . Aortic aneursym surgery     2003- Dr. Scot Dock  . BACK SURGERY     1960  . CAROTID  ENDARTERECTOMY  Jan. 25,2007   Right CEA  . Carotid stenosis surgery on right     2007  . EYE SURGERY     Catarct surgery and lens implant - bilateral  . PR VEIN BYPASS GRAFT,AORTO-FEM-POP  2005   Revision Right Fem-pop  . PR VEIN BYPASS GRAFT,AORTO-FEM-POP  2002   Right Fem-pop  . Right leg bypass     2002 -  and amputation of right fifth toe  . SPINE SURGERY  1960   lumbar spine  . [place], 2007   cervical spine  . TOE AMPUTATION  08-2001   right 5th toe amputation    Family History  Problem Relation Age of Onset  . Stroke Mother   . Hypertension Mother   . Heart disease Father   . Heart attack Father   . Cancer Brother   . Hypertension Brother    Social History:  reports that he quit smoking about 12 years ago. His smoking use included Cigarettes. He has a 63.00 pack-year smoking history. He does not have any smokeless tobacco history on file. He reports that he does not drink alcohol or use drugs.  Allergies:  Allergies  Allergen Reactions  . Iohexol Hives    Went away with benadryl   . Iodine Rash  No prescriptions prior to admission.    No results found for this or any previous visit (from the past 48 hour(s)).  No results found.   Pertinent items are noted in HPI.  There were no vitals taken for this visit.  General appearance: alert, cooperative and appears stated age Head: Normocephalic, without obvious abnormality Neck: no JVD Resp: clear to auscultation bilaterally Cardio: regular rate and rhythm, S1, S2 normal, no murmur, click, rub or gallop GI: soft, non-tender; bowel sounds normal; no masses,  no organomegaly Extremities: numbness left hand Pulses: 2+ and symmetric Skin: Skin color, texture, turgor normal. No rashes or lesions Neurologic: Grossly normal Incision/Wound: na  Assessment/Plan Plan: He has had multiple other digits surgically released in the apst. He would like to proceed to have the  left hand carpal tunnel  release . He is scheduled as an outpatient under regional anesthesia for release of left carpal tunnel. Preoperative and postoperative course have been discussed along with risks and complications. He is aware there is no guarantee with the surgery, possibility of infection, recurrence, injury to arteries, nerves, tendons, complete relief of symptoms.  Dylan Knight 06/02/2016, 8:27 AM

## 2016-06-02 NOTE — Anesthesia Procedure Notes (Signed)
Procedure Name: MAC Date/Time: 06/02/2016 10:50 AM Performed by: Marrianne Mood Pre-anesthesia Checklist: Patient identified, Timeout performed, Emergency Drugs available, Suction available and Patient being monitored Patient Re-evaluated:Patient Re-evaluated prior to inductionOxygen Delivery Method: Simple face mask

## 2016-06-02 NOTE — Anesthesia Postprocedure Evaluation (Signed)
Anesthesia Post Note  Patient: Dylan Knight  Procedure(s) Performed: Procedure(s) (LRB): LEFT CARPAL TUNNEL RELEASE (Left)  Patient location during evaluation: PACU Anesthesia Type: General and MAC Level of consciousness: awake and alert Pain management: pain level controlled Vital Signs Assessment: post-procedure vital signs reviewed and stable Respiratory status: spontaneous breathing, nonlabored ventilation and respiratory function stable Cardiovascular status: blood pressure returned to baseline and stable Postop Assessment: no signs of nausea or vomiting Anesthetic complications: no    Last Vitals:  Vitals:   06/02/16 1155 06/02/16 1227  BP:  (!) 160/73  Pulse: 72 73  Resp: 13 18  Temp:  36.6 C    Last Pain:  Vitals:   06/02/16 1227  TempSrc:   PainSc: 0-No pain                 [name] A

## 2016-06-02 NOTE — Anesthesia Procedure Notes (Signed)
Anesthesia Regional Block:  Bier block (IV Regional)Bier block (IV Regional) Narrative:

## 2016-06-02 NOTE — Transfer of Care (Signed)
Immediate Anesthesia Transfer of Care Note  Patient: Dylan Knight  Procedure(s) Performed: Procedure(s): LEFT CARPAL TUNNEL RELEASE (Left)  Patient Location: PACU  Anesthesia Type:Bier block  Level of Consciousness: awake and patient cooperative  Airway & Oxygen Therapy: Patient Spontanous Breathing and Patient connected to face mask oxygen  Post-op Assessment: Report given to RN and Post -op Vital signs reviewed and stable  Post vital signs: Reviewed and stable  Last Vitals:  Vitals:   06/02/16 1030  BP: (!) 128/58  Pulse: 68  Resp: 18  Temp: 37.1 C    Last Pain:  Vitals:   06/02/16 1030  TempSrc: Oral         Complications: No apparent anesthesia complications

## 2016-06-03 NOTE — Op Note (Signed)
NAME:  Dylan Knight, HOLZHAUSEN               ACCOUNT NO.:  [id number]  MEDICAL RECORD NO.:  [id number]  LOCATION:                                 FACILITY:  PHYSICIAN:  Daryll Brod, M.D.       DATE OF BIRTH:  [birth year]  DATE OF PROCEDURE:  06/02/2016 DATE OF DISCHARGE:                              OPERATIVE REPORT   PREOPERATIVE DIAGNOSIS:  Carpal tunnel syndrome, left hand.  POSTOPERATIVE DIAGNOSIS:  Carpal tunnel syndrome, left hand.  OPERATION:  Decompression of left median nerve.  SURGEON:  Daryll Brod, M.D.  ANESTHESIA:  Forearm-based IV regional with local infiltration.  ANESTHESIOLOGIST:  Lorrene Reid, M.D.  PLACE OF SURGERY:  [facility].  HISTORY:  The patient is a 82 year old male with history of carpal tunnel syndrome bilaterally.  He has undergone multiple trigger finger release to release of his right median nerve.  He was admitted now for release of his left median nerve.  He has had nerve conductions done revealing carpal tunnel syndrome on his left side.  He is aware of risks and complications including the possibility of infection; recurrence of injury to arteries, nerves, tendons; incomplete relief of symptoms and dystrophy.  In the preoperative area, the patient is seen, the extremity marked by both patient and surgeon.  An antibiotic was given.  Questions were encouraged and answered to his satisfaction.  PROCEDURE IN DETAIL:  The patient was brought to the operating room, where a forearm-based IV regional anesthetic was carried out without difficulty under the direction of Dr. Al Corpus.  He was prepped using ChloraPrep, supine position with left arm free.  A 3-minute time-out was taken confirming the patient and procedure.  A longitudinal incision was made in the left palm, carried down through the subcutaneous tissue. Bleeders were electrocauterized with bipolar.  The palmar fascia was split.  The superficial palmar arch was identified.  The flexor  tendon to the ring and little finger was identified.  To the ulnar side of median nerve, the carpal retinaculum was then incised with sharp dissection.  A right angle and Sewall retractor were placed between the skin and forearm fascia.  The forearm fascia was then released for approximately 2 cm proximal to the wrist crease under direct vision. This was done after separating the subcutaneous tissue from the fascia and separating the nerve dorsally from the fascia.  The canal was explored.  No further lesions were identified.  An area compression to the nerve was apparent.  The motor branch entered into muscle distally. The wound was copiously irrigated with sterile saline.  The skin was then closed with interrupted 4-0 nylon sutures.  Local infiltration with 0.25% bupivacaine without epinephrine was given, approximately 8 mL was instilled.  A sterile compressive dressing with the fingers free was applied.  On deflation of the tourniquet, all fingers were immediately pinked.  He was taken to the recovery room for observation in satisfactory condition.  He will be discharged to home to return to the [place] in 1 week, on tramadol.    ______________________________ Daryll Brod, M.D.   ______________________________ Daryll Brod, M.D.    GK/MEDQ  D:  06/02/2016  T:  06/03/2016  Job:  [id number]

## 2016-06-05 ENCOUNTER — Encounter (HOSPITAL_BASED_OUTPATIENT_CLINIC_OR_DEPARTMENT_OTHER): Payer: Self-pay | Admitting: Orthopedic Surgery

## 2016-06-05 NOTE — Addendum Note (Signed)
Addended by: Thresa Ross C on: 06/05/2016 11:43 AM   Modules accepted: Orders

## 2016-08-03 ENCOUNTER — Encounter (HOSPITAL_COMMUNITY): Payer: Medicare Other

## 2016-08-03 ENCOUNTER — Ambulatory Visit: Payer: Medicare Other | Admitting: Family

## 2016-08-20 ENCOUNTER — Ambulatory Visit (INDEPENDENT_AMBULATORY_CARE_PROVIDER_SITE_OTHER): Payer: Medicare Other | Admitting: Ophthalmology

## 2016-08-20 DIAGNOSIS — E11311 Type 2 diabetes mellitus with unspecified diabetic retinopathy with macular edema: Secondary | ICD-10-CM

## 2016-08-20 DIAGNOSIS — I10 Essential (primary) hypertension: Secondary | ICD-10-CM

## 2016-08-20 DIAGNOSIS — E113212 Type 2 diabetes mellitus with mild nonproliferative diabetic retinopathy with macular edema, left eye: Secondary | ICD-10-CM | POA: Diagnosis not present

## 2016-08-20 DIAGNOSIS — H353211 Exudative age-related macular degeneration, right eye, with active choroidal neovascularization: Secondary | ICD-10-CM | POA: Diagnosis not present

## 2016-08-20 DIAGNOSIS — E113291 Type 2 diabetes mellitus with mild nonproliferative diabetic retinopathy without macular edema, right eye: Secondary | ICD-10-CM | POA: Diagnosis not present

## 2016-08-20 DIAGNOSIS — H348112 Central retinal vein occlusion, right eye, stable: Secondary | ICD-10-CM

## 2016-08-20 DIAGNOSIS — H35033 Hypertensive retinopathy, bilateral: Secondary | ICD-10-CM | POA: Diagnosis not present

## 2016-09-23 ENCOUNTER — Encounter: Payer: Self-pay | Admitting: Vascular Surgery

## 2016-09-30 ENCOUNTER — Encounter: Payer: Self-pay | Admitting: Vascular Surgery

## 2016-09-30 ENCOUNTER — Encounter: Payer: Self-pay | Admitting: Emergency Medicine

## 2016-09-30 ENCOUNTER — Ambulatory Visit (HOSPITAL_COMMUNITY)
Admission: RE | Admit: 2016-09-30 | Discharge: 2016-09-30 | Disposition: A | Discharge status: Home / Self Care | Payer: Medicare Other | Source: Ambulatory Visit | Attending: Family | Admitting: Family

## 2016-09-30 ENCOUNTER — Ambulatory Visit (INDEPENDENT_AMBULATORY_CARE_PROVIDER_SITE_OTHER): Payer: Medicare Other | Admitting: Emergency Medicine

## 2016-09-30 ENCOUNTER — Ambulatory Visit (INDEPENDENT_AMBULATORY_CARE_PROVIDER_SITE_OTHER): Payer: Medicare Other | Admitting: Vascular Surgery

## 2016-09-30 ENCOUNTER — Ambulatory Visit (INDEPENDENT_AMBULATORY_CARE_PROVIDER_SITE_OTHER)
Admission: RE | Admit: 2016-09-30 | Discharge: 2016-09-30 | Disposition: A | Discharge status: Home / Self Care | Payer: Medicare Other | Source: Ambulatory Visit | Attending: Family | Admitting: Family

## 2016-09-30 VITALS — BP 127/75 | HR 80 | Temp 97.9°F | Resp 18 | Ht 66.0 in | Wt 165.2 lb

## 2016-09-30 DIAGNOSIS — I779 Disorder of arteries and arterioles, unspecified: Secondary | ICD-10-CM | POA: Diagnosis present

## 2016-09-30 DIAGNOSIS — Z9889 Other specified postprocedural states: Secondary | ICD-10-CM

## 2016-09-30 DIAGNOSIS — J449 Chronic obstructive pulmonary disease, unspecified: Secondary | ICD-10-CM | POA: Diagnosis not present

## 2016-09-30 DIAGNOSIS — I6529 Occlusion and stenosis of unspecified carotid artery: Secondary | ICD-10-CM | POA: Diagnosis not present

## 2016-09-30 NOTE — Assessment & Plan Note (Signed)
Doing well, good functional capacity - doing exercise at church  Please continue to keep your Duoneb available to use as needed You should not need to get another pneumonia vaccine.  Flu shot is up to date.  Follow with Dr Lamonte Sakai in 6 months or sooner if you have any problems

## 2016-09-30 NOTE — Progress Notes (Signed)
Subjective:    Patient ID: Dylan Knight, male    DOB: 1924/05/29, 82 y.o.   MRN: [id number]  HPI 82 yo former smoker, 60+ pk-yrs, hx DM, PVD (fem bypass), AAA with stenting, HTN, CVA '05. Was dx with COPD in 12/13 when he was admitted to [facility] for CAP and a flare. He was discharged on Advair and combivent, but he didn't stay on these - poor coordination taking HFA's. He is typically able to exert himself without stopping. Has a daily frequent cough and has had this for over 20 yrs. Productive of white mucous. He doesn't hear wheezing. He has had many episodes of PNA vs AE, averages about once a year. Has been admitted 3 times in the past. Not on any inhaled medications right now, feels at baseline.   ROV 12/13/12 -- follow up for COPD and cough, frequent exacerbations. Had PFT today >> moderately severe AFL, no BD response, normal volumes, decreased DLCO that corrects for Va.  Feels that he has been doing well since last time. Coughs occasionally during the day. The cough is better at night on the tessalon. No clear heartburn sx. Not currently on BD's - difficulty doing them due to coordination. He is taking mucinex-D. No real nasal or head congestion.   ROV 01/27/13 -- COPD with mod severe AFL. He had a URI 2 weeks ago, caught from her wife. He is still having green phlegm. We started Duonebs last time > he didn't like them, didn't fell a benefit. He did them for 5-6 days and then stopped. He remains on mucinex. Benefited from [place].   ROV 04/18/13 -- COPD with mod severe AFL.  He is using DuoNebs bid, still coughs some phlegm but much better than last time.   ROV 11/15/13 -- COPD, moderately severe AFL.  He was unfortunately admitted to [facility] for AE-COPD and PNA, treated with abx, steroids. Happens about once a year, usually in the Fall. He is using duonebs less > about qd instead of bid (he knows he could be using 4x a day). He is active. Able to exert. Occasional cough, minimal cough.    ROV 03/10/14 -- COPD, moderately severe AFL.  They have sold their house and have downsized. No more hospitalizations, no flares since last visit.  He is using duonebs about 2x a week, our plans had been to use qid. Not sure he misses it. Minimal cough or wheeze. Taking loratadine reliably.   ROV 09/26/14 -- follow up visit for COPD. He has been managed on albuterol / atrovent BID. He is uncertain that the medication helps him. He unfortunately fell in July and hurt R leg.  He remains able to get around, he no longer drives. He has recently noticed throat hoarseness. Has clear / yellow nasal drainage. No fever but he has had thick yellow sputum.   ROV 07/16/15 -- follow-up visit for obstructive lung disease and COPD. He is currently using Duonebs, but not reliably. He is not clear that it helps him, so he neglects to use it. It may be decreasing his cough some according to his wife.    ROV 03/23/16 -- 4 yo man with hx COPD. Feels that he has been doing fairly well - denies any dyspnea, able to get around, uses a walker for balance.  He has duoneb available to use prn. On loratadine prn. He has minimal cough  ROV 09/30/16 -- this is a follow-up visit for COPD. He is currently managed on DuoNeb prn -  has not needed since last time. Minimal cough, no mucous. No flares since last time.      Objective:   Physical Exam  Vitals:   09/30/16 1040  BP: 108/66  BP Location: Right Arm  Cuff Size: Normal  Pulse: 64  SpO2: 94%  Weight: 164 lb (74.4 kg)  Height: 5\' 6"  (1.676 m)   Gen: Pleasant, elderly man, in no distress,  normal affect  ENT: No lesions,  mouth clear,  oropharynx clear, no postnasal drip  Neck: No JVD, no TMG, no carotid bruits  Lungs: No use of accessory muscles, distant, clear without rales or rhonchi  Cardiovascular: RRR, heart sounds normal, no murmur or gallops, no peripheral edema  Musculoskeletal: No deformities, no cyanosis or clubbing  Neuro: alert, non focal  Skin:  Warm, no lesions or rash     Assessment & Plan:  COPD (chronic obstructive pulmonary disease) Doing well, good functional capacity - doing exercise at church  Please continue to keep your Duoneb available to use as needed You should not need to get another pneumonia vaccine.  Flu shot is up to date.  Follow with Dr Lamonte Sakai in 6 months or sooner if you have any problems  Baltazar Apo, MD, PhD 09/30/2016, 10:59 AM [facility] [phone] or if no answer [phone]

## 2016-09-30 NOTE — Progress Notes (Signed)
Patient name: Dylan Knight MRN: [id number] DOB: [birth year] Sex: male  REASON FOR VISIT: Follow up   HPI: Dylan Knight is a 82 y.o. male who was last seen in our office by the nurse practitioner in 2014.  He has had multiple previous vascular procedures: 1. He had a right carotid endarterectomy in 2007. 2. He has had a previous left carotid stent. 3. He underwent repair of an abdominal aortic aneurysm in 2003. 4. He had a right femoropopliteal bypass graft in 2005.  Since I saw him last, he denies any history of claudication, rest pain, or nonhealing ulcers.  He denies any history of stroke, TIAs, expressive or receptive aphasia, or amaurosis fugax.  He is on a statin. He does not take aspirin because he takes Plavix.  Past Medical History:  Diagnosis Date  . Arthritis    left hip  . BPH (benign prostatic hyperplasia)   . Cancer [facility])    Skin cancers removed  . Carotid artery occlusion   . COPD (chronic obstructive pulmonary disease) ([place])   . Diabetes mellitus 2011   Border line  . Diabetes mellitus without complication ([place])   . Hyperlipidemia   . Hypertension   . Hypothyroidism   . Macular degeneration   . Peripheral arterial disease ([place]) 2002  . Peripheral vascular disease ([place])   . Peripheral vascular disease (HCC)    PAD  . Stroke ([place]) 2005  . Stroke [facility])    2005 - Dr Michael Boston - [place]    Family History  Problem Relation Age of Onset  . Stroke Mother   . Hypertension Mother   . Heart disease Father   . Heart attack Father   . Cancer Brother   . Hypertension Brother     SOCIAL HISTORY: Social History  Substance Use Topics  . Smoking status: Former Smoker    Packs/day: 1.00    Years: 63.00    Types: Cigarettes    Quit date: 04/09/2004  . Smokeless tobacco: Former Systems developer     Comment: Quit smoking in 2005  . Alcohol use No    Allergies  Allergen Reactions  . Iohexol Hives    Went away with benadryl   . Iodine Rash     Current Outpatient Prescriptions  Medication Sig Dispense Refill  . acetaminophen (TYLENOL) 325 MG tablet Take 650 mg by mouth as needed.    Marland Kitchen atenolol-chlorthalidone (TENORETIC) 50-25 MG per tablet 0.5 tablets daily.     . benzonatate (TESSALON) 100 MG capsule Take 2 capsules (200 mg total) by mouth 3 (three) times daily as needed for cough. 30 capsule 5  . clopidogrel (PLAVIX) 75 MG tablet Take 75 mg by mouth daily.    Marland Kitchen ezetimibe-simvastatin (VYTORIN) 10-40 MG per tablet Take 1 tablet by mouth at bedtime.    Marland Kitchen guaiFENesin (MUCINEX) 600 MG 12 hr tablet Take 600 mg by mouth daily.    Marland Kitchen levothyroxine (SYNTHROID, LEVOTHROID) 25 MCG tablet 1.5 tablets every morning. Take one and one half tablet every morning 30 min. Before breakfast.    . Multiple Vitamins-Minerals (CENTRUM SILVER PO) Take 1 tablet by mouth daily.    . Multiple Vitamins-Minerals (OCUVITE PRESERVISION PO) Take 1 tablet by mouth 2 (two) times daily.    Marland Kitchen propylthiouracil (PTU) 50 MG tablet Take 100 mg by mouth every morning.    . sitaGLIPtan-metformin (JANUMET) 50-500 MG per tablet Take 1 tablet by mouth 2 (two) times daily with a meal.    .  albuterol (PROVENTIL HFA;VENTOLIN HFA) 108 (90 BASE) MCG/ACT inhaler Inhale 2 puffs into the lungs every 6 (six) hours as needed for wheezing or shortness of breath. (Patient not taking: Reported on 09/30/2016) 1 Inhaler 6  . ipratropium (ATROVENT) 0.02 % nebulizer solution Take 2.5 mLs (500 mcg total) by nebulization 4 (four) times daily. (Patient not taking: Reported on 09/30/2016) 75 mL 6  . ipratropium-albuterol (DUONEB) 0.5-2.5 (3) MG/3ML SOLN Take 3 mLs by nebulization 2 (two) times daily. (Patient not taking: Reported on 09/30/2016) 360 mL 5  . loratadine (CLARITIN) 10 MG tablet Take 10 mg by mouth daily. Reported on 01/03/2016     No current facility-administered medications for this visit.     REVIEW OF SYSTEMS:  [X]  denotes positive finding, [ ]  denotes negative finding Cardiac   Comments:  Chest pain or chest pressure:    Shortness of breath upon exertion:    Short of breath when lying flat:    Irregular heart rhythm:        Vascular    Pain in calf, thigh, or hip brought on by ambulation:    Pain in feet at night that wakes you up from your sleep:     Blood clot in your veins:    Leg swelling:         Pulmonary    Oxygen at home:    Productive cough:     Wheezing:         Neurologic    Sudden weakness in arms or legs:     Sudden numbness in arms or legs:     Sudden onset of difficulty speaking or slurred speech:    Temporary loss of vision in one eye:     Problems with dizziness:         Gastrointestinal    Blood in stool:     Vomited blood:         Genitourinary    Burning when urinating:     Blood in urine:        Psychiatric    Major depression:         Hematologic    Bleeding problems:    Problems with blood clotting too easily:        Skin    Rashes or ulcers:        Constitutional    Fever or chills:      PHYSICAL EXAM: Vitals:   09/30/16 1431  BP: 127/75  Pulse: 80  Resp: 18  Temp: 97.9 F (36.6 C)  TempSrc: Oral  SpO2: 96%  Weight: 165 lb 3.2 oz (74.9 kg)  Height: 5\' 6"  (1.676 m)    GENERAL: The patient is a well-nourished male, in no acute distress. The vital signs are documented above. CARDIAC: There is a regular rate and rhythm.  VASCULAR: I do not detect carotid bruits.  He has palpable femoral pulses. I cannot palpate popliteal or pedal pulses. He has no significant lower extremity swelling. PULMONARY: There is good air exchange bilaterally without wheezing or rales. ABDOMEN: Soft and non-tender with normal pitched bowel sounds.  MUSCULOSKELETAL: There are no major deformities or cyanosis. NEUROLOGIC: No focal weakness or paresthesias are detected. SKIN: There are no ulcers or rashes noted. PSYCHIATRIC: The patient has a normal affect.  DATA:   LOWER EXTREMITY ARTERIAL DUPLEX: I have indicated only  interpreted his lower extremity arterial duplex scan. His right femoropopliteal bypass graft is patent without any areas of concern.  LOWER EXTREMITY ARTERIAL DOPPLER: I  have independently interpreted his lower extremity arterial Doppler study.  On the right side, there is a biphasic dorsalis pedis and posterior tibial signal. ABI is 100%.  On the left side there is a monophasic dorsalis pedis and posterior tibial signal with an ABI of 68%.  MEDICAL ISSUES:  PERIPHERAL VASCULAR DISEASE: His right femoropopliteal bypass graft is patent. His infrainguinal arterial occlusive disease on the left is stable. I've encouraged him to stay as active as possible. He is not a smoker. I have ordered a follow up ABI and graft duplex in August 2018. I'm trying to coordinate all of his follow up studies into one yearly visit.  CAROTID DISEASE: He is status post right carotid endarterectomy in 2007 and has also had a previous left carotid stent. I've arranged for him to have his yearly carotid duplex scan in August 2018. Again I'm trying to cord and a all of his follow up studies into a once a year visit. I will see him back at that time. He knows to call sooner if he has problems.   Deitra Mayo [facility] [phone]

## 2016-09-30 NOTE — Patient Instructions (Signed)
Please continue to keep your Duoneb available to use as needed You should not need to get another pneumonia vaccine.  Flu shot is up to date.  Follow with Dr Lamonte Sakai in 6 months or sooner if you have any problems

## 2016-10-05 NOTE — Addendum Note (Signed)
Addended by: Lianne Cure A on: 10/05/2016 08:22 AM   Modules accepted: Orders

## 2016-10-08 ENCOUNTER — Encounter (INDEPENDENT_AMBULATORY_CARE_PROVIDER_SITE_OTHER): Payer: Medicare Other | Admitting: Ophthalmology

## 2016-10-08 DIAGNOSIS — H353221 Exudative age-related macular degeneration, left eye, with active choroidal neovascularization: Secondary | ICD-10-CM | POA: Diagnosis not present

## 2016-10-08 DIAGNOSIS — I10 Essential (primary) hypertension: Secondary | ICD-10-CM | POA: Diagnosis not present

## 2016-10-08 DIAGNOSIS — H43813 Vitreous degeneration, bilateral: Secondary | ICD-10-CM | POA: Diagnosis not present

## 2016-10-08 DIAGNOSIS — H35033 Hypertensive retinopathy, bilateral: Secondary | ICD-10-CM

## 2016-10-08 DIAGNOSIS — E113291 Type 2 diabetes mellitus with mild nonproliferative diabetic retinopathy without macular edema, right eye: Secondary | ICD-10-CM | POA: Diagnosis not present

## 2016-10-08 DIAGNOSIS — E113212 Type 2 diabetes mellitus with mild nonproliferative diabetic retinopathy with macular edema, left eye: Secondary | ICD-10-CM

## 2016-10-08 DIAGNOSIS — H348112 Central retinal vein occlusion, right eye, stable: Secondary | ICD-10-CM

## 2016-10-08 DIAGNOSIS — E11311 Type 2 diabetes mellitus with unspecified diabetic retinopathy with macular edema: Secondary | ICD-10-CM | POA: Diagnosis not present

## 2016-10-30 ENCOUNTER — Encounter: Payer: Self-pay | Admitting: Emergency Medicine

## 2016-10-30 ENCOUNTER — Emergency Department
Admission: EM | Admit: 2016-10-30 | Discharge: 2016-10-30 | Disposition: A | Discharge status: Home / Self Care | Payer: Medicare Other | Source: Home / Self Care | Attending: Emergency Medicine | Admitting: Emergency Medicine

## 2016-10-30 ENCOUNTER — Emergency Department: Payer: Medicare Other

## 2016-10-30 DIAGNOSIS — M545 Low back pain, unspecified: Secondary | ICD-10-CM

## 2016-10-30 DIAGNOSIS — E119 Type 2 diabetes mellitus without complications: Secondary | ICD-10-CM | POA: Insufficient documentation

## 2016-10-30 DIAGNOSIS — I10 Essential (primary) hypertension: Secondary | ICD-10-CM | POA: Insufficient documentation

## 2016-10-30 DIAGNOSIS — Z79899 Other long term (current) drug therapy: Secondary | ICD-10-CM

## 2016-10-30 DIAGNOSIS — M5440 Lumbago with sciatica, unspecified side: Secondary | ICD-10-CM | POA: Diagnosis not present

## 2016-10-30 DIAGNOSIS — E039 Hypothyroidism, unspecified: Secondary | ICD-10-CM

## 2016-10-30 DIAGNOSIS — Z888 Allergy status to other drugs, medicaments and biological substances status: Secondary | ICD-10-CM | POA: Diagnosis not present

## 2016-10-30 DIAGNOSIS — J441 Chronic obstructive pulmonary disease with (acute) exacerbation: Secondary | ICD-10-CM | POA: Diagnosis not present

## 2016-10-30 DIAGNOSIS — Z89421 Acquired absence of other right toe(s): Secondary | ICD-10-CM

## 2016-10-30 DIAGNOSIS — E785 Hyperlipidemia, unspecified: Secondary | ICD-10-CM | POA: Diagnosis not present

## 2016-10-30 DIAGNOSIS — Z961 Presence of intraocular lens: Secondary | ICD-10-CM | POA: Diagnosis present

## 2016-10-30 DIAGNOSIS — Z9841 Cataract extraction status, right eye: Secondary | ICD-10-CM | POA: Diagnosis not present

## 2016-10-30 DIAGNOSIS — Z9842 Cataract extraction status, left eye: Secondary | ICD-10-CM

## 2016-10-30 DIAGNOSIS — H3530 Unspecified macular degeneration: Secondary | ICD-10-CM | POA: Diagnosis present

## 2016-10-30 DIAGNOSIS — Z7902 Long term (current) use of antithrombotics/antiplatelets: Secondary | ICD-10-CM

## 2016-10-30 DIAGNOSIS — Z8673 Personal history of transient ischemic attack (TIA), and cerebral infarction without residual deficits: Secondary | ICD-10-CM | POA: Diagnosis not present

## 2016-10-30 DIAGNOSIS — Z87891 Personal history of nicotine dependence: Secondary | ICD-10-CM

## 2016-10-30 DIAGNOSIS — Z8249 Family history of ischemic heart disease and other diseases of the circulatory system: Secondary | ICD-10-CM

## 2016-10-30 DIAGNOSIS — R0902 Hypoxemia: Secondary | ICD-10-CM | POA: Diagnosis present

## 2016-10-30 DIAGNOSIS — M479 Spondylosis, unspecified: Secondary | ICD-10-CM | POA: Diagnosis not present

## 2016-10-30 DIAGNOSIS — G8929 Other chronic pain: Secondary | ICD-10-CM | POA: Diagnosis present

## 2016-10-30 DIAGNOSIS — R0602 Shortness of breath: Secondary | ICD-10-CM | POA: Diagnosis present

## 2016-10-30 DIAGNOSIS — M25551 Pain in right hip: Secondary | ICD-10-CM | POA: Insufficient documentation

## 2016-10-30 DIAGNOSIS — Z66 Do not resuscitate: Secondary | ICD-10-CM | POA: Diagnosis not present

## 2016-10-30 DIAGNOSIS — E1151 Type 2 diabetes mellitus with diabetic peripheral angiopathy without gangrene: Secondary | ICD-10-CM | POA: Diagnosis not present

## 2016-10-30 DIAGNOSIS — Z85828 Personal history of other malignant neoplasm of skin: Secondary | ICD-10-CM | POA: Diagnosis not present

## 2016-10-30 DIAGNOSIS — Z7982 Long term (current) use of aspirin: Secondary | ICD-10-CM | POA: Diagnosis not present

## 2016-10-30 MED ORDER — METHOCARBAMOL 500 MG PO TABS: 500.0000 mg | ORAL_TABLET | Freq: Three times a day (TID) | ORAL | 0 refills | Status: DC | PRN

## 2016-10-30 MED ORDER — DICLOFENAC SODIUM 1 % TD GEL: 2.0000 g | Freq: Four times a day (QID) | TRANSDERMAL | 0 refills | Status: DC

## 2016-10-30 NOTE — ED Provider Notes (Signed)
[facility] Emergency Department Provider Note  ____________________________________________  Time seen: Approximately 11:33 AM  I have reviewed the triage vital signs and the nursing notes.   HISTORY  Chief Complaint Back Pain and Leg Pain    HPI Dylan Knight is a 82 y.o. male who complains of right lower back pain and right hip pain has been worsening over the past few days. Gradual onset. No slips trips or falls. No trauma. No fever or chills. No vomiting diarrhea or constipation. No abdominal pain. No urinary changes. Has a history of similar pain, for which she took Robaxin in the past. Last episode was several months ago. He took Robaxin this morning that he had that is 82 years old, without relief.  Pain is not radiating. Worse with movement, no specific alleviating factors Past Medical History:  Diagnosis Date  . Arthritis    left hip  . BPH (benign prostatic hyperplasia)   . Cancer [facility])    Skin cancers removed  . Carotid artery occlusion   . COPD (chronic obstructive pulmonary disease) ([place])   . Diabetes mellitus 2011   Border line  . Diabetes mellitus without complication ([place])   . Hyperlipidemia   . Hypertension   . Hypothyroidism   . Macular degeneration   . Peripheral arterial disease ([place]) 2002  . Peripheral vascular disease ([place])   . Peripheral vascular disease (HCC)    PAD  . Stroke ([place]) 2005  . Stroke [facility])    2005 - Dr Michael Boston [place]     Patient Active Problem List   Diagnosis Date Noted  . Aftercare following surgery of the circulatory system, [place] 06/28/2013  . Cough 04/18/2013  . COPD (chronic obstructive pulmonary disease) ([place]) 11/22/2012  . Occlusion and stenosis of carotid artery without mention of cerebral infarction 06/29/2012  . Peripheral vascular disease, unspecified 12/30/2011     Past Surgical History:  Procedure Laterality Date  . ABDOMINAL AORTIC ANEURYSM REPAIR  2003   Aorto-left  femoral-right iliac BPG by Dr. Scot Dock  . Aortic aneursym surgery     2003- Dr. Scot Dock  . BACK SURGERY     1960  . CAROTID ENDARTERECTOMY  Jan. 25,2007   Right CEA  . Carotid stenosis surgery on right     2007  . CARPAL TUNNEL RELEASE Left 06/02/2016   Procedure: LEFT CARPAL TUNNEL RELEASE;  Surgeon: Daryll Brod, MD;  Location: [place];  Service: Orthopedics;  Laterality: Left;  . EYE SURGERY     Catarct surgery and lens implant - bilateral  . PR VEIN BYPASS GRAFT,AORTO-FEM-POP  2005   Revision Right Fem-pop  . PR VEIN BYPASS GRAFT,AORTO-FEM-POP  2002   Right Fem-pop  . Right leg bypass     2002 -  and amputation of right fifth toe  . SPINE SURGERY  1960   lumbar spine  . [place], 2007   cervical spine  . TOE AMPUTATION  08-2001   right 5th toe amputation     Prior to Admission medications   Medication Sig Start Date End Date Taking? Authorizing Provider  acetaminophen (TYLENOL) 325 MG tablet Take 650 mg by mouth as needed.    Historical Provider, MD  albuterol (PROVENTIL HFA;VENTOLIN HFA) 108 (90 BASE) MCG/ACT inhaler Inhale 2 puffs into the lungs every 6 (six) hours as needed for wheezing or shortness of breath. Patient not taking: Reported on 09/30/2016 07/16/15   Collene Gobble, MD  atenolol-chlorthalidone (  TENORETIC) 50-25 MG per tablet 0.5 tablets daily.  04/24/13   Historical Provider, MD  benzonatate (TESSALON) 100 MG capsule Take 2 capsules (200 mg total) by mouth 3 (three) times daily as needed for cough. 12/17/15   Collene Gobble, MD  clopidogrel (PLAVIX) 75 MG tablet Take 75 mg by mouth daily.    Historical Provider, MD  diclofenac sodium (VOLTAREN) 1 % GEL Apply 2 g topically 4 (four) times daily. 10/30/16   Carrie Mew, MD  ezetimibe-simvastatin (VYTORIN) 10-40 MG per tablet Take 1 tablet by mouth at bedtime.    Historical Provider, MD  guaiFENesin (MUCINEX) 600 MG 12 hr tablet Take 600 mg by mouth daily.    Historical Provider, MD   ipratropium (ATROVENT) 0.02 % nebulizer solution Take 2.5 mLs (500 mcg total) by nebulization 4 (four) times daily. Patient not taking: Reported on 09/30/2016 04/03/14   Collene Gobble, MD  ipratropium-albuterol (DUONEB) 0.5-2.5 (3) MG/3ML SOLN Take 3 mLs by nebulization 2 (two) times daily. Patient not taking: Reported on 09/30/2016 07/16/15   Collene Gobble, MD  levothyroxine (SYNTHROID, LEVOTHROID) 25 MCG tablet 1.5 tablets every morning. Take one and one half tablet every morning 30 min. Before breakfast. 12/31/15   Historical Provider, MD  loratadine (CLARITIN) 10 MG tablet Take 10 mg by mouth daily. Reported on 01/03/2016    Historical Provider, MD  methocarbamol (ROBAXIN) 500 MG tablet Take 1 tablet (500 mg total) by mouth every 8 (eight) hours as needed for muscle spasms. 10/30/16   Carrie Mew, MD  Multiple Vitamins-Minerals (CENTRUM SILVER PO) Take 1 tablet by mouth daily.    Historical Provider, MD  Multiple Vitamins-Minerals (OCUVITE PRESERVISION PO) Take 1 tablet by mouth 2 (two) times daily.    Historical Provider, MD  propylthiouracil (PTU) 50 MG tablet Take 100 mg by mouth every morning.    Historical Provider, MD  sitaGLIPtan-metformin (JANUMET) 50-500 MG per tablet Take 1 tablet by mouth 2 (two) times daily with a meal.    Historical Provider, MD     Allergies Iohexol and Iodine   Family History  Problem Relation Age of Onset  . Stroke Mother   . Hypertension Mother   . Heart disease Father   . Heart attack Father   . Cancer Brother   . Hypertension Brother     Social History Social History  Substance Use Topics  . Smoking status: Former Smoker    Packs/day: 1.00    Years: 63.00    Types: Cigarettes    Quit date: 04/09/2004  . Smokeless tobacco: Former Systems developer     Comment: Quit smoking in 2005  . Alcohol use No    Review of Systems  Constitutional:   No fever or chills.  ENT:   No sore throat. No rhinorrhea. Cardiovascular:   No chest pain. Respiratory:    No dyspnea or cough. Gastrointestinal:   Negative for abdominal pain, vomiting and diarrhea.  Genitourinary:   Negative for dysuria or difficulty urinating. Musculoskeletal:   Positive right lower back pain Neurological:   Negative for headaches 10-point ROS otherwise negative.  ____________________________________________   PHYSICAL EXAM:  VITAL SIGNS: ED Triage Vitals [10/30/16 0929]  Enc Vitals Group     BP (!) 182/91     Pulse Rate 83     Resp 20     Temp      Temp src      SpO2 97 %     Weight 165 lb (74.8 kg)  Height      Head Circumference      Peak Flow      Pain Score 10     Pain Loc      Pain Edu?      Excl. in [place]?     Vital signs reviewed, nursing assessments reviewed.   Constitutional:   Alert and oriented. Well appearing and in no distress. Eyes:   No scleral icterus. No conjunctival pallor. PERRL. EOMI.  No nystagmus. ENT   Head:   Normocephalic and atraumatic.   Nose:   No congestion/rhinnorhea. No septal hematoma   Mouth/Throat:   MMM, no pharyngeal erythema. No peritonsillar mass.    Neck:   No stridor. No SubQ emphysema. No meningismus. Hematological/Lymphatic/Immunilogical:   No cervical lymphadenopathy. Cardiovascular:   RRR. Symmetric bilateral radial and DP pulses.  No murmurs.  Respiratory:   Normal respiratory effort without tachypnea nor retractions. Breath sounds are clear and equal bilaterally. No wheezes/rales/rhonchi. Gastrointestinal:   Soft and nontender. Non distended. There is no CVA tenderness.  No rebound, rigidity, or guarding. Genitourinary:   deferred Musculoskeletal:   Nontender with normal range of motion in all extremities. No joint effusions.  No lower extremity tenderness.  No edema. Straight leg raise positive on the right at 30. No midline spinal tenderness Neurologic:   Normal speech and language.  CN 2-10 normal. Motor grossly intact. No gross focal neurologic deficits are appreciated.  Skin:    Skin is  warm, dry and intact. No rash noted.  No petechiae, purpura, or bullae.  ____________________________________________    LABS (pertinent positives/negatives) (all labs ordered are listed, but only abnormal results are displayed) Labs Reviewed - No data to display ____________________________________________   EKG    ____________________________________________    RADIOLOGY  X-ray right hip and pelvis unremarkable  ____________________________________________   PROCEDURES Procedures  ____________________________________________   INITIAL IMPRESSION / ASSESSMENT AND PLAN / ED COURSE  Pertinent labs & imaging results that were available during my care of the patient were reviewed by me and considered in my medical decision making (see chart for details).  Patient presents with recurrence of previous musculoskeletal pain. Good pulses in bilateral feet, not consistent with vascular pathology such as DVT or arterial occlusion. No evidence of any soft tissue infection bursitis or septic arthritis. Low suspicion for osteomyelitis cauda equina or epidural abscess. No red flags on exam. We'll discharge home, refill Robaxin, diclofenac gel. Follow up with primary care     Clinical Course    ____________________________________________   FINAL CLINICAL IMPRESSION(S) / ED DIAGNOSES  Final diagnoses:  Acute right-sided low back pain without sciatica      New Prescriptions   DICLOFENAC SODIUM (VOLTAREN) 1 % GEL    Apply 2 g topically 4 (four) times daily.   METHOCARBAMOL (ROBAXIN) 500 MG TABLET    Take 1 tablet (500 mg total) by mouth every 8 (eight) hours as needed for muscle spasms.     Portions of this note were generated with dragon dictation software. Dictation errors may occur despite best attempts at proofreading.    Carrie Mew, MD 10/30/16 [phone]

## 2016-10-30 NOTE — ED Triage Notes (Signed)
Pt to ed with c/o back pain and bilat hip pain.  Pt states hx of same but that pain is worse over the last few days. Denies new injury.  Pt states took muscle relaxer this am for same without relief.

## 2016-11-02 ENCOUNTER — Emergency Department: Payer: Medicare Other

## 2016-11-02 ENCOUNTER — Inpatient Hospital Stay
Admission: EM | Admit: 2016-11-02 | Discharge: 2016-11-03 | DRG: 192 | Disposition: A | Discharge status: Home Health | Payer: Medicare Other | Attending: Internal Medicine | Admitting: Internal Medicine

## 2016-11-02 ENCOUNTER — Encounter: Payer: Self-pay | Admitting: *Deleted

## 2016-11-02 DIAGNOSIS — J441 Chronic obstructive pulmonary disease with (acute) exacerbation: Secondary | ICD-10-CM | POA: Diagnosis not present

## 2016-11-02 DIAGNOSIS — R262 Difficulty in walking, not elsewhere classified: Secondary | ICD-10-CM

## 2016-11-02 DIAGNOSIS — R2681 Unsteadiness on feet: Secondary | ICD-10-CM

## 2016-11-02 DIAGNOSIS — R0602 Shortness of breath: Secondary | ICD-10-CM

## 2016-11-02 DIAGNOSIS — M549 Dorsalgia, unspecified: Secondary | ICD-10-CM

## 2016-11-02 LAB — CBC WITH DIFFERENTIAL/PLATELET
Basophils Absolute: 0 10*3/uL (ref 0–0.1)
Basophils Relative: 0 %
Eosinophils Absolute: 0.1 10*3/uL (ref 0–0.7)
Eosinophils Relative: 1 %
HCT: 46.4 % (ref 40.0–52.0)
Hemoglobin: 16.3 g/dL (ref 13.0–18.0)
Lymphocytes Relative: 4 %
Lymphs Abs: 0.4 10*3/uL — ABNORMAL LOW (ref 1.0–3.6)
MCH: 34.6 pg — ABNORMAL HIGH (ref 26.0–34.0)
MCHC: 35.2 g/dL (ref 32.0–36.0)
MCV: 98.3 fL (ref 80.0–100.0)
Monocytes Absolute: 1.3 10*3/uL — ABNORMAL HIGH (ref 0.2–1.0)
Monocytes Relative: 15 %
Neutro Abs: 7.1 10*3/uL — ABNORMAL HIGH (ref 1.4–6.5)
Neutrophils Relative %: 80 %
Platelets: 209 10*3/uL (ref 150–440)
RBC: 4.72 MIL/uL (ref 4.40–5.90)
RDW: 13.7 % (ref 11.5–14.5)
WBC: 8.9 10*3/uL (ref 3.8–10.6)

## 2016-11-02 LAB — COMPREHENSIVE METABOLIC PANEL
ALT: 18 U/L (ref 17–63)
AST: 40 U/L (ref 15–41)
Albumin: 4.4 g/dL (ref 3.5–5.0)
Alkaline Phosphatase: 46 U/L (ref 38–126)
Anion gap: 7 (ref 5–15)
BUN: 13 mg/dL (ref 6–20)
CO2: 28 mmol/L (ref 22–32)
Calcium: 9.7 mg/dL (ref 8.9–10.3)
Chloride: 96 mmol/L — ABNORMAL LOW (ref 101–111)
Creatinine, Ser: 1.26 mg/dL — ABNORMAL HIGH (ref 0.61–1.24)
GFR calc Af Amer: 56 mL/min — ABNORMAL LOW (ref >60)
GFR calc non Af Amer: 48 mL/min — ABNORMAL LOW (ref >60)
Glucose, Bld: 164 mg/dL — ABNORMAL HIGH (ref 65–99)
Potassium: 3.7 mmol/L (ref 3.5–5.1)
Sodium: 131 mmol/L — ABNORMAL LOW (ref 135–145)
Total Bilirubin: 1.4 mg/dL — ABNORMAL HIGH (ref 0.3–1.2)
Total Protein: 7 g/dL (ref 6.5–8.1)

## 2016-11-02 LAB — GLUCOSE, CAPILLARY
Glucose-Capillary: 239 mg/dL — ABNORMAL HIGH (ref 65–99)
Glucose-Capillary: 234 mg/dL — ABNORMAL HIGH (ref 65–99)

## 2016-11-02 MED ORDER — ADULT MULTIVITAMIN W/MINERALS CH
1.0000 | ORAL_TABLET | Freq: Every day | ORAL | Status: DC
  Administered 2016-11-03: 1 via ORAL
  Filled 2016-11-02: qty 1

## 2016-11-02 MED ORDER — TIOTROPIUM BROMIDE MONOHYDRATE 18 MCG IN CAPS
18.0000 ug | ORAL_CAPSULE | Freq: Every day | RESPIRATORY_TRACT | Status: DC
  Administered 2016-11-02: 22:00:00 18 ug via RESPIRATORY_TRACT
  Filled 2016-11-02: qty 5

## 2016-11-02 MED ORDER — LORATADINE 10 MG PO TABS: 10.0000 mg | ORAL_TABLET | Freq: Every day | ORAL | Status: DC

## 2016-11-02 MED ORDER — PREDNISONE 20 MG PO TABS
60.0000 mg | ORAL_TABLET | Freq: Once | ORAL | Status: AC
  Administered 2016-11-02: 60 mg via ORAL
  Filled 2016-11-02: qty 3

## 2016-11-02 MED ORDER — INSULIN ASPART 100 UNIT/ML ~~LOC~~ SOLN
0.0000 [IU] | Freq: Three times a day (TID) | SUBCUTANEOUS | Status: DC
Start: 2016-11-02 — End: 2016-11-03
  Administered 2016-11-02 – 2016-11-03 (×2): 3 [IU] via SUBCUTANEOUS
  Administered 2016-11-03: 08:00:00 9 [IU] via SUBCUTANEOUS
  Filled 2016-11-02 (×2): qty 3
  Filled 2016-11-02: qty 9

## 2016-11-02 MED ORDER — LEVOTHYROXINE SODIUM 25 MCG PO TABS
25.0000 ug | ORAL_TABLET | Freq: Every morning | ORAL | Status: DC
  Administered 2016-11-03: 08:00:00 25 ug via ORAL
  Filled 2016-11-02: qty 1

## 2016-11-02 MED ORDER — METHYLPREDNISOLONE SODIUM SUCC 125 MG IJ SOLR
60.0000 mg | Freq: Four times a day (QID) | INTRAMUSCULAR | Status: DC
  Administered 2016-11-02 – 2016-11-03 (×2): 60 mg via INTRAVENOUS
  Filled 2016-11-02 (×2): qty 2

## 2016-11-02 MED ORDER — GUAIFENESIN ER 600 MG PO TB12
600.0000 mg | ORAL_TABLET | Freq: Every day | ORAL | Status: DC
  Administered 2016-11-02 – 2016-11-03 (×2): 600 mg via ORAL
  Filled 2016-11-02 (×2): qty 1

## 2016-11-02 MED ORDER — SITAGLIPTIN PHOS-METFORMIN HCL 50-500 MG PO TABS: 1.0000 | ORAL_TABLET | Freq: Two times a day (BID) | ORAL | Status: DC

## 2016-11-02 MED ORDER — METHOCARBAMOL 500 MG PO TABS
500.0000 mg | ORAL_TABLET | Freq: Three times a day (TID) | ORAL | Status: DC | PRN
  Filled 2016-11-02: qty 1

## 2016-11-02 MED ORDER — CLOPIDOGREL BISULFATE 75 MG PO TABS
75.0000 mg | ORAL_TABLET | Freq: Every day | ORAL | Status: DC
  Administered 2016-11-03: 08:00:00 75 mg via ORAL
  Filled 2016-11-02: qty 1

## 2016-11-02 MED ORDER — PROPYLTHIOURACIL 50 MG PO TABS
100.0000 mg | ORAL_TABLET | ORAL | Status: DC
  Administered 2016-11-03: 06:00:00 100 mg via ORAL
  Filled 2016-11-02: qty 2

## 2016-11-02 MED ORDER — DICLOFENAC SODIUM 1 % TD GEL
2.0000 g | Freq: Four times a day (QID) | TRANSDERMAL | Status: DC
  Administered 2016-11-02 – 2016-11-03 (×3): 2 g via TOPICAL
  Filled 2016-11-02: qty 100

## 2016-11-02 MED ORDER — BENZONATATE 100 MG PO CAPS: 200.0000 mg | ORAL_CAPSULE | Freq: Three times a day (TID) | ORAL | Status: DC | PRN

## 2016-11-02 MED ORDER — METFORMIN HCL 500 MG PO TABS
500.0000 mg | ORAL_TABLET | Freq: Two times a day (BID) | ORAL | Status: DC
  Administered 2016-11-02 – 2016-11-03 (×2): 500 mg via ORAL
  Filled 2016-11-02 (×2): qty 1

## 2016-11-02 MED ORDER — LIDOCAINE 5 % EX PTCH
1.0000 | MEDICATED_PATCH | CUTANEOUS | Status: DC
  Administered 2016-11-02 – 2016-11-03 (×2): 1 via TRANSDERMAL
  Filled 2016-11-02 (×2): qty 1

## 2016-11-02 MED ORDER — LEVOFLOXACIN 500 MG PO TABS
500.0000 mg | ORAL_TABLET | Freq: Once | ORAL | Status: AC
  Administered 2016-11-02: 17:00:00 500 mg via ORAL
  Filled 2016-11-02: qty 1

## 2016-11-02 MED ORDER — HEPARIN SODIUM (PORCINE) 5000 UNIT/ML IJ SOLN
5000.0000 [IU] | Freq: Three times a day (TID) | INTRAMUSCULAR | Status: DC
  Administered 2016-11-02 – 2016-11-03 (×4): 5000 [IU] via SUBCUTANEOUS
  Filled 2016-11-02 (×4): qty 1

## 2016-11-02 MED ORDER — LINAGLIPTIN 5 MG PO TABS
5.0000 mg | ORAL_TABLET | Freq: Two times a day (BID) | ORAL | Status: DC
  Administered 2016-11-02 – 2016-11-03 (×2): 5 mg via ORAL
  Filled 2016-11-02 (×2): qty 1

## 2016-11-02 MED ORDER — HYDROCODONE-ACETAMINOPHEN 5-325 MG PO TABS
1.0000 | ORAL_TABLET | Freq: Once | ORAL | Status: AC
  Administered 2016-11-02: 1 via ORAL
  Filled 2016-11-02: qty 1

## 2016-11-02 MED ORDER — IPRATROPIUM-ALBUTEROL 0.5-2.5 (3) MG/3ML IN SOLN
3.0000 mL | RESPIRATORY_TRACT | Status: DC
  Administered 2016-11-02 – 2016-11-03 (×6): 3 mL via RESPIRATORY_TRACT
  Filled 2016-11-02 (×6): qty 3

## 2016-11-02 MED ORDER — IPRATROPIUM-ALBUTEROL 0.5-2.5 (3) MG/3ML IN SOLN
3.0000 mL | Freq: Once | RESPIRATORY_TRACT | Status: AC
  Administered 2016-11-02: 3 mL via RESPIRATORY_TRACT
  Filled 2016-11-02: qty 3

## 2016-11-02 MED ORDER — LEVOFLOXACIN 500 MG PO TABS: 250.0000 mg | ORAL_TABLET | Freq: Every day | ORAL | Status: DC

## 2016-11-02 NOTE — ED Notes (Signed)
Pt desat to 88% on RA; pt placed on 2L via [place]. MD informed.

## 2016-11-02 NOTE — ED Notes (Signed)
Patient transported to X-ray 

## 2016-11-02 NOTE — ED Notes (Signed)
Pt resting in bed, wife at bedside.

## 2016-11-02 NOTE — ED Provider Notes (Signed)
Time Seen: Approximately 1045  I have reviewed the triage notes  Chief Complaint: Shortness of Breath and Back Pain   History of Present Illness: Dylan Knight is a 82 y.o. male *who presents with low back pain without relief from priors some prescriptions that were given from a visit on December 22. He's also developed some shortness of breath and increasing productive cough. There's been no fever at home. No difficulty with bladder or bowel function describes sciatic type pain mainly in the right lower extremity. No focal weakness in the lower extremities. Smooth. Past Medical History:  Diagnosis Date  . Arthritis    left hip  . BPH (benign prostatic hyperplasia)   . Cancer [facility])    Skin cancers removed  . Carotid artery occlusion   . COPD (chronic obstructive pulmonary disease) ([place])   . Diabetes mellitus 2011   Border line  . Diabetes mellitus without complication ([place])   . Hyperlipidemia   . Hypertension   . Hypothyroidism   . Macular degeneration   . Peripheral arterial disease ([place]) 2002  . Peripheral vascular disease ([place])   . Peripheral vascular disease (HCC)    PAD  . Stroke ([place]) 2005  . Stroke [facility])    2005 - Dr Michael Boston - [place]    Patient Active Problem List   Diagnosis Date Noted  . Aftercare following surgery of the circulatory system, [place] 06/28/2013  . Cough 04/18/2013  . COPD (chronic obstructive pulmonary disease) ([place]) 11/22/2012  . Occlusion and stenosis of carotid artery without mention of cerebral infarction 06/29/2012  . Peripheral vascular disease, unspecified 12/30/2011    Past Surgical History:  Procedure Laterality Date  . ABDOMINAL AORTIC ANEURYSM REPAIR  2003   Aorto-left femoral-right iliac BPG by Dr. Scot Dock  . Aortic aneursym surgery     2003- Dr. Scot Dock  . BACK SURGERY     1960  . CAROTID ENDARTERECTOMY  Jan. 25,2007   Right CEA  . Carotid stenosis surgery on right     2007  . CARPAL TUNNEL RELEASE Left  06/02/2016   Procedure: LEFT CARPAL TUNNEL RELEASE;  Surgeon: Daryll Brod, MD;  Location: [place];  Service: Orthopedics;  Laterality: Left;  . EYE SURGERY     Catarct surgery and lens implant - bilateral  . PR VEIN BYPASS GRAFT,AORTO-FEM-POP  2005   Revision Right Fem-pop  . PR VEIN BYPASS GRAFT,AORTO-FEM-POP  2002   Right Fem-pop  . Right leg bypass     2002 -  and amputation of right fifth toe  . SPINE SURGERY  1960   lumbar spine  . [place], 2007   cervical spine  . TOE AMPUTATION  08-2001   right 5th toe amputation    Past Surgical History:  Procedure Laterality Date  . ABDOMINAL AORTIC ANEURYSM REPAIR  2003   Aorto-left femoral-right iliac BPG by Dr. Scot Dock  . Aortic aneursym surgery     2003- Dr. Scot Dock  . BACK SURGERY     1960  . CAROTID ENDARTERECTOMY  Jan. 25,2007   Right CEA  . Carotid stenosis surgery on right     2007  . CARPAL TUNNEL RELEASE Left 06/02/2016   Procedure: LEFT CARPAL TUNNEL RELEASE;  Surgeon: Daryll Brod, MD;  Location: [place];  Service: Orthopedics;  Laterality: Left;  . EYE SURGERY     Catarct surgery and lens implant - bilateral  . PR VEIN BYPASS GRAFT,AORTO-FEM-POP  2005  Revision Right Fem-pop  . PR VEIN BYPASS GRAFT,AORTO-FEM-POP  2002   Right Fem-pop  . Right leg bypass     2002 -  and amputation of right fifth toe  . SPINE SURGERY  1960   lumbar spine  . [address], 2007   cervical spine  . TOE AMPUTATION  08-2001   right 5th toe amputation    Current Outpatient Rx  . Order #: [id number] Class: Historical Med  . Order #: [id number] Class: Historical Med  . Order #: [id number] Class: Historical Med  . Order #: [id number] Class: Historical Med  . Order #: [id number] Class: Historical Med  . Order #: [id number] Class: Historical Med  . Order #: [id number] Class: Historical Med  . Order #: [id number] Class: Historical Med  . Order #: [id number] Class: Historical Med  . Order #: [id number] Class:  Historical Med  . Order #: [id number] Class: Normal  . Order #: [id number] Class: Normal  . Order #: [id number] Class: Print  . Order #: [id number] Class: Normal  . Order #: [id number] Class: Normal  . Order #: [id number] Class: Historical Med  . Order #: [id number] Class: Print    Allergies:  Iohexol and Iodine  Family History: Family History  Problem Relation Age of Onset  . Stroke Mother   . Hypertension Mother   . Heart disease Father   . Heart attack Father   . Cancer Brother   . Hypertension Brother     Social History: Social History  Substance Use Topics  . Smoking status: Former Smoker    Packs/day: 1.00    Years: 63.00    Types: Cigarettes    Quit date: 04/09/2004  . Smokeless tobacco: Former Systems developer     Comment: Quit smoking in 2005  . Alcohol use No     Review of Systems:   10 point review of systems was performed and was otherwise negative:  Constitutional: NoObjective fever Eyes: No visual disturbances ENT: No sore throat, ear pain Cardiac: No chest pain Respiratory: Increased shortness of breath with no audible wheezes at home Abdomen: No abdominal pain, no vomiting, No diarrhea Endocrine: No weight loss, No night sweats Extremities: No peripheral edema, cyanosis Skin: No rashes, easy bruising Neurologic: No focal weakness, trouble with speech or swollowing Urologic: No dysuria, Hematuria, or urinary frequency   Physical Exam:  ED Triage Vitals  Enc Vitals Group     BP 11/02/16 0959 140/64     Pulse Rate 11/02/16 0959 94     Resp 11/02/16 0959 17     Temp 11/02/16 0959 97.9 F (36.6 C)     Temp Source 11/02/16 0959 Oral     SpO2 11/02/16 0959 94 %     Weight 11/02/16 0959 160 lb (72.6 kg)     Height 11/02/16 0959 5\' 6"  (1.676 m)     Head Circumference --      Peak Flow --      Pain Score 11/02/16 1009 0     Pain Loc --      Pain Edu? --      Excl. in [place]? --     General: Awake , Alert , and Oriented times 3; GCS 15 No signs of respiratory distress  with occasional dry cough at the bedside Head: Normal cephalic , atraumatic Eyes: Pupils equal , round, reactive to light Nose/Throat: No nasal drainage, patent upper airway without erythema or exudate.  Neck: Supple, Full range of motion, No anterior adenopathy or palpable thyroid masses Lungs: Wheezing auscultated bilaterally and symmetrically at the apices without rales or  rhonchi Heart: Regular rate, regular rhythm without murmurs , gallops , or rubs Abdomen: Soft, non tender without rebound, guarding , or rigidity; bowel sounds positive and symmetric in all 4 quadrants. No organomegaly .        Extremities: 2 plus symmetric pulses. No edema, clubbing or cyanosis Neurologic: normal ambulation, Motor symmetric without deficits, sensory intact. Mild straight leg raise on the right Skin: warm, dry, no rashes   Labs:   All laboratory work was reviewed including any pertinent negatives or positives listed below:  Labs Reviewed  CBC WITH DIFFERENTIAL/PLATELET - Abnormal; Notable for the following:       Result Value   MCH 34.6 (*)    Neutro Abs 7.1 (*)    Lymphs Abs 0.4 (*)    Monocytes Absolute 1.3 (*)    All other components within normal limits  COMPREHENSIVE METABOLIC PANEL - Abnormal; Notable for the following:    Sodium 131 (*)    Chloride 96 (*)    Glucose, Bld 164 (*)    Creatinine, Ser 1.26 (*)    Total Bilirubin 1.4 (*)    GFR calc non Af Amer 48 (*)    GFR calc Af Amer 56 (*)    All other components within normal limits  Laboratory work was reviewed and showed no clinically significant abnormalities.   EKG:  ED ECG REPORT I, Daymon Larsen, the attending physician, personally viewed and interpreted this ECG.  Date: 11/02/2016 EKG Time: 1005 Rate: 93 Rhythm: normal sinus rhythm QRS Axis: normal Intervals: Left anterior fascicular block ST/T Wave abnormalities: normal Conduction Disturbances: none Narrative Interpretation: unremarkable No acute ischemic  changes   Radiology:  "Dg Chest 2 View  Result Date: 11/02/2016 CLINICAL DATA:  Shortness of breath. EXAM: CHEST  2 VIEW COMPARISON:  10/10/2013 FINDINGS: The patient is mildly rotated to the right. Cardiomediastinal silhouette is unchanged. The lungs remain mildly hyperinflated with mild interstitial coarsening consistent with underlying COPD. No confluent airspace opacity, edema, pleural effusion, or pneumothorax is identified. No acute osseous abnormality is seen. IMPRESSION: No active cardiopulmonary disease. Electronically Signed   By: Logan Bores M.D.   On: 11/02/2016 10:55   Dg Lumbar Spine 2-3 Views  Result Date: 11/02/2016 CLINICAL DATA:  Back pain. EXAM: LUMBAR SPINE - 2-3 VIEW COMPARISON:  Lumbar spine MRI 12/27/2013. CT abdomen and pelvis 07/03/2014. FINDINGS: There are 5 non rib-bearing lumbar type vertebrae. Chronic L1 superior endplate compression fracture demonstrates mild vertebral body height loss, unchanged. There is no evidence of new fracture. There is no listhesis. Advanced disc degeneration is again seen at L1-2, L3-4, and L4-5 with severe disc space narrowing, degenerative endplate sclerosis, and prominent marginal osteophytosis. Milder disc degeneration is present at L2-3 and L5-S1. Laminectomies are again noted L3 and L4. Aortic atherosclerosis is noted. IMPRESSION: 1. Advanced lumbar disc degeneration without evidence of acute osseous abnormality. 2. Chronic L1 compression fracture. 3. Aortic atherosclerosis. Electronically Signed   By: Logan Bores M.D.   On: 11/02/2016 11:00   Dg Hip Unilat W Or Wo Pelvis 2-3 Views Right  Result Date: 10/30/2016 CLINICAL DATA:  Hip pain.  No injury. EXAM: DG HIP (WITH OR WITHOUT PELVIS) 2-3V RIGHT COMPARISON:  No recent prior. FINDINGS: Degenerative changes lumbar spine and both hips. No evidence of fracture or dislocation. Aortoiliac atherosclerotic vascular disease. Surgical clips in the pelvis. IMPRESSION: 1.  Degenerative changes  lumbar spine and both hips. 2.  Aortoiliac atherosclerotic vascular disease. Electronically Signed   By: Marcello Moores  Register   On: 10/30/2016 10:25  "  I personally reviewed the radiologic studies   ED Course:  Patient's stay here was uneventful he received steroid therapy along with a DuoNeb. There is no evidence of community-acquired pneumonia. He has had episodes of hypoxia when over to get him up to ambulate. The patient's back pain was relieved with a lidocaine patch and some by mouth Norco Clinical Course      Final Clinical Impression: *  Final diagnoses:  SOB (shortness of breath)  COPD exacerbation (HCC)  Back pain with sciatica on the right   Plan: * Inpatient management            Daymon Larsen, MD 11/02/16 1419

## 2016-11-02 NOTE — H&P (Signed)
[facility] NAME: Cayle Bailey    MR#:  [id number]  DATE OF BIRTH:  June 01, 1924  DATE OF ADMISSION:  11/02/2016  PRIMARY CARE PHYSICIAN: Maximino Greenland, MD   REQUESTING/REFERRING PHYSICIAN: Marcelene Butte  CHIEF COMPLAINT:   Chief Complaint  Patient presents with  . Shortness of Breath  . Back Pain    HISTORY OF PRESENT ILLNESS: Hridaan Traw  is a 82 y.o. male with a known history of Arthritis, BPH, skin cancer, carotid artery occlusion, COPD, diabetes, hyperlipidemia, hypertension, hypothyroidism, macular degeneration, peripheral arterial disease, stroke- is having lower back pain and right thigh and leg pain for last few months. The pain was worse 3 days ago so he came to emergency room and after having negative x-rays he was sent home with some muscle relaxers and local cream to apply. His pain continued to get worse up to the level that now it is interfering with him able to walk also. He also started having some cough without any sputum, fever, chills. He is feeling short of breath, so came to emergency room. ER physician gave his admission for COPD exacerbation. Patient was noted hypoxic respiratory ER physician.  PAST MEDICAL HISTORY:   Past Medical History:  Diagnosis Date  . Arthritis    left hip  . BPH (benign prostatic hyperplasia)   . Cancer [facility])    Skin cancers removed  . Carotid artery occlusion   . COPD (chronic obstructive pulmonary disease) ([place])   . Diabetes mellitus 2011   Border line  . Diabetes mellitus without complication ([place])   . Hyperlipidemia   . Hypertension   . Hypothyroidism   . Macular degeneration   . Peripheral arterial disease ([place]) 2002  . Peripheral vascular disease ([place])   . Peripheral vascular disease (HCC)    PAD  . Stroke ([place]) 2005  . Stroke [facility])    2005 - Dr Michael [place] - [place]    PAST SURGICAL HISTORY: Past Surgical History:  Procedure Laterality Date  . ABDOMINAL  AORTIC ANEURYSM REPAIR  2003   Aorto-left femoral-right iliac BPG by Dr. Scot Dock  . Aortic aneursym surgery     2003- Dr. Scot Dock  . BACK SURGERY     1960  . CAROTID ENDARTERECTOMY  Jan. 25,2007   Right CEA  . Carotid stenosis surgery on right     2007  . CARPAL TUNNEL RELEASE Left 06/02/2016   Procedure: LEFT CARPAL TUNNEL RELEASE;  Surgeon: Daryll Brod, MD;  Location: [facility];  Service: Orthopedics;  Laterality: Left;  . EYE SURGERY     Catarct surgery and lens implant - bilateral  . PR VEIN BYPASS GRAFT,AORTO-FEM-POP  2005   Revision Right Fem-pop  . PR VEIN BYPASS GRAFT,AORTO-FEM-POP  2002   Right Fem-pop  . Right leg bypass     2002 -  and amputation of right fifth toe  . SPINE SURGERY  1960   lumbar spine  . [place], 2007   cervical spine  . TOE AMPUTATION  08-2001   right 5th toe amputation    SOCIAL HISTORY:  Social History  Substance Use Topics  . Smoking status: Former Smoker    Packs/day: 1.00    Years: 63.00    Types: Cigarettes    Quit date: 04/09/2004  . Smokeless tobacco: Former Systems developer     Comment: Quit smoking in 2005  . Alcohol use No    FAMILY HISTORY:  Family  History  Problem Relation Age of Onset  . Stroke Mother   . Hypertension Mother   . Heart disease Father   . Heart attack Father   . Cancer Brother   . Hypertension Brother     DRUG ALLERGIES:  Allergies  Allergen Reactions  . Iohexol Hives    Went away with benadryl   . Iodine Rash    REVIEW OF SYSTEMS:   CONSTITUTIONAL: No fever, fatigue or weakness.  EYES: No blurred or double vision.  EARS, NOSE, AND THROAT: No tinnitus or ear pain.  RESPIRATORY: Positive for cough, shortness of breath, wheezing , no hemoptysis.  CARDIOVASCULAR: No chest pain, orthopnea, edema.  GASTROINTESTINAL: No nausea, vomiting, diarrhea or abdominal pain.  GENITOURINARY: No dysuria, hematuria.  ENDOCRINE: No polyuria, nocturia,  HEMATOLOGY: No anemia, easy bruising or  bleeding SKIN: No rash or lesion. MUSCULOSKELETAL: Lower back pain and right hip and thigh pain.   NEUROLOGIC: No tingling, numbness, weakness.  PSYCHIATRY: No anxiety or depression.   MEDICATIONS AT HOME:  Prior to Admission medications   Medication Sig Start Date End Date Taking? Authorizing Provider  atenolol-chlorthalidone (TENORETIC) 50-25 MG per tablet 0.5 tablets daily.  04/24/13  Yes Historical Provider, MD  clopidogrel (PLAVIX) 75 MG tablet Take 75 mg by mouth daily.   Yes Historical Provider, MD  ezetimibe-simvastatin (VYTORIN) 10-40 MG per tablet Take 1 tablet by mouth at bedtime. Take Monday, Wednesday, and Friday in evening.   Yes Historical Provider, MD  guaiFENesin (MUCINEX) 600 MG 12 hr tablet Take 600 mg by mouth daily.   Yes Historical Provider, MD  levothyroxine (SYNTHROID, LEVOTHROID) 25 MCG tablet 1.5-2 tablets every morning. Take 1.5 tablet Monday-Friday, and 2 tabs on Saturday and Sunday. 12/31/15  Yes Historical Provider, MD  Multiple Vitamins-Minerals (CENTRUM SILVER PO) Take 1 tablet by mouth daily.   Yes Historical Provider, MD  Multiple Vitamins-Minerals (OCUVITE PRESERVISION PO) Take 1 tablet by mouth 2 (two) times daily.   Yes Historical Provider, MD  propylthiouracil (PTU) 50 MG tablet Take 100 mg by mouth every morning.   Yes Historical Provider, MD  sitaGLIPtan-metformin (JANUMET) 50-500 MG per tablet Take 1 tablet by mouth 2 (two) times daily with a meal.   Yes Historical Provider, MD  acetaminophen (TYLENOL) 325 MG tablet Take 650 mg by mouth as needed.    Historical Provider, MD  albuterol (PROVENTIL HFA;VENTOLIN HFA) 108 (90 BASE) MCG/ACT inhaler Inhale 2 puffs into the lungs every 6 (six) hours as needed for wheezing or shortness of breath. Patient not taking: Reported on 11/02/2016 07/16/15   Collene Gobble, MD  benzonatate (TESSALON) 100 MG capsule Take 2 capsules (200 mg total) by mouth 3 (three) times daily as needed for cough. Patient not taking: Reported  on 11/02/2016 12/17/15   Collene Gobble, MD  diclofenac sodium (VOLTAREN) 1 % GEL Apply 2 g topically 4 (four) times daily. Patient not taking: Reported on 11/02/2016 10/30/16   Carrie Mew, MD  ipratropium (ATROVENT) 0.02 % nebulizer solution Take 2.5 mLs (500 mcg total) by nebulization 4 (four) times daily. Patient not taking: Reported on 11/02/2016 04/03/14   Collene Gobble, MD  ipratropium-albuterol (DUONEB) 0.5-2.5 (3) MG/3ML SOLN Take 3 mLs by nebulization 2 (two) times daily. Patient not taking: Reported on 11/02/2016 07/16/15   Collene Gobble, MD  loratadine (CLARITIN) 10 MG tablet Take 10 mg by mouth daily. Reported on 01/03/2016    Historical Provider, MD  methocarbamol (ROBAXIN) 500 MG tablet Take 1 tablet (500  mg total) by mouth every 8 (eight) hours as needed for muscle spasms. Patient not taking: Reported on 11/02/2016 10/30/16   Carrie Mew, MD      PHYSICAL EXAMINATION:   VITAL SIGNS: Blood pressure 118/68, pulse 89, temperature 97.9 F (36.6 C), temperature source Oral, resp. rate 18, height 5\' 6"  (1.676 m), weight 72.6 kg (160 lb), SpO2 96 %.  GENERAL:  82 y.o.-year-old patient lying in the bed with no acute distress.  EYES: Pupils equal, round, reactive to light and accommodation. No scleral icterus. Extraocular muscles intact.  HEENT: Head atraumatic, normocephalic. Oropharynx and nasopharynx clear.  NECK:  Supple, no jugular venous distention. No thyroid enlargement, no tenderness.  LUNGS: Normal breath sounds bilaterally, some wheezing, no crepitation. No use of accessory muscles of respiration.  CARDIOVASCULAR: S1, S2 normal. No murmurs, rubs, or gallops.  ABDOMEN: Soft, nontender, nondistended. Bowel sounds present. No organomegaly or mass.  EXTREMITIES: No pedal edema, cyanosis, or clubbing.  NEUROLOGIC: Cranial nerves II through XII are intact. Muscle strength 4/5 in all extremities. Sensation intact. Gait not checked.  PSYCHIATRIC: The patient is alert and  oriented x 3.  SKIN: No obvious rash, lesion, or ulcer.   LABORATORY PANEL:   CBC  Recent Labs Lab 11/02/16 1021  WBC 8.9  HGB 16.3  HCT 46.4  PLT 209  MCV 98.3  MCH 34.6*  MCHC 35.2  RDW 13.7  LYMPHSABS 0.4*  MONOABS 1.3*  EOSABS 0.1  BASOSABS 0.0   ------------------------------------------------------------------------------------------------------------------  Chemistries   Recent Labs Lab 11/02/16 1021  NA 131*  K 3.7  CL 96*  CO2 28  GLUCOSE 164*  BUN 13  CREATININE 1.26*  CALCIUM 9.7  AST 40  ALT 18  ALKPHOS 46  BILITOT 1.4*   ------------------------------------------------------------------------------------------------------------------ estimated creatinine clearance is 35.2 mL/min (by C-G formula based on SCr of 1.26 mg/dL (H)). ------------------------------------------------------------------------------------------------------------------ No results for input(s): TSH, T4TOTAL, T3FREE, THYROIDAB in the last 72 hours.  Invalid input(s): FREET3   Coagulation profile No results for input(s): INR, PROTIME in the last 168 hours. ------------------------------------------------------------------------------------------------------------------- No results for input(s): DDIMER in the last 72 hours. -------------------------------------------------------------------------------------------------------------------  Cardiac Enzymes No results for input(s): CKMB, TROPONINI, MYOGLOBIN in the last 168 hours.  Invalid input(s): CK ------------------------------------------------------------------------------------------------------------------ Invalid input(s): POCBNP  ---------------------------------------------------------------------------------------------------------------  Urinalysis    Component Value Date/Time   COLORURINE Yellow 11/24/2014 1359   APPEARANCEUR Clear 11/24/2014 1359   LABSPEC 1.008 11/24/2014 1359   PHURINE 7.0 11/24/2014  1359   GLUCOSEU Negative 11/24/2014 1359   HGBUR Negative 11/24/2014 1359   BILIRUBINUR Negative 11/24/2014 1359   KETONESUR Negative 11/24/2014 1359   PROTEINUR Negative 11/24/2014 1359   NITRITE Negative 11/24/2014 1359   LEUKOCYTESUR Negative 11/24/2014 1359     RADIOLOGY: Dg Chest 2 View  Result Date: 11/02/2016 CLINICAL DATA:  Shortness of breath. EXAM: CHEST  2 VIEW COMPARISON:  10/10/2013 FINDINGS: The patient is mildly rotated to the right. Cardiomediastinal silhouette is unchanged. The lungs remain mildly hyperinflated with mild interstitial coarsening consistent with underlying COPD. No confluent airspace opacity, edema, pleural effusion, or pneumothorax is identified. No acute osseous abnormality is seen. IMPRESSION: No active cardiopulmonary disease. Electronically Signed   By: Logan Bores M.D.   On: 11/02/2016 10:55   Dg Lumbar Spine 2-3 Views  Result Date: 11/02/2016 CLINICAL DATA:  Back pain. EXAM: LUMBAR SPINE - 2-3 VIEW COMPARISON:  Lumbar spine MRI 12/27/2013. CT abdomen and pelvis 07/03/2014. FINDINGS: There are 5 non rib-bearing lumbar type vertebrae. Chronic L1 superior endplate compression  fracture demonstrates mild vertebral body height loss, unchanged. There is no evidence of new fracture. There is no listhesis. Advanced disc degeneration is again seen at L1-2, L3-4, and L4-5 with severe disc space narrowing, degenerative endplate sclerosis, and prominent marginal osteophytosis. Milder disc degeneration is present at L2-3 and L5-S1. Laminectomies are again noted L3 and L4. Aortic atherosclerosis is noted. IMPRESSION: 1. Advanced lumbar disc degeneration without evidence of acute osseous abnormality. 2. Chronic L1 compression fracture. 3. Aortic atherosclerosis. Electronically Signed   By: Logan Bores M.D.   On: 11/02/2016 11:00    EKG: Orders placed or performed during the hospital encounter of 11/02/16  . EKG 12-Lead  . EKG 12-Lead    IMPRESSION AND PLAN:  *  COPD exacerbation   IV steroid, nebulizer therapy.   Levaquin orally.  * Low back pain.   Get CT lumbar spine, and give muscle relaxants, get physical therapy evaluation.  * Diabetes   Continue home medication, continue insulin sliding scale coverage.  * Hypertension   Continue home medication.  * Peripheral arterial disease   Continue aspirin and Plavix.  All the records are reviewed and case discussed with ED provider. Management plans discussed with the patient, family and they are in agreement.  CODE STATUS: DO NOT RESUSCITATE Code Status History    This patient does not have a recorded code status. Please follow your organizational policy for patients in this situation.    Advance Directive Documentation   Flowsheet Row Most Recent Value  Type of Advance Directive  Healthcare Power of Attorney, Living will  Pre-existing out of facility DNR order (yellow form or pink MOST form)  No data  "MOST" Form in Place?  No data     Patient's wife was present in the room during my visit.  TOTAL TIME TAKING CARE OF THIS PATIENT: 50 minutes.    Vaughan Basta M.D on 11/02/2016   Between 7am to 6pm - Pager - [phone]  After 6pm go to [email or web address] - password [email or web address] [facility]  Office  [phone]  CC: Primary care physician; Maximino Greenland, MD   Note: This dictation was prepared with Dragon dictation along with smaller phrase technology. Any transcriptional errors that result from this process are unintentional.

## 2016-11-02 NOTE — ED Notes (Signed)
Pt assisted to bathroom, upon returning to bed pt SOB, o2 sat 87% on RA, pt encouraged to take deep breathes, breathing tx given

## 2016-11-02 NOTE — ED Notes (Signed)
Pt returned from xray, resting in bed in no acute distress 

## 2016-11-02 NOTE — ED Triage Notes (Signed)
Patient was seen on 12/22 for c/o back pain and sent home with muscle relaxers and topical cream. Patient denies relief. Patient c/o shortness of breath and cough and states he feels like he has pneumonia.

## 2016-11-02 NOTE — ED Notes (Signed)
Pt ambulated on RA, pt tachypenic, o2 sat 86%, 89% when returned to bed, MD aware, pt placed on 2 L [place]

## 2016-11-02 NOTE — Progress Notes (Signed)
ANTIBIOTIC CONSULT NOTE - INITIAL  Pharmacy Consult for Levaquin Indication: pneumonia  Allergies  Allergen Reactions  . Iohexol Hives    Went away with benadryl   . Iodine Rash    Patient Measurements: Height: 5\' 6"  (167.6 cm) Weight: 160 lb (72.6 kg) IBW/kg (Calculated) : 63.8 Adjusted Body Weight:   Vital Signs: Temp: 97.9 F (36.6 C) (12/25 0959) Temp Source: Oral (12/25 0959) BP: 128/65 (12/25 1530) Pulse Rate: 85 (12/25 1530) Intake/Output from previous day: No intake/output data recorded. Intake/Output from this shift: No intake/output data recorded.  Labs:  Recent Labs  11/02/16 1021  WBC 8.9  HGB 16.3  PLT 209  CREATININE 1.26*   Estimated Creatinine Clearance: 35.2 mL/min (by C-G formula based on SCr of 1.26 mg/dL (H)). No results for input(s): VANCOTROUGH, VANCOPEAK, VANCORANDOM, GENTTROUGH, GENTPEAK, GENTRANDOM, TOBRATROUGH, TOBRAPEAK, TOBRARND, AMIKACINPEAK, AMIKACINTROU, AMIKACIN in the last 72 hours.   Microbiology: No results found for this or any previous visit (from the past 720 hour(s)).  Medical History: Past Medical History:  Diagnosis Date  . Arthritis    left hip  . BPH (benign prostatic hyperplasia)   . Cancer [facility])    Skin cancers removed  . Carotid artery occlusion   . COPD (chronic obstructive pulmonary disease) ([place])   . Diabetes mellitus 2011   Border line  . Diabetes mellitus without complication ([place])   . Hyperlipidemia   . Hypertension   . Hypothyroidism   . Macular degeneration   . Peripheral arterial disease ([place]) 2002  . Peripheral vascular disease ([place])   . Peripheral vascular disease (HCC)    PAD  . Stroke ([place]) 2005  . Stroke [facility])    2005 - Dr Michael Boston - [place]    Medications:  Prescriptions Prior to Admission  Medication Sig Dispense Refill Last Dose  . atenolol-chlorthalidone (TENORETIC) 50-25 MG per tablet 0.5 tablets daily.    11/02/2016 at 0830  . clopidogrel (PLAVIX) 75 MG tablet Take  75 mg by mouth daily.   11/02/2016 at Unknown time  . ezetimibe-simvastatin (VYTORIN) 10-40 MG per tablet Take 1 tablet by mouth at bedtime. Take Monday, Wednesday, and Friday in evening.   Past Week at Unknown time  . guaiFENesin (MUCINEX) 600 MG 12 hr tablet Take 600 mg by mouth daily.   11/01/2016 at Unknown time  . levothyroxine (SYNTHROID, LEVOTHROID) 25 MCG tablet 1.5-2 tablets every morning. Take 1.5 tablet Monday-Friday, and 2 tabs on Saturday and Sunday.   11/02/2016 at 0830  . Multiple Vitamins-Minerals (CENTRUM SILVER PO) Take 1 tablet by mouth daily.   11/02/2016 at Unknown time  . Multiple Vitamins-Minerals (OCUVITE PRESERVISION PO) Take 1 tablet by mouth 2 (two) times daily.   11/02/2016 at Unknown time  . propylthiouracil (PTU) 50 MG tablet Take 100 mg by mouth every morning.   11/02/2016 at Unknown time  . sitaGLIPtan-metformin (JANUMET) 50-500 MG per tablet Take 1 tablet by mouth 2 (two) times daily with a meal.   11/02/2016 at 0830  . acetaminophen (TYLENOL) 325 MG tablet Take 650 mg by mouth as needed.   prn at prn  . albuterol (PROVENTIL HFA;VENTOLIN HFA) 108 (90 BASE) MCG/ACT inhaler Inhale 2 puffs into the lungs every 6 (six) hours as needed for wheezing or shortness of breath. (Patient not taking: Reported on 11/02/2016) 1 Inhaler 6 Not Taking at Unknown time  . benzonatate (TESSALON) 100 MG capsule Take 2 capsules (200 mg total) by mouth 3 (three) times daily as needed for  cough. (Patient not taking: Reported on 11/02/2016) 30 capsule 5 Not Taking at Unknown time  . diclofenac sodium (VOLTAREN) 1 % GEL Apply 2 g topically 4 (four) times daily. (Patient not taking: Reported on 11/02/2016) 100 g 0 Not Taking at Unknown time  . ipratropium (ATROVENT) 0.02 % nebulizer solution Take 2.5 mLs (500 mcg total) by nebulization 4 (four) times daily. (Patient not taking: Reported on 11/02/2016) 75 mL 6 Not Taking at Unknown time  . ipratropium-albuterol (DUONEB) 0.5-2.5 (3) MG/3ML SOLN Take  3 mLs by nebulization 2 (two) times daily. (Patient not taking: Reported on 11/02/2016) 360 mL 5 Not Taking at Unknown time  . loratadine (CLARITIN) 10 MG tablet Take 10 mg by mouth daily. Reported on 01/03/2016   Not Taking  . methocarbamol (ROBAXIN) 500 MG tablet Take 1 tablet (500 mg total) by mouth every 8 (eight) hours as needed for muscle spasms. (Patient not taking: Reported on 11/02/2016) 12 tablet 0 Completed Course at Unknown time   Assessment: CrCl = 35.2 ml/min   Goal of Therapy:  resolution of infection  Plan:  Expected duration 7 days with resolution of temperature and/or normalization of WBC   Levaquin 500 mg PO Q24H originally ordered.  Will adjust dose to  Levaquin 500 mg PO X 1 to be given on 12/25 @ 17:00 followed by  Levaquin 250 mg PO Q24H to start 12/26 @ 18:00.   Evagelia Knack D 11/02/2016,3:59 PM

## 2016-11-03 DIAGNOSIS — R0602 Shortness of breath: Secondary | ICD-10-CM | POA: Diagnosis not present

## 2016-11-03 DIAGNOSIS — J441 Chronic obstructive pulmonary disease with (acute) exacerbation: Secondary | ICD-10-CM | POA: Diagnosis not present

## 2016-11-03 LAB — BASIC METABOLIC PANEL
Anion gap: 14 (ref 5–15)
BUN: 17 mg/dL (ref 6–20)
CO2: 22 mmol/L (ref 22–32)
Calcium: 9.2 mg/dL (ref 8.9–10.3)
Chloride: 91 mmol/L — ABNORMAL LOW (ref 101–111)
Creatinine, Ser: 1.23 mg/dL (ref 0.61–1.24)
GFR calc Af Amer: 58 mL/min — ABNORMAL LOW (ref >60)
GFR calc non Af Amer: 50 mL/min — ABNORMAL LOW (ref >60)
Glucose, Bld: 237 mg/dL — ABNORMAL HIGH (ref 65–99)
Potassium: 3.5 mmol/L (ref 3.5–5.1)
Sodium: 127 mmol/L — ABNORMAL LOW (ref 135–145)

## 2016-11-03 LAB — CBC
HCT: 43.7 % (ref 40.0–52.0)
Hemoglobin: 15.3 g/dL (ref 13.0–18.0)
MCH: 34.6 pg — ABNORMAL HIGH (ref 26.0–34.0)
MCHC: 35 g/dL (ref 32.0–36.0)
MCV: 98.8 fL (ref 80.0–100.0)
Platelets: 203 10*3/uL (ref 150–440)
RBC: 4.42 MIL/uL (ref 4.40–5.90)
RDW: 13.7 % (ref 11.5–14.5)
WBC: 5.8 10*3/uL (ref 3.8–10.6)

## 2016-11-03 LAB — GLUCOSE, CAPILLARY
Glucose-Capillary: 371 mg/dL — ABNORMAL HIGH (ref 65–99)
Glucose-Capillary: 248 mg/dL — ABNORMAL HIGH (ref 65–99)

## 2016-11-03 MED ORDER — LEVOTHYROXINE SODIUM 25 MCG PO TABS: 25.0000 ug | ORAL_TABLET | Freq: Every day | ORAL | Status: DC

## 2016-11-03 MED ORDER — LIDOCAINE 5 % EX PTCH: 1.0000 | MEDICATED_PATCH | CUTANEOUS | 0 refills | Status: DC

## 2016-11-03 MED ORDER — PREDNISONE 10 MG PO TABS: ORAL_TABLET | ORAL | 0 refills | Status: DC

## 2016-11-03 MED ORDER — PREDNISONE 50 MG PO TABS: 50.0000 mg | ORAL_TABLET | Freq: Every day | ORAL | Status: DC

## 2016-11-03 NOTE — Care Management Important Message (Signed)
Important Message  Patient Details  Name: Dylan Knight MRN: [id number] Date of Birth: [birth year]   Medicare Important Message Given:  Yes    Shelbie Ammons, RN 11/03/2016, 9:17 AM

## 2016-11-03 NOTE — Progress Notes (Signed)
Received MD order to discharge patient to home with home health, reviewed home meds discharge instructions and prescriptions with patient and wife and both verbalized understanding discharged to home with wife by nursing in wheelchair

## 2016-11-03 NOTE — Care Management (Signed)
Admitted to this facility with the diagnosis of COPD. Lives with wife, [name] [phone]).  Last seen Dr. Tye Savoy 09/30/16. No home health. [facility] for rehabilitation in the past.  Outpatient physical therapy in the past. No home oxygen. Prescriptions are filled at [facility]. No falls. Fair appetite x 1 week. Takes care of all basic activities of daily living himself, doesn't drive. Physical therapy recommending home with home health and therapy. Discussed with wife. [place]. Will update Floydene Flock, [facility] representative.  Discharge to home today per Dr. Reginia Forts RN MSN CCM Care Management

## 2016-11-03 NOTE — Evaluation (Signed)
Physical Therapy Evaluation Patient Details Name: Dylan Knight MRN: [id number] DOB: [birth year] Today's Date: 11/03/2016   History of Present Illness  Pt is a 82 y/o M who has been having lower back pain and R thigh pain for the past few months.  3 days prior to this admission pt presented to the ED having negative x-rays he was sent home with some muscle relaxers and local cream to apply.  His pain worsened and was also having some SOB and cough so he returned to the ED and was admitted for COPD exacerbation.  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    Clinical Impression  Pt admitted with above diagnosis. Pt currently with functional limitations due to the deficits listed below (see PT Problem List). Dylan Knight presents with RLE pain that began ~1 month ago and has worsened since.  Recommending HHPT for follow up on this.  He ambulates with a rollator at baseline due to imbalance.  He ambulated 200 ft with rollator and SOB; however SpO2 remained at or above 92% on RA throughout session. Pt will benefit from skilled PT to increase their independence and safety with mobility to allow discharge to the venue listed below.      Follow Up Recommendations Home health PT    Equipment Recommendations  None recommended by PT    Recommendations for Other Services       Precautions / Restrictions Precautions Precautions: Fall Restrictions Weight Bearing Restrictions: No      Mobility  Bed Mobility               General bed mobility comments: Pt in bathroom upon PT arrival  Transfers Overall transfer level: Needs assistance Equipment used: Rolling walker (2 wheeled) Transfers: Sit to/from Stand Sit to Stand: Supervision         General transfer comment: Supervision for safety as pt with poor technique.  Cues to back up all the way to the chair prior to sitting.  Pt mildly  unsteady.  Ambulation/Gait Ambulation/Gait assistance: Supervision Ambulation Distance (Feet): 200 Feet Assistive device: Rolling walker (2 wheeled) Gait Pattern/deviations: Step-through pattern;Decreased stride length;Trunk flexed Gait velocity: decreased Gait velocity interpretation: Below normal speed for age/gender General Gait Details: Flexed trunk which pt temporarily improves following verbal cues for upright posture and forward gaze.  SOB with cues for pursed lip breathing but SpO2 remains at or above 92% on RA throughout session.  HR up to 115.  Stairs            Wheelchair Mobility    Modified Rankin (Stroke Patients Only)       Balance Overall balance assessment: Needs assistance Sitting-balance support: No upper extremity supported;Feet supported Sitting balance-Leahy Scale: Good     Standing balance support: No upper extremity supported;During functional activity Standing balance-Leahy Scale: Fair Standing balance comment: Pt able to stand statically without UE support but relies on RW for dynamic activity.                             Pertinent Vitals/Pain Pain Assessment: 0-10 Pain Score: 3  Pain Location: R ant thigh Pain Descriptors / Indicators: Aching;Heaviness (intermittent N/T) Pain Intervention(s): Limited activity within patient's tolerance;Monitored during session;Repositioned    Home Living Family/patient expects to be discharged to:: Private residence Living Arrangements: Spouse/significant other Available Help at Discharge: Family;Available 24 hours/day Type of Home: [place]  Access: Level entry     Home Layout: One level Home Equipment: Grab bars - toilet;Grab bars - tub/shower;Hand held shower head;Walker - 4 wheels;Cane - quad;Shower seat - built in      Prior Function Level of Independence: Independent with assistive device(s)         Comments: Is slower with dressing, bathing, but is independent with these.   Pt does not drive, wife does the driving.  Denies any falls in the past 6 months.  Pt ambualting with rollator at all times due to imbalance.  Pt not using O2 at home.  RLE onset of pain after ambulating 3 laps in hallway at home.     Hand Dominance   Dominant Hand: Right    Extremity/Trunk Assessment        Lower Extremity Assessment Lower Extremity Assessment: RLE deficits/detail RLE Deficits / Details: Pt reports pain down his R thigh for the past month.  Pt has h/o back surgery.   Strength WNL.  H/o R 5th toe amputation. RLE Sensation: decreased light touch (ant thigh down to toes intermittent N/T)    Cervical / Trunk Assessment Cervical / Trunk Assessment: Other exceptions Cervical / Trunk Exceptions: h/o back surgery  Communication   Communication: No difficulties  Cognition Arousal/Alertness: Awake/alert Behavior During Therapy: WFL for tasks assessed/performed Overall Cognitive Status: Within Functional Limits for tasks assessed                      General Comments General comments (skin integrity, edema, etc.): Pt got dressed at bedside at start of PT session.  Pt required assist for buttoning shirt which is not his baseline.  BUE tremor noted (R>L) which pt and wife report occurs each time he gets sick and is typically only temporary.    Exercises Other Exercises Other Exercises: Demonstration and cues for pursed lip breathing.  Mod cues to perform throughout session and to continue throughout the day.   Assessment/Plan    PT Assessment Patient needs continued PT services  PT Problem List Decreased activity tolerance;Decreased balance;Decreased coordination;Decreased knowledge of use of DME;Decreased safety awareness;Cardiopulmonary status limiting activity;Pain          PT Treatment Interventions DME instruction;Gait training;Functional mobility training;Therapeutic activities;Therapeutic exercise;Balance training;Neuromuscular  re-education;Patient/family education    PT Goals (Current goals can be found in the Care Plan section)  Acute Rehab PT Goals Patient Stated Goal: to go home, decreased pain PT Goal Formulation: With patient/family Time For Goal Achievement: 11/17/16 Potential to Achieve Goals: Good    Frequency Min 2X/week   Barriers to discharge        Co-evaluation               End of Session Equipment Utilized During Treatment: Gait belt Activity Tolerance: Patient tolerated treatment well;Patient limited by fatigue Patient left: in chair;with call bell/phone within reach;with chair alarm set;with family/visitor present Nurse Communication: Mobility status;Other (comment) (SpO2)         Time: [id number] PT Time Calculation (min) (ACUTE ONLY): 38 min   Charges:   PT Evaluation $PT Eval Low Complexity: 1 Procedure PT Treatments $Gait Training: 8-22 mins $Therapeutic Activity: 8-22 mins   PT G Codes:        Collie Siad PT, DPT 11/03/2016, 10:44 AM

## 2016-11-03 NOTE — Discharge Summary (Signed)
[facility] [place] NAME: Dylan Knight    MR#:  [id number]  DATE OF BIRTH:  12/29/1923  DATE OF ADMISSION:  11/02/2016 ADMITTING PHYSICIAN: Vaughan Basta, MD  DATE OF DISCHARGE: 11/03/16  PRIMARY CARE PHYSICIAN: Maximino Greenland, MD    ADMISSION DIAGNOSIS:  SOB (shortness of breath) [R06.02] COPD exacerbation (HCC) [J44.1]  DISCHARGE DIAGNOSIS:  Acute on chornic COPD flare Acute on chronic back pain due to DJD (on lumbar xrays)  SECONDARY DIAGNOSIS:   Past Medical History:  Diagnosis Date  . Arthritis    left hip  . BPH (benign prostatic hyperplasia)   . Cancer [facility])    Skin cancers removed  . Carotid artery occlusion   . COPD (chronic obstructive pulmonary disease) ([place])   . Diabetes mellitus 2011   Border line  . Diabetes mellitus without complication ([place])   . Hyperlipidemia   . Hypertension   . Hypothyroidism   . Macular degeneration   . Peripheral arterial disease ([place] ) 2002  . Peripheral vascular disease ([place])   . Peripheral vascular disease (HCC)    PAD  . Stroke ([place]) 2005  . Stroke [facility])    2005 - Dr Michael [place] - [place]    HOSPITAL COURSE:  Dylan Knight  is a 82 y.o. male with a known history of Arthritis, BPH, skin cancer, carotid artery occlusion, COPD, diabetes, hyperlipidemia, hypertension, hypothyroidism, macular degeneration, peripheral arterial disease, stroke- is having lower back pain and right thigh and leg pain for last few months. The pain was worse 3 days ago so he came to emergency room and after having negative x-rays he was sent home with some muscle relaxers and local cream to apply.  * COPD exacerbation   IV steroid, nebulizer therapy.---appears stable. sats >92% on RA Change to po steroids No need for abxs  * Low back pain.   acute on Chronic No h/o urine or bowel incontinence -pt has had surgeries in the past Sees ortho spine in [place] -recommended to  make appt -cont lidocaine patch, diclofenac creama nd po robaxin (pt says it helps) PT recommends HHPT  * Diabetes   Continue home medication, continue insulin sliding scale coverage.  * Hypertension   Continue home medication.  * Peripheral arterial disease   Continue aspirin and Plavix.  D/c home D/w pt and wife CONSULTS OBTAINED:    DRUG ALLERGIES:   Allergies  Allergen Reactions  . Iohexol Hives    Went away with benadryl   . Iodine Rash    DISCHARGE MEDICATIONS:   Current Discharge Medication List    START taking these medications   Details  lidocaine (LIDODERM) 5 % Place 1 patch onto the skin daily. Remove & Discard patch within 12 hours or as directed by MD Qty: 30 patch, Refills: 0    predniSONE (DELTASONE) 10 MG tablet Start 50 mg daily taper by 10 mg daily then stop Qty: 15 tablet, Refills: 0      CONTINUE these medications which have NOT CHANGED   Details  atenolol-chlorthalidone (TENORETIC) 50-25 MG per tablet 0.5 tablets daily.     clopidogrel (PLAVIX) 75 MG tablet Take 75 mg by mouth daily.    ezetimibe-simvastatin (VYTORIN) 10-40 MG per tablet Take 1 tablet by mouth at bedtime. Take Monday, Wednesday, and Friday in evening.    guaiFENesin (MUCINEX) 600 MG 12 hr tablet Take 600 mg by mouth daily.    levothyroxine (SYNTHROID, LEVOTHROID) 25 MCG tablet 1.5-2  tablets every morning. Take 1.5 tablet Monday-Friday, and 2 tabs on Saturday and Sunday.    !! Multiple Vitamins-Minerals (CENTRUM SILVER PO) Take 1 tablet by mouth daily.    !! Multiple Vitamins-Minerals (OCUVITE PRESERVISION PO) Take 1 tablet by mouth 2 (two) times daily.    sitaGLIPtan-metformin (JANUMET) 50-500 MG per tablet Take 1 tablet by mouth 2 (two) times daily with a meal.    acetaminophen (TYLENOL) 325 MG tablet Take 650 mg by mouth as needed.    albuterol (PROVENTIL HFA;VENTOLIN HFA) 108 (90 BASE) MCG/ACT inhaler Inhale 2 puffs into the lungs every 6 (six) hours as needed  for wheezing or shortness of breath. Qty: 1 Inhaler, Refills: 6    benzonatate (TESSALON) 100 MG capsule Take 2 capsules (200 mg total) by mouth 3 (three) times daily as needed for cough. Qty: 30 capsule, Refills: 5   Associated Diagnoses: Cough    diclofenac sodium (VOLTAREN) 1 % GEL Apply 2 g topically 4 (four) times daily. Qty: 100 g, Refills: 0    ipratropium (ATROVENT) 0.02 % nebulizer solution Take 2.5 mLs (500 mcg total) by nebulization 4 (four) times daily. Qty: 75 mL, Refills: 6    ipratropium-albuterol (DUONEB) 0.5-2.5 (3) MG/3ML SOLN Take 3 mLs by nebulization 2 (two) times daily. Qty: 360 mL, Refills: 5    loratadine (CLARITIN) 10 MG tablet Take 10 mg by mouth daily. Reported on 01/03/2016    methocarbamol (ROBAXIN) 500 MG tablet Take 1 tablet (500 mg total) by mouth every 8 (eight) hours as needed for muscle spasms. Qty: 12 tablet, Refills: 0     !! - Potential duplicate medications found. Please discuss with provider.      If you experience worsening of your admission symptoms, develop shortness of breath, life threatening emergency, suicidal or homicidal thoughts you must seek medical attention immediately by calling 911 or calling your MD immediately  if symptoms less severe.  You Must read complete instructions/literature along with all the possible adverse reactions/side effects for all the Medicines you take and that have been prescribed to you. Take any new Medicines after you have completely understood and accept all the possible adverse reactions/side effects.   Please note  You were cared for by a hospitalist during your hospital stay. If you have any questions about your discharge medications or the care you received while you were in the hospital after you are discharged, you can call the unit and asked to speak with the hospitalist on call if the hospitalist that took care of you is not available. Once you are discharged, your primary care physician will handle  any further medical issues. Please note that NO REFILLS for any discharge medications will be authorized once you are discharged, as it is imperative that you return to your primary care physician (or establish a relationship with a primary care physician if you do not have one) for your aftercare needs so that they can reassess your need for medications and monitor your lab values. Today   SUBJECTIVE    Back pain and right thigh pain VITAL SIGNS:  Blood pressure 130/63, pulse (!) 108, temperature 97.8 F (36.6 C), temperature source Oral, resp. rate 17, height 5\' 6" (1.676 m), weight 72.6 kg (160 lb), SpO2 98 %.  I/O:   Intake/Output Summary (Last 24 hours) at 11/03/16 1227 Last data filed at 11/03/16 1009  Gross per 24 hour  Intake              36 0 ml  Output              475 ml  Net             -115 ml    PHYSICAL EXAMINATION:  GENERAL:  82 y.o.-year-old patient lying in the bed with no acute distress.  EYES: Pupils equal, round, reactive to light and accommodation. No scleral icterus. Extraocular muscles intact.  HEENT: Head atraumatic, normocephalic. Oropharynx and nasopharynx clear.  NECK:  Supple, no jugular venous distention. No thyroid enlargement, no tenderness.  LUNGS: Normal breath sounds bilaterally, no wheezing, rales,rhonchi or crepitation. No use of accessory muscles of respiration.  CARDIOVASCULAR: S1, S2 normal. No murmurs, rubs, or gallops.  ABDOMEN: Soft, non-tender, non-distended. Bowel sounds present. No organomegaly or mass.  EXTREMITIES: No pedal edema, cyanosis, or clubbing.  NEUROLOGIC: Cranial nerves II through XII are intact. Muscle strength 5/5 in all extremities. Sensation intact. Gait not checked.  PSYCHIATRIC: The patient is alert and oriented x 3.  SKIN: No obvious rash, lesion, or ulcer.   DATA REVIEW:   CBC   Recent Labs Lab 11/03/16 0455  WBC 5.8  HGB 15.3  HCT 43.7  PLT 203    Chemistries   Recent Labs Lab 11/02/16 1021  11/03/16 0455  NA 131* 127*  K 3.7 3.5  CL 96* 91*  CO2 28 22  GLUCOSE 164* 237*  BUN 13 17  CREATININE 1.26* 1.23  CALCIUM 9.7 9.2  AST 40  --   ALT 18  --   ALKPHOS 46  --   BILITOT 1.4*  --     Microbiology Results   No results found for this or any previous visit (from the past 240 hour(s)).  RADIOLOGY:  Dg Chest 2 View  Result Date: 11/02/2016 CLINICAL DATA:  Shortness of breath. EXAM: CHEST  2 VIEW COMPARISON:  10/10/2013 FINDINGS: The patient is mildly rotated to the right. Cardiomediastinal silhouette is unchanged. The lungs remain mildly hyperinflated with mild interstitial coarsening consistent with underlying COPD. No confluent airspace opacity, edema, pleural effusion, or pneumothorax is identified. No acute osseous abnormality is seen. IMPRESSION: No active cardiopulmonary disease. Electronically Signed   By: Logan Bores M.D.   On: 11/02/2016 10:55   Dg Lumbar Spine 2-3 Views  Result Date: 11/02/2016 CLINICAL DATA:  Back pain. EXAM: LUMBAR SPINE - 2-3 VIEW COMPARISON:  Lumbar spine MRI 12/27/2013. CT abdomen and pelvis 07/03/2014. FINDINGS: There are 5 non rib-bearing lumbar type vertebrae. Chronic L1 superior endplate compression fracture demonstrates mild vertebral body height loss, unchanged. There is no evidence of new fracture. There is no listhesis. Advanced disc degeneration is again seen at L1-2, L3-4, and L4-5 with severe disc space narrowing, degenerative endplate sclerosis, and prominent marginal osteophytosis. Milder disc degeneration is present at L2-3 and L5-S1. Laminectomies are again noted L3 and L4. Aortic atherosclerosis is noted. IMPRESSION: 1. Advanced lumbar disc degeneration without evidence of acute osseous abnormality. 2. Chronic L1 compression fracture. 3. Aortic atherosclerosis. Electronically Signed   By: Logan Bores M.D.   On: 11/02/2016 11:00     Management plans discussed with the patient, family and they are in agreement.  CODE STATUS:      Code Status Orders        Start     Ordered   11/02/16 1602  Do not attempt resuscitation (DNR)  Continuous    Question Answer Comment  In the event of cardiac or respiratory ARREST Do not call a "code blue"   In the event  of cardiac or respiratory ARREST Do not perform Intubation, CPR, defibrillation or ACLS   In the event of cardiac or respiratory ARREST Use medication by any route, position, wound care, and other measures to relive pain and suffering. May use oxygen, suction and manual treatment of airway obstruction as needed for comfort.      11/02/16 1601    Code Status History    Date Active Date Inactive Code Status Order ID Comments User Context   This patient has a current code status but no historical code status.    Advance Directive Documentation   Flowsheet Row Most Recent Value  Type of Advance Directive  Healthcare Power of Attorney  Pre-existing out of facility DNR order (yellow form or pink MOST form)  No data  "MOST" Form in Place?  No data      TOTAL TIME TAKING CARE OF THIS PATIENT: 40 minutes.    Dylan Knight M.D on 11/03/2016 at 12:27 PM  Between 7am to 6pm - Pager - [phone] After 6pm go to [email or web address] - password [email or web address]  Office  [phone]  CC: Primary care physician; Maximino Greenland, MD

## 2016-11-03 NOTE — Progress Notes (Signed)
Inpatient Diabetes Program Recommendations  AACE/ADA: New Consensus Statement on Inpatient Glycemic Control (2015)  Target Ranges:  Prepandial:   less than 140 mg/dL      Peak postprandial:   less than 180 mg/dL (1-2 hours)      Critically ill patients:  140 - 180 mg/dL   Lab Results  Component Value Date   GLUCAP 248 (H) 11/03/2016   HGBA1C 6.6 (H) 10/12/2012   Results for Dylan Knight, Dylan Knight (MRN [id number]) as of 11/03/2016 11:44  Ref. Range 06/02/2016 11:33 11/02/2016 16:55 11/02/2016 21:12 11/03/2016 07:35 11/03/2016 11:29  Glucose-Capillary Latest Ref Range: 65 - 99 mg/dL 157 (H) 239 (H) 234 (H) 371 (H) 248 (H)   Review of Glycemic Control  Diabetes history:     DM, Steroids this admission, Creatinine=1.23 Outpatient Diabetes medications:     Janumet 50-500 mg BID Current orders for Inpatient glycemic control:     Metformin 500 mg BID, Tradjenta 5 mg BID  Inpatient Diabetes Program Recommendations:    Please consider Novolog 0-9 units TIDAC sensitive correction while CBG's are elevated after steroids.  Thank you,  Windy Carina, RN, MSN Diabetes Coordinator Inpatient Diabetes Program [phone] (Team Pager)

## 2016-11-16 ENCOUNTER — Telehealth (INDEPENDENT_AMBULATORY_CARE_PROVIDER_SITE_OTHER): Payer: Self-pay | Admitting: Radiology

## 2016-11-16 NOTE — Telephone Encounter (Signed)
Stacy called, she is [facility] PT, and she is there to visit patient now.  Patient is having severe LBP right sided and right leg radiculopathy.  He has been to ER x2 and they have referred him for PT.  She cannot do HHPT that was ordered because he is in so much pain.  She is requesting that he have a workin appt soon for eval.  You can call her or the patient to schedule.

## 2016-11-16 NOTE — Telephone Encounter (Signed)
Can you please put patient in for 1:00pm tomorrow afternoon? Thanks.

## 2016-11-17 ENCOUNTER — Ambulatory Visit (INDEPENDENT_AMBULATORY_CARE_PROVIDER_SITE_OTHER): Payer: Medicare Other | Admitting: Orthopaedic Surgery

## 2016-11-17 ENCOUNTER — Encounter (INDEPENDENT_AMBULATORY_CARE_PROVIDER_SITE_OTHER): Payer: Self-pay | Admitting: Orthopaedic Surgery

## 2016-11-17 VITALS — BP 129/73 | HR 76 | Ht 66.0 in | Wt 165.0 lb

## 2016-11-17 DIAGNOSIS — M545 Low back pain, unspecified: Secondary | ICD-10-CM

## 2016-11-17 DIAGNOSIS — G8929 Other chronic pain: Secondary | ICD-10-CM | POA: Diagnosis not present

## 2016-11-17 NOTE — Progress Notes (Signed)
Office Visit Note   Patient: Dylan Knight           Date of Birth: 07-30-1924           MRN: [id number] Visit Date: 11/17/2016              Requested by: Glendale Chard, MD [address] [place], [place] [address] PCP: Maximino Greenland, MD   Assessment & Plan: Visit Diagnoses:  1. Chronic right-sided low back pain without sciatica     Plan: Patient's had multiple trips to the ER. Recent workup showed no evidence of new stroke.  CVA was in 2005 with extensive therapy for many weeks after his CVA. He can only stand about 60 seconds is using a rolling walker he has had no recent falls. Pain he rates as severe and intermittent episodic.  We'll proceed with a lumbar MRI with and without contrast to rule out the disc herniation on the right. He had a be met 1226 with creatinine 1.23 and BUN of 17 which is stable. Follow-Up Instructions: No Follow-up on file.   Orders:  No orders of the defined types were placed in this encounter.  No orders of the defined types were placed in this encounter.     Procedures: No procedures performed   Clinical Data: No additional findings.   Subjective: Chief Complaint  Patient presents with  . Lower Back - Pain    Patient has recently been seen in the ED for back pain. He states that it is worse after walking and the pain is low back and radiates into his right buttocks and leg. He denies numbness or tingling. He takes aleve with no relief. We received a call from [facility] who was out to see the patient and could not perform PT due to the amount of pain the patient was in. The recommended patient be worked in as soon as possible.    Review of Systems  Constitutional: Negative for chills and diaphoresis.  HENT: Negative for ear discharge, ear pain and nosebleeds.   Eyes: Negative for discharge and visual disturbance.  Respiratory: Negative for cough, choking and shortness of breath.   Cardiovascular: Negative for chest pain and  palpitations.       Patient has some decreased the arterial supply the left leg with Doppler at 0.66 and his right side which is symptomatic leg currently is 1.03 which is adequate.  Gastrointestinal: Negative for abdominal distention and abdominal pain.  Endocrine: Negative for cold intolerance and heat intolerance.  Genitourinary: Negative for flank pain and hematuria.  Musculoskeletal:       Previous lumbar decompressions L3-S1 midline, posterior  Skin: Negative for rash and wound.  Neurological: Negative for seizures and speech difficulty.       Previous CVA 2005, patient had the stent placed in his neck. He's had the carotid endarterectomy with patch done by Dr. Doren Custard  Hematological: Negative for adenopathy. Does not bruise/bleed easily.  Psychiatric/Behavioral: Negative for agitation and suicidal ideas.     Objective: Vital Signs: BP 129/73   Pulse 76   Ht '5\' 6"'  (1.676 m)   Wt 165 lb (74.8 kg)   BMI 26.63 kg/m   Physical Exam  Constitutional: He is oriented to person, place, and time. He appears well-developed and well-nourished.  HENT:  Head: Normocephalic and atraumatic.  Eyes: EOM are normal. Pupils are equal, round, and reactive to light.  Neck: No tracheal deviation present. No thyromegaly present.  Cardiovascular: Normal rate.  Pulmonary/Chest: Effort normal. He has no wheezes.  Abdominal: Soft. Bowel sounds are normal.  Musculoskeletal:  Patient has palpable pulses in the right leg. Decreased pulses left leg. No pain with hip range of motion. He's has the negative straight leg raising 90. Knee has full extension. Greater trochanter is mildly tender has some sciatic notch tenderness on the right none on the left. Negative Faber test. No knee effusion. Digits finger show minimal degenerative changes DIP joint consistent with osteoarthritis. Sensation to his foot is normal.  Neurological: He is alert and oriented to person, place, and time.  Skin: Skin is warm and dry.  Capillary refill takes less than 2 seconds.  Psychiatric: He has a normal mood and affect. His behavior is normal. Judgment and thought content normal.    Ortho Exam  Specialty Comments:  No specialty comments available.  Imaging: No results found.   PMFS History: Patient Active Problem List   Diagnosis Date Noted  . COPD exacerbation ([place]) 11/02/2016  . Back pain 11/02/2016  . Aftercare following surgery of the circulatory system, [place] 06/28/2013  . Cough 04/18/2013  . COPD (chronic obstructive pulmonary disease) ([place]) 11/22/2012  . Occlusion and stenosis of carotid artery without mention of cerebral infarction 06/29/2012  . Peripheral vascular disease, unspecified 12/30/2011   Past Medical History:  Diagnosis Date  . Arthritis    left hip  . BPH (benign prostatic hyperplasia)   . Cancer [facility])    Skin cancers removed  . Carotid artery occlusion   . COPD (chronic obstructive pulmonary disease) ([place])   . Diabetes mellitus 2011   Border line  . Diabetes mellitus without complication ([place])   . Hyperlipidemia   . Hypertension   . Hypothyroidism   . Macular degeneration   . Peripheral arterial disease ([place]) 2002  . Peripheral vascular disease ([place])   . Peripheral vascular disease (HCC)    PAD  . Stroke ([place]) 2005  . Stroke [facility])    2005 - Dr Michael Boston - [place]    Family History  Problem Relation Age of Onset  . Stroke Mother   . Hypertension Mother   . Heart disease Father   . Heart attack Father   . Cancer Brother   . Hypertension Brother     Past Surgical History:  Procedure Laterality Date  . ABDOMINAL AORTIC ANEURYSM REPAIR  2003   Aorto-left femoral-right iliac BPG by Dr. Scot Dock  . Aortic aneursym surgery     2003- Dr. Scot Dock  . BACK SURGERY     1960  . CAROTID ENDARTERECTOMY  Jan. 25,2007   Right CEA  . Carotid stenosis surgery on right     2007  . CARPAL TUNNEL RELEASE Left 06/02/2016   Procedure: LEFT CARPAL TUNNEL RELEASE;  Surgeon:  Daryll Brod, MD;  Location: [facility];  Service: Orthopedics;  Laterality: Left;  . EYE SURGERY     Catarct surgery and lens implant - bilateral  . PR VEIN BYPASS GRAFT,AORTO-FEM-POP  2005   Revision Right Fem-pop  . PR VEIN BYPASS GRAFT,AORTO-FEM-POP  2002   Right Fem-pop  . Right leg bypass     2002 -  and amputation of right fifth toe  . SPINE SURGERY  1960   lumbar spine  . [place], 2007   cervical spine  . TOE AMPUTATION  08-2001   right 5th toe amputation   Social History   Occupational History  . Not on file.   Social  History Main Topics  . Smoking status: Former Smoker    Packs/day: 1.00    Years: 63.00    Types: Cigarettes    Quit date: 04/09/2004  . Smokeless tobacco: Former Systems developer     Comment: Quit smoking in 2005  . Alcohol use No  . Drug use: No  . Sexual activity: Not on file

## 2016-11-17 NOTE — Addendum Note (Signed)
Addended by: Meyer Cory on: 11/17/2016 02:04 PM   Modules accepted: Orders

## 2016-11-20 ENCOUNTER — Telehealth (INDEPENDENT_AMBULATORY_CARE_PROVIDER_SITE_OTHER): Payer: Self-pay | Admitting: Orthopaedic Surgery

## 2016-11-20 NOTE — Telephone Encounter (Signed)
Patient aware he can take 1-2 one hour before MRI

## 2016-11-20 NOTE — Telephone Encounter (Signed)
Patient's wife (nancy)  called  Advised patient is having MRI on 11/25/16 and need instructions when to take the Rx Valium. The number to contact [name] is [phone]

## 2016-11-25 ENCOUNTER — Other Ambulatory Visit: Payer: Self-pay

## 2016-11-26 ENCOUNTER — Encounter (INDEPENDENT_AMBULATORY_CARE_PROVIDER_SITE_OTHER): Payer: Self-pay | Admitting: Ophthalmology

## 2016-11-29 ENCOUNTER — Ambulatory Visit
Admission: RE | Admit: 2016-11-29 | Discharge: 2016-11-29 | Disposition: A | Discharge status: Home / Self Care | Payer: Medicare Other | Source: Ambulatory Visit | Attending: Orthopaedic Surgery | Admitting: Orthopaedic Surgery

## 2016-11-29 DIAGNOSIS — M545 Low back pain, unspecified: Secondary | ICD-10-CM

## 2016-11-29 DIAGNOSIS — G8929 Other chronic pain: Secondary | ICD-10-CM

## 2016-11-30 ENCOUNTER — Telehealth (INDEPENDENT_AMBULATORY_CARE_PROVIDER_SITE_OTHER): Payer: Self-pay | Admitting: Orthopaedic Surgery

## 2016-11-30 ENCOUNTER — Encounter (HOSPITAL_COMMUNITY): Payer: Self-pay

## 2016-11-30 ENCOUNTER — Emergency Department (HOSPITAL_COMMUNITY): Payer: Medicare Other

## 2016-11-30 ENCOUNTER — Inpatient Hospital Stay (HOSPITAL_COMMUNITY)
Admission: EM | Admit: 2016-11-30 | Discharge: 2016-12-04 | DRG: 552 | Disposition: A | Discharge status: Skilled Nursing Facility | Payer: Medicare Other | Attending: Internal Medicine | Admitting: Internal Medicine

## 2016-11-30 DIAGNOSIS — E118 Type 2 diabetes mellitus with unspecified complications: Secondary | ICD-10-CM

## 2016-11-30 DIAGNOSIS — M5441 Lumbago with sciatica, right side: Secondary | ICD-10-CM | POA: Diagnosis not present

## 2016-11-30 DIAGNOSIS — M545 Low back pain, unspecified: Secondary | ICD-10-CM | POA: Diagnosis present

## 2016-11-30 DIAGNOSIS — R339 Retention of urine, unspecified: Secondary | ICD-10-CM | POA: Diagnosis present

## 2016-11-30 DIAGNOSIS — I10 Essential (primary) hypertension: Secondary | ICD-10-CM | POA: Diagnosis present

## 2016-11-30 DIAGNOSIS — J441 Chronic obstructive pulmonary disease with (acute) exacerbation: Secondary | ICD-10-CM | POA: Diagnosis present

## 2016-11-30 DIAGNOSIS — Z85828 Personal history of other malignant neoplasm of skin: Secondary | ICD-10-CM

## 2016-11-30 DIAGNOSIS — J449 Chronic obstructive pulmonary disease, unspecified: Secondary | ICD-10-CM | POA: Diagnosis present

## 2016-11-30 DIAGNOSIS — E039 Hypothyroidism, unspecified: Secondary | ICD-10-CM | POA: Diagnosis present

## 2016-11-30 DIAGNOSIS — E871 Hypo-osmolality and hyponatremia: Secondary | ICD-10-CM | POA: Diagnosis present

## 2016-11-30 DIAGNOSIS — G8929 Other chronic pain: Secondary | ICD-10-CM | POA: Diagnosis present

## 2016-11-30 DIAGNOSIS — Z66 Do not resuscitate: Secondary | ICD-10-CM | POA: Diagnosis present

## 2016-11-30 DIAGNOSIS — E1151 Type 2 diabetes mellitus with diabetic peripheral angiopathy without gangrene: Secondary | ICD-10-CM | POA: Diagnosis present

## 2016-11-30 DIAGNOSIS — N401 Enlarged prostate with lower urinary tract symptoms: Secondary | ICD-10-CM | POA: Diagnosis present

## 2016-11-30 DIAGNOSIS — I2510 Atherosclerotic heart disease of native coronary artery without angina pectoris: Secondary | ICD-10-CM | POA: Diagnosis present

## 2016-11-30 DIAGNOSIS — N179 Acute kidney failure, unspecified: Secondary | ICD-10-CM

## 2016-11-30 DIAGNOSIS — Z823 Family history of stroke: Secondary | ICD-10-CM

## 2016-11-30 DIAGNOSIS — Z87891 Personal history of nicotine dependence: Secondary | ICD-10-CM

## 2016-11-30 DIAGNOSIS — I739 Peripheral vascular disease, unspecified: Secondary | ICD-10-CM | POA: Diagnosis present

## 2016-11-30 DIAGNOSIS — H3530 Unspecified macular degeneration: Secondary | ICD-10-CM | POA: Diagnosis present

## 2016-11-30 DIAGNOSIS — Z79899 Other long term (current) drug therapy: Secondary | ICD-10-CM

## 2016-11-30 DIAGNOSIS — Z8673 Personal history of transient ischemic attack (TIA), and cerebral infarction without residual deficits: Secondary | ICD-10-CM

## 2016-11-30 DIAGNOSIS — M549 Dorsalgia, unspecified: Secondary | ICD-10-CM

## 2016-11-30 DIAGNOSIS — E119 Type 2 diabetes mellitus without complications: Secondary | ICD-10-CM

## 2016-11-30 DIAGNOSIS — Z7902 Long term (current) use of antithrombotics/antiplatelets: Secondary | ICD-10-CM

## 2016-11-30 DIAGNOSIS — Z8249 Family history of ischemic heart disease and other diseases of the circulatory system: Secondary | ICD-10-CM

## 2016-11-30 DIAGNOSIS — Z91041 Radiographic dye allergy status: Secondary | ICD-10-CM

## 2016-11-30 DIAGNOSIS — K219 Gastro-esophageal reflux disease without esophagitis: Secondary | ICD-10-CM | POA: Diagnosis present

## 2016-11-30 DIAGNOSIS — E785 Hyperlipidemia, unspecified: Secondary | ICD-10-CM | POA: Diagnosis present

## 2016-11-30 DIAGNOSIS — M25551 Pain in right hip: Secondary | ICD-10-CM

## 2016-11-30 DIAGNOSIS — Z7984 Long term (current) use of oral hypoglycemic drugs: Secondary | ICD-10-CM

## 2016-11-30 DIAGNOSIS — Z8679 Personal history of other diseases of the circulatory system: Secondary | ICD-10-CM

## 2016-11-30 LAB — CBC WITH DIFFERENTIAL/PLATELET
Basophils Absolute: 0 10*3/uL (ref 0.0–0.1)
Basophils Relative: 0 %
Eosinophils Absolute: 0 10*3/uL (ref 0.0–0.7)
Eosinophils Relative: 0 %
HCT: 45.5 % (ref 39.0–52.0)
Hemoglobin: 16.2 g/dL (ref 13.0–17.0)
Lymphocytes Relative: 5 %
Lymphs Abs: 0.8 10*3/uL (ref 0.7–4.0)
MCH: 34.4 pg — ABNORMAL HIGH (ref 26.0–34.0)
MCHC: 35.6 g/dL (ref 30.0–36.0)
MCV: 96.6 fL (ref 78.0–100.0)
Monocytes Absolute: 1.6 10*3/uL — ABNORMAL HIGH (ref 0.1–1.0)
Monocytes Relative: 10 %
Neutro Abs: 14.2 10*3/uL — ABNORMAL HIGH (ref 1.7–7.7)
Neutrophils Relative %: 85 %
Platelets: 216 10*3/uL (ref 150–400)
RBC: 4.71 MIL/uL (ref 4.22–5.81)
RDW: 13.1 % (ref 11.5–15.5)
WBC: 16.6 10*3/uL — ABNORMAL HIGH (ref 4.0–10.5)

## 2016-11-30 LAB — BASIC METABOLIC PANEL
Anion gap: 13 (ref 5–15)
BUN: 14 mg/dL (ref 6–20)
CO2: 22 mmol/L (ref 22–32)
Calcium: 10.4 mg/dL — ABNORMAL HIGH (ref 8.9–10.3)
Chloride: 93 mmol/L — ABNORMAL LOW (ref 101–111)
Creatinine, Ser: 1.08 mg/dL (ref 0.61–1.24)
GFR calc Af Amer: >60 mL/min (ref >60)
GFR calc non Af Amer: 58 mL/min — ABNORMAL LOW (ref >60)
Glucose, Bld: 134 mg/dL — ABNORMAL HIGH (ref 65–99)
Potassium: 4.3 mmol/L (ref 3.5–5.1)
Sodium: 128 mmol/L — ABNORMAL LOW (ref 135–145)

## 2016-11-30 LAB — URINALYSIS, ROUTINE W REFLEX MICROSCOPIC
Bilirubin Urine: NEGATIVE
Glucose, UA: NEGATIVE mg/dL
Hgb urine dipstick: NEGATIVE
Ketones, ur: NEGATIVE mg/dL
Leukocytes, UA: NEGATIVE
Nitrite: NEGATIVE
Protein, ur: NEGATIVE mg/dL
Specific Gravity, Urine: 1.013 (ref 1.005–1.030)
pH: 7 (ref 5.0–8.0)

## 2016-11-30 MED ORDER — HYDROMORPHONE HCL 2 MG/ML IJ SOLN
1.0000 mg | Freq: Once | INTRAMUSCULAR | Status: AC | PRN
  Administered 2016-12-01: 1 mg via INTRAVENOUS
  Filled 2016-11-30: qty 1

## 2016-11-30 MED ORDER — FENTANYL CITRATE (PF) 100 MCG/2ML IJ SOLN
50.0000 ug | Freq: Once | INTRAMUSCULAR | Status: AC
  Administered 2016-11-30: 50 ug via INTRAVENOUS
  Filled 2016-11-30: qty 2

## 2016-11-30 MED ORDER — HYDROMORPHONE HCL 2 MG/ML IJ SOLN
1.0000 mg | Freq: Once | INTRAMUSCULAR | Status: AC
  Administered 2016-11-30: 1 mg via INTRAVENOUS
  Filled 2016-11-30: qty 1

## 2016-11-30 NOTE — Telephone Encounter (Signed)
Patients wife states he was not able to have mri on Sunday, due to the fact he could not lay down. They was told to contact Dr Lorin Mercy office this morning.

## 2016-11-30 NOTE — ED Provider Notes (Signed)
Multiple visits for acute on chronic right sided back pain Has seen ortho - attempted MRI but unscucessful Today with urinary retention Low suspicion for cauda equina  Plan: if MRI unable to be obtained, admit internal medicine for pain control If MRI obtained and negative - re-evaluate to determine if he can be discharged home.   1:45 - patient is unable to tolerate MRI despite aggressive attempts at pain control.   3:45: Discussed with hospitalist who accepts the patient onto his service, admission for pain control and to re-attempt MRI under conscious sedation.   Charlann Lange, PA-C 12/02/16 [place], MD 12/04/16 1016

## 2016-11-30 NOTE — ED Triage Notes (Signed)
Lower back pain radiates to right leg x 1 month. was scheduled to have MRI completed with sedation but canceled d/t the snow. Pt

## 2016-11-30 NOTE — ED Notes (Signed)
Bladder scan 438cc

## 2016-11-30 NOTE — Telephone Encounter (Signed)
Order MRI under conscious sedation at [facility] , as recommended by [facility].   ROV after scan. I called and talked to his wife. thanks

## 2016-11-30 NOTE — ED Provider Notes (Signed)
Emergency Department Provider Note   I have reviewed the triage vital signs and the nursing notes.   HISTORY  Chief Complaint Back Pain   HPI Dylan Knight is a 83 y.o. male with PMH of BPH, COPD, HLD, HTN, PAD, intermittent spine pain, and h/o CVA resents to the emergency department for evaluation of new onset urinary retention in the setting of intermittent lower back pain radiating to the right leg. The patient has been seen in the emergency department multiple times for similar symptoms. He is following with his outpatient orthopedist to has scheduled him for MRI. The patient missed the initial study because of snow and then yesterday was unable to lie flat for the scan. Family states that they're arranging a sedation MRI and waiting for insurance approval. Today the patient is having improved pain symptoms but has not been able to urinate since this morning. He has not had a bowel movement either. No history of similar urinary retention. No fevers or chills. No new injury. He denies any weakness or numbness in his lower extremities.    Past Medical History:  Diagnosis Date  . Arthritis    left hip  . BPH (benign prostatic hyperplasia)   . Cancer [facility])    Skin cancers removed  . Carotid artery occlusion   . COPD (chronic obstructive pulmonary disease) ([place])   . Diabetes mellitus 2011   Border line  . Diabetes mellitus without complication ([place])   . Hyperlipidemia   . Hypertension   . Hypothyroidism   . Macular degeneration   . Peripheral arterial disease ([place]) 2002  . Peripheral vascular disease ([place])   . Peripheral vascular disease (HCC)    PAD  . Stroke ([place]) 2005  . Stroke [facility])    2005 - Dr Michael Boston - [place]    Patient Active Problem List   Diagnosis Date Noted  . Low back pain with right-sided sciatica 12/01/2016  . Diabetes mellitus type 2, controlled ([place]) 12/01/2016  . CAD (coronary artery disease) 12/01/2016  . Hypothyroidism 12/01/2016    . Low back pain 12/01/2016  . COPD exacerbation ([place]) 11/02/2016  . Back pain 11/02/2016  . Aftercare following surgery of the circulatory system, [place] 06/28/2013  . Cough 04/18/2013  . COPD (chronic obstructive pulmonary disease) ([place]) 11/22/2012  . Occlusion and stenosis of carotid artery without mention of cerebral infarction 06/29/2012  . Peripheral vascular disease ([place]) 12/30/2011    Past Surgical History:  Procedure Laterality Date  . ABDOMINAL AORTIC ANEURYSM REPAIR  2003   Aorto-left femoral-right iliac BPG by Dr. Scot Dock  . Aortic aneursym surgery     2003- Dr. Scot Dock  . BACK SURGERY     1960  . CAROTID ENDARTERECTOMY  Jan. 25,2007   Right CEA  . Carotid stenosis surgery on right     2007  . CARPAL TUNNEL RELEASE Left 06/02/2016   Procedure: LEFT CARPAL TUNNEL RELEASE;  Surgeon: Daryll Brod, MD;  Location: [facility];  Service: Orthopedics;  Laterality: Left;  . EYE SURGERY     Catarct surgery and lens implant - bilateral  . PR VEIN BYPASS GRAFT,AORTO-FEM-POP  2005   Revision Right Fem-pop  . PR VEIN BYPASS GRAFT,AORTO-FEM-POP  2002   Right Fem-pop  . Right leg bypass     2002 -  and amputation of right fifth toe  . SPINE SURGERY  1960   lumbar spine  . [place], 2007   cervical spine  .  TOE AMPUTATION  08-2001   right 5th toe amputation    Current Outpatient Rx  . Order #: [id number] Class: Historical Med  . Order #: [id number] Class: Normal  . Order #: [id number] Class: Historical Med  . Order #: [id number] Class: Historical Med  . Order #: [id number] Class: Historical Med  . Order #: [id number] Class: Historical Med  . Order #: [id number] Class: Historical Med  . Order #: [id number] Class: Historical Med  . Order #: [id number] Class: Historical Med  . Order #: [id number] Class: Historical Med  . Order #: [id number] Class: Historical Med    Allergies Iohexol and Iodine  Family History  Problem Relation Age of Onset  . Stroke Mother   . Hypertension  Mother   . Heart disease Father   . Heart attack Father   . Cancer Brother   . Hypertension Brother     Social History Social History  Substance Use Topics  . Smoking status: Former Smoker    Packs/day: 1.00    Years: 63.00    Types: Cigarettes    Quit date: 04/09/2004  . Smokeless tobacco: Former Systems developer     Comment: Quit smoking in 2005  . Alcohol use No    Review of Systems  Constitutional: No fever/chills Eyes: No visual changes. ENT: No sore throat. Cardiovascular: Denies chest pain. Respiratory: Denies shortness of breath. Gastrointestinal: No abdominal pain.  No nausea, no vomiting.  No diarrhea.  No constipation. Genitourinary: Negative for dysuria. Musculoskeletal: Positive for back pain and right leg pain.  Skin: Negative for rash. Neurological: Negative for headaches, focal weakness or numbness.  10-point ROS otherwise negative.  ____________________________________________   PHYSICAL EXAM:  VITAL SIGNS: ED Triage Vitals  Enc Vitals Group     BP 11/30/16 2040 119/72     Pulse Rate 11/30/16 2040 94     Resp 11/30/16 2040 18     Temp 11/30/16 2040 97.4 F (36.3 C)     Temp Source 11/30/16 2040 Oral     SpO2 11/30/16 2040 98 %     Weight 11/30/16 2038 165 lb (74.8 kg)     Height 11/30/16 2038 5\' 6"  (1.676 m)     Pain Score 11/30/16 2038 4   Constitutional: Alert and oriented. Well appearing and in no acute distress. Eyes: Conjunctivae are normal.  Head: Atraumatic. Nose: No congestion/rhinnorhea. Mouth/Throat: Mucous membranes are moist.  Oropharynx non-erythematous. Neck: No stridor.  Cardiovascular: Normal rate, regular rhythm. Good peripheral circulation. Grossly normal heart sounds.   Respiratory: Normal respiratory effort.  No retractions. Lungs CTAB. Gastrointestinal: Soft and nontender. No distention.  Musculoskeletal: No lower extremity tenderness nor edema. No gross deformities of extremities. Tenderness to palpation along the lower back and  RLE. Neurologic:  Normal speech and language. No gross focal neurologic deficits are appreciated.  Skin:  Skin is warm, dry and intact. No rash noted.   ____________________________________________   LABS (all labs ordered are listed, but only abnormal results are displayed)  Labs Reviewed  BASIC METABOLIC PANEL - Abnormal; Notable for the following:       Result Value   Sodium 128 (*)    Chloride 93 (*)    Glucose, Bld 134 (*)    Calcium 10.4 (*)    GFR calc non Af Amer 58 (*)    All other components within normal limits  CBC WITH DIFFERENTIAL/PLATELET - Abnormal; Notable for the following:    WBC 16.6 (*)    MCH 34.4 (*)    Neutro Abs 14.2 (*)    Monocytes Absolute  1.6 (*)    All other components within normal limits  BASIC METABOLIC PANEL - Abnormal; Notable for the following:    Sodium 128 (*)    Chloride 93 (*)    Glucose, Bld 229 (*)    Creatinine, Ser 1.32 (*)    GFR calc non Af Amer 46 (*)    GFR calc Af Amer 53 (*)    All other components within normal limits  CBC - Abnormal; Notable for the following:    WBC 14.9 (*)    MCH 34.7 (*)    All other components within normal limits  CBG MONITORING, ED - Abnormal; Notable for the following:    Glucose-Capillary 243 (*)    All other components within normal limits  URINALYSIS, ROUTINE W REFLEX MICROSCOPIC  OSMOLALITY  OSMOLALITY, URINE  SODIUM, URINE, RANDOM  URIC ACID  CREATININE, URINE, RANDOM  TROPONIN I  TROPONIN I  HEMOGLOBIN A1C   ____________________________________________  RADIOLOGY  MRI pending ____________________________________________   PROCEDURES  Procedure(s) performed:   Procedures  None ____________________________________________   INITIAL IMPRESSION / ASSESSMENT AND PLAN / ED COURSE  Pertinent labs & imaging results that were available during my care of the patient were reviewed by me and considered in my medical decision making (see chart for details).  Patient presents  to the emergency department for evaluation of back pain and new urinary retention starting today. Patient was unable to tolerate MRI recently because of pain. His pain symptoms are actually improved today. No neurological deficits on my exam. The patient's new urinary retention is concerning for possible spinal cord involvement despite the patient's relatively normal neurological exam. Still, my clinical suspicion for cauda equina is low. He is having improved pain symptoms today which may make MRI easier. Plan to obtain baseline labs to assess for obstructive uropathy and perform bedside ultrasound to evaluate bladder size and decide if Foley catheter needs to be placed.   11:30 PM Patient had severe pain with laying flat. Only mild retention after foley cath placement. Gave Dilaudid and will try MRI again.  ____________________________________________  FINAL CLINICAL IMPRESSION(S) / ED DIAGNOSES  Final diagnoses:  Back pain  Urinary retention     MEDICATIONS GIVEN DURING THIS VISIT:  Medications  clopidogrel (PLAVIX) tablet 75 mg (not administered)  guaiFENesin (MUCINEX) 12 hr tablet 600 mg (not administered)  albuterol (PROVENTIL) (2.5 MG/3ML) 0.083% nebulizer solution 2.5 mg (not administered)  ezetimibe-simvastatin (VYTORIN) 10-40 MG per tablet 1 tablet (not administered)  loratadine (CLARITIN) tablet 10 mg (not administered)  linagliptin (TRADJENTA) tablet 5 mg (not administered)  methocarbamol (ROBAXIN) 500 mg in dextrose 5 % 50 mL IVPB (not administered)  acetaminophen (TYLENOL) tablet 650 mg (not administered)    Or  acetaminophen (TYLENOL) suppository 650 mg (not administered)  ondansetron (ZOFRAN) injection 4 mg (not administered)  levothyroxine (SYNTHROID, LEVOTHROID) tablet 37.5 mcg (not administered)    And  levothyroxine (SYNTHROID, LEVOTHROID) tablet 50 mcg (not administered)  atenolol (TENORMIN) tablet 25 mg (not administered)  0.9 %  sodium chloride infusion (  Intravenous Rate/Dose Change 12/01/16 0953)  hydrALAZINE (APRESOLINE) injection 10 mg (not administered)  gi cocktail (Maalox,Lidocaine,Donnatal) (30 mLs Oral Given 12/01/16 0917)  HYDROcodone-acetaminophen (NORCO/VICODIN) 5-325 MG per tablet 1 tablet (not administered)  fentaNYL (SUBLIMAZE) injection 50 mcg (not administered)  HYDROmorphone (DILAUDID) injection 1 mg (not administered)  heparin injection 5,000 Units (5,000 Units Subcutaneous Given 12/01/16 1017)  tamsulosin (FLOMAX) capsule 0.4 mg (0.4 mg Oral Given 12/01/16 1017)  pantoprazole (PROTONIX) EC  tablet 40 mg (40 mg Oral Given 12/01/16 1017)  insulin aspart (novoLOG) injection 0-15 Units (not administered)  insulin aspart (novoLOG) injection 0-5 Units (not administered)  fentaNYL (SUBLIMAZE) injection 50 mcg (50 mcg Intravenous Given 11/30/16 2252)  HYDROmorphone (DILAUDID) injection 1 mg (1 mg Intravenous Given 11/30/16 2333)  HYDROmorphone (DILAUDID) injection 1 mg (1 mg Intravenous Given 12/01/16 0035)     NEW OUTPATIENT MEDICATIONS STARTED DURING THIS VISIT:  None   Note:  This document was prepared using Dragon voice recognition software and may include unintentional dictation errors.  Nanda Quinton, MD Emergency Medicine   Margette Fast, MD 12/01/16 1101

## 2016-11-30 NOTE — Telephone Encounter (Signed)
Order entered. Message sent to Gabriel Cirri so that she would be aware.

## 2016-11-30 NOTE — ED Notes (Signed)
Notified Dr. Laverta Baltimore pt not able to tolerate MRI. Additional orders for pain medication received and MRI notified

## 2016-11-30 NOTE — ED Notes (Signed)
Patient transported to MRI 

## 2016-11-30 NOTE — Telephone Encounter (Signed)
Please advise 

## 2016-12-01 ENCOUNTER — Ambulatory Visit (INDEPENDENT_AMBULATORY_CARE_PROVIDER_SITE_OTHER): Payer: Self-pay | Admitting: Orthopaedic Surgery

## 2016-12-01 ENCOUNTER — Observation Stay (HOSPITAL_COMMUNITY): Payer: Medicare Other

## 2016-12-01 ENCOUNTER — Encounter (HOSPITAL_COMMUNITY): Payer: Self-pay | Admitting: Internal Medicine

## 2016-12-01 DIAGNOSIS — M5441 Lumbago with sciatica, right side: Secondary | ICD-10-CM | POA: Diagnosis present

## 2016-12-01 DIAGNOSIS — E119 Type 2 diabetes mellitus without complications: Secondary | ICD-10-CM

## 2016-12-01 DIAGNOSIS — M545 Low back pain, unspecified: Secondary | ICD-10-CM | POA: Diagnosis present

## 2016-12-01 DIAGNOSIS — I2510 Atherosclerotic heart disease of native coronary artery without angina pectoris: Secondary | ICD-10-CM | POA: Diagnosis present

## 2016-12-01 DIAGNOSIS — E039 Hypothyroidism, unspecified: Secondary | ICD-10-CM | POA: Diagnosis not present

## 2016-12-01 DIAGNOSIS — J42 Unspecified chronic bronchitis: Secondary | ICD-10-CM | POA: Diagnosis not present

## 2016-12-01 LAB — GLUCOSE, CAPILLARY
Glucose-Capillary: 178 mg/dL — ABNORMAL HIGH (ref 65–99)
Glucose-Capillary: 154 mg/dL — ABNORMAL HIGH (ref 65–99)

## 2016-12-01 LAB — BASIC METABOLIC PANEL
Anion gap: 13 (ref 5–15)
BUN: 15 mg/dL (ref 6–20)
CO2: 22 mmol/L (ref 22–32)
Calcium: 9.8 mg/dL (ref 8.9–10.3)
Chloride: 93 mmol/L — ABNORMAL LOW (ref 101–111)
Creatinine, Ser: 1.32 mg/dL — ABNORMAL HIGH (ref 0.61–1.24)
GFR calc Af Amer: 53 mL/min — ABNORMAL LOW (ref >60)
GFR calc non Af Amer: 46 mL/min — ABNORMAL LOW (ref >60)
Glucose, Bld: 229 mg/dL — ABNORMAL HIGH (ref 65–99)
Potassium: 4 mmol/L (ref 3.5–5.1)
Sodium: 128 mmol/L — ABNORMAL LOW (ref 135–145)

## 2016-12-01 LAB — SODIUM, URINE, RANDOM: Sodium, Ur: 64 mmol/L

## 2016-12-01 LAB — CBC
HCT: 43.2 % (ref 39.0–52.0)
Hemoglobin: 15.5 g/dL (ref 13.0–17.0)
MCH: 34.7 pg — ABNORMAL HIGH (ref 26.0–34.0)
MCHC: 35.9 g/dL (ref 30.0–36.0)
MCV: 96.6 fL (ref 78.0–100.0)
Platelets: 210 10*3/uL (ref 150–400)
RBC: 4.47 MIL/uL (ref 4.22–5.81)
RDW: 13.3 % (ref 11.5–15.5)
WBC: 14.9 10*3/uL — ABNORMAL HIGH (ref 4.0–10.5)

## 2016-12-01 LAB — OSMOLALITY, URINE: Osmolality, Ur: 434 mOsm/kg (ref 300–900)

## 2016-12-01 LAB — TROPONIN I
Troponin I: <0.03 ng/mL (ref <0.03)
Troponin I: <0.03 ng/mL (ref <0.03)

## 2016-12-01 LAB — CBG MONITORING, ED
Glucose-Capillary: 243 mg/dL — ABNORMAL HIGH (ref 65–99)
Glucose-Capillary: 125 mg/dL — ABNORMAL HIGH (ref 65–99)

## 2016-12-01 LAB — OSMOLALITY: Osmolality: 277 mOsm/kg (ref 275–295)

## 2016-12-01 LAB — URIC ACID: Uric Acid, Serum: 4.8 mg/dL (ref 4.4–7.6)

## 2016-12-01 LAB — CREATININE, URINE, RANDOM: Creatinine, Urine: 98.75 mg/dL

## 2016-12-01 MED ORDER — ONDANSETRON HCL 4 MG/2ML IJ SOLN: 4.0000 mg | Freq: Four times a day (QID) | INTRAMUSCULAR | Status: DC | PRN

## 2016-12-01 MED ORDER — LEVOTHYROXINE SODIUM 50 MCG PO TABS
50.0000 ug | ORAL_TABLET | ORAL | Status: DC
  Filled 2016-12-01 (×3): qty 1

## 2016-12-01 MED ORDER — ATENOLOL-CHLORTHALIDONE 50-25 MG PO TABS: 0.5000 | ORAL_TABLET | Freq: Every day | ORAL | Status: DC

## 2016-12-01 MED ORDER — HEPARIN SODIUM (PORCINE) 5000 UNIT/ML IJ SOLN
5000.0000 [IU] | Freq: Three times a day (TID) | INTRAMUSCULAR | Status: DC
  Administered 2016-12-01 – 2016-12-04 (×8): 5000 [IU] via SUBCUTANEOUS
  Filled 2016-12-01 (×6): qty 1

## 2016-12-01 MED ORDER — DEXTROSE 5 % IV SOLN
500.0000 mg | Freq: Four times a day (QID) | INTRAVENOUS | Status: DC | PRN
  Administered 2016-12-01 – 2016-12-03 (×3): 500 mg via INTRAVENOUS
  Filled 2016-12-01 (×9): qty 5

## 2016-12-01 MED ORDER — GI COCKTAIL ~~LOC~~
30.0000 mL | Freq: Three times a day (TID) | ORAL | Status: DC
  Administered 2016-12-01 – 2016-12-04 (×9): 30 mL via ORAL
  Filled 2016-12-01 (×9): qty 30

## 2016-12-01 MED ORDER — GUAIFENESIN ER 600 MG PO TB12
600.0000 mg | ORAL_TABLET | Freq: Every evening | ORAL | Status: DC
  Administered 2016-12-01 – 2016-12-03 (×3): 600 mg via ORAL
  Filled 2016-12-01 (×3): qty 1

## 2016-12-01 MED ORDER — INSULIN ASPART 100 UNIT/ML ~~LOC~~ SOLN
0.0000 [IU] | Freq: Three times a day (TID) | SUBCUTANEOUS | Status: DC
  Administered 2016-12-01 – 2016-12-03 (×2): 2 [IU] via SUBCUTANEOUS
  Administered 2016-12-03: 3 [IU] via SUBCUTANEOUS
  Administered 2016-12-03: 8 [IU] via SUBCUTANEOUS
  Administered 2016-12-04: 2 [IU] via SUBCUTANEOUS
  Filled 2016-12-01: qty 1

## 2016-12-01 MED ORDER — FENTANYL CITRATE (PF) 100 MCG/2ML IJ SOLN
50.0000 ug | INTRAMUSCULAR | Status: DC | PRN
  Administered 2016-12-01: 50 ug via INTRAVENOUS
  Filled 2016-12-01: qty 2

## 2016-12-01 MED ORDER — SODIUM CHLORIDE 0.9 % IV SOLN
INTRAVENOUS | Status: AC
  Administered 2016-12-01: 50 mL via INTRAVENOUS
  Administered 2016-12-01 (×2): via INTRAVENOUS

## 2016-12-01 MED ORDER — PANTOPRAZOLE SODIUM 40 MG PO TBEC
40.0000 mg | DELAYED_RELEASE_TABLET | Freq: Every day | ORAL | Status: DC
  Administered 2016-12-01 – 2016-12-04 (×4): 40 mg via ORAL
  Filled 2016-12-01 (×5): qty 1

## 2016-12-01 MED ORDER — INSULIN ASPART 100 UNIT/ML ~~LOC~~ SOLN
0.0000 [IU] | Freq: Every day | SUBCUTANEOUS | Status: DC
  Administered 2016-12-02: 0 [IU] via SUBCUTANEOUS

## 2016-12-01 MED ORDER — EZETIMIBE-SIMVASTATIN 10-40 MG PO TABS
1.0000 | ORAL_TABLET | ORAL | Status: DC
  Administered 2016-12-02: 1 via ORAL
  Filled 2016-12-01 (×2): qty 1

## 2016-12-01 MED ORDER — HYDROCODONE-ACETAMINOPHEN 5-325 MG PO TABS
1.0000 | ORAL_TABLET | ORAL | Status: DC | PRN
  Administered 2016-12-01 – 2016-12-04 (×7): 1 via ORAL
  Filled 2016-12-01 (×8): qty 1

## 2016-12-01 MED ORDER — CHLORTHALIDONE 25 MG PO TABS: 12.5000 mg | ORAL_TABLET | Freq: Every day | ORAL | Status: DC

## 2016-12-01 MED ORDER — ATENOLOL 25 MG PO TABS
25.0000 mg | ORAL_TABLET | Freq: Every day | ORAL | Status: DC
  Administered 2016-12-01 – 2016-12-04 (×4): 25 mg via ORAL
  Filled 2016-12-01 (×4): qty 1

## 2016-12-01 MED ORDER — ACETAMINOPHEN 325 MG PO TABS
650.0000 mg | ORAL_TABLET | Freq: Four times a day (QID) | ORAL | Status: DC | PRN
  Administered 2016-12-01 – 2016-12-03 (×2): 650 mg via ORAL
  Filled 2016-12-01 (×3): qty 2

## 2016-12-01 MED ORDER — LINAGLIPTIN 5 MG PO TABS
5.0000 mg | ORAL_TABLET | Freq: Every day | ORAL | Status: DC
  Administered 2016-12-01 – 2016-12-04 (×4): 5 mg via ORAL
  Filled 2016-12-01 (×5): qty 1

## 2016-12-01 MED ORDER — ALBUTEROL SULFATE (2.5 MG/3ML) 0.083% IN NEBU
2.5000 mg | INHALATION_SOLUTION | Freq: Four times a day (QID) | RESPIRATORY_TRACT | Status: DC | PRN
Start: 2016-12-01 — End: 2016-12-04

## 2016-12-01 MED ORDER — LORATADINE 10 MG PO TABS: 10.0000 mg | ORAL_TABLET | Freq: Every day | ORAL | Status: DC | PRN

## 2016-12-01 MED ORDER — INSULIN ASPART 100 UNIT/ML ~~LOC~~ SOLN
0.0000 [IU] | Freq: Three times a day (TID) | SUBCUTANEOUS | Status: DC
  Administered 2016-12-01: 3 [IU] via SUBCUTANEOUS
  Filled 2016-12-01: qty 1

## 2016-12-01 MED ORDER — HYDRALAZINE HCL 20 MG/ML IJ SOLN
10.0000 mg | INTRAMUSCULAR | Status: DC | PRN
Start: 2016-12-01 — End: 2016-12-04

## 2016-12-01 MED ORDER — LEVOTHYROXINE SODIUM 75 MCG PO TABS
37.5000 ug | ORAL_TABLET | ORAL | Status: DC
  Administered 2016-12-01 – 2016-12-04 (×4): 37.5 ug via ORAL
  Filled 2016-12-01: qty 0.5
  Filled 2016-12-01 (×2): qty 1
  Filled 2016-12-01: qty 0.5
  Filled 2016-12-01: qty 1

## 2016-12-01 MED ORDER — LEVOTHYROXINE SODIUM 75 MCG PO TABS: 37.5000 ug | ORAL_TABLET | Freq: Every morning | ORAL | Status: DC

## 2016-12-01 MED ORDER — ONDANSETRON HCL 4 MG PO TABS: 4.0000 mg | ORAL_TABLET | Freq: Four times a day (QID) | ORAL | Status: DC | PRN

## 2016-12-01 MED ORDER — HYDROMORPHONE HCL 1 MG/ML IJ SOLN: 1.0000 mg | Freq: Once | INTRAMUSCULAR | Status: DC | PRN

## 2016-12-01 MED ORDER — ACETAMINOPHEN 650 MG RE SUPP: 650.0000 mg | Freq: Four times a day (QID) | RECTAL | Status: DC | PRN

## 2016-12-01 MED ORDER — FENTANYL CITRATE (PF) 100 MCG/2ML IJ SOLN
25.0000 ug | INTRAMUSCULAR | Status: DC | PRN
  Administered 2016-12-01: 25 ug via INTRAVENOUS
  Filled 2016-12-01: qty 2

## 2016-12-01 MED ORDER — TAMSULOSIN HCL 0.4 MG PO CAPS
0.4000 mg | ORAL_CAPSULE | Freq: Every day | ORAL | Status: DC
  Administered 2016-12-01 – 2016-12-04 (×4): 0.4 mg via ORAL
  Filled 2016-12-01 (×4): qty 1

## 2016-12-01 MED ORDER — CLOPIDOGREL BISULFATE 75 MG PO TABS
75.0000 mg | ORAL_TABLET | Freq: Every day | ORAL | Status: DC
  Administered 2016-12-01 – 2016-12-04 (×4): 75 mg via ORAL
  Filled 2016-12-01 (×4): qty 1

## 2016-12-01 NOTE — ED Notes (Signed)
Pt denies need for any pain medication at this time. Pt resting comfortably in reclining chair in room with wife at bedside

## 2016-12-01 NOTE — ED Notes (Signed)
Pt states he no longer has any chest pain

## 2016-12-01 NOTE — ED Notes (Addendum)
Pt is to be NPO after midnight for MRI tomorrow morning

## 2016-12-01 NOTE — ED Notes (Signed)
Patient CBG was 125. 

## 2016-12-01 NOTE — ED Notes (Signed)
Patient transported to MRI 

## 2016-12-01 NOTE — ED Notes (Signed)
Patient here for back pain that has made the patient bed bound.  Patient is very uneasy on his feet and is a 2 person assist to ambulate.  Pain is being controlled through PRN fentanyl and Robaxin.  Patient has foley that is still draining urine.  Patient has made small bowel movement, has tender area post void that feels still slightly compacted of stool.  Patient is scheduled for a conscious sedation for MRI and XRAY.  Medical Surgical Observation.

## 2016-12-01 NOTE — Telephone Encounter (Signed)
Noted and in notes of referral as reminder

## 2016-12-01 NOTE — Progress Notes (Addendum)
PROGRESS NOTE                                                                                                                                                                                                             Patient Demographics:    Dylan Knight, is a 83 y.o. male, DOB - Nov 02, 1924, HP:[id number]  Admit date - 11/30/2016   Admitting Physician No admitting provider for patient encounter.  Outpatient Primary MD for the patient is Maximino Greenland, MD  LOS - 0  Chief Complaint  Patient presents with  . Back Pain       Brief Narrative  Dylan Knight is a 83 y.o. male with history of CAD, peripheral vascular disease, hypertension, COPD and hypothyroidism presents to the ER because of worsening pain in the low back radiating to right lower extremity. Patient's symptoms started around 10/29/2016.   He has been briefly hospitalized at [facility] and thereafter was under the care of Dr. Rodell Perna, he continues to have back and right hip pain for which he came to the ER and got admitted to the hospital.   Subjective:    Dylan Knight today has, No headache, No chest pain, No abdominal pain - No Nausea, No new weakness tingling or numbness, No Cough - SOB. Does have ongoing right hip pain.   Assessment  & Plan :     1.Ongoing low back pain/right hip pain. X-rays obtained in the summer at [facility] hospital unremarkable. He does not have any cauda equina symptoms although he did developed urinary retention this was likely due to lack of activity and narcotics. He has no weakness at rest in his right leg, good sensation in his perineal area.  Case discussed with Dr. Rodell Perna orthopedic surgeon who will see the patient, he requests MRI L-spine under conscious sedation which has been ordered, I will also obtain CT scan of his right hip to rule out occult fracture. Continue pain control and monitor. Dr. Lorin Mercy will  evaluate the patient later.   2. Urinary retention. This likely is due to pain, narcotics and lack of activity. For now Foley catheter and Flomax.  3. GERD/heartburn. Placed on GI cocktail 3 doses thereafter Protonix from tomorrow.  4. CAD/PAD. Some nonspecific chest discomfort likely due to #3 above, EKG nonacute, treatment as an #3 above  along with continue Plavix, beta blocker and statin for secondary prevention.  5. Hypothyroidism. Continue home dose Synthroid.  6. Hyponatremia. Clinically suspicious for SIADH, obtain osmolality serum & urine, urine sodium, obtain serum uric acid, reduce IV fluids and monitor. May require Lasix.  7. COPD. No acute issues. No wheezing. Supportive care.  8. ARF - due to #2 above. Hold nephrotoxins, Foley Flomax, gentle hydration, check renal ultrasound and repeat BMP in the morning.   9. DM type II. On Trajenta and sliding scale.  CBG (last 3)   Recent Labs  12/01/16 0821  GLUCAP 243*      Family Communication  :  Wife  Code Status :  DNR  Diet : Diet heart healthy/carb modified Room service appropriate? Yes; Fluid consistency: Thin   Disposition Plan  :  TBD  Consults  :  Dr Dutch Gray  Procedures  :    CT scan Right hip ordered  MRI brain with conscious sedation ordered  DVT Prophylaxis  :  Heparin    Lab Results  Component Value Date   PLT 210 12/01/2016    Inpatient Medications  Scheduled Meds: . atenolol  25 mg Oral Daily  . clopidogrel  75 mg Oral Daily  . [START ON 12/02/2016] ezetimibe-simvastatin  1 tablet Oral Q M,W,F-1800  . gi cocktail  30 mL Oral TID  . guaiFENesin  600 mg Oral QPM  . insulin aspart  0-9 Units Subcutaneous TID WC  . levothyroxine  37.5 mcg Oral Once per day on Mon Tue Wed Thu Fri   And  . levothyroxine  50 mcg Oral Once per day on Sun Sat  . linagliptin  5 mg Oral Daily   Continuous Infusions: . sodium chloride 75 mL/hr at 12/01/16 0529  . methocarbamol (ROBAXIN)  IV     PRN  Meds:.acetaminophen **OR** acetaminophen, albuterol, fentaNYL (SUBLIMAZE) injection, hydrALAZINE, HYDROcodone-acetaminophen, HYDROmorphone (DILAUDID) injection, loratadine, methocarbamol (ROBAXIN)  IV, [DISCONTINUED] ondansetron **OR** ondansetron (ZOFRAN) IV  Antibiotics  :    Anti-infectives    None         Objective:   Vitals:   12/01/16 0000 12/01/16 0030 12/01/16 0230 12/01/16 0836  BP: 113/58 127/61 122/59 117/60  Pulse: 93 93 90 105  Resp:    19  Temp:    98.1 F (36.7 C)  TempSrc:    Oral  SpO2: 94% 95% 97% 96%  Weight:      Height:        Wt Readings from Last 3 Encounters:  11/30/16 74.8 kg (165 lb)  11/17/16 74.8 kg (165 lb)  11/02/16 72.6 kg (160 lb)    No intake or output data in the 24 hours ending 12/01/16 0929   Physical Exam  Awake Alert, Oriented X 3, No new F.N deficits, Normal affect [place].AT,PERRAL Supple Neck,No JVD, No cervical lymphadenopathy appriciated.  Symmetrical Chest wall movement, Good air movement bilaterally, CTAB RRR,No Gallops,Rubs or new Murmurs, No Parasternal Heave +ve B.Sounds, Abd Soft, No tenderness, No organomegaly appriciated, No rebound - guarding or rigidity. No Cyanosis, Clubbing or edema, No new Rash or bruise      Data Review:    CBC  Recent Labs Lab 11/30/16 2153 12/01/16 0446  WBC 16.6* 14.9*  HGB 16.2 15.5  HCT 45.5 43.2  PLT 216 210  MCV 96.6 96.6  MCH 34.4* 34.7*  MCHC 35.6 35.9  RDW 13.1 13.3  LYMPHSABS 0.8  --   MONOABS 1.6*  --   EOSABS 0.0  --  BASOSABS 0.0  --     Chemistries   Recent Labs Lab 11/30/16 2153 12/01/16 0446  NA 128* 128*  K 4.3 4.0  CL 93* 93*  CO2 22 22  GLUCOSE 134* 229*  BUN 14 15  CREATININE 1.08 1.32*  CALCIUM 10.4* 9.8   ------------------------------------------------------------------------------------------------------------------ No results for input(s): CHOL, HDL, LDLCALC, TRIG, CHOLHDL, LDLDIRECT in the last 72 hours.  Lab Results  Component Value  Date   HGBA1C 6.6 (H) 10/12/2012   ------------------------------------------------------------------------------------------------------------------ No results for input(s): TSH, T4TOTAL, T3FREE, THYROIDAB in the last 72 hours.  Invalid input(s): FREET3 ------------------------------------------------------------------------------------------------------------------ No results for input(s): VITAMINB12, FOLATE, FERRITIN, TIBC, IRON, RETICCTPCT in the last 72 hours.  Coagulation profile No results for input(s): INR, PROTIME in the last 168 hours.  No results for input(s): DDIMER in the last 72 hours.  Cardiac Enzymes No results for input(s): CKMB, TROPONINI, MYOGLOBIN in the last 168 hours.  Invalid input(s): CK ------------------------------------------------------------------------------------------------------------------ No results found for: BNP  Micro Results No results found for this or any previous visit (from the past 240 hour(s)).  Radiology Reports Dg Chest 2 View  Result Date: 11/02/2016 CLINICAL DATA:  Shortness of breath. EXAM: CHEST  2 VIEW COMPARISON:  10/10/2013 FINDINGS: The patient is mildly rotated to the right. Cardiomediastinal silhouette is unchanged. The lungs remain mildly hyperinflated with mild interstitial coarsening consistent with underlying COPD. No confluent airspace opacity, edema, pleural effusion, or pneumothorax is identified. No acute osseous abnormality is seen. IMPRESSION: No active cardiopulmonary disease. Electronically Signed   By: Logan Bores M.D.   On: 11/02/2016 10:55   Dg Lumbar Spine 2-3 Views  Result Date: 11/02/2016 CLINICAL DATA:  Back pain. EXAM: LUMBAR SPINE - 2-3 VIEW COMPARISON:  Lumbar spine MRI 12/27/2013. CT abdomen and pelvis 07/03/2014. FINDINGS: There are 5 non rib-bearing lumbar type vertebrae. Chronic L1 superior endplate compression fracture demonstrates mild vertebral body height loss, unchanged. There is no evidence of  new fracture. There is no listhesis. Advanced disc degeneration is again seen at L1-2, L3-4, and L4-5 with severe disc space narrowing, degenerative endplate sclerosis, and prominent marginal osteophytosis. Milder disc degeneration is present at L2-3 and L5-S1. Laminectomies are again noted L3 and L4. Aortic atherosclerosis is noted. IMPRESSION: 1. Advanced lumbar disc degeneration without evidence of acute osseous abnormality. 2. Chronic L1 compression fracture. 3. Aortic atherosclerosis. Electronically Signed   By: Logan Bores M.D.   On: 11/02/2016 11:00    Time Spent in minutes  30   SINGH,PRASHANT K M.D on 12/01/2016 at 9:29 AM  Between 7am to 7pm - Pager - [phone]  After 7pm go to [email or web address] - password [facility]  [facility] -  Office  [phone]

## 2016-12-01 NOTE — ED Notes (Signed)
Patient transported to X-ray 

## 2016-12-01 NOTE — ED Notes (Signed)
Pt transported to US

## 2016-12-01 NOTE — ED Notes (Signed)
Pt feeling some chest burning. Pt has been burping. Notified Admitting MD, obtained EKG. Will give GI cocktail and obtain labs.

## 2016-12-01 NOTE — ED Notes (Signed)
Spoke with MRI and pt is to be admitted first, then have MRI when anesthesiologist can manage pt

## 2016-12-01 NOTE — ED Notes (Signed)
Patient CBG was 243.

## 2016-12-01 NOTE — ED Notes (Signed)
Admitting at bedside 

## 2016-12-01 NOTE — H&P (Signed)
History and Physical    Dylan Knight [id number] DOB: [birth year] DOA: 11/30/2016  PCP: Maximino Greenland, MD  Patient coming from: Home.  Chief Complaint: Low back pain.  HPI: Dylan Knight is a 83 y.o. male with history of CAD, peripheral vascular disease, hypertension, COPD and hypothyroidism presents to the ER because of worsening pain in the low back radiating to right lower extremity. Patient's symptoms started around 10/29/2016. Patient was briefly hospitalized in [facility] in December last week 2017. Patient was referred to Dr. Lorin Mercy orthopedic surgeon who had ordered an outpatient MRI of the L-spine which was not able to be done due to patient unable to lie flat. Due to worsening pain patient came to the ER. In the ER patient also was noticed to have urinary retention. Foley catheter was placed. MRI was attempted but patient was again unable to lie flat. Patient will be admitted for further management of low back pain and will need conscious sedation for MRI. Patient has intense pain on minimal movement of right lower extremity. Denies any fall. No incontinence of bowel or urine. Patient otherwise denies any chest pain or shortness of breath.   X-ray L-spine done on 11/02/2016 showed degenerative changes and chronic L1 compression fracture.  ED Course: MRI was attempted but was unable to be done due to patient not able to lie flat. For pain patient was given Dilaudid fentanyl.  Review of Systems: As per HPI, rest all negative.   Past Medical History:  Diagnosis Date  . Arthritis    left hip  . BPH (benign prostatic hyperplasia)   . Cancer [facility])    Skin cancers removed  . Carotid artery occlusion   . COPD (chronic obstructive pulmonary disease) ([place])   . Diabetes mellitus 2011   Border line  . Diabetes mellitus without complication ([place])   . Hyperlipidemia   . Hypertension   . Hypothyroidism   . Macular degeneration   . Peripheral arterial disease ([place])  2002  . Peripheral vascular disease ([place])   . Peripheral vascular disease (HCC)    PAD  . Stroke ([place]) 2005  . Stroke [facility])    2005 - Dr Michael [place] - [place]    Past Surgical History:  Procedure Laterality Date  . ABDOMINAL AORTIC ANEURYSM REPAIR  2003   Aorto-left femoral-right iliac BPG by Dr. Scot Dock  . Aortic aneursym surgery     2003- Dr. Scot Dock  . BACK SURGERY     1960  . CAROTID ENDARTERECTOMY  Jan. 25,2007   Right CEA  . Carotid stenosis surgery on right     2007  . CARPAL TUNNEL RELEASE Left 06/02/2016   Procedure: LEFT CARPAL TUNNEL RELEASE;  Surgeon: Daryll Brod, MD;  Location: [place];  Service: Orthopedics;  Laterality: Left;  . EYE SURGERY     Catarct surgery and lens implant - bilateral  . PR VEIN BYPASS GRAFT,AORTO-FEM-POP  2005   Revision Right Fem-pop  . PR VEIN BYPASS GRAFT,AORTO-FEM-POP  2002   Right Fem-pop  . Right leg bypass     2002 -  and amputation of right fifth toe  . SPINE SURGERY  1960   lumbar spine  . [place], 2007   cervical spine  . TOE AMPUTATION  08-2001   right 5th toe amputation     reports that he quit smoking about 12 years ago. His smoking use included Cigarettes. He has a 63.00 pack-year smoking history.  He has quit using smokeless tobacco. He reports that he does not drink alcohol or use drugs.  Allergies  Allergen Reactions  . Iohexol Hives    Went away with benadryl   . Iodine Rash    Family History  Problem Relation Age of Onset  . Stroke Mother   . Hypertension Mother   . Heart disease Father   . Heart attack Father   . Cancer Brother   . Hypertension Brother     Prior to Admission medications   Medication Sig Start Date End Date Taking? Authorizing Provider  acetaminophen (TYLENOL) 325 MG tablet Take 650 mg by mouth every 6 (six) hours as needed for mild pain.    Yes Historical Provider, MD  albuterol (PROVENTIL HFA;VENTOLIN HFA) 108 (90 BASE) MCG/ACT inhaler Inhale 2  puffs into the lungs every 6 (six) hours as needed for wheezing or shortness of breath. 07/16/15  Yes Collene Gobble, MD  atenolol-chlorthalidone (TENORETIC) 50-25 MG per tablet Take 0.5 tablets by mouth daily.  04/24/13  Yes Historical Provider, MD  clopidogrel (PLAVIX) 75 MG tablet Take 75 mg by mouth daily.   Yes Historical Provider, MD  ezetimibe-simvastatin (VYTORIN) 10-40 MG per tablet Take 1 tablet by mouth See admin instructions. Take Monday, Wednesday, and Friday in evening.   Yes Historical Provider, MD  guaiFENesin (MUCINEX) 600 MG 12 hr tablet Take 600 mg by mouth every evening.    Yes Historical Provider, MD  levothyroxine (SYNTHROID, LEVOTHROID) 25 MCG tablet 1.5-2 tablets every morning. Take 1.5 tablet Monday-Friday, and 2 tabs on Saturday and Sunday. 12/31/15  Yes Historical Provider, MD  loratadine (CLARITIN) 10 MG tablet Take 10 mg by mouth daily as needed for allergies. Reported on 01/03/2016   Yes Historical Provider, MD  Multiple Vitamins-Minerals (CENTRUM SILVER PO) Take 1 tablet by mouth daily.   Yes Historical Provider, MD  Multiple Vitamins-Minerals (OCUVITE PRESERVISION PO) Take 1 tablet by mouth 2 (two) times daily.   Yes Historical Provider, MD  sitaGLIPtan-metformin (JANUMET) 50-500 MG per tablet Take 1 tablet by mouth 2 (two) times daily with a meal.   Yes Historical Provider, MD    Physical Exam: Vitals:   11/30/16 2334 12/01/16 0000 12/01/16 0030 12/01/16 0230  BP: 148/70 113/58 127/61 122/59  Pulse: 101 93 93 90  Resp:      Temp:      TempSrc:      SpO2: 96% 94% 95% 97%  Weight:      Height:          Constitutional: Moderately built and nourished. Vitals:   11/30/16 2334 12/01/16 0000 12/01/16 0030 12/01/16 0230  BP: 148/70 113/58 127/61 122/59  Pulse: 101 93 93 90  Resp:      Temp:      TempSrc:      SpO2: 96% 94% 95% 97%  Weight:      Height:       Eyes: Anicteric no pallor. ENMT: No discharge from the ears eyes nose or mouth. Neck: No mass felt.  No neck rigidity. Respiratory: No rhonchi or crepitations. Cardiovascular: S1 and S2 heard. No murmurs appreciated. Abdomen: Soft nontender bowel sounds present. No guarding or rigidity. Musculoskeletal: Pain on moving her right lower extremity. Skin: No rash. Skin appears warm. Neurologic: Alert awake oriented to time place and person. Moves all extremities. Psychiatric: Appears normal. Normal affect.   Labs on Admission: I have personally reviewed following labs and imaging studies  CBC:  Recent Labs Lab 11/30/16 2153  WBC 16.6*  NEUTROABS 14.2*  HGB 16.2  HCT 45.5  MCV 96.6  PLT 123XX123   Basic Metabolic Panel:  Recent Labs Lab 11/30/16 2153  NA 128*  K 4.3  CL 93*  CO2 22  GLUCOSE 134*  BUN 14  CREATININE 1.08  CALCIUM 10.4*   GFR: Estimated Creatinine Clearance: 41 mL/min (by C-G formula based on SCr of 1.08 mg/dL). Liver Function Tests: No results for input(s): AST, ALT, ALKPHOS, BILITOT, PROT, ALBUMIN in the last 168 hours. No results for input(s): LIPASE, AMYLASE in the last 168 hours. No results for input(s): AMMONIA in the last 168 hours. Coagulation Profile: No results for input(s): INR, PROTIME in the last 168 hours. Cardiac Enzymes: No results for input(s): CKTOTAL, CKMB, CKMBINDEX, TROPONINI in the last 168 hours. BNP (last 3 results) No results for input(s): PROBNP in the last 8760 hours. HbA1C: No results for input(s): HGBA1C in the last 72 hours. CBG: No results for input(s): GLUCAP in the last 168 hours. Lipid Profile: No results for input(s): CHOL, HDL, LDLCALC, TRIG, CHOLHDL, LDLDIRECT in the last 72 hours. Thyroid Function Tests: No results for input(s): TSH, T4TOTAL, FREET4, T3FREE, THYROIDAB in the last 72 hours. Anemia Panel: No results for input(s): VITAMINB12, FOLATE, FERRITIN, TIBC, IRON, RETICCTPCT in the last 72 hours. Urine analysis:    Component Value Date/Time   COLORURINE YELLOW 11/30/2016 2319   APPEARANCEUR CLEAR  11/30/2016 2319   APPEARANCEUR Clear 11/24/2014 1359   LABSPEC 1.013 11/30/2016 2319   LABSPEC 1.008 11/24/2014 1359   PHURINE 7.0 11/30/2016 2319   GLUCOSEU NEGATIVE 11/30/2016 2319   GLUCOSEU Negative 11/24/2014 1359   HGBUR NEGATIVE 11/30/2016 2319   BILIRUBINUR NEGATIVE 11/30/2016 2319   BILIRUBINUR Negative 11/24/2014 [place] 11/30/2016 2319   PROTEINUR NEGATIVE 11/30/2016 2319   NITRITE NEGATIVE 11/30/2016 2319   LEUKOCYTESUR NEGATIVE 11/30/2016 2319   LEUKOCYTESUR Negative 11/24/2014 1359   Sepsis Labs: @LABRCNTIP (procalcitonin:4,lacticidven:4) )No results found for this or any previous visit (from the past 240 hour(s)).   Radiological Exams on Admission: No results found.   Assessment/Plan Principal Problem:   Low back pain with right-sided sciatica Active Problems:   Peripheral vascular disease (HCC)   COPD (chronic obstructive pulmonary disease) (HCC)   Diabetes mellitus type 2, controlled (HCC)   CAD (coronary artery disease)   Hypothyroidism   Low back pain    1. Low back pain with right-sided sciatica - I have placed order for MRI of the L-spine requiring conscious sedation. We'll also get x-ray of the pelvis to rule out any fracture in the pelvis or right hip. I have placed patient on fentanyl and Robaxin. Please consult Dr. Lorin Mercy orthopedic surgeon in a.m. Patient is known to him. 2. Mild hyponatremia - we will discontinue chlorthalidone. Gently hydrate. Follow metabolic panel. 3. Urinary retention - Foley catheter has been placed. Will need voiding trial. 4. Hypertension on atenolol. Will add when necessary IV hydralazine. 5. CAD - denies any chest pain. Continue beta blocker statin and Plavix. 6. Peripheral vascular disease on statin and Plavix. 7. COPD presently not wheezing continue inhaler. 8. Hypothyroidism on Synthroid.   DVT prophylaxis: SCDs. Code Status: DO NOT RESUSCITATE.  Family Communication: Patient's wife.  Disposition  Plan: To be determined.  Consults called: None.  Admission status: Observation.    Rise Patience MD [facility] Pager [phone].  If 7PM-7AM, please contact night-coverage [email or web address] Password [email or web address]  12/01/2016, 4:05 AM

## 2016-12-01 NOTE — ED Notes (Signed)
Diabetic Diet was ordered for Lunch.

## 2016-12-01 NOTE — ED Notes (Signed)
Pt back from MRI- unable to complete scan. PA aware

## 2016-12-02 ENCOUNTER — Encounter (HOSPITAL_COMMUNITY)
Admission: EM | Disposition: A | Discharge status: Skilled Nursing Facility | Payer: Self-pay | Source: Home / Self Care | Attending: Internal Medicine

## 2016-12-02 ENCOUNTER — Observation Stay (HOSPITAL_COMMUNITY): Payer: Medicare Other

## 2016-12-02 ENCOUNTER — Observation Stay (HOSPITAL_COMMUNITY): Payer: Medicare Other | Admitting: Certified Registered"

## 2016-12-02 ENCOUNTER — Encounter (HOSPITAL_COMMUNITY): Payer: Self-pay | Admitting: Critical Care Medicine

## 2016-12-02 ENCOUNTER — Inpatient Hospital Stay (HOSPITAL_COMMUNITY): Admission: RE | Admit: 2016-12-02 | Payer: Medicare Other | Source: Ambulatory Visit

## 2016-12-02 DIAGNOSIS — I739 Peripheral vascular disease, unspecified: Secondary | ICD-10-CM | POA: Diagnosis not present

## 2016-12-02 DIAGNOSIS — M5440 Lumbago with sciatica, unspecified side: Secondary | ICD-10-CM | POA: Diagnosis not present

## 2016-12-02 DIAGNOSIS — E118 Type 2 diabetes mellitus with unspecified complications: Secondary | ICD-10-CM | POA: Diagnosis not present

## 2016-12-02 DIAGNOSIS — E039 Hypothyroidism, unspecified: Secondary | ICD-10-CM | POA: Diagnosis not present

## 2016-12-02 DIAGNOSIS — M5441 Lumbago with sciatica, right side: Secondary | ICD-10-CM | POA: Diagnosis not present

## 2016-12-02 HISTORY — PX: RADIOLOGY WITH ANESTHESIA: SHX6223

## 2016-12-02 LAB — TROPONIN I: Troponin I: <0.03 ng/mL (ref <0.03)

## 2016-12-02 LAB — GLUCOSE, CAPILLARY
Glucose-Capillary: 140 mg/dL — ABNORMAL HIGH (ref 65–99)
Glucose-Capillary: 132 mg/dL — ABNORMAL HIGH (ref 65–99)
Glucose-Capillary: 118 mg/dL — ABNORMAL HIGH (ref 65–99)
Glucose-Capillary: 183 mg/dL — ABNORMAL HIGH (ref 65–99)

## 2016-12-02 LAB — BASIC METABOLIC PANEL
Anion gap: 10 (ref 5–15)
BUN: 15 mg/dL (ref 6–20)
CO2: 24 mmol/L (ref 22–32)
Calcium: 9.3 mg/dL (ref 8.9–10.3)
Chloride: 96 mmol/L — ABNORMAL LOW (ref 101–111)
Creatinine, Ser: 1.11 mg/dL (ref 0.61–1.24)
GFR calc Af Amer: >60 mL/min (ref >60)
GFR calc non Af Amer: 56 mL/min — ABNORMAL LOW (ref >60)
Glucose, Bld: 136 mg/dL — ABNORMAL HIGH (ref 65–99)
Potassium: 3.7 mmol/L (ref 3.5–5.1)
Sodium: 130 mmol/L — ABNORMAL LOW (ref 135–145)

## 2016-12-02 LAB — CBC
HCT: 39.9 % (ref 39.0–52.0)
Hemoglobin: 14 g/dL (ref 13.0–17.0)
MCH: 34.1 pg — ABNORMAL HIGH (ref 26.0–34.0)
MCHC: 35.1 g/dL (ref 30.0–36.0)
MCV: 97.3 fL (ref 78.0–100.0)
Platelets: 169 10*3/uL (ref 150–400)
RBC: 4.1 MIL/uL — ABNORMAL LOW (ref 4.22–5.81)
RDW: 13.4 % (ref 11.5–15.5)
WBC: 8.4 10*3/uL (ref 4.0–10.5)

## 2016-12-02 LAB — HEMOGLOBIN A1C
Hgb A1c MFr Bld: 6.7 % — ABNORMAL HIGH (ref 4.8–5.6)
Mean Plasma Glucose: 146 mg/dL

## 2016-12-02 LAB — PROTIME-INR
INR: 1.07
Prothrombin Time: 14 seconds (ref 11.4–15.2)

## 2016-12-02 SURGERY — RADIOLOGY WITH ANESTHESIA
Anesthesia: General

## 2016-12-02 MED ORDER — SENNOSIDES-DOCUSATE SODIUM 8.6-50 MG PO TABS
2.0000 | ORAL_TABLET | Freq: Every evening | ORAL | Status: DC | PRN
  Administered 2016-12-03: 2 via ORAL
  Filled 2016-12-02 (×2): qty 2

## 2016-12-02 MED ORDER — POLYETHYLENE GLYCOL 3350 17 G PO PACK
17.0000 g | PACK | Freq: Every day | ORAL | Status: DC
  Administered 2016-12-02 – 2016-12-04 (×3): 17 g via ORAL
  Filled 2016-12-02 (×3): qty 1

## 2016-12-02 MED ORDER — LIDOCAINE 2% (20 MG/ML) 5 ML SYRINGE
INTRAMUSCULAR | Status: DC | PRN
  Administered 2016-12-02: 100 mg via INTRAVENOUS

## 2016-12-02 MED ORDER — FLEET ENEMA 7-19 GM/118ML RE ENEM
1.0000 | ENEMA | Freq: Every day | RECTAL | Status: DC | PRN
  Filled 2016-12-02: qty 1

## 2016-12-02 MED ORDER — PHENYLEPHRINE 40 MCG/ML (10ML) SYRINGE FOR IV PUSH (FOR BLOOD PRESSURE SUPPORT)
PREFILLED_SYRINGE | INTRAVENOUS | Status: DC | PRN
  Administered 2016-12-02: 80 ug via INTRAVENOUS

## 2016-12-02 MED ORDER — PROPOFOL 10 MG/ML IV BOLUS
INTRAVENOUS | Status: DC | PRN
  Administered 2016-12-02: 100 mg via INTRAVENOUS

## 2016-12-02 MED ORDER — FENTANYL CITRATE (PF) 100 MCG/2ML IJ SOLN: 25.0000 ug | INTRAMUSCULAR | Status: DC | PRN

## 2016-12-02 MED ORDER — ONDANSETRON HCL 4 MG/2ML IJ SOLN: 4.0000 mg | Freq: Once | INTRAMUSCULAR | Status: DC | PRN

## 2016-12-02 MED ORDER — FENTANYL CITRATE (PF) 100 MCG/2ML IJ SOLN
INTRAMUSCULAR | Status: DC | PRN
  Administered 2016-12-02 (×5): 25 ug via INTRAVENOUS

## 2016-12-02 MED ORDER — LACTATED RINGERS IV SOLN
INTRAVENOUS | Status: DC
  Administered 2016-12-02: 13:00:00 via INTRAVENOUS

## 2016-12-02 MED ORDER — DEXAMETHASONE SODIUM PHOSPHATE 10 MG/ML IJ SOLN
4.0000 mg | Freq: Two times a day (BID) | INTRAMUSCULAR | Status: DC
  Administered 2016-12-02 (×2): 4 mg via INTRAVENOUS
  Filled 2016-12-02 (×3): qty 0.4

## 2016-12-02 NOTE — Transfer of Care (Signed)
Immediate Anesthesia Transfer of Care Note  Patient: Dylan Knight  Procedure(s) Performed: Procedure(s): MRI LUMBAR SPINE WITHOUT (N/A)  Patient Location: PACU  Anesthesia Type:General  Level of Consciousness: awake, alert  and oriented  Airway & Oxygen Therapy: Patient Spontanous Breathing  Post-op Assessment: Report given to RN, Post -op Vital signs reviewed and stable and Patient moving all extremities X 4  Post vital signs: Reviewed and stable  Last Vitals:  Vitals:   12/02/16 1000 12/02/16 1528  BP: 137/71 134/62  Pulse: 82 75  Resp: 18 12  Temp: 37 C 36.5 C    Last Pain:  Vitals:   12/02/16 1238  TempSrc:   PainSc: 4          Complications: No apparent anesthesia complications

## 2016-12-02 NOTE — Anesthesia Preprocedure Evaluation (Addendum)
Anesthesia Evaluation  Patient identified by MRN, date of birth, ID band Patient awake    Reviewed: Allergy & Precautions, NPO status , Patient's Chart, lab work & pertinent test results, reviewed documented beta blocker date and time   Airway Mallampati: II  TM Distance: >3 FB Neck ROM: Full    Dental  (+) Teeth Intact, Dental Advisory Given, Caps, Missing,    Pulmonary COPD, former smoker,    Pulmonary exam normal breath sounds clear to auscultation       Cardiovascular hypertension, Pt. on home beta blockers and Pt. on medications + Peripheral Vascular Disease  Normal cardiovascular exam Rhythm:Regular Rate:Normal     Neuro/Psych CVA, Residual Symptoms negative psych ROS   GI/Hepatic negative GI ROS, Neg liver ROS,   Endo/Other  diabetes, Type 2, Oral Hypoglycemic AgentsHypothyroidism   Renal/GU negative Renal ROS Bladder dysfunction (Acute urinary retention)      Musculoskeletal  (+) Arthritis ,   Abdominal   Peds  Hematology  (+) Blood dyscrasia (Plavix therapy), ,   Anesthesia Other Findings Day of surgery medications reviewed with the patient.  Reproductive/Obstetrics                            Anesthesia Physical Anesthesia Plan  ASA: III  Anesthesia Plan: General   Post-op Pain Management:    Induction: Intravenous  Airway Management Planned: Oral ETT  Additional Equipment:   Intra-op Plan:   Post-operative Plan: Extubation in OR  Informed Consent: I have reviewed the patients History and Physical, chart, labs and discussed the procedure including the risks, benefits and alternatives for the proposed anesthesia with the patient or authorized representative who has indicated his/her understanding and acceptance.   Dental advisory given  Plan Discussed with: CRNA  Anesthesia Plan Comments: (Risks/benefits of general anesthesia discussed with patient including risk of  damage to teeth, lips, gum, and tongue, nausea/vomiting, allergic reactions to medications, and the possibility of heart attack, stroke and death.  All patient questions answered.  Patient wishes to proceed.)        Anesthesia Quick Evaluation

## 2016-12-02 NOTE — Progress Notes (Signed)
PROGRESS NOTE        PATIENT DETAILS Name: SNAPPER DECELLES Age: 83 y.o. Sex: male Date of Birth: [birth year] Admit Date: 11/30/2016 Admitting Physician Rise Patience, MD [id number] Shawnie Dapper, MD  Brief Narrative: [name] with h/o CAD, PVD, HTN, COPD and hypothyroidism. Pt. has had multiple visits since 10/29/16 for severe back pain radiating down to right lower extremity. Came to ED on 11/30/16 for this acute on chronic pain, and was unable to tolerate MRI, and also had some urinary retention. Admitted on 12/02/15 for MRI under conscious sedation and pain control. See details below.  [name] who appears to be in severe pain this morning lying in bed. Repositioning even triggers pain.   Assessment/Plan: Intractable Low back pain with right-sided sciatica: Acute on chronic since late December of 2017-requiring numerous ER visits/visits to orthopedics office. He has appropriate lower extremity strength-although exam is somewhat limited due to pain. Straight leg raising test positive at 30 on the right side. Awaiting MRI of lumbar spine to be done under conscious sedation. Also awaiting CT of the hip right. We will add Decadron, continue with current narcotic regimen. Add bowel regimen for prevention of narcotic induced constipation. Will await further recommendations from Dr. Lorin Mercy. Will need to be mobilized with physical therapy.       Acute urinary retention: Etiology multifactorial pain, narcotics, sedentary. Foley catheter, continue to monitor strict I&O. Continue flomax. Voiding trial prior to discharge  Essential HTN: Controlled- Continue atenolol.   Dyslipidemia:Continue ezetimibe-simvastatin.   Peripheral vascular disease: In 2003 AAA stent was placed. Continue atenolol, statin therapy, plavix, and encourage active lifestyle.    COPD (chronic obstructive pulmonary disease): Due to 63 pack yr hx. No  exacerbation during this hospital stay. 96% O2 sat on Ra. Continue albuterol prn.       Diabetes mellitus type 2, controlled: Continue linagliptin and novoLog sliding scale.     Hypothyroidism: Continue levothyroxine.   GERD: Had flare up of "chest burning" in ED , was given Gi cocktail (maalox, Lidocaine, Donnatal). Troponin was <0.03. Continue protonix.   DVT Prophylaxis: Full dose anticoagulant heparin.   Code Status: DNR  Family Communication: Spouse at bedside  Disposition Plan: Remain inpatient-but will plan on Home health vs SNF on discharge  Antimicrobial agents: Anti-infectives    None      Procedures: MRI WO contrast Lumber spine with conscious sedation   CT WO contrast right hip   CONSULTS: None  Time spent: 25 minutes-Greater than 50% of this time was spent in counseling, explanation of diagnosis, planning of further management, and coordination of care.  MEDICATIONS: Scheduled Meds: . atenolol  25 mg Oral Daily  . clopidogrel  75 mg Oral Daily  . dexamethasone  4 mg Intravenous Q12H  . ezetimibe-simvastatin  1 tablet Oral Q M,W,F-1800  . gi cocktail  30 mL Oral TID  . guaiFENesin  600 mg Oral QPM  . heparin subcutaneous  5,000 Units Subcutaneous Q8H  . insulin aspart  0-15 Units Subcutaneous TID WC  . insulin aspart  0-5 Units Subcutaneous QHS  . levothyroxine  37.5 mcg Oral Once per day on Mon Tue Wed Thu Fri   And  . levothyroxine  50 mcg Oral Once per day on Sun Sat  . linagliptin  5 mg Oral  Daily  . pantoprazole  40 mg Oral Daily  . polyethylene glycol  17 g Oral Daily  . tamsulosin  0.4 mg Oral Daily   Continuous Infusions: PRN Meds:.acetaminophen **OR** acetaminophen, albuterol, fentaNYL (SUBLIMAZE) injection, hydrALAZINE, HYDROcodone-acetaminophen, HYDROmorphone (DILAUDID) injection, loratadine, methocarbamol (ROBAXIN)  IV, [DISCONTINUED] ondansetron **OR** ondansetron (ZOFRAN) IV, senna-docusate, sodium phosphate   PHYSICAL EXAM: Vital  signs: Vitals:   12/01/16 1746 12/01/16 2000 12/02/16 0448 12/02/16 1000  BP: (!) 115/58 (!) 143/79 (!) 142/62 137/71  Pulse: 90 92 79 82  Resp: 18  18 18   Temp: 98 F (36.7 C) 97.4 F (36.3 C) 97.6 F (36.4 C) 98.6 F (37 C)  TempSrc: Oral Oral Oral Oral  SpO2: 99% 96% 95% 96%  Weight: 74.8 kg (165 lb)     Height: 5\' 6"  (1.676 m)      Filed Weights   11/30/16 2038 12/01/16 1746  Weight: 74.8 kg (165 lb) 74.8 kg (165 lb)   Body mass index is 26.63 kg/m.   General appearance: Awake, alert, in severe discomfort due to pain.  Eyes: PERRLA, no scleral icterus.Pink conjunctiva HEENT: Atraumatic and Normocephalic Neck: Supple, no JVD.  Resp:Good air entry bilaterally, clear to auscultation b/l.  CVS: S1 S2 regular, no murmurs.  GI: Bowel sounds present, Non tender and not distended with no gaurding, rigidity or rebound.No organomegaly  GU: Testicular exam sensation intact, no concerns cauda equina.  Extremities: B/L Lower Ext shows no edema, both legs are warm to touch Neurology:  Speech clear,Non focal. Sensation grossly intact LE B/L.  Psychiatric: Normal judgment and insight. AAO x 3. Normal mood. Musculoskeletal: Dorsiflexion and plantar flexion against resistance is intact b/l. Hip flexion against resistance intact b/l. ROM of right hip flexion slightly limited due to pain.  Skin:No Rash, warm and dry.    I have personally reviewed following labs and imaging studies  LABORATORY DATA: CBC:  Recent Labs Lab 11/30/16 2153 12/01/16 0446 12/02/16 0031  WBC 16.6* 14.9* 8.4  NEUTROABS 14.2*  --   --   HGB 16.2 15.5 14.0  HCT 45.5 43.2 39.9  MCV 96.6 96.6 97.3  PLT 216 210 [id number]    Basic Metabolic Panel:  Recent Labs Lab 11/30/16 2153 12/01/16 0446 12/02/16 0031  NA 128* 128* 130*  K 4.3 4.0 3.7  CL 93* 93* 96*  CO2 22 22 24   GLUCOSE 134* 229* 136*  BUN 14 15 15   CREATININE 1.08 1.32* 1.11  CALCIUM 10.4* 9.8 9.3    GFR: Estimated Creatinine Clearance:  39.9 mL/min (by C-G formula based on SCr of 1.11 mg/dL).  Liver Function Tests: No results for input(s): AST, ALT, ALKPHOS, BILITOT, PROT, ALBUMIN in the last 168 hours. No results for input(s): LIPASE, AMYLASE in the last 168 hours. No results for input(s): AMMONIA in the last 168 hours.  Coagulation Profile:  Recent Labs Lab 12/02/16 0031  INR 1.07    Cardiac Enzymes:  Recent Labs Lab 12/01/16 0934 12/01/16 1804 12/02/16 0031  TROPONINI <0.03 <0.03 <0.03    BNP (last 3 results) No results for input(s): PROBNP in the last 8760 hours.  HbA1C:  Recent Labs  12/01/16 0446  HGBA1C 6.7*    CBG:  Recent Labs Lab 12/01/16 0821 12/01/16 1129 12/01/16 1745 12/01/16 2158 12/02/16 0809  GLUCAP 243* 125* 178* 154* 140*    Lipid Profile: No results for input(s): CHOL, HDL, LDLCALC, TRIG, CHOLHDL, LDLDIRECT in the last 72 hours.  Thyroid Function Tests: No results for input(s): TSH, T4TOTAL, FREET4,  T3FREE, THYROIDAB in the last 72 hours.  Anemia Panel: No results for input(s): VITAMINB12, FOLATE, FERRITIN, TIBC, IRON, RETICCTPCT in the last 72 hours.  Urine analysis:    Component Value Date/Time   COLORURINE YELLOW 11/30/2016 2319   APPEARANCEUR CLEAR 11/30/2016 2319   APPEARANCEUR Clear 11/24/2014 1359   LABSPEC 1.013 11/30/2016 2319   LABSPEC 1.008 11/24/2014 1359   PHURINE 7.0 11/30/2016 2319   GLUCOSEU NEGATIVE 11/30/2016 2319   GLUCOSEU Negative 11/24/2014 1359   HGBUR NEGATIVE 11/30/2016 2319   BILIRUBINUR NEGATIVE 11/30/2016 2319   BILIRUBINUR Negative 11/24/2014 [place] 11/30/2016 2319   PROTEINUR NEGATIVE 11/30/2016 2319   NITRITE NEGATIVE 11/30/2016 2319   LEUKOCYTESUR NEGATIVE 11/30/2016 2319   LEUKOCYTESUR Negative 11/24/2014 1359    Sepsis Labs: Lactic Acid, Venous No results found for: LATICACIDVEN  MICROBIOLOGY: No results found for this or any previous visit (from the past 240 hour(s)).  RADIOLOGY  STUDIES/RESULTS: Dg Pelvis 1-2 Views  Result Date: 12/01/2016 CLINICAL DATA:  Pain right hip.  No injury. EXAM: PELVIS - 1-2 VIEW COMPARISON:  11/02/2016.  CT 07/03/2014. FINDINGS: Degenerative changes lumbar spine and both hips. No acute bony or joint abnormality identified. Aortoiliac and femoral atherosclerotic vascular calcification. Postsurgical change noted left pelvis. IMPRESSION: 1. Degenerative changes lumbar spine and both hips. Diffuse osteopenia. No acute abnormality. 2.  Aortoiliac and peripheral vascular disease. Electronically Signed   By: Marcello Moores  Register   On: 12/01/2016 16:37   US Renal  Result Date: 12/01/2016 CLINICAL DATA:  Acute renal failure, diabetes, peripheral vascular disease, previous CVA EXAM: RENAL / URINARY TRACT ULTRASOUND COMPLETE COMPARISON:  Abdominal and pelvic CT scan of July 03, 2014 FINDINGS: Right Kidney: Length: 11.6 cm. The renal cortical echotexture is approximately equal to or slightly greater than that of the adjacent liver. There is no hydronephrosis. Left Kidney: Length: 10.7 cm. The renal cortical echotexture is mildly increased similar to that on the right. There is an upper pole cyst measuring 1.9 x 1.6 x 1.6 cm. There is no hydronephrosis. Bladder: The urinary bladder is decompressed by a Foley catheter. IMPRESSION: Mildly increased renal cortical echotexture consistent with medical renal disease. There is no hydronephrosis. The urinary bladder is decompressed by a Foley catheter. Electronically Signed   By: David  Martinique M.D.   On: 12/01/2016 11:09     LOS: 0 days   Rose Clousing, PAS  [employer]   If 7PM-7AM, please contact night-coverage [email or web address] Password [facility] 12/02/2016, 11:50 AM  Attending MD note  Patient was seen, examined,treatment plan was discussed with the PA-S.  I have personally reviewed the clinical findings, lab, imaging studies and management of this patient in detail. I agree with the documentation, as recorded by  the PA-S.   Patient continues to have back pain radiating down his right thigh.  On Exam: Gen. exam: Awake, alert, not in any distress Chest: Good air entry bilaterally, no rhonchi or rales CVS: S1-S2 regular, no murmurs Abdomen: Soft, nontender and nondistended Neurology: No lower extremity weakness evident on exam-although somewhat limited by pain. Reflexes 2+, sensation is intact. Skin: No rash or lesions  Impression: Intractable low back pain: Probably due to degenerative disc disease/disc bulge with nerve impingement  Plan: Await MRI-add IV steroids-continue current narcotic dosage. Await orthopedic eval May need SNF on discharge  Rest as above  [facility]

## 2016-12-03 ENCOUNTER — Observation Stay (HOSPITAL_COMMUNITY): Payer: Medicare Other

## 2016-12-03 ENCOUNTER — Encounter (HOSPITAL_COMMUNITY): Payer: Self-pay | Admitting: Radiology

## 2016-12-03 ENCOUNTER — Encounter (INDEPENDENT_AMBULATORY_CARE_PROVIDER_SITE_OTHER): Payer: Self-pay | Admitting: Ophthalmology

## 2016-12-03 DIAGNOSIS — Z8679 Personal history of other diseases of the circulatory system: Secondary | ICD-10-CM | POA: Diagnosis not present

## 2016-12-03 DIAGNOSIS — I2510 Atherosclerotic heart disease of native coronary artery without angina pectoris: Secondary | ICD-10-CM | POA: Diagnosis present

## 2016-12-03 DIAGNOSIS — M5441 Lumbago with sciatica, right side: Secondary | ICD-10-CM | POA: Diagnosis present

## 2016-12-03 DIAGNOSIS — Z7902 Long term (current) use of antithrombotics/antiplatelets: Secondary | ICD-10-CM | POA: Diagnosis not present

## 2016-12-03 DIAGNOSIS — Z87891 Personal history of nicotine dependence: Secondary | ICD-10-CM | POA: Diagnosis not present

## 2016-12-03 DIAGNOSIS — K219 Gastro-esophageal reflux disease without esophagitis: Secondary | ICD-10-CM | POA: Diagnosis present

## 2016-12-03 DIAGNOSIS — E785 Hyperlipidemia, unspecified: Secondary | ICD-10-CM | POA: Diagnosis present

## 2016-12-03 DIAGNOSIS — M5440 Lumbago with sciatica, unspecified side: Secondary | ICD-10-CM | POA: Diagnosis not present

## 2016-12-03 DIAGNOSIS — Z79899 Other long term (current) drug therapy: Secondary | ICD-10-CM | POA: Diagnosis not present

## 2016-12-03 DIAGNOSIS — Z91041 Radiographic dye allergy status: Secondary | ICD-10-CM | POA: Diagnosis not present

## 2016-12-03 DIAGNOSIS — I739 Peripheral vascular disease, unspecified: Secondary | ICD-10-CM | POA: Diagnosis not present

## 2016-12-03 DIAGNOSIS — Z66 Do not resuscitate: Secondary | ICD-10-CM | POA: Diagnosis present

## 2016-12-03 DIAGNOSIS — Z7984 Long term (current) use of oral hypoglycemic drugs: Secondary | ICD-10-CM | POA: Diagnosis not present

## 2016-12-03 DIAGNOSIS — R339 Retention of urine, unspecified: Secondary | ICD-10-CM | POA: Diagnosis present

## 2016-12-03 DIAGNOSIS — I10 Essential (primary) hypertension: Secondary | ICD-10-CM | POA: Diagnosis present

## 2016-12-03 DIAGNOSIS — J449 Chronic obstructive pulmonary disease, unspecified: Secondary | ICD-10-CM | POA: Diagnosis present

## 2016-12-03 DIAGNOSIS — Z823 Family history of stroke: Secondary | ICD-10-CM | POA: Diagnosis not present

## 2016-12-03 DIAGNOSIS — Z85828 Personal history of other malignant neoplasm of skin: Secondary | ICD-10-CM | POA: Diagnosis not present

## 2016-12-03 DIAGNOSIS — G8929 Other chronic pain: Secondary | ICD-10-CM | POA: Diagnosis present

## 2016-12-03 DIAGNOSIS — E118 Type 2 diabetes mellitus with unspecified complications: Secondary | ICD-10-CM | POA: Diagnosis not present

## 2016-12-03 DIAGNOSIS — M545 Low back pain: Secondary | ICD-10-CM | POA: Diagnosis present

## 2016-12-03 DIAGNOSIS — H3530 Unspecified macular degeneration: Secondary | ICD-10-CM | POA: Diagnosis present

## 2016-12-03 DIAGNOSIS — E871 Hypo-osmolality and hyponatremia: Secondary | ICD-10-CM | POA: Diagnosis present

## 2016-12-03 DIAGNOSIS — E039 Hypothyroidism, unspecified: Secondary | ICD-10-CM | POA: Diagnosis present

## 2016-12-03 DIAGNOSIS — Z8249 Family history of ischemic heart disease and other diseases of the circulatory system: Secondary | ICD-10-CM | POA: Diagnosis not present

## 2016-12-03 DIAGNOSIS — Z8673 Personal history of transient ischemic attack (TIA), and cerebral infarction without residual deficits: Secondary | ICD-10-CM | POA: Diagnosis not present

## 2016-12-03 DIAGNOSIS — E1151 Type 2 diabetes mellitus with diabetic peripheral angiopathy without gangrene: Secondary | ICD-10-CM | POA: Diagnosis present

## 2016-12-03 DIAGNOSIS — N401 Enlarged prostate with lower urinary tract symptoms: Secondary | ICD-10-CM | POA: Diagnosis present

## 2016-12-03 LAB — GLUCOSE, CAPILLARY
Glucose-Capillary: 251 mg/dL — ABNORMAL HIGH (ref 65–99)
Glucose-Capillary: 129 mg/dL — ABNORMAL HIGH (ref 65–99)
Glucose-Capillary: 196 mg/dL — ABNORMAL HIGH (ref 65–99)
Glucose-Capillary: 127 mg/dL — ABNORMAL HIGH (ref 65–99)

## 2016-12-03 MED ORDER — LIDOCAINE 5 % EX PTCH
1.0000 | MEDICATED_PATCH | CUTANEOUS | Status: DC
  Administered 2016-12-03 – 2016-12-04 (×2): 1 via TRANSDERMAL
  Filled 2016-12-03 (×2): qty 1

## 2016-12-03 MED ORDER — DEXAMETHASONE SODIUM PHOSPHATE 4 MG/ML IJ SOLN
4.0000 mg | Freq: Two times a day (BID) | INTRAMUSCULAR | Status: DC
  Administered 2016-12-03 (×2): 4 mg via INTRAVENOUS
  Filled 2016-12-03 (×4): qty 1

## 2016-12-03 MED FILL — Propofol IV Emul 200 MG/20ML (10 MG/ML): INTRAVENOUS | Qty: 10 | Status: AC

## 2016-12-03 MED FILL — Fentanyl Citrate Preservative Free (PF) Inj 100 MCG/2ML: INTRAMUSCULAR | Qty: 2 | Status: AC

## 2016-12-03 MED FILL — Phenylephrine-NaCl Pref Syr 0.4 MG/10ML-0.9% (40 MCG/ML): INTRAVENOUS | Qty: 10 | Status: AC

## 2016-12-03 MED FILL — Lidocaine HCl Local Soln Prefilled Syringe 100 MG/5ML (2%): INTRAMUSCULAR | Qty: 5 | Status: AC

## 2016-12-03 MED FILL — Lactated Ringer's Solution: INTRAVENOUS | Qty: 1000 | Status: AC

## 2016-12-03 NOTE — Progress Notes (Signed)
PROGRESS NOTE        PATIENT DETAILS Name: Dylan Knight Age: 83 y.o. Sex: male Date of Birth: 11-07-1924 Admit Date: 11/30/2016 Admitting Physician Rise Patience, MD [id number] Shawnie Dapper, MD  Brief Narrative: [name] with h/o COD, PVD, HTN, COPD and hypothyroidism. Pt. Has had multiple ED visits for acute on chronic severe back pain radiating down the right lower extremity since December 2017. He was admitted on 12/02/15 for MRI under conscious sedation and pain control. MRI WO contrast of lumber spine revealed numerous discs bulging from T12 - S1. Patient is undergoing CT of the right hip this morning, awaiting results. See details below.    [name] who appears in a very pleasant mood this morning sitting up at the side of his bed. He states pain is much better than yesterday and he is able to lift his right leg > 30 degrees.   Assessment/Plan:  Intractable Low back pain with right-sided sciatica:  MRI WO contrast of lumber spine revealed numerous discs bulging from T12 - S1. Patient is undergoing CT of the right hip this morning, awaiting results. Pt. states pain has been well controlled with current medication regimen, and denies any issues with constipation. Continue pain medications prn, and dexamethasone. Waiting for PT evaluation, if pain is manageable with ambulation consider discharge tomorrow. Started discussion with patient and spouse regarding rehab facility vs. HHPT.    Peripheral vascular disease:  In 2003 AAA stent was placed. Continue atenolol, statin therapy, plavix, and encourage active lifestyle.    COPD (chronic obstructive pulmonary disease):  Due to 63 pack yr. Hx. No exacerbation during this hospital stay. 98% O2 saturation on RA. Continue albuterol prn.    Diabetes mellitus type 2, controlled:  Last HgbA1C was 6.7. Continue linagliptin and novoLOG sliding scale.    Essential HTN:    Controlled, continue atenolol.   Hypothyroidism:  Continue levothyroxine   GERD:  Had flare up of "chest burning" in ED , was given GI cocktail (maalox, Lidocaine, Donnatal). Troponin was <0.03. Patient has not had any more episodes of this chest pain during this hospital stay. Continue protonix.   DVT Prophylaxis: Full dose anticoagulation with Heparin 5,000 units [place]   Code Status: DNR  Family Communication: Spouse at bedside  Disposition Plan: Remain inpatient today, tentative discharge tomorrow pending PT evaluation and Hip CT results.   Antimicrobial agents: Anti-infectives    None      Procedures: CT WO contrast of Right Hip   CONSULTS: None at this time   Time spent: 25 minutes-Greater than 50% of this time was spent in counseling, explanation of diagnosis, planning of further management, and coordination of care.  MEDICATIONS: Scheduled Meds: . atenolol  25 mg Oral Daily  . clopidogrel  75 mg Oral Daily  . dexamethasone  4 mg Intravenous Q12H  . ezetimibe-simvastatin  1 tablet Oral Q M,W,F-1800  . gi cocktail  30 mL Oral TID  . guaiFENesin  600 mg Oral QPM  . heparin subcutaneous  5,000 Units Subcutaneous Q8H  . insulin aspart  0-15 Units Subcutaneous TID WC  . insulin aspart  0-5 Units Subcutaneous QHS  . levothyroxine  37.5 mcg Oral Once per day on Mon Tue Wed Thu Fri   And  . levothyroxine  50 mcg Oral  Once per day on Sun Sat  . lidocaine  1 patch Transdermal Q24H  . linagliptin  5 mg Oral Daily  . pantoprazole  40 mg Oral Daily  . polyethylene glycol  17 g Oral Daily  . tamsulosin  0.4 mg Oral Daily   Continuous Infusions: PRN Meds:.acetaminophen **OR** acetaminophen, albuterol, fentaNYL (SUBLIMAZE) injection, fentaNYL (SUBLIMAZE) injection, hydrALAZINE, HYDROcodone-acetaminophen, HYDROmorphone (DILAUDID) injection, loratadine, methocarbamol (ROBAXIN)  IV, [DISCONTINUED] ondansetron **OR** ondansetron (ZOFRAN) IV, ondansetron (ZOFRAN) IV,  senna-docusate, sodium phosphate   PHYSICAL EXAM: Vital signs: Vitals:   12/02/16 1528 12/02/16 1544 12/02/16 2200 12/03/16 0516  BP: 134/62 135/68 (!) 122/58 133/64  Pulse: 75 75 80 79  Resp: 12 20 20 19   Temp: 97.7 F (36.5 C) 97.7 F (36.5 C) 97.8 F (36.6 C) 97.8 F (36.6 C)  TempSrc:   Oral Oral  SpO2: 93% 96% 98% 98%  Weight:   74.9 kg (165 lb 1.6 oz)   Height:       Filed Weights   11/30/16 2038 12/01/16 1746 12/02/16 2200  Weight: 74.8 kg (165 lb) 74.8 kg (165 lb) 74.9 kg (165 lb 1.6 oz)   Body mass index is 26.65 kg/m.   General appearance: Awake, alert, not in any distress. Speech Clear. Not toxic Looking Eyes: PERRLA,no scleral icterus. Pink conjunctiva HEENT: Atraumatic and Normocephalic Neck: Supple, no JVD.  Resp:Good air entry bilaterally, lungs clear b/l to auscultation.  CVS: S1 S2 regular, no murmurs.  GI: Bowel sounds present, Non tender and not distended with no gaurding, rigidity or rebound. Extremities: B/L Lower Ext shows no edema, both legs are warm to touch Neurology:  Speech clear,Non focal, sensation is grossly intact. Psychiatric: Normal judgment and insight. AAO x 3. Normal mood. Musculoskeletal:No digital cyanosis. With straight leg test patient could lift right leg >30 degrees without pain.  Skin:No Rash, warm and dry Wounds:N/A  I have personally reviewed following labs and imaging studies  LABORATORY DATA: CBC:  Recent Labs Lab 11/30/16 2153 12/01/16 0446 12/02/16 0031  WBC 16.6* 14.9* 8.4  NEUTROABS 14.2*  --   --   HGB 16.2 15.5 14.0  HCT 45.5 43.2 39.9  MCV 96.6 96.6 97.3  PLT 216 210 [id number]    Basic Metabolic Panel:  Recent Labs Lab 11/30/16 2153 12/01/16 0446 12/02/16 0031  NA 128* 128* 130*  K 4.3 4.0 3.7  CL 93* 93* 96*  CO2 22 22 24   GLUCOSE 134* 229* 136*  BUN 14 15 15   CREATININE 1.08 1.32* 1.11  CALCIUM 10.4* 9.8 9.3    GFR: Estimated Creatinine Clearance: 39.9 mL/min (by C-G formula based on SCr of  1.11 mg/dL).  Liver Function Tests: No results for input(s): AST, ALT, ALKPHOS, BILITOT, PROT, ALBUMIN in the last 168 hours. No results for input(s): LIPASE, AMYLASE in the last 168 hours. No results for input(s): AMMONIA in the last 168 hours.  Coagulation Profile:  Recent Labs Lab 12/02/16 0031  INR 1.07    Cardiac Enzymes:  Recent Labs Lab 12/01/16 0934 12/01/16 1804 12/02/16 0031  TROPONINI <0.03 <0.03 <0.03    BNP (last 3 results) No results for input(s): PROBNP in the last 8760 hours.  HbA1C:  Recent Labs  12/01/16 0446  HGBA1C 6.7*    CBG:  Recent Labs Lab 12/02/16 0809 12/02/16 1225 12/02/16 1640 12/02/16 2159 12/03/16 0732  GLUCAP 140* 132* 118* 183* 251*    Lipid Profile: No results for input(s): CHOL, HDL, LDLCALC, TRIG, CHOLHDL, LDLDIRECT in the last 72 hours.  Thyroid Function Tests: No results for input(s): TSH, T4TOTAL, FREET4, T3FREE, THYROIDAB in the last 72 hours.  Anemia Panel: No results for input(s): VITAMINB12, FOLATE, FERRITIN, TIBC, IRON, RETICCTPCT in the last 72 hours.  Urine analysis:    Component Value Date/Time   COLORURINE YELLOW 11/30/2016 2319   APPEARANCEUR CLEAR 11/30/2016 2319   APPEARANCEUR Clear 11/24/2014 1359   LABSPEC 1.013 11/30/2016 2319   LABSPEC 1.008 11/24/2014 1359   PHURINE 7.0 11/30/2016 2319   GLUCOSEU NEGATIVE 11/30/2016 2319   GLUCOSEU Negative 11/24/2014 1359   HGBUR NEGATIVE 11/30/2016 2319   BILIRUBINUR NEGATIVE 11/30/2016 2319   BILIRUBINUR Negative 11/24/2014 [place] 11/30/2016 2319   PROTEINUR NEGATIVE 11/30/2016 2319   NITRITE NEGATIVE 11/30/2016 2319   LEUKOCYTESUR NEGATIVE 11/30/2016 2319   LEUKOCYTESUR Negative 11/24/2014 1359    Sepsis Labs: Lactic Acid, Venous No results found for: LATICACIDVEN  MICROBIOLOGY: No results found for this or any previous visit (from the past 240 hour(s)).  RADIOLOGY STUDIES/RESULTS: Dg Pelvis 1-2 Views  Result Date:  12/01/2016 CLINICAL DATA:  Pain right hip.  No injury. EXAM: PELVIS - 1-2 VIEW COMPARISON:  11/02/2016.  CT 07/03/2014. FINDINGS: Degenerative changes lumbar spine and both hips. No acute bony or joint abnormality identified. Aortoiliac and femoral atherosclerotic vascular calcification. Postsurgical change noted left pelvis. IMPRESSION: 1. Degenerative changes lumbar spine and both hips. Diffuse osteopenia. No acute abnormality. 2.  Aortoiliac and peripheral vascular disease. Electronically Signed   By: Marcello Moores  Register   On: 12/01/2016 16:37   Mr Lumbar Spine Wo Contrast  Result Date: 12/02/2016 CLINICAL DATA:  [name] with h/o CAD, PVD, HTN, COPD and hypothyroidism. Pt. has had multiple visits since 10/29/16 for severe back pain radiating down to right lower extremity. Came to ED on 11/30/16 for this acute on chronic pain. EXAM: MRI LUMBAR SPINE WITHOUT CONTRAST TECHNIQUE: Multiplanar, multisequence MR imaging of the lumbar spine was performed. No intravenous contrast was administered. COMPARISON:  MR lumbar spine 12/27/2013 FINDINGS: Segmentation:  Standard. Alignment:  Physiologic. Vertebrae:  No fracture, evidence of discitis, or bone lesion. Conus medullaris: Extends to the L1 level and appears normal. Paraspinal and other soft tissues: No paraspinal abnormality. Small stable left renal cyst. Disc levels: Disc spaces: Degenerative disc disease with disc height loss at L1-2, L3-4, and L4-5 with disc height loss. Prior laminectomy at L3-4. T12-L1: Mild broad-based disc bulge eccentric towards the right with the right lateral component abutting the right extraforaminal T12 nerve root. Mild spinal stenosis. Mild bilateral foraminal stenosis. L1-L2: Broad-based disc bulge with a small broad left paracentral disc protrusion. Mild bilateral facet arthropathy. Moderate spinal stenosis. Left lateral recess stenosis. No evidence of neural foraminal stenosis. L2-L3: Broad-based disc bulge. Moderate  bilateral facet arthropathy. Mild spinal stenosis. Moderate right and mild left foraminal stenosis. L3-L4: Broad-based disc bulge. Moderate bilateral facet arthropathy. Mild spinal stenosis. Bilateral lateral recess stenosis. Severe bilateral foraminal stenosis. L4-L5: Broad-based disc bulge. Mild bilateral facet arthropathy. Moderate bilateral foraminal stenosis, left worse than right. L5-S1: Broad-based disc bulge with a small right paracentral disc protrusion contacting the right intraspinal S1 nerve root. Mild bilateral foraminal stenosis. No central canal stenosis. IMPRESSION: 1. At T12-L1 there is a mild broad-based disc bulge eccentric towards the right with the right lateral component abutting the right extraforaminal T12 nerve root. Mild spinal stenosis. Mild bilateral foraminal stenosis. 2. At L1-2 there is a broad-based disc bulge with a small broad left paracentral disc protrusion. Mild bilateral facet  arthropathy. Moderate spinal stenosis. Left lateral recess stenosis. 3. At L2-3 there is a broad-based Broad-based disc bulge. Moderate bilateral facet arthropathy. Mild spinal stenosis. Moderate right and mild left foraminal stenosis. 4. At L3-4 there is a broad-based disc bulge. Moderate bilateral facet arthropathy. Mild spinal stenosis. Bilateral lateral recess stenosis. Severe bilateral foraminal stenosis. 5. At L4-5 there is a broad-based disc bulge. Mild bilateral facet arthropathy. Moderate bilateral foraminal stenosis, left worse than right. 6. At L5-S1 there is a broad-based disc bulge with a small right paracentral disc protrusion contacting the right intraspinal S1 nerve root. Mild bilateral foraminal stenosis. 7. No significant interval change compared with the prior exam. Electronically Signed   By: Kathreen Devoid   On: 12/02/2016 16:15   US Renal  Result Date: 12/01/2016 CLINICAL DATA:  Acute renal failure, diabetes, peripheral vascular disease, previous CVA EXAM: RENAL / URINARY TRACT  ULTRASOUND COMPLETE COMPARISON:  Abdominal and pelvic CT scan of July 03, 2014 FINDINGS: Right Kidney: Length: 11.6 cm. The renal cortical echotexture is approximately equal to or slightly greater than that of the adjacent liver. There is no hydronephrosis. Left Kidney: Length: 10.7 cm. The renal cortical echotexture is mildly increased similar to that on the right. There is an upper pole cyst measuring 1.9 x 1.6 x 1.6 cm. There is no hydronephrosis. Bladder: The urinary bladder is decompressed by a Foley catheter. IMPRESSION: Mildly increased renal cortical echotexture consistent with medical renal disease. There is no hydronephrosis. The urinary bladder is decompressed by a Foley catheter. Electronically Signed   By: David  Martinique M.D.   On: 12/01/2016 11:09     LOS: 0 days   Rose Clousing, PAS  [employer]   If 7PM-7AM, please contact night-coverage [email or web address] Password [facility] 12/03/2016, 9:37 AM  Attending MD note  Patient was seen, examined,treatment plan was discussed with the PA-S.  I have personally reviewed the clinical findings, lab, imaging studies and management of this patient in detail. I agree with the documentation, as recorded by the PA-S.   Doing much better-less back pain. Able to raise his leg approximately up to 60-70 today. Seems more comfortable as well  On Exam: Gen. exam: Awake, alert, not in any distress Chest: Good air entry bilaterally, no rhonchi or rales CVS: S1-S2 regular, no murmurs Abdomen: Soft, nontender and nondistended Neurology: Non-focal Skin: No rash or lesions  Impression Intractable low back pain secondary to degenerative disc disease and sciatica-better with initiation of steroids and ongoing supportive care  Plan Continue steroids, have asked RN to minimize IV narcotics as much as possible Ambulate with PT-and may need SNF Placement Await CT of the right hip Suspect that if clinical improvement continues-discharge either home with  home health services or SNF on 1/26   Rest as above  [facility]

## 2016-12-03 NOTE — Evaluation (Signed)
Physical Therapy Evaluation Patient Details Name: Dylan Knight MRN: [id number] DOB: 1924/04/21 Today's Date: 12/03/2016   History of Present Illness   Dylan Knight is a 83 y.o. male with history of CAD, peripheral vascular disease, hypertension, COPD and hypothyroidism presents to the ER because of worsening pain in the low back radiating to right lower extremity.   MRI shows moderate to severe stenosis in all lumbar levels  Clinical Impression  Pt admitted with/for radicular back pain.Marland Kitchen  Pt currently limited functionally due to the problems listed. ( See problems list.)   Pt will benefit from PT to maximize function and safety in order to get ready for next venue listed below.      Follow Up Recommendations SNF    Equipment Recommendations  None recommended by PT;Other (comment) (ultimately an under the mattress rail might by usefull.)    Recommendations for Other Services       Precautions / Restrictions Precautions Precautions: Fall Required Braces or Orthoses: Spinal Brace Spinal Brace: Lumbar corset      Mobility  Bed Mobility Overal bed mobility: Needs Assistance Bed Mobility: Rolling;Sidelying to Sit;Sit to Sidelying Rolling: Min assist Sidelying to sit: Min assist;Min guard     Sit to sidelying: Min assist General bed mobility comments: several practices, but pt guarded and a little painful  Transfers Overall transfer level: Needs assistance   Transfers: Sit to/from Stand Sit to Stand: Min assist;Mod assist         General transfer comment: cues for hand placement  Ambulation/Gait Ambulation/Gait assistance: Min assist Ambulation Distance (Feet): 140 Feet Assistive device: Rolling walker (2 wheeled) Gait Pattern/deviations: Step-through pattern Gait velocity: slower Gait velocity interpretation: Below normal speed for age/gender General Gait Details: short guarded steps, but generally steady  Stairs            Wheelchair Mobility     Modified Rankin (Stroke Patients Only)       Balance Overall balance assessment: Needs assistance   Sitting balance-Leahy Scale: Good     Standing balance support: Bilateral upper extremity supported Standing balance-Leahy Scale: Poor Standing balance comment: reliant on RW                             Pertinent Vitals/Pain Pain Assessment: Faces Faces Pain Scale: Hurts a little bit Pain Location: back and leg R Pain Descriptors / Indicators: Sore;Shooting Pain Intervention(s): Monitored during session;Repositioned    Home Living Family/patient expects to be discharged to:: Private residence Living Arrangements: Spouse/significant other Available Help at Discharge: Family;Available 24 hours/day Type of Home: Apartment Home Access: Level entry     Home Layout: One level Home Equipment: Grab bars - toilet;Grab bars - tub/shower;Hand held shower head;Walker - 4 wheels;Cane - quad;Shower seat - built in      Prior Function Level of Independence: Independent with assistive device(s)         Comments: Is slower with dressing, bathing, but is independent with these.  Pt does not drive, wife does the driving.  Denies any falls in the past 6 months.  Pt ambualting with rollator at all times due to imbalance.  Pt not using O2 at home.  RLE onset of pain after ambulating 3 laps in hallway at home.     Hand Dominance   Dominant Hand: Right    Extremity/Trunk Assessment        Lower Extremity Assessment Lower Extremity Assessment: Overall WFL for tasks assessed (proximal  weaknesses)       Communication   Communication: No difficulties  Cognition Arousal/Alertness: Awake/alert Behavior During Therapy: WFL for tasks assessed/performed Overall Cognitive Status: Within Functional Limits for tasks assessed                      General Comments General comments (skin integrity, edema, etc.): instructed in general back care/precautions, transitions  in bed and progression of activity.    Exercises     Assessment/Plan    PT Assessment Patient needs continued PT services  PT Problem List Decreased strength;Decreased activity tolerance;Decreased balance;Decreased mobility;Decreased knowledge of use of DME;Decreased knowledge of precautions;Pain          PT Treatment Interventions Gait training;DME instruction;Functional mobility training;Therapeutic activities;Patient/family education    PT Goals (Current goals can be found in the Care Plan section)  Acute Rehab PT Goals Patient Stated Goal: ultimately get back home PT Goal Formulation: With patient Time For Goal Achievement: 12/10/16 Potential to Achieve Goals: Good    Frequency Min 3X/week   Barriers to discharge        Co-evaluation               End of Session   Activity Tolerance: Patient tolerated treatment well Patient left: in bed;with call bell/phone within reach;with family/visitor present;Other (comment) (sitting EOB) Nurse Communication: Mobility status    Functional Assessment Tool Used: clinical judgement Functional Limitation: Mobility: Walking and moving around Mobility: Walking and Moving Around Current Status [id number]): At least 20 percent but less than 40 percent impaired, limited or restricted Mobility: Walking and Moving Around Goal Status [phone]): At least 1 percent but less than 20 percent impaired, limited or restricted    Time: 1354-1442 PT Time Calculation (min) (ACUTE ONLY): 48 min   Charges:   PT Evaluation $PT Eval Moderate Complexity: 1 Procedure PT Treatments $Gait Training: 8-22 mins $Therapeutic Activity: 8-22 mins   PT G Codes:   PT G-Codes **NOT FOR INPATIENT CLASS** Functional Assessment Tool Used: clinical judgement Functional Limitation: Mobility: Walking and moving around Mobility: Walking and Moving Around Current Status [id number]): At least 20 percent but less than 40 percent impaired, limited or restricted Mobility:  Walking and Moving Around Goal Status [phone]): At least 1 percent but less than 20 percent impaired, limited or restricted    Burnard Bunting 12/03/2016, 2:52 PM 12/03/2016  Donnella Sham, PT [phone]  (pager)

## 2016-12-03 NOTE — Progress Notes (Signed)
Subjective: 1 Day Post-Op Procedure(s) (LRB): MRI LUMBAR SPINE WITHOUT (N/A) Patient reports pain as mild when in bed, pain gets severe when up.    Objective: Vital signs in last 24 hours: Temp:  [97.7 F (36.5 C)-98.6 F (37 C)] 97.8 F (36.6 C) (01/25 0516) Pulse Rate:  [75-82] 79 (01/25 0516) Resp:  [12-20] 19 (01/25 0516) BP: (122-137)/(58-71) 133/64 (01/25 0516) SpO2:  [93 %-98 %] 98 % (01/25 0516) Weight:  [165 lb 1.6 oz (74.9 kg)] 165 lb 1.6 oz (74.9 kg) (01/24 2200)  Intake/Output from previous day: 01/24 0701 - 01/25 0700 In: 1060 [P.O.:360; I.V.:700] Out: 1350 [Urine:1350] Intake/Output this shift: No intake/output data recorded.   Recent Labs  11/30/16 2153 12/01/16 0446 12/02/16 0031  HGB 16.2 15.5 14.0    Recent Labs  12/01/16 0446 12/02/16 0031  WBC 14.9* 8.4  RBC 4.47 4.10*  HCT 43.2 39.9  PLT 210 169    Recent Labs  12/01/16 0446 12/02/16 0031  NA 128* 130*  K 4.0 3.7  CL 93* 96*  CO2 22 24  BUN 15 15  CREATININE 1.32* 1.11  GLUCOSE 229* 136*  CALCIUM 9.8 9.3    Recent Labs  12/02/16 0031  INR 1.07    PE:  Quads HS, ankle DF,PF strength good.   Mr Lumbar Spine Wo Contrast  Result Date: 12/02/2016 CLINICAL DATA:  [name] with h/o CAD, PVD, HTN, COPD and hypothyroidism. Pt. has had multiple visits since 10/29/16 for severe back pain radiating down to right lower extremity. Came to ED on 11/30/16 for this acute on chronic pain. EXAM: MRI LUMBAR SPINE WITHOUT CONTRAST TECHNIQUE: Multiplanar, multisequence MR imaging of the lumbar spine was performed. No intravenous contrast was administered. COMPARISON:  MR lumbar spine 12/27/2013 FINDINGS: Segmentation:  Standard. Alignment:  Physiologic. Vertebrae:  No fracture, evidence of discitis, or bone lesion. Conus medullaris: Extends to the L1 level and appears normal. Paraspinal and other soft tissues: No paraspinal abnormality. Small stable left renal cyst. Disc levels:  Disc spaces: Degenerative disc disease with disc height loss at L1-2, L3-4, and L4-5 with disc height loss. Prior laminectomy at L3-4. T12-L1: Mild broad-based disc bulge eccentric towards the right with the right lateral component abutting the right extraforaminal T12 nerve root. Mild spinal stenosis. Mild bilateral foraminal stenosis. L1-L2: Broad-based disc bulge with a small broad left paracentral disc protrusion. Mild bilateral facet arthropathy. Moderate spinal stenosis. Left lateral recess stenosis. No evidence of neural foraminal stenosis. L2-L3: Broad-based disc bulge. Moderate bilateral facet arthropathy. Mild spinal stenosis. Moderate right and mild left foraminal stenosis. L3-L4: Broad-based disc bulge. Moderate bilateral facet arthropathy. Mild spinal stenosis. Bilateral lateral recess stenosis. Severe bilateral foraminal stenosis. L4-L5: Broad-based disc bulge. Mild bilateral facet arthropathy. Moderate bilateral foraminal stenosis, left worse than right. L5-S1: Broad-based disc bulge with a small right paracentral disc protrusion contacting the right intraspinal S1 nerve root. Mild bilateral foraminal stenosis. No central canal stenosis. IMPRESSION: 1. At T12-L1 there is a mild broad-based disc bulge eccentric towards the right with the right lateral component abutting the right extraforaminal T12 nerve root. Mild spinal stenosis. Mild bilateral foraminal stenosis. 2. At L1-2 there is a broad-based disc bulge with a small broad left paracentral disc protrusion. Mild bilateral facet arthropathy. Moderate spinal stenosis. Left lateral recess stenosis. 3. At L2-3 there is a broad-based Broad-based disc bulge. Moderate bilateral facet arthropathy. Mild spinal stenosis. Moderate right and mild left foraminal stenosis. 4. At L3-4 there is a  broad-based disc bulge. Moderate bilateral facet arthropathy. Mild spinal stenosis. Bilateral lateral recess stenosis. Severe bilateral foraminal stenosis. 5. At L4-5  there is a broad-based disc bulge. Mild bilateral facet arthropathy. Moderate bilateral foraminal stenosis, left worse than right. 6. At L5-S1 there is a broad-based disc bulge with a small right paracentral disc protrusion contacting the right intraspinal S1 nerve root. Mild bilateral foraminal stenosis. 7. No significant interval change compared with the prior exam. Electronically Signed   By: Kathreen Devoid   On: 12/02/2016 16:15    Assessment/Plan: 1 Day Post-Op Procedure(s) (LRB): MRI LUMBAR SPINE WITHOUT (N/A) Plan:  MRI no change. No area of central stenosis that would cause claudication symptoms with leg weakness. He has some PAD with ABI .66 on right. Left  Is 1.03.   Will order PT, use walker, corset trial to see if it helps. Can followup in office in 2 wks.  Continue HHPT once discharged.     My phone [phone]  Marybelle Killings 12/03/2016, 7:51 AM

## 2016-12-03 NOTE — Progress Notes (Signed)
Orthopedic Tech Progress Note Patient Details:  Dylan Knight Jan 29, 1924 [id number]  Patient ID: Dylan Knight, male   DOB: [birth year], 83 y.o.   MRN: [id number]   Dylan Knight 12/03/2016, 9:14 AM Called in bio-tech brace order; spoke with Bella Kennedy

## 2016-12-03 NOTE — Anesthesia Postprocedure Evaluation (Signed)
Anesthesia Post Note  Patient: Dylan Knight  Procedure(s) Performed: Procedure(s) (LRB): MRI LUMBAR SPINE WITHOUT (N/A)  Patient location during evaluation: PACU Anesthesia Type: General Level of consciousness: awake and alert Pain management: pain level controlled Vital Signs Assessment: post-procedure vital signs reviewed and stable Respiratory status: spontaneous breathing, nonlabored ventilation, respiratory function stable and patient connected to nasal cannula oxygen Cardiovascular status: blood pressure returned to baseline and stable Postop Assessment: no signs of nausea or vomiting Anesthetic complications: no       Last Vitals:  Vitals:   12/03/16 1000 12/03/16 1700  BP: (!) 151/76 140/65  Pulse: 75 77  Resp: 18 18  Temp: 36.8 C 36.9 C    Last Pain:  Vitals:   12/03/16 1700  TempSrc: Oral  PainSc:                  Catalina Gravel

## 2016-12-04 ENCOUNTER — Telehealth (INDEPENDENT_AMBULATORY_CARE_PROVIDER_SITE_OTHER): Payer: Self-pay

## 2016-12-04 DIAGNOSIS — M5441 Lumbago with sciatica, right side: Principal | ICD-10-CM

## 2016-12-04 DIAGNOSIS — G8929 Other chronic pain: Secondary | ICD-10-CM

## 2016-12-04 DIAGNOSIS — M5440 Lumbago with sciatica, unspecified side: Secondary | ICD-10-CM

## 2016-12-04 LAB — GLUCOSE, CAPILLARY
Glucose-Capillary: 118 mg/dL — ABNORMAL HIGH (ref 65–99)
Glucose-Capillary: 130 mg/dL — ABNORMAL HIGH (ref 65–99)

## 2016-12-04 MED ORDER — PREDNISONE 10 MG PO TABS: ORAL_TABLET | ORAL | 0 refills | Status: DC

## 2016-12-04 MED ORDER — DEXAMETHASONE 4 MG PO TABS
4.0000 mg | ORAL_TABLET | Freq: Once | ORAL | Status: AC
  Administered 2016-12-04: 4 mg via ORAL
  Filled 2016-12-04: qty 1

## 2016-12-04 MED ORDER — HYDROCODONE-ACETAMINOPHEN 5-325 MG PO TABS: 1.0000 | ORAL_TABLET | ORAL | 0 refills | Status: DC | PRN

## 2016-12-04 NOTE — NC FL2 (Signed)
[place] LEVEL OF CARE SCREENING TOOL     IDENTIFICATION  Patient Name: Dylan Knight Birthdate: 1924-01-05 Sex: male Admission Date (Current Location): 11/30/2016  [facility] and [place] Number:  Herbalist and Address:  The . [facility], [place] [address] [place], [place] [address]      Provider Number: [id number]  Attending Physician Name and Address:  Jonetta Osgood, MD  Relative Name and Phone Number:  Jerme, Arrison; [phone] (h),  [phone] (mobile)     Current Level of Care: Hospital Recommended Level of Care: [place] Prior Approval Number:    Date Approved/Denied:   PASRR Number: [id number] (Eff. 12/04/16)  Discharge Plan: SNF    Current Diagnoses: Patient Active Problem List   Diagnosis Date Noted  . Low back pain with right-sided sciatica 12/01/2016  . Diabetes mellitus type 2, controlled ([place]) 12/01/2016  . CAD (coronary artery disease) 12/01/2016  . Hypothyroidism 12/01/2016  . Low back pain 12/01/2016  . COPD exacerbation ([place]) 11/02/2016  . Back pain 11/02/2016  . Aftercare following surgery of the circulatory system, [place] 06/28/2013  . Cough 04/18/2013  . COPD (chronic obstructive pulmonary disease) ([place]) 11/22/2012  . Occlusion and stenosis of carotid artery without mention of cerebral infarction 06/29/2012  . Peripheral vascular disease ([place]) 12/30/2011    Orientation RESPIRATION BLADDER Height & Weight     Self, Time, Situation, Place  Normal Continent Weight: 165 lb 2 oz (74.9 kg) Height:  5\' 6"  (167.6 cm)  BEHAVIORAL SYMPTOMS/MOOD NEUROLOGICAL BOWEL NUTRITION STATUS      Continent Diet (Heart healthy - carb modified)  AMBULATORY STATUS COMMUNICATION OF NEEDS Skin   Supervision Verbally Other (Comment) (Right toe amputation)                       Personal Care Assistance Level of Assistance  Bathing, Feeding, Dressing Bathing Assistance: Limited  assistance Feeding assistance: Independent Dressing Assistance: Limited assistance     Functional Limitations Info  Sight, Hearing, Speech Sight Info: Impaired (Patient has history of cataract surgery) Hearing Info: Adequate Speech Info: Adequate    SPECIAL CARE FACTORS FREQUENCY  PT (By licensed PT)     PT Frequency: Evaluated 1/25 and a minimum of 3X per week therapy recommended              Contractures Contractures Info: Not present    Additional Factors Info  Code Status, Allergies, Insulin Sliding Scale Code Status Info: DNR Allergies Info: Iohexol   Insulin Sliding Scale Info: 0-5 Units daily at bedtime; 0-15 Units 3X per day with meals       Current Medications (12/04/2016):  This is the current hospital active medication list Current Facility-Administered Medications  Medication Dose Route Frequency Provider Last Rate Last Dose  . acetaminophen (TYLENOL) tablet 650 mg  650 mg Oral Q6H PRN Rise Patience, MD   650 mg at 12/03/16 2106   Or  . acetaminophen (TYLENOL) suppository 650 mg  650 mg Rectal Q6H PRN Rise Patience, MD      . albuterol (PROVENTIL) (2.5 MG/3ML) 0.083% nebulizer solution 2.5 mg  2.5 mg Inhalation Q6H PRN Rise Patience, MD      . atenolol (TENORMIN) tablet 25 mg  25 mg Oral Daily Rise Patience, MD   25 mg at 12/04/16 1020  . clopidogrel (PLAVIX) tablet 75 mg  75 mg Oral Daily Rise Patience, MD   75 mg at  12/04/16 1020  . dexamethasone (DECADRON) injection 4 mg  4 mg Intravenous Q12H Jonetta Osgood, MD   4 mg at 12/03/16 2106  . ezetimibe-simvastatin (VYTORIN) 10-40 MG per tablet 1 tablet  1 tablet Oral Q M,W,F-1800 Rise Patience, MD   1 tablet at 12/02/16 1734  . fentaNYL (SUBLIMAZE) injection 25-50 mcg  25-50 mcg Intravenous Q5 min PRN Catalina Gravel, MD      . fentaNYL (SUBLIMAZE) injection 50 mcg  50 mcg Intravenous Q2H PRN Thurnell Lose, MD   50 mcg at 12/01/16 1459  . gi cocktail  (Maalox,Lidocaine,Donnatal)  30 mL Oral TID Thurnell Lose, MD   30 mL at 12/04/16 1020  . guaiFENesin (MUCINEX) 12 hr tablet 600 mg  600 mg Oral QPM Rise Patience, MD   600 mg at 12/03/16 1700  . heparin injection 5,000 Units  5,000 Units Subcutaneous Q8H Thurnell Lose, MD   5,000 Units at 12/04/16 0553  . hydrALAZINE (APRESOLINE) injection 10 mg  10 mg Intravenous Q4H PRN Rise Patience, MD      . HYDROcodone-acetaminophen (NORCO/VICODIN) 5-325 MG per tablet 1 tablet  1 tablet Oral Q4H PRN Thurnell Lose, MD   1 tablet at 12/04/16 0553  . HYDROmorphone (DILAUDID) injection 1 mg  1 mg Intravenous Once PRN Thurnell Lose, MD      . insulin aspart (novoLOG) injection 0-15 Units  0-15 Units Subcutaneous TID WC Thurnell Lose, MD   3 Units at 12/03/16 1659  . insulin aspart (novoLOG) injection 0-5 Units  0-5 Units Subcutaneous QHS Thurnell Lose, MD   0 Units at 12/02/16 2146  . levothyroxine (SYNTHROID, LEVOTHROID) tablet 37.5 mcg  37.5 mcg Oral Once per day on Mon Tue Wed Thu Fri Thurnell Lose, MD   37.5 mcg at 12/04/16 0805   And  . levothyroxine (SYNTHROID, LEVOTHROID) tablet 50 mcg  50 mcg Oral Once per day on Sun Sat Thurnell Lose, MD   Stopped at 12/01/16 1241  . lidocaine (LIDODERM) 5 % 1 patch  1 patch Transdermal Q24H Jonetta Osgood, MD   1 patch at 12/04/16 1021  . linagliptin (TRADJENTA) tablet 5 mg  5 mg Oral Daily Rise Patience, MD   5 mg at 12/04/16 1021  . loratadine (CLARITIN) tablet 10 mg  10 mg Oral Daily PRN Rise Patience, MD      . methocarbamol (ROBAXIN) 500 mg in dextrose 5 % 50 mL IVPB  500 mg Intravenous Q6H PRN Rise Patience, MD   500 mg at 12/03/16 0558  . ondansetron (ZOFRAN) injection 4 mg  4 mg Intravenous Q6H PRN Rise Patience, MD      . ondansetron [facility]) injection 4 mg  4 mg Intravenous Once PRN Catalina Gravel, MD      . pantoprazole (PROTONIX) EC tablet 40 mg  40 mg Oral Daily Thurnell Lose, MD   40 mg  at 12/04/16 1021  . polyethylene glycol (MIRALAX / GLYCOLAX) packet 17 g  17 g Oral Daily Jonetta Osgood, MD   17 g at 12/04/16 1021  . senna-docusate (Senokot-S) tablet 2 tablet  2 tablet Oral QHS PRN Jonetta Osgood, MD   2 tablet at 12/03/16 2106  . sodium phosphate (FLEET) 7-19 GM/118ML enema 1 enema  1 enema Rectal Daily PRN Jonetta Osgood, MD      . tamsulosin (FLOMAX) capsule 0.4 mg  0.4 mg Oral Daily Prashant  Jennette Kettle, MD   0.4 mg at 12/04/16 1020     Discharge Medications: Please see discharge summary for a list of discharge medications.  Relevant Imaging Results:  Relevant Lab Results:   Additional Information ss#[id number].  Sable Feil, LCSW

## 2016-12-04 NOTE — Clinical Social Work Note (Signed)
Clinical Social Work Assessment  Patient Details  Name: Dylan Knight MRN: [id number] Date of Birth: 02-05-1924  Date of referral:  12/04/16               Reason for consult:  Facility Placement                Permission sought to share information with:  Family Supports Permission granted to share information::  No (Wife was at the bedside and patient allowed Mrs. Leonardo to talk with CSW)  Name::        Agency::     Relationship::  Wife  Contact Information:  [phone]  Housing/Transportation Living arrangements for the past 2 months:  Apartment Source of Information:  Spouse Patient Interpreter Needed:  None Criminal Activity/Legal Involvement Pertinent to Current Situation/Hospitalization:  No - Comment as needed Significant Relationships:  Adult Children, Spouse Lives with:  Spouse Do you feel safe going back to the place where you live?  No (Patient/wife in agreement with [facility] rehab) Need for family participation in patient care:  Yes (Comment)  Care giving concerns:  Wife reported that due to her health issues, she would not be able to care for patient at discharge. She and patient in agreement with rehab and CSW provided with facility preference - [facility].   Social Worker assessment / plan:  CSW talked with Mrs. Meals at the bedside. A therapist was in the room working with patient and CSW and wife talked during this time. Mrs. Achord reported that therapy will benefit her husband and also help her, as she is primary caregiver. Patient has 4 children and wife has 2 from previous marriages and most live out of town. Wife explained that she had been through a lot recently with the loss of her mother, her own health issues and other concerns.  She was very pleased when informed that [facility] accept her husband. CSW also informed that she would transport patient to facility.  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  Patient/Family's Understanding of and Emotional Response to Diagnosis, Current Treatment, and Prognosis:  Not discussed.  Emotional Assessment Appearance:  Appears stated age Attitude/Demeanor/Rapport:  Other (Appropriate; patient worked with therapist while [place] talked with wife.) Affect (typically observed):  Unable to Assess (Patient was working with therapist while CSW talked with wife) Orientation:  Oriented to Self, Oriented to Place, Oriented to  Time, Oriented to Situation Alcohol / Substance use:  Tobacco Use, Alcohol Use, Illicit Drugs (Patient quit smoking and does not report alcohol or drug use) Psych involvement (Current and /or in the community):  No (Comment)  Discharge Needs  Concerns to be addressed:  Discharge Planning Concerns Readmission within the last 30 days:  No Current discharge risk:  None Barriers to Discharge:  No Barriers Identified   Sable Feil, LCSW 12/04/2016, 4:48 PM

## 2016-12-04 NOTE — Telephone Encounter (Signed)
Dylan Knight was calling to schedule patient a F/U visit, patient is being discharged from hospital today.  Patient can be reached to schedule appointment at [phone].  Thank You

## 2016-12-04 NOTE — Clinical Social Work Placement (Signed)
   CLINICAL SOCIAL WORK PLACEMENT  NOTE 12/04/16 - DISCHARGED TO [facility]  Date:  12/04/2016  Patient Details  Name: Dylan Knight MRN: [id number] Date of Birth: 10-05-1924  Clinical Social Work is seeking post-discharge placement for this patient at the [place] level of care (*CSW will initial, date and re-position this form in  chart as items are completed):  Yes (Wife was presented with list, but did not need as she provided CSW with facility preference)   Patient/family provided with [place] [facility]'s list of facilities offering this level of care within the geographic area requested by the patient (or if unable, by the patient's family).  Yes   Patient/family informed of their freedom to choose among providers that offer the needed level of care, that participate in Medicare, Medicaid or managed care program needed by the patient, have an available bed and are willing to accept the patient.  Yes   Patient/family informed of [facility]'s ownership interest in [facility] and [facility], as well as of the fact that they are under no obligation to receive care at these facilities.  PASRR submitted to EDS on 12/04/16     PASRR number received on 12/04/16     Existing PASRR number confirmed on       FL2 transmitted to all facilities in geographic area requested by pt/family on       FL2 transmitted to all facilities within larger geographic area on       Patient informed that his/her managed care company has contracts with or will negotiate with certain facilities, including the following:        Yes   Patient/family informed of bed offers received.  Patient [facility] bed at [facility]     Physician recommends and patient chooses bed at      Patient to be transferred to [facility] on 12/04/16.  Patient to be transferred to facility by Wife     Patient family notified on 12/04/16 of transfer.  Name of family  member notified:  Spouse, Heywood Footman     PHYSICIAN       Additional Comment:    _______________________________________________ Sable Feil, LCSW 12/04/2016, 5:00 PM

## 2016-12-04 NOTE — Discharge Summary (Signed)
PATIENT DETAILS Name: Dylan Knight Age: 83 y.o. Sex: male Date of Birth: Nov 08, 1924 MRN: [id number]. Admitting Physician: Rise Patience, MD [id number] Shawnie Dapper, MD  Admit Date: 11/30/2016 Discharge date: 12/04/2016  Recommendations for Outpatient Follow-up:  1. Follow up with PCP in 1-2 weeks to continue to assess clinical progress and pain control.   2. Please obtain BMP/CBC in one week.   3. Continue PT evaluation and treatment for min 3x/week at outpatient SNF.   Admitted From:  Home  Disposition: SNF  Home Health: No  Equipment/Devices: None  Discharge Condition: Stable  CODE STATUS: DNR  Diet recommendation:  Heart Healthy and Carb Modified  Brief Summary: See H&P, Labs, Consult and Test reports for all details in brief. [name] with h/o COD, PVD, HTN, COPD and hypothyroidism. Pt. Has had multiple ED visits for acute on chronic severe back pain radiating down the right lower extremity since December 2017. He was admitted on 12/02/15 for MRI under conscious sedation and pain control. MRI WO contrast of lumber spine revealed numerous discs bulging from T12 - S1.  CT of the right hip only showed osteoarthritis. See details below.  Brief Hospital Course: Intractable Low back pain with right-sided sciatica:  MRI WO contrast of lumber spine revealed numerous discs bulging from T12 - S1. CT of the right hip showed osteoarthritis. Patients pain was controlled with hydrocodone-acetaminophen inpatient. Denied any issues with constipation during his stay. IV steroid tx improved pain control further, leading to improved mobility during hospitalization. PT evaluation recommends use of spinal brace and rolling walker (2 wheeled) for minimal assist during ambulation during recovery. Plan is to start taper steroids upon discharge. Discussed rehab options with patient and spouse, both agreed a SNF facility would provide optimal recovery. Please ensure patient  follows up with Dr Ames Coupe  Acute Urinary Retention:  likely due to pain-foley removed on 1/26 am-voiding trial in progress, if he does not void-plans are to reinsert Foley catheter and repeat voiding trial at SNF.   Peripheral vascular disease:  In 2003 AAA stent was placed. Continue atenolol, statin therapy, plavix, and encourage active lifestyle in outpatient setting.    COPD (chronic obstructive pulmonary disease):  Due to 63 pack yr. Hx. No exacerbation during this hospital stay, stable O2 saturations on RA. Continue inhaler regimen in outpatient setting.    Diabetes mellitus type 2, controlled:  Last HgbA1C was 6.7. Continue JANUMET upon discharge. Recommended outpatient heart healthy and carb modified diet.     Essential HTN:  Controlled, continue TENORETIC and BP management with PCP outpatient.   Hypothyroidism:  Continue levothyroxine.    GERD:  Had flare up of "chest burning" in ED , was given GI cocktail (maalox, Lidocaine, Donnatal). Troponin was <0.03. Patient has not had any more episodes of this chest pain during this hospital stay. Continue protonix as outpatient.   Procedures/Studies: CT Right hip WO Contrast MR Lumbar Spine WO Contrast    Discharge Diagnoses:  Principal Problem:   Low back pain with right-sided sciatica Active Problems:   Peripheral vascular disease (HCC)   COPD (chronic obstructive pulmonary disease) (HCC)   Diabetes mellitus type 2, controlled (HCC)   CAD (coronary artery disease)   Hypothyroidism   Low back pain   Discharge Instructions: Check CBG's AC and HS  Activity:  As tolerated with Full fall precautions.  Use walker/cane & assistance as needed. Use Spinal Brace with ambulation.  PT evaluation states continue PT min 3x/week  during rehabilitation.   Discharge Instructions    Diet - low sodium heart healthy    Complete by:  As directed    Diet Carb Modified    Complete by:  As directed    Increase activity  slowly    Complete by:  As directed      Allergies as of 12/04/2016      Reactions   Iohexol Hives   Went away with benadryl   Iodine Rash      Medication List    TAKE these medications   acetaminophen 325 MG tablet Commonly known as:  TYLENOL Take 650 mg by mouth every 6 (six) hours as needed for mild pain.   albuterol 108 (90 Base) MCG/ACT inhaler Commonly known as:  PROVENTIL HFA;VENTOLIN HFA Inhale 2 puffs into the lungs every 6 (six) hours as needed for wheezing or shortness of breath.   atenolol-chlorthalidone 50-25 MG tablet Commonly known as:  TENORETIC Take 0.5 tablets by mouth daily.   clopidogrel 75 MG tablet Commonly known as:  PLAVIX Take 75 mg by mouth daily.   ezetimibe-simvastatin 10-40 MG tablet Commonly known as:  VYTORIN Take 1 tablet by mouth See admin instructions. Take Monday, Wednesday, and Friday in evening.   guaiFENesin 600 MG 12 hr tablet Commonly known as:  MUCINEX Take 600 mg by mouth every evening.   HYDROcodone-acetaminophen 5-325 MG tablet Commonly known as:  NORCO/VICODIN Take 1 tablet by mouth every 4 (four) hours as needed for moderate pain.   levothyroxine 25 MCG tablet Commonly known as:  SYNTHROID, LEVOTHROID 1.5-2 tablets every morning. Take 1.5 tablet Monday-Friday, and 2 tabs on Saturday and Sunday.   loratadine 10 MG tablet Commonly known as:  CLARITIN Take 10 mg by mouth daily as needed for allergies. Reported on 01/03/2016   OCUVITE PRESERVISION PO Take 1 tablet by mouth 2 (two) times daily.   CENTRUM SILVER PO Take 1 tablet by mouth daily.   predniSONE 10 MG tablet Commonly known as:  DELTASONE Take 4 tablets (40 mg) daily for 2 days, then, Take 3 tablets (30 mg) daily for 2 days, then, Take 2 tablets (20 mg) daily for 2 days, then, Take 1 tablets (10 mg) daily for 1 days, then stop   sitaGLIPtin-metformin 50-500 MG tablet Commonly known as:  JANUMET Take 1 tablet by mouth 2 (two) times daily with a meal.       Follow-up Information    Maximino Greenland, MD In 1 week.   Specialty:  Internal Medicine Why:  Appointment date with Dr. Gala Murdoch Nurse Practitioner on 12/16/2016 at 9:30a  Contact information: [address] [place] [address] [id number]        Marybelle Killings, MD. Schedule an appointment as soon as possible for a visit in 2 weeks.   Specialty:  Orthopedic Surgery Contact information: [address] [place] [address] [phone]          Allergies  Allergen Reactions  . Iohexol Hives    Went away with benadryl   . Iodine Rash    Consultations:  Orthopedics  Other Procedures/Studies: Dg Pelvis 1-2 Views  Result Date: 12/01/2016 CLINICAL DATA:  Pain right hip.  No injury. EXAM: PELVIS - 1-2 VIEW COMPARISON:  11/02/2016.  CT 07/03/2014. FINDINGS: Degenerative changes lumbar spine and both hips. No acute bony or joint abnormality identified. Aortoiliac and femoral atherosclerotic vascular calcification. Postsurgical change noted left pelvis. IMPRESSION: 1. Degenerative changes lumbar spine and both hips. Diffuse osteopenia.  No acute abnormality. 2.  Aortoiliac and peripheral vascular disease. Electronically Signed   By: Marcello Moores  Register   On: 12/01/2016 16:37   Mr Lumbar Spine Wo Contrast  Result Date: 12/02/2016 CLINICAL DATA:  [name] with h/o CAD, PVD, HTN, COPD and hypothyroidism. Pt. has had multiple visits since 10/29/16 for severe back pain radiating down to right lower extremity. Came to ED on 11/30/16 for this acute on chronic pain. EXAM: MRI LUMBAR SPINE WITHOUT CONTRAST TECHNIQUE: Multiplanar, multisequence MR imaging of the lumbar spine was performed. No intravenous contrast was administered. COMPARISON:  MR lumbar spine 12/27/2013 FINDINGS: Segmentation:  Standard. Alignment:  Physiologic. Vertebrae:  No fracture, evidence of discitis, or bone lesion. Conus medullaris: Extends to the L1 level and appears normal.  Paraspinal and other soft tissues: No paraspinal abnormality. Small stable left renal cyst. Disc levels: Disc spaces: Degenerative disc disease with disc height loss at L1-2, L3-4, and L4-5 with disc height loss. Prior laminectomy at L3-4. T12-L1: Mild broad-based disc bulge eccentric towards the right with the right lateral component abutting the right extraforaminal T12 nerve root. Mild spinal stenosis. Mild bilateral foraminal stenosis. L1-L2: Broad-based disc bulge with a small broad left paracentral disc protrusion. Mild bilateral facet arthropathy. Moderate spinal stenosis. Left lateral recess stenosis. No evidence of neural foraminal stenosis. L2-L3: Broad-based disc bulge. Moderate bilateral facet arthropathy. Mild spinal stenosis. Moderate right and mild left foraminal stenosis. L3-L4: Broad-based disc bulge. Moderate bilateral facet arthropathy. Mild spinal stenosis. Bilateral lateral recess stenosis. Severe bilateral foraminal stenosis. L4-L5: Broad-based disc bulge. Mild bilateral facet arthropathy. Moderate bilateral foraminal stenosis, left worse than right. L5-S1: Broad-based disc bulge with a small right paracentral disc protrusion contacting the right intraspinal S1 nerve root. Mild bilateral foraminal stenosis. No central canal stenosis. IMPRESSION: 1. At T12-L1 there is a mild broad-based disc bulge eccentric towards the right with the right lateral component abutting the right extraforaminal T12 nerve root. Mild spinal stenosis. Mild bilateral foraminal stenosis. 2. At L1-2 there is a broad-based disc bulge with a small broad left paracentral disc protrusion. Mild bilateral facet arthropathy. Moderate spinal stenosis. Left lateral recess stenosis. 3. At L2-3 there is a broad-based Broad-based disc bulge. Moderate bilateral facet arthropathy. Mild spinal stenosis. Moderate right and mild left foraminal stenosis. 4. At L3-4 there is a broad-based disc bulge. Moderate bilateral facet arthropathy.  Mild spinal stenosis. Bilateral lateral recess stenosis. Severe bilateral foraminal stenosis. 5. At L4-5 there is a broad-based disc bulge. Mild bilateral facet arthropathy. Moderate bilateral foraminal stenosis, left worse than right. 6. At L5-S1 there is a broad-based disc bulge with a small right paracentral disc protrusion contacting the right intraspinal S1 nerve root. Mild bilateral foraminal stenosis. 7. No significant interval change compared with the prior exam. Electronically Signed   By: Kathreen Devoid   On: 12/02/2016 16:15   US Renal  Result Date: 12/01/2016 CLINICAL DATA:  Acute renal failure, diabetes, peripheral vascular disease, previous CVA EXAM: RENAL / URINARY TRACT ULTRASOUND COMPLETE COMPARISON:  Abdominal and pelvic CT scan of July 03, 2014 FINDINGS: Right Kidney: Length: 11.6 cm. The renal cortical echotexture is approximately equal to or slightly greater than that of the adjacent liver. There is no hydronephrosis. Left Kidney: Length: 10.7 cm. The renal cortical echotexture is mildly increased similar to that on the right. There is an upper pole cyst measuring 1.9 x 1.6 x 1.6 cm. There is no hydronephrosis. Bladder: The urinary bladder is decompressed by a Foley catheter.  IMPRESSION: Mildly increased renal cortical echotexture consistent with medical renal disease. There is no hydronephrosis. The urinary bladder is decompressed by a Foley catheter. Electronically Signed   By: David  Martinique M.D.   On: 12/01/2016 11:09   Ct Hip Right Wo Contrast  Result Date: 12/03/2016 CLINICAL DATA:  Chronic right hip pain.  No known injury. EXAM: CT OF THE RIGHT HIP WITHOUT CONTRAST TECHNIQUE: Multidetector CT imaging of the right hip was performed according to the standard protocol. Multiplanar CT image reconstructions were also generated. COMPARISON:  Single-view of the pelvis 12/01/2016. CT abdomen and pelvis 07/03/2014. FINDINGS: Bones/Joint/Cartilage The right hip is located. No fracture is  identified. Chondrocalcinosis about the joint is noted. Joint space is preserved. There is no avascular necrosis of the femoral head. No lytic or sclerotic bony lesion is identified. Right sacroiliac joint appears normal. Ligaments Suboptimally assessed by CT. Muscles and Tendons Intact. Soft tissues Foley catheter is in place in a decompressed urinary bladder. Atherosclerotic vascular disease is seen. IMPRESSION: Negative for fracture or other acute abnormality. Minimal degenerative disease right hip. Chondrocalcinosis also noted. Atherosclerosis. Electronically Signed   By: Inge Rise M.D.   On: 12/03/2016 10:02     TODAY-DAY OF DISCHARGE:  Subjective:   Dylan Knight today has no headache,no chest abdominal pain,no new weakness tingling or numbness, feels much better. Discussed outpatient plans, and both patient and wife agreed discharge to SNF is what is best for the patient's recovery.   Objective:   Blood pressure (!) 143/74, pulse 82, temperature 97.5 F (36.4 C), temperature source Oral, resp. rate 18, height 5\' 6"  (1.676 m), weight 74.9 kg (165 lb 2 oz), SpO2 100 %.  Intake/Output Summary (Last 24 hours) at 12/04/16 1024 Last data filed at 12/04/16 0900  Gross per 24 hour  Intake             1080 ml  Output             3225 ml  Net            -2145 ml   Filed Weights   12/01/16 1746 12/02/16 2200 12/03/16 2145  Weight: 74.8 kg (165 lb) 74.9 kg (165 lb 1.6 oz) 74.9 kg (165 lb 2 oz)    Exam: General: Speech is clear, appears non-toxic HEENT: [place].AT,PERRLA, Supple Neck,No JVD, No cervical lymphadenopathy appriciated.  Pulmonary: Symmetrical Chest wall movement, Good air movement bilaterally, CTAB Cardiac: RRR,No Gallops,Rubs or new Murmurs, No Parasternal Heave GI: (+) Bowel sounds, Abd Soft, Non tender, No organomegaly appriciated, No rebound -guarding or rigidity. Skin: No Cyanosis, Clubbing or edema, No new Rash or bruise Neuro: AAOX3, No new F.N deficits, Normal  affect  PERTINENT RADIOLOGIC STUDIES: Dg Pelvis 1-2 Views  Result Date: 12/01/2016 CLINICAL DATA:  Pain right hip.  No injury. EXAM: PELVIS - 1-2 VIEW COMPARISON:  11/02/2016.  CT 07/03/2014. FINDINGS: Degenerative changes lumbar spine and both hips. No acute bony or joint abnormality identified. Aortoiliac and femoral atherosclerotic vascular calcification. Postsurgical change noted left pelvis. IMPRESSION: 1. Degenerative changes lumbar spine and both hips. Diffuse osteopenia. No acute abnormality. 2.  Aortoiliac and peripheral vascular disease. Electronically Signed   By: Marcello Moores  Register   On: 12/01/2016 16:37   Mr Lumbar Spine Wo Contrast  Result Date: 12/02/2016 CLINICAL DATA:  [name] with h/o CAD, PVD, HTN, COPD and hypothyroidism. Pt. has had multiple visits since 10/29/16 for severe back pain radiating down to right lower extremity. Came to ED  on 11/30/16 for this acute on chronic pain. EXAM: MRI LUMBAR SPINE WITHOUT CONTRAST TECHNIQUE: Multiplanar, multisequence MR imaging of the lumbar spine was performed. No intravenous contrast was administered. COMPARISON:  MR lumbar spine 12/27/2013 FINDINGS: Segmentation:  Standard. Alignment:  Physiologic. Vertebrae:  No fracture, evidence of discitis, or bone lesion. Conus medullaris: Extends to the L1 level and appears normal. Paraspinal and other soft tissues: No paraspinal abnormality. Small stable left renal cyst. Disc levels: Disc spaces: Degenerative disc disease with disc height loss at L1-2, L3-4, and L4-5 with disc height loss. Prior laminectomy at L3-4. T12-L1: Mild broad-based disc bulge eccentric towards the right with the right lateral component abutting the right extraforaminal T12 nerve root. Mild spinal stenosis. Mild bilateral foraminal stenosis. L1-L2: Broad-based disc bulge with a small broad left paracentral disc protrusion. Mild bilateral facet arthropathy. Moderate spinal stenosis. Left lateral recess stenosis. No  evidence of neural foraminal stenosis. L2-L3: Broad-based disc bulge. Moderate bilateral facet arthropathy. Mild spinal stenosis. Moderate right and mild left foraminal stenosis. L3-L4: Broad-based disc bulge. Moderate bilateral facet arthropathy. Mild spinal stenosis. Bilateral lateral recess stenosis. Severe bilateral foraminal stenosis. L4-L5: Broad-based disc bulge. Mild bilateral facet arthropathy. Moderate bilateral foraminal stenosis, left worse than right. L5-S1: Broad-based disc bulge with a small right paracentral disc protrusion contacting the right intraspinal S1 nerve root. Mild bilateral foraminal stenosis. No central canal stenosis. IMPRESSION: 1. At T12-L1 there is a mild broad-based disc bulge eccentric towards the right with the right lateral component abutting the right extraforaminal T12 nerve root. Mild spinal stenosis. Mild bilateral foraminal stenosis. 2. At L1-2 there is a broad-based disc bulge with a small broad left paracentral disc protrusion. Mild bilateral facet arthropathy. Moderate spinal stenosis. Left lateral recess stenosis. 3. At L2-3 there is a broad-based Broad-based disc bulge. Moderate bilateral facet arthropathy. Mild spinal stenosis. Moderate right and mild left foraminal stenosis. 4. At L3-4 there is a broad-based disc bulge. Moderate bilateral facet arthropathy. Mild spinal stenosis. Bilateral lateral recess stenosis. Severe bilateral foraminal stenosis. 5. At L4-5 there is a broad-based disc bulge. Mild bilateral facet arthropathy. Moderate bilateral foraminal stenosis, left worse than right. 6. At L5-S1 there is a broad-based disc bulge with a small right paracentral disc protrusion contacting the right intraspinal S1 nerve root. Mild bilateral foraminal stenosis. 7. No significant interval change compared with the prior exam. Electronically Signed   By: Kathreen Devoid   On: 12/02/2016 16:15   US Renal  Result Date: 12/01/2016 CLINICAL DATA:  Acute renal failure,  diabetes, peripheral vascular disease, previous CVA EXAM: RENAL / URINARY TRACT ULTRASOUND COMPLETE COMPARISON:  Abdominal and pelvic CT scan of July 03, 2014 FINDINGS: Right Kidney: Length: 11.6 cm. The renal cortical echotexture is approximately equal to or slightly greater than that of the adjacent liver. There is no hydronephrosis. Left Kidney: Length: 10.7 cm. The renal cortical echotexture is mildly increased similar to that on the right. There is an upper pole cyst measuring 1.9 x 1.6 x 1.6 cm. There is no hydronephrosis. Bladder: The urinary bladder is decompressed by a Foley catheter. IMPRESSION: Mildly increased renal cortical echotexture consistent with medical renal disease. There is no hydronephrosis. The urinary bladder is decompressed by a Foley catheter. Electronically Signed   By: David  Martinique M.D.   On: 12/01/2016 11:09   Ct Hip Right Wo Contrast  Result Date: 12/03/2016 CLINICAL DATA:  Chronic right hip pain.  No known injury. EXAM: CT OF THE RIGHT HIP WITHOUT CONTRAST TECHNIQUE: Multidetector CT  imaging of the right hip was performed according to the standard protocol. Multiplanar CT image reconstructions were also generated. COMPARISON:  Single-view of the pelvis 12/01/2016. CT abdomen and pelvis 07/03/2014. FINDINGS: Bones/Joint/Cartilage The right hip is located. No fracture is identified. Chondrocalcinosis about the joint is noted. Joint space is preserved. There is no avascular necrosis of the femoral head. No lytic or sclerotic bony lesion is identified. Right sacroiliac joint appears normal. Ligaments Suboptimally assessed by CT. Muscles and Tendons Intact. Soft tissues Foley catheter is in place in a decompressed urinary bladder. Atherosclerotic vascular disease is seen. IMPRESSION: Negative for fracture or other acute abnormality. Minimal degenerative disease right hip. Chondrocalcinosis also noted. Atherosclerosis. Electronically Signed   By: Inge Rise M.D.   On:  12/03/2016 10:02     PERTINENT LAB RESULTS: CBC:  Recent Labs  12/02/16 0031  WBC 8.4  HGB 14.0  HCT 39.9  PLT 169   CMET CMP     Component Value Date/Time   NA 130 (L) 12/02/2016 0031   NA 137 11/25/2014 0526   K 3.7 12/02/2016 0031   K 3.7 11/25/2014 0526   CL 96 (L) 12/02/2016 0031   CL 100 11/25/2014 0526   CO2 24 12/02/2016 0031   CO2 29 11/25/2014 0526   GLUCOSE 136 (H) 12/02/2016 0031   GLUCOSE 144 (H) 11/25/2014 0526   BUN 15 12/02/2016 0031   BUN 9 11/25/2014 0526   CREATININE 1.11 12/02/2016 0031   CREATININE 1.22 11/25/2014 0526   CALCIUM 9.3 12/02/2016 0031   CALCIUM 9.5 11/25/2014 0526   PROT 7.0 11/02/2016 1021   PROT 6.9 11/24/2014 1359   ALBUMIN 4.4 11/02/2016 1021   ALBUMIN 3.9 11/24/2014 1359   AST 40 11/02/2016 1021   AST 20 11/24/2014 1359   ALT 18 11/02/2016 1021   ALT 25 11/24/2014 1359   ALKPHOS 46 11/02/2016 1021   ALKPHOS 51 11/24/2014 1359   BILITOT 1.4 (H) 11/02/2016 1021   BILITOT 0.9 11/24/2014 1359   GFRNONAA 56 (L) 12/02/2016 0031   GFRNONAA 60 (L) 11/25/2014 0526   GFRNONAA 58 (L) 10/10/2013 0435   GFRAA >60 12/02/2016 0031   GFRAA >60 11/25/2014 0526   GFRAA >60 10/10/2013 0435    GFR Estimated Creatinine Clearance: 39.9 mL/min (by C-G formula based on SCr of 1.11 mg/dL). No results for input(s): LIPASE, AMYLASE in the last 72 hours.  Recent Labs  12/01/16 1804 12/02/16 0031  TROPONINI <0.03 <0.03   Invalid input(s): POCBNP No results for input(s): DDIMER in the last 72 hours. No results for input(s): HGBA1C in the last 72 hours. No results for input(s): CHOL, HDL, LDLCALC, TRIG, CHOLHDL, LDLDIRECT in the last 72 hours. No results for input(s): TSH, T4TOTAL, T3FREE, THYROIDAB in the last 72 hours.  Invalid input(s): FREET3 No results for input(s): VITAMINB12, FOLATE, FERRITIN, TIBC, IRON, RETICCTPCT in the last 72 hours. Coags:  Recent Labs  12/02/16 0031  INR 1.07   Microbiology: No results found for  this or any previous visit (from the past 240 hour(s)).  FURTHER DISCHARGE INSTRUCTIONS:  Get Medicines reviewed and adjusted: Please take all your medications with you for your next visit with your Primary MD  Laboratory/radiological data: Please request your Primary MD to go over all hospital tests and procedure/radiological results at the follow up, please ask your Primary MD to get all Hospital records sent to his/her office.  In some cases, they will be blood work, cultures and biopsy results pending at the time of  your discharge. Please request that your primary care M.D. goes through all the records of your hospital data and follows up on these results.  Also Note the following: If you experience worsening of your admission symptoms, develop shortness of breath, life threatening emergency, suicidal or homicidal thoughts you must seek medical attention immediately by calling 911 or calling your MD immediately  if symptoms less severe.  You must read complete instructions/literature along with all the possible adverse reactions/side effects for all the Medicines you take and that have been prescribed to you. Take any new Medicines after you have completely understood and accpet all the possible adverse reactions/side effects.   Do not drive when taking Pain medications or sleeping medications (Benzodaizepines)  Do not take more than prescribed Pain, Sleep and Anxiety Medications. It is not advisable to combine anxiety,sleep and pain medications without talking with your primary care practitioner  Special Instructions: If you have smoked or chewed Tobacco  in the last 2 yrs please stop smoking, stop any regular Alcohol  and or any Recreational drug use.  Wear Seat belts while driving.  Please note: You were cared for by a hospitalist during your hospital stay. Once you are discharged, your primary care physician will handle any further medical issues. Please note that NO REFILLS for any  discharge medications will be authorized once you are discharged, as it is imperative that you return to your primary care physician (or establish a relationship with a primary care physician if you do not have one) for your post hospital discharge needs so that they can reassess your need for medications and monitor your lab values.  Total Time spent coordinating discharge including counseling, education and face to face time equals 45 minutes.  Signed: Lelon Mast, PAS  [place] [place]   12/04/2016 10:24 AM   Attending MD note  Patient was seen, examined,treatment plan was discussed with the PA-S.  I have personally reviewed the clinical findings, lab, imaging studies and management of this patient in detail. I agree with the documentation, as recorded by the PA-S.   Patient continues to do well-pain is well controlled with oral narcotics. Agreeable to go to SNF.  On Exam: Gen. exam: Awake, alert, not in any distress Chest: Good air entry bilaterally, no rhonchi or rales CVS: S1-S2 regular, no murmurs Abdomen: Soft, nontender and nondistended Neurology: Non-focal Skin: No rash or lesions  Impression Intractable low back pain secondary to degenerative disc disease and sciatica-better with initiation of steroids and ongoing supportive care  Plan Remove Foley today Continue supportive measures Spoke with social work early this am-ready for SNF Taper steroids in 1 week   Rest as above  [employer] [facility]

## 2016-12-04 NOTE — Progress Notes (Addendum)
Disposition: SNF Discharge instruction and information reviewed with pt and wife; prescriptions, medications discussed, verbalized understanding;  IV: discontinued before discharged; site clean, dry and intact. Tele: discontinued; CCMD notified. Transportation: pt escorted out of the unit in wheelchair accompanied by wife. Belongings: pt and wife took all belongings including the lumbar corsett, prescription and DNR form with them. Wife drove pt to [facility].

## 2016-12-04 NOTE — Consult Note (Signed)
Reason for Consult: Low back pain inability to ambulate. Referring Physician:    Nena Alexander MD  Dylan Knight is an 83 y.o. male.  HPI: -year-old male that been followed by me last seen in the office on 11/17/2016. He's had problems with the severe back pain difficulty ambulating and has been a mature with a walker. He's had multiple trips emergency room with the pain. Said previous history of CVA 2005 at extensive therapy. He states he has pain when he stands uses a rolling walker states his pain has been intermittent and primarily radiates down the right leg. He denied any associated bowel or bladder symptoms. Since his admitted the hospital is had difficulty voiding and had a Foley catheter placed. Attempt to do an MRI last week was unsuccessful and conscious sedation was recommended. This was being scheduled with conscious sedation and due to increase in pain and difficulty getting to the bathroom he presented to the emergency room and was admitted. MRI scan has been done and is available for review. Patient's had previous carotid endarterectomy with patch done by Dr. Scot Dock 2005. Had a carotid stent. This lumbar decompression surgery by Dr. Lorin Mercy L3-4 in proximally 1998. Patient has known PAD with the left leg being 0.66 and right leg 1.03.  Past Medical History:  Diagnosis Date  . Arthritis    left hip  . BPH (benign prostatic hyperplasia)   . Cancer [facility])    Skin cancers removed  . Carotid artery occlusion   . COPD (chronic obstructive pulmonary disease) ([place])   . Diabetes mellitus 2011   Border line  . Diabetes mellitus without complication ([place])   . Hyperlipidemia   . Hypertension   . Hypothyroidism   . Macular degeneration   . Peripheral arterial disease ([place]) 2002  . Peripheral vascular disease ([place])   . Peripheral vascular disease ([facility])    PAD  . Stroke ([place]) 2005  . Stroke [facility])    2005 - Dr Michael Boston - [place]    Past Surgical History:  Procedure  Laterality Date  . ABDOMINAL AORTIC ANEURYSM REPAIR  2003   Aorto-left femoral-right iliac BPG by Dr. Scot Dock  . Aortic aneursym surgery     2003- Dr. Scot Dock  . BACK SURGERY     1960  . CAROTID ENDARTERECTOMY  Jan. 25,2007   Right CEA  . Carotid stenosis surgery on right     2007  . CARPAL TUNNEL RELEASE Left 06/02/2016   Procedure: LEFT CARPAL TUNNEL RELEASE;  Surgeon: Daryll Brod, MD;  Location: [place];  Service: Orthopedics;  Laterality: Left;  . EYE SURGERY     Catarct surgery and lens implant - bilateral  . PR VEIN BYPASS GRAFT,AORTO-FEM-POP  2005   Revision Right Fem-pop  . PR VEIN BYPASS GRAFT,AORTO-FEM-POP  2002   Right Fem-pop  . RADIOLOGY WITH ANESTHESIA N/A 12/02/2016   Procedure: MRI LUMBAR SPINE WITHOUT;  Surgeon: Medication Radiologist, MD;  Location: [place];  Service: Radiology;  Laterality: N/A;  . Right leg bypass     2002 -  and amputation of right fifth toe  . SPINE SURGERY  1960   lumbar spine  . [place], 2007   cervical spine  . TOE AMPUTATION  08-2001   right 5th toe amputation    Family History  Problem Relation Age of Onset  . Stroke Mother   . Hypertension Mother   . Heart disease Father   . Heart attack  Father   . Cancer Brother   . Hypertension Brother     Social History:  reports that he quit smoking about 12 years ago. His smoking use included Cigarettes. He has a 63.00 pack-year smoking history. He has quit using smokeless tobacco. He reports that he does not drink alcohol or use drugs.  Allergies:  Allergies  Allergen Reactions  . Iohexol Hives    Went away with benadryl   . Iodine Rash    Medications: I have reviewed the patient's current medications.  Results for orders placed or performed during the hospital encounter of 11/30/16 (from the past 48 hour(s))  Glucose, capillary     Status: Abnormal   Collection Time: 12/02/16 12:25 PM  Result Value Ref Range   Glucose-Capillary 132 (H) 65 - 99 mg/dL    Glucose, capillary     Status: Abnormal   Collection Time: 12/02/16  4:40 PM  Result Value Ref Range   Glucose-Capillary 118 (H) 65 - 99 mg/dL  Glucose, capillary     Status: Abnormal   Collection Time: 12/02/16  9:59 PM  Result Value Ref Range   Glucose-Capillary 183 (H) 65 - 99 mg/dL  Glucose, capillary     Status: Abnormal   Collection Time: 12/03/16  7:32 AM  Result Value Ref Range   Glucose-Capillary 251 (H) 65 - 99 mg/dL  Glucose, capillary     Status: Abnormal   Collection Time: 12/03/16 11:58 AM  Result Value Ref Range   Glucose-Capillary 129 (H) 65 - 99 mg/dL  Glucose, capillary     Status: Abnormal   Collection Time: 12/03/16  4:47 PM  Result Value Ref Range   Glucose-Capillary 196 (H) 65 - 99 mg/dL  Glucose, capillary     Status: Abnormal   Collection Time: 12/03/16  9:33 PM  Result Value Ref Range   Glucose-Capillary 127 (H) 65 - 99 mg/dL  Glucose, capillary     Status: Abnormal   Collection Time: 12/04/16  7:55 AM  Result Value Ref Range   Glucose-Capillary 118 (H) 65 - 99 mg/dL    Mr Lumbar Spine Wo Contrast  Result Date: 12/02/2016 CLINICAL DATA:  [name] with h/o CAD, PVD, HTN, COPD and hypothyroidism. Pt. has had multiple visits since 10/29/16 for severe back pain radiating down to right lower extremity. Came to ED on 11/30/16 for this acute on chronic pain. EXAM: MRI LUMBAR SPINE WITHOUT CONTRAST TECHNIQUE: Multiplanar, multisequence MR imaging of the lumbar spine was performed. No intravenous contrast was administered. COMPARISON:  MR lumbar spine 12/27/2013 FINDINGS: Segmentation:  Standard. Alignment:  Physiologic. Vertebrae:  No fracture, evidence of discitis, or bone lesion. Conus medullaris: Extends to the L1 level and appears normal. Paraspinal and other soft tissues: No paraspinal abnormality. Small stable left renal cyst. Disc levels: Disc spaces: Degenerative disc disease with disc height loss at L1-2, L3-4, and L4-5 with disc height  loss. Prior laminectomy at L3-4. T12-L1: Mild broad-based disc bulge eccentric towards the right with the right lateral component abutting the right extraforaminal T12 nerve root. Mild spinal stenosis. Mild bilateral foraminal stenosis. L1-L2: Broad-based disc bulge with a small broad left paracentral disc protrusion. Mild bilateral facet arthropathy. Moderate spinal stenosis. Left lateral recess stenosis. No evidence of neural foraminal stenosis. L2-L3: Broad-based disc bulge. Moderate bilateral facet arthropathy. Mild spinal stenosis. Moderate right and mild left foraminal stenosis. L3-L4: Broad-based disc bulge. Moderate bilateral facet arthropathy. Mild spinal stenosis. Bilateral lateral recess stenosis. Severe bilateral foraminal stenosis.  L4-L5: Broad-based disc bulge. Mild bilateral facet arthropathy. Moderate bilateral foraminal stenosis, left worse than right. L5-S1: Broad-based disc bulge with a small right paracentral disc protrusion contacting the right intraspinal S1 nerve root. Mild bilateral foraminal stenosis. No central canal stenosis. IMPRESSION: 1. At T12-L1 there is a mild broad-based disc bulge eccentric towards the right with the right lateral component abutting the right extraforaminal T12 nerve root. Mild spinal stenosis. Mild bilateral foraminal stenosis. 2. At L1-2 there is a broad-based disc bulge with a small broad left paracentral disc protrusion. Mild bilateral facet arthropathy. Moderate spinal stenosis. Left lateral recess stenosis. 3. At L2-3 there is a broad-based Broad-based disc bulge. Moderate bilateral facet arthropathy. Mild spinal stenosis. Moderate right and mild left foraminal stenosis. 4. At L3-4 there is a broad-based disc bulge. Moderate bilateral facet arthropathy. Mild spinal stenosis. Bilateral lateral recess stenosis. Severe bilateral foraminal stenosis. 5. At L4-5 there is a broad-based disc bulge. Mild bilateral facet arthropathy. Moderate bilateral foraminal  stenosis, left worse than right. 6. At L5-S1 there is a broad-based disc bulge with a small right paracentral disc protrusion contacting the right intraspinal S1 nerve root. Mild bilateral foraminal stenosis. 7. No significant interval change compared with the prior exam. Electronically Signed   By: Kathreen Devoid   On: 12/02/2016 16:15   Ct Hip Right Wo Contrast  Result Date: 12/03/2016 CLINICAL DATA:  Chronic right hip pain.  No known injury. EXAM: CT OF THE RIGHT HIP WITHOUT CONTRAST TECHNIQUE: Multidetector CT imaging of the right hip was performed according to the standard protocol. Multiplanar CT image reconstructions were also generated. COMPARISON:  Single-view of the pelvis 12/01/2016. CT abdomen and pelvis 07/03/2014. FINDINGS: Bones/Joint/Cartilage The right hip is located. No fracture is identified. Chondrocalcinosis about the joint is noted. Joint space is preserved. There is no avascular necrosis of the femoral head. No lytic or sclerotic bony lesion is identified. Right sacroiliac joint appears normal. Ligaments Suboptimally assessed by CT. Muscles and Tendons Intact. Soft tissues Foley catheter is in place in a decompressed urinary bladder. Atherosclerotic vascular disease is seen. IMPRESSION: Negative for fracture or other acute abnormality. Minimal degenerative disease right hip. Chondrocalcinosis also noted. Atherosclerosis. Electronically Signed   By: Inge Rise M.D.   On: 12/03/2016 10:02    Review of Systems  Constitutional: Negative for chills and fever.  HENT: Negative for hearing loss.   Eyes:       Last this  Cardiovascular:       Positive pre-previous CVA previous aorto femoropopliteal surgery with later revision. Previous lumbar surgery cervical fusion previous toe amputation.  Genitourinary:       Current Foley catheter in place  Musculoskeletal: Positive for back pain and neck pain. Negative for falls.  Skin: Negative for rash.  Neurological: Negative for dizziness  and headaches.  Psychiatric/Behavioral: Negative for hallucinations. The patient does not have insomnia.    Blood pressure (!) 143/74, pulse 82, temperature 97.5 F (36.4 C), temperature source Oral, resp. rate 18, height 5\' 6"  (1.676 m), weight 165 lb 2 oz (74.9 kg), SpO2 100 %. Physical Exam  Constitutional: He appears well-developed and well-nourished. No distress.  HENT:  Head: Normocephalic.  Eyes: Pupils are equal, round, and reactive to light.  Neck: Normal range of motion.  Cardiovascular: Normal rate.   Respiratory: Effort normal. He has no wheezes.  GI: Soft. Bowel sounds are normal. He exhibits no distension. There is no tenderness.  Genitourinary:  Genitourinary Comments: Foley catheter in place  Musculoskeletal:  Right quads hamstrings are well healed lumbar incision is well-healed. He has the trace pulses palpable. No venous stasis changes no pitting edema. Sciatic notch tenderness is present some trochanteric bursal tenderness.  Neurological: Coordination normal.  Skin: Skin is warm and dry. He is not diaphoretic.  Psychiatric: He has a normal mood and affect. His behavior is normal. Judgment and thought content normal.    Assessment/Plan: Patient has multilevel mild to moderate narrowing at multiple levels in the lumbar spine. Foraminal narrowing is seen so levels are moderate to severe. There is no significant change from previous MRI. He does not have any areas of severe central stenosis. His pain is worse when he stands more than 60 seconds or tries to walk on extended distance. Some of this may be related to peripheral arterial disease. I recommend continue physical therapy, skilled facility short-term. He can follow-up in the office in 2 weeks. We might consider a repeat epidural. The present time no surgery is indicated. Thank the opportunity to share in his care  Marybelle Killings 12/04/2016, 11:01 AM

## 2016-12-04 NOTE — Progress Notes (Signed)
Attempted twice to call for report at [facility], no answer. Will try again in 41mins.

## 2016-12-04 NOTE — Progress Notes (Signed)
Physical Therapy Treatment Patient Details Name: Dylan Knight MRN: [id number] DOB: 25-Jun-1924 Today's Date: 12/04/2016    History of Present Illness  Dylan Knight is a 83 y.o. male with history of CAD, peripheral vascular disease, hypertension, COPD and hypothyroidism presents to the ER because of worsening pain in the low back radiating to right lower extremity.   MRI shows moderate to severe stenosis in all lumbar levels    PT Comments    Pt steadily improving, but still needs cuing and practicing of mobility and back education  Follow Up Recommendations  SNF     Equipment Recommendations  None recommended by PT    Recommendations for Other Services       Precautions / Restrictions Precautions Precautions: Fall Required Braces or Orthoses: Spinal Brace Spinal Brace: Lumbar corset    Mobility  Bed Mobility                  Transfers Overall transfer level: Needs assistance   Transfers: Sit to/from Stand Sit to Stand: Min guard;Min assist         General transfer comment: cues for hand placement  Ambulation/Gait Ambulation/Gait assistance: Min assist Ambulation Distance (Feet): 150 Feet Assistive device: Rolling walker (2 wheeled) Gait Pattern/deviations: Step-through pattern Gait velocity: slower   General Gait Details: steps still guarded,  generally steady   Science writer    Modified Rankin (Stroke Patients Only)       Balance Overall balance assessment: Needs assistance   Sitting balance-Leahy Scale: Good     Standing balance support: Bilateral upper extremity supported Standing balance-Leahy Scale: Fair Standing balance comment: prefers/reliant on RW, but statically can balance without assist                    Cognition Arousal/Alertness: Awake/alert Behavior During Therapy: WFL for tasks assessed/performed Overall Cognitive Status: Within Functional Limits for tasks assessed                       Exercises      General Comments General comments (skin integrity, edema, etc.): practiced donning/doffing the brace and reinforced back care/precautions.      Pertinent Vitals/Pain Pain Assessment: Faces Pain Score: 0-No pain    Home Living                      Prior Function            PT Goals (current goals can now be found in the care plan section) Acute Rehab PT Goals Patient Stated Goal: ultimately get back home PT Goal Formulation: With patient Time For Goal Achievement: 12/10/16 Potential to Achieve Goals: Good Progress towards PT goals: Progressing toward goals    Frequency    Min 3X/week      PT Plan Current plan remains appropriate    Co-evaluation             End of Session   Activity Tolerance: Patient tolerated treatment well Patient left: in chair;with call bell/phone within reach;with chair alarm set;with family/visitor present     Time: 1204-1226 PT Time Calculation (min) (ACUTE ONLY): 22 min  Charges:  $Therapeutic Activity: 8-22 mins                    G CodesTessie Fass Carrell Rahmani 12/04/2016, 1:22 PM 12/04/2016  Donnella Sham, PT  [phone]  (pager)

## 2016-12-04 NOTE — Progress Notes (Addendum)
Attempted to give report again, no answer.  Gave ph# to Mikki Santee (reception) to call for report whenever they are ready. Also informed that patient already left the hospital.

## 2016-12-04 NOTE — Telephone Encounter (Signed)
Please call patient and work him in on 12/15/16 for follow up low back with Dr. Lorin Mercy. Thanks.

## 2016-12-08 ENCOUNTER — Non-Acute Institutional Stay (SKILLED_NURSING_FACILITY): Payer: Medicare Other | Admitting: Internal Medicine

## 2016-12-08 ENCOUNTER — Encounter: Payer: Self-pay | Admitting: Internal Medicine

## 2016-12-08 DIAGNOSIS — R2681 Unsteadiness on feet: Secondary | ICD-10-CM

## 2016-12-08 DIAGNOSIS — L03114 Cellulitis of left upper limb: Secondary | ICD-10-CM

## 2016-12-08 DIAGNOSIS — E118 Type 2 diabetes mellitus with unspecified complications: Secondary | ICD-10-CM | POA: Diagnosis not present

## 2016-12-08 DIAGNOSIS — J42 Unspecified chronic bronchitis: Secondary | ICD-10-CM

## 2016-12-08 DIAGNOSIS — R3911 Hesitancy of micturition: Secondary | ICD-10-CM | POA: Diagnosis not present

## 2016-12-08 DIAGNOSIS — I739 Peripheral vascular disease, unspecified: Secondary | ICD-10-CM | POA: Diagnosis not present

## 2016-12-08 DIAGNOSIS — I10 Essential (primary) hypertension: Secondary | ICD-10-CM | POA: Diagnosis not present

## 2016-12-08 DIAGNOSIS — E039 Hypothyroidism, unspecified: Secondary | ICD-10-CM

## 2016-12-08 DIAGNOSIS — E871 Hypo-osmolality and hyponatremia: Secondary | ICD-10-CM

## 2016-12-08 DIAGNOSIS — M5441 Lumbago with sciatica, right side: Secondary | ICD-10-CM | POA: Diagnosis not present

## 2016-12-08 NOTE — Progress Notes (Signed)
LOCATION: [facility]  PCP: Maximino Greenland, MD   Code Status: DNR  Goals of care: Advanced Directive information Advanced Directives 12/08/2016  Does Patient Have a Medical Advance Directive? Yes  Type of Advance Directive Out of facility DNR (pink MOST or yellow form)  Does patient want to make changes to medical advance directive? No - Patient declined  Copy of [place] in Chart? -       Extended Emergency Contact Information Primary Emergency Contact: Tomaselli,Nancy Address: [address] [place], [place] [address] [place] Phone: [phone] Mobile Phone: [phone] Relation: Spouse   Allergies  Allergen Reactions  . Iohexol Hives    Went away with benadryl   . Iodine Rash    Chief Complaint  Patient presents with  . New Admit To SNF    New Admission Visit      HPI:  [name] seen today for short term rehabilitation post hospital admission from 11/30/2016-12/04/2016 with acute on chronic back pain radiating down his right lower extremity. He underwent MRI of spine showing numerous bulging disc from T12-S1. He was given IV steroid and pain medication and improvement was noticed. He was then switched to oral steroid and therapy was recommended. He had acute urinary retention and required Foley catheter during this hospitalization.He has medical history of COPD, hypertension, hypothyroidism, peripheral vascular disease among others. He is seen in his room today.  Review of Systems:  Constitutional: Negative for fever, chills, diaphoresis.  HENT: Negative for headache, congestion, nasal discharge, sore throat, difficulty swallowing.   Eyes: Negative for blurred vision, double vision and discharge. Wears glasses.  Respiratory: Negative for shortness of breath and wheezing. Positive for chronic cough.   Cardiovascular: Negative for chest pain, palpitations, leg swelling.  Gastrointestinal: Negative  for heartburn, nausea, vomiting, abdominal pain, loss of appetite. Last bowel movement was 2 days ago. Genitourinary: Negative for dysuria and flank pain. Positive for dribbling and urinary hesitancy.  Musculoskeletal: Negative for back pain, fall in the facility.  Skin: Negative for itching, rash.  Neurological: Negative for dizziness. Psychiatric/Behavioral: Negative for depression   Past Medical History:  Diagnosis Date  . Arthritis    left hip  . BPH (benign prostatic hyperplasia)   . Cancer [facility])    Skin cancers removed  . Carotid artery occlusion   . COPD (chronic obstructive pulmonary disease) ([place])   . Diabetes mellitus 2011   Border line  . Diabetes mellitus without complication ([place])   . Hyperlipidemia   . Hypertension   . Hypothyroidism   . Macular degeneration   . Peripheral arterial disease ([place]) 2002  . Peripheral vascular disease ([place])   . Peripheral vascular disease (HCC)    PAD  . Stroke ([place]) 2005  . Stroke [facility])    2005 - Dr Michael Boston - [place]   Past Surgical History:  Procedure Laterality Date  . ABDOMINAL AORTIC ANEURYSM REPAIR  2003   Aorto-left femoral-right iliac BPG by Dr. Scot Dock  . Aortic aneursym surgery     2003- Dr. Scot Dock  . BACK SURGERY     1960  . CAROTID ENDARTERECTOMY  Jan. 25,2007   Right CEA  . Carotid stenosis surgery on right     2007  . CARPAL TUNNEL RELEASE Left 06/02/2016   Procedure: LEFT CARPAL TUNNEL RELEASE;  Surgeon: Daryll Brod, MD;  Location: [place];  Service: Orthopedics;  Laterality: Left;  . EYE SURGERY     Catarct surgery and lens implant - bilateral  . PR VEIN BYPASS GRAFT,AORTO-FEM-POP  2005   Revision Right Fem-pop  . PR VEIN BYPASS GRAFT,AORTO-FEM-POP  2002   Right Fem-pop  . RADIOLOGY WITH ANESTHESIA N/A 12/02/2016   Procedure: MRI LUMBAR SPINE WITHOUT;  Surgeon: Medication Radiologist, MD;  Location: [place];  Service: Radiology;  Laterality: N/A;  . Right leg bypass     2002 -   and amputation of right fifth toe  . SPINE SURGERY  1960   lumbar spine  . [place], 2007   cervical spine  . TOE AMPUTATION  08-2001   right 5th toe amputation   Social History:   reports that he quit smoking about 12 years ago. His smoking use included Cigarettes. He has a 63.00 pack-year smoking history. He has quit using smokeless tobacco. He reports that he does not drink alcohol or use drugs.  Family History  Problem Relation Age of Onset  . Stroke Mother   . Hypertension Mother   . Heart disease Father   . Heart attack Father   . Cancer Brother   . Hypertension Brother     Medications: Allergies as of 12/08/2016      Reactions   Iohexol Hives   Went away with benadryl   Iodine Rash      Medication List       Accurate as of 12/08/16  2:55 PM. Always use your most recent med list.          acetaminophen 325 MG tablet Commonly known as:  TYLENOL Take 650 mg by mouth every 6 (six) hours as needed for mild pain.   albuterol 108 (90 Base) MCG/ACT inhaler Commonly known as:  PROVENTIL HFA;VENTOLIN HFA Inhale 2 puffs into the lungs every 6 (six) hours as needed for wheezing or shortness of breath.   atenolol-chlorthalidone 50-25 MG tablet Commonly known as:  TENORETIC Take 0.5 tablets by mouth daily.   clopidogrel 75 MG tablet Commonly known as:  PLAVIX Take 75 mg by mouth daily.   ezetimibe-simvastatin 10-40 MG tablet Commonly known as:  VYTORIN Take 1 tablet by mouth See admin instructions. Take Monday, Wednesday, and Friday in evening.   guaiFENesin 600 MG 12 hr tablet Commonly known as:  MUCINEX Take 600 mg by mouth every evening.   HYDROcodone-acetaminophen 5-325 MG tablet Commonly known as:  NORCO/VICODIN Take 1 tablet by mouth every 4 (four) hours as needed for moderate pain.   levothyroxine 25 MCG tablet Commonly known as:  SYNTHROID, LEVOTHROID 1.5-2 tablets every morning. Take 1.5 tablet Monday-Friday, and 2 tabs on Saturday and  Sunday.   loratadine 10 MG tablet Commonly known as:  CLARITIN Take 10 mg by mouth daily as needed for allergies. Reported on 01/03/2016   OCUVITE PRESERVISION PO Take 1 tablet by mouth 2 (two) times daily.   CENTRUM SILVER PO Take 1 tablet by mouth daily.   predniSONE 10 MG tablet Commonly known as:  DELTASONE Take 4 tablets (40 mg) daily for 2 days, then, Take 3 tablets (30 mg) daily for 2 days, then, Take 2 tablets (20 mg) daily for 2 days, then, Take 1 tablets (10 mg) daily for 1 days, then stop   sitaGLIPtin-metformin 50-500 MG tablet Commonly known as:  JANUMET Take 1 tablet by mouth 2 (two) times daily with a meal.       Immunizations: Immunization History  Administered Date(s)  Administered  . Influenza Split 07/23/2012, 08/12/2013, 07/10/2014, 06/28/2015  . Influenza,inj,Quad PF,36+ Mos 07/08/2016  . PPD Test 12/04/2016  . Pneumococcal Conjugate-13 08/28/2014  . Pneumococcal Polysaccharide-23 11/09/2009  . Td 01/29/2009  . Zoster 01/06/2012     Physical Exam: Vitals:   12/08/16 1446  BP: 94/65  Pulse: 76  Resp: 16  Temp: 98.1 F (36.7 C)  TempSrc: Oral  SpO2: 97%  Weight: 165 lb 1.6 oz (74.9 kg)  Height: 5\' 6"  (1.676 m)   Body mass index is 26.65 kg/m.  General- elderly male, well built, in no acute distress Head- normocephalic, atraumatic Nose- no nasal discharge Throat- moist mucus membrane Eyes- PERRLA, EOMI, no pallor, no icterus, no discharge, normal conjunctiva, normal sclera Neck- no cervical lymphadenopathy Cardiovascular- normal s1,s2, no murmur Respiratory- bilateral clear to auscultation, no wheeze, no rhonchi, no crackles, no use of accessory muscles Abdomen- bowel sounds present, soft, non tender, no guarding or rigidity, no CVA tenderness Musculoskeletal- able to move all 4 extremities, no spinal and paraspinal tenderness, back brace present, trace ankle edema Neurological- alert and oriented to person, place and time Skin- warm and  dry, has an open skin area between the web space of fourth and fifth finger, erythema around the area and tender to touch Psychiatry- normal mood and affect    Labs reviewed: Basic Metabolic Panel:  Recent Labs  11/30/16 2153 12/01/16 0446 12/02/16 0031  NA 128* 128* 130*  K 4.3 4.0 3.7  CL 93* 93* 96*  CO2 22 22 24   GLUCOSE 134* 229* 136*  BUN 14 15 15   CREATININE 1.08 1.32* 1.11  CALCIUM 10.4* 9.8 9.3   Liver Function Tests:  Recent Labs  11/02/16 1021  AST 40  ALT 18  ALKPHOS 46  BILITOT 1.4*  PROT 7.0  ALBUMIN 4.4   No results for input(s): LIPASE, AMYLASE in the last 8760 hours. No results for input(s): AMMONIA in the last 8760 hours. CBC:  Recent Labs  11/02/16 1021  11/30/16 2153 12/01/16 0446 12/02/16 0031  WBC 8.9  < > 16.6* 14.9* 8.4  NEUTROABS 7.1*  --  14.2*  --   --   HGB 16.3  < > 16.2 15.5 14.0  HCT 46.4  < > 45.5 43.2 39.9  MCV 98.3  < > 96.6 96.6 97.3  PLT 209  < > 216 210 169  < > = values in this interval not displayed. Cardiac Enzymes:  Recent Labs  12/01/16 0934 12/01/16 1804 12/02/16 0031  TROPONINI <0.03 <0.03 <0.03   BNP: Invalid input(s): POCBNP CBG:  Recent Labs  12/03/16 2133 12/04/16 0755 12/04/16 1233  GLUCAP 127* 118* 130*    Radiological Exams: Dg Pelvis 1-2 Views  Result Date: 12/01/2016 CLINICAL DATA:  Pain right hip.  No injury. EXAM: PELVIS - 1-2 VIEW COMPARISON:  11/02/2016.  CT 07/03/2014. FINDINGS: Degenerative changes lumbar spine and both hips. No acute bony or joint abnormality identified. Aortoiliac and femoral atherosclerotic vascular calcification. Postsurgical change noted left pelvis. IMPRESSION: 1. Degenerative changes lumbar spine and both hips. Diffuse osteopenia. No acute abnormality. 2.  Aortoiliac and peripheral vascular disease. Electronically Signed   By: Marcello Moores  Register   On: 12/01/2016 16:37   Mr Lumbar Spine Wo Contrast  Result Date: 12/02/2016 CLINICAL DATA:  Patient is a 83 y.o.  male with h/o CAD, PVD, HTN, COPD and hypothyroidism. Pt. has had multiple visits since 10/29/16 for severe back pain radiating down to right lower extremity. Came to ED on 11/30/16 for  this acute on chronic pain. EXAM: MRI LUMBAR SPINE WITHOUT CONTRAST TECHNIQUE: Multiplanar, multisequence MR imaging of the lumbar spine was performed. No intravenous contrast was administered. COMPARISON:  MR lumbar spine 12/27/2013 FINDINGS: Segmentation:  Standard. Alignment:  Physiologic. Vertebrae:  No fracture, evidence of discitis, or bone lesion. Conus medullaris: Extends to the L1 level and appears normal. Paraspinal and other soft tissues: No paraspinal abnormality. Small stable left renal cyst. Disc levels: Disc spaces: Degenerative disc disease with disc height loss at L1-2, L3-4, and L4-5 with disc height loss. Prior laminectomy at L3-4. T12-L1: Mild broad-based disc bulge eccentric towards the right with the right lateral component abutting the right extraforaminal T12 nerve root. Mild spinal stenosis. Mild bilateral foraminal stenosis. L1-L2: Broad-based disc bulge with a small broad left paracentral disc protrusion. Mild bilateral facet arthropathy. Moderate spinal stenosis. Left lateral recess stenosis. No evidence of neural foraminal stenosis. L2-L3: Broad-based disc bulge. Moderate bilateral facet arthropathy. Mild spinal stenosis. Moderate right and mild left foraminal stenosis. L3-L4: Broad-based disc bulge. Moderate bilateral facet arthropathy. Mild spinal stenosis. Bilateral lateral recess stenosis. Severe bilateral foraminal stenosis. L4-L5: Broad-based disc bulge. Mild bilateral facet arthropathy. Moderate bilateral foraminal stenosis, left worse than right. L5-S1: Broad-based disc bulge with a small right paracentral disc protrusion contacting the right intraspinal S1 nerve root. Mild bilateral foraminal stenosis. No central canal stenosis. IMPRESSION: 1. At T12-L1 there is a mild broad-based disc bulge  eccentric towards the right with the right lateral component abutting the right extraforaminal T12 nerve root. Mild spinal stenosis. Mild bilateral foraminal stenosis. 2. At L1-2 there is a broad-based disc bulge with a small broad left paracentral disc protrusion. Mild bilateral facet arthropathy. Moderate spinal stenosis. Left lateral recess stenosis. 3. At L2-3 there is a broad-based Broad-based disc bulge. Moderate bilateral facet arthropathy. Mild spinal stenosis. Moderate right and mild left foraminal stenosis. 4. At L3-4 there is a broad-based disc bulge. Moderate bilateral facet arthropathy. Mild spinal stenosis. Bilateral lateral recess stenosis. Severe bilateral foraminal stenosis. 5. At L4-5 there is a broad-based disc bulge. Mild bilateral facet arthropathy. Moderate bilateral foraminal stenosis, left worse than right. 6. At L5-S1 there is a broad-based disc bulge with a small right paracentral disc protrusion contacting the right intraspinal S1 nerve root. Mild bilateral foraminal stenosis. 7. No significant interval change compared with the prior exam. Electronically Signed   By: Kathreen Devoid   On: 12/02/2016 16:15   US Renal  Result Date: 12/01/2016 CLINICAL DATA:  Acute renal failure, diabetes, peripheral vascular disease, previous CVA EXAM: RENAL / URINARY TRACT ULTRASOUND COMPLETE COMPARISON:  Abdominal and pelvic CT scan of July 03, 2014 FINDINGS: Right Kidney: Length: 11.6 cm. The renal cortical echotexture is approximately equal to or slightly greater than that of the adjacent liver. There is no hydronephrosis. Left Kidney: Length: 10.7 cm. The renal cortical echotexture is mildly increased similar to that on the right. There is an upper pole cyst measuring 1.9 x 1.6 x 1.6 cm. There is no hydronephrosis. Bladder: The urinary bladder is decompressed by a Foley catheter. IMPRESSION: Mildly increased renal cortical echotexture consistent with medical renal disease. There is no hydronephrosis.  The urinary bladder is decompressed by a Foley catheter. Electronically Signed   By: David  Martinique M.D.   On: 12/01/2016 11:09   Ct Hip Right Wo Contrast  Result Date: 12/03/2016 CLINICAL DATA:  Chronic right hip pain.  No known injury. EXAM: CT OF THE RIGHT HIP WITHOUT CONTRAST TECHNIQUE: Multidetector CT imaging of the  right hip was performed according to the standard protocol. Multiplanar CT image reconstructions were also generated. COMPARISON:  Single-view of the pelvis 12/01/2016. CT abdomen and pelvis 07/03/2014. FINDINGS: Bones/Joint/Cartilage The right hip is located. No fracture is identified. Chondrocalcinosis about the joint is noted. Joint space is preserved. There is no avascular necrosis of the femoral head. No lytic or sclerotic bony lesion is identified. Right sacroiliac joint appears normal. Ligaments Suboptimally assessed by CT. Muscles and Tendons Intact. Soft tissues Foley catheter is in place in a decompressed urinary bladder. Atherosclerotic vascular disease is seen. IMPRESSION: Negative for fracture or other acute abnormality. Minimal degenerative disease right hip. Chondrocalcinosis also noted. Atherosclerosis. Electronically Signed   By: Inge Rise M.D.   On: 12/03/2016 10:02    Assessment/Plan  Unsteady gait With right lower extremity weakness.Will have patient work with PT/OT as tolerated to regain strength and restore function.  Fall precautions are in place.  Low back pain with sciatica MRI lumbar spine shows multiple disc bulge. Continue and complete slow taper of prednisone on 12/11/2016. Continue hydrocodone-acetaminophen 5-325 milligrams every 4 hours as needed for pain and Tylenol 650 mg every 6 hours as needed for pain. Plan is for him to work with therapy team and follow up with orthopedic.Will have him work with physical therapy and occupational therapy team to help with gait training and muscle strengthening exercises.fall precautions. Skin care. Encourage to  be out of bed. PMR consult. Back precautions and to wear back brace when out of bed.  Cellulitis of hand Start Augmentin 875 mg every 12 hours for 5 days with fluoroscopy*supplement. Monitor for worsening signs of infection.  Hyponatremia Monitor BMP  Urinary hesitancy Has been able to urinate, recently had foley in place, encouraged hydration, if problem persists, consider starting flomax and get urology consult  Type 2 diabetes mellitus Continue Janumet current regimen, monitor CBG Lab Results  Component Value Date   HGBA1C 6.7 (H) 12/01/2016    Hypertension Monitor blood pressure reading. Check BMP. Continue atenolol and chlorthalidone.  COPD Breathing currently stable on when necessary Proventil  Hypothyroidism Continue home regimen levothyroxine, no changes made  Peripheral vascular disease Continue Plavix, s/p stent    Goals of care: short term rehabilitation   Labs/tests ordered: CBC, CMP 12/09/2016  Family/ staff Communication: reviewed care plan with patient and nursing supervisor    Blanchie Serve, MD Internal Medicine [place], [place] [address] Cell Phone (Monday-Friday 8 am - 5 pm): [phone] On Call: [phone] and follow prompts after 5 pm and on weekends Office Phone: [phone] Office Fax: [phone]

## 2016-12-10 ENCOUNTER — Encounter (INDEPENDENT_AMBULATORY_CARE_PROVIDER_SITE_OTHER): Payer: Medicare Other | Admitting: Ophthalmology

## 2016-12-10 DIAGNOSIS — E11311 Type 2 diabetes mellitus with unspecified diabetic retinopathy with macular edema: Secondary | ICD-10-CM

## 2016-12-10 DIAGNOSIS — H35033 Hypertensive retinopathy, bilateral: Secondary | ICD-10-CM | POA: Diagnosis not present

## 2016-12-10 DIAGNOSIS — E113291 Type 2 diabetes mellitus with mild nonproliferative diabetic retinopathy without macular edema, right eye: Secondary | ICD-10-CM

## 2016-12-10 DIAGNOSIS — I10 Essential (primary) hypertension: Secondary | ICD-10-CM

## 2016-12-10 DIAGNOSIS — H353221 Exudative age-related macular degeneration, left eye, with active choroidal neovascularization: Secondary | ICD-10-CM | POA: Diagnosis not present

## 2016-12-10 DIAGNOSIS — E113212 Type 2 diabetes mellitus with mild nonproliferative diabetic retinopathy with macular edema, left eye: Secondary | ICD-10-CM | POA: Diagnosis not present

## 2016-12-10 DIAGNOSIS — H348112 Central retinal vein occlusion, right eye, stable: Secondary | ICD-10-CM

## 2016-12-15 ENCOUNTER — Encounter (INDEPENDENT_AMBULATORY_CARE_PROVIDER_SITE_OTHER): Payer: Self-pay | Admitting: Orthopaedic Surgery

## 2016-12-15 ENCOUNTER — Ambulatory Visit (INDEPENDENT_AMBULATORY_CARE_PROVIDER_SITE_OTHER): Payer: Medicare Other | Admitting: Orthopaedic Surgery

## 2016-12-15 VITALS — BP 120/66 | HR 79 | Ht 66.0 in | Wt 165.0 lb

## 2016-12-15 DIAGNOSIS — M47816 Spondylosis without myelopathy or radiculopathy, lumbar region: Secondary | ICD-10-CM

## 2016-12-15 NOTE — Progress Notes (Signed)
Office Visit Note   Patient: Dylan Knight           Date of Birth: [birth year]           MRN: [id number] Visit Date: 12/15/2016              Requested by: Glendale Chard, MD [address] [place], [place] [address] PCP: Maximino Greenland, MD   Assessment & Plan: Visit Diagnoses:  1. Spondylosis without myelopathy or radiculopathy, lumbar region     Plan: Patient has multilevel lumbar spondylosis with mild to moderate narrowing centrally lateral recess and foraminal  multiple levels in the lumbar spine. No single area of severe compression that corresponds with the symptoms. Continue therapy and ambulation and gradual strengthening and recheck in one month  Follow-Up Instructions: Return in about 1 month (around 01/12/2017).   Orders:  No orders of the defined types were placed in this encounter.  No orders of the defined types were placed in this encounter.     Procedures: No procedures performed   Clinical Data: No additional findings.   Subjective: Chief Complaint  Patient presents with  . Left Wrist - Pain  . Lower Back - Pain    Patient returns for follow up low back pain. He has been admitted to the hospital since last visit due to pain. He had a MRI L-spine and CT Right hip. He is currently staying at [facility]. He states the physical therapy is helping him some. He describes that the pain is still intense, it is just not as often. He is taking hydrocodone for pain. He is wearing a lumbar brace which he was told to wear whenever he is up. He does not wear it to sleep. He thinks the brace is a little heavy and questions whether it bothers him. He is ambulating with a rolling walker today.    Review of Systems 14 point review of systems updated he denies fever chills no rash. No change from his consultation the hospital 2 weeks ago.   Objective: Vital Signs: BP 120/66   Pulse 79   Ht 5\' 6"  (1.676 m)   Wt 165 lb (74.8 kg)   BMI 26.63  kg/m   Physical Exam  Constitutional: He is oriented to person, place, and time. He appears well-developed and well-nourished.  HENT:  Head: Normocephalic and atraumatic.  Eyes: EOM are normal. Pupils are equal, round, and reactive to light.  Neck: No tracheal deviation present. No thyromegaly present.  Cardiovascular: Normal rate.   Pulmonary/Chest: Effort normal. He has no wheezes.  Abdominal: Soft. Bowel sounds are normal.  Musculoskeletal:  Patient has 20 internal rotation right hip as well as left hip without significant pain. Negative straight leg raising anterior tib EHL quads are strong. He is using his brace for ambulation. He is making progress with physical therapy.  Neurological: He is alert and oriented to person, place, and time.  Skin: Skin is warm and dry. Capillary refill takes less than 2 seconds.  Psychiatric: He has a normal mood and affect. His behavior is normal. Judgment and thought content normal.    Ortho Exam  Specialty Comments:  No specialty comments available.  Imaging: No results found.  CT scan hip showed some chondrocalcinosis about the hip but the joint space is well-preserved no evidence of AVN.  Lumbar images showed multilevel spondylosis with mild to moderate narrowing. No areas of severe narrowing in his changes extend from T11 down to  L5-S1 level. There was not significant change in comparison to his previous MRI. PMFS History: Patient Active Problem List   Diagnosis Date Noted  . Low back pain with right-sided sciatica 12/01/2016  . Diabetes mellitus type 2, controlled ([place]) 12/01/2016  . CAD (coronary artery disease) 12/01/2016  . Hypothyroidism 12/01/2016  . Low back pain 12/01/2016  . COPD exacerbation ([place]) 11/02/2016  . Back pain 11/02/2016  . Aftercare following surgery of the circulatory system, [place] 06/28/2013  . Cough 04/18/2013  . COPD (chronic obstructive pulmonary disease) ([place]) 11/22/2012  . Occlusion and stenosis of  carotid artery without mention of cerebral infarction 06/29/2012  . Peripheral vascular disease ([place]) 12/30/2011   Past Medical History:  Diagnosis Date  . Arthritis    left hip  . BPH (benign prostatic hyperplasia)   . Cancer [facility])    Skin cancers removed  . Carotid artery occlusion   . COPD (chronic obstructive pulmonary disease) ([place])   . Diabetes mellitus 2011   Border line  . Diabetes mellitus without complication ([place])   . Hyperlipidemia   . Hypertension   . Hypothyroidism   . Macular degeneration   . Peripheral arterial disease ([place]) 2002  . Peripheral vascular disease ([place])   . Peripheral vascular disease ([facility])    PAD  . Stroke ([place]) 2005  . Stroke [facility])    2005 - Dr Michael Boston - [place]    Family History  Problem Relation Age of Onset  . Stroke Mother   . Hypertension Mother   . Heart disease Father   . Heart attack Father   . Cancer Brother   . Hypertension Brother     Past Surgical History:  Procedure Laterality Date  . ABDOMINAL AORTIC ANEURYSM REPAIR  2003   Aorto-left femoral-right iliac BPG by Dr. Scot Dock  . Aortic aneursym surgery     2003- Dr. Scot Dock  . BACK SURGERY     1960  . CAROTID ENDARTERECTOMY  Jan. 25,2007   Right CEA  . Carotid stenosis surgery on right     2007  . CARPAL TUNNEL RELEASE Left 06/02/2016   Procedure: LEFT CARPAL TUNNEL RELEASE;  Surgeon: Daryll Brod, MD;  Location: [place];  Service: Orthopedics;  Laterality: Left;  . EYE SURGERY     Catarct surgery and lens implant - bilateral  . PR VEIN BYPASS GRAFT,AORTO-FEM-POP  2005   Revision Right Fem-pop  . PR VEIN BYPASS GRAFT,AORTO-FEM-POP  2002   Right Fem-pop  . RADIOLOGY WITH ANESTHESIA N/A 12/02/2016   Procedure: MRI LUMBAR SPINE WITHOUT;  Surgeon: Medication Radiologist, MD;  Location: [place];  Service: Radiology;  Laterality: N/A;  . Right leg bypass     2002 -  and amputation of right fifth toe  . SPINE SURGERY  1960   lumbar spine  . [place], 2007   cervical spine  . TOE AMPUTATION  08-2001   right 5th toe amputation   Social History   Occupational History  . Not on file.   Social History Main Topics  . Smoking status: Former Smoker    Packs/day: 1.00    Years: 63.00    Types: Cigarettes    Quit date: 04/09/2004  . Smokeless tobacco: Former Systems developer     Comment: Quit smoking in 2005  . Alcohol use No  . Drug use: No  . Sexual activity: Not on file

## 2016-12-18 ENCOUNTER — Non-Acute Institutional Stay (SKILLED_NURSING_FACILITY): Payer: Medicare Other | Admitting: Family

## 2016-12-18 DIAGNOSIS — R2681 Unsteadiness on feet: Secondary | ICD-10-CM | POA: Diagnosis not present

## 2016-12-18 DIAGNOSIS — I739 Peripheral vascular disease, unspecified: Secondary | ICD-10-CM | POA: Diagnosis not present

## 2016-12-18 DIAGNOSIS — I2510 Atherosclerotic heart disease of native coronary artery without angina pectoris: Secondary | ICD-10-CM | POA: Diagnosis not present

## 2016-12-18 DIAGNOSIS — E1121 Type 2 diabetes mellitus with diabetic nephropathy: Secondary | ICD-10-CM | POA: Diagnosis not present

## 2016-12-18 DIAGNOSIS — E039 Hypothyroidism, unspecified: Secondary | ICD-10-CM | POA: Diagnosis not present

## 2016-12-18 DIAGNOSIS — J449 Chronic obstructive pulmonary disease, unspecified: Secondary | ICD-10-CM | POA: Diagnosis not present

## 2016-12-18 NOTE — Progress Notes (Signed)
Location:  [facility] Room Number: [place] of Service:  SNF [phone])  Provider: Marlowe Sax FNP-C   PCP: Maximino Greenland, MD Patient Care Team: Glendale Chard, MD as PCP - General (Internal Medicine) Marybelle Killings, MD as Consulting Physician (Orthopedic Surgery) Magnus Sinning, MD as Consulting Physician (Physical Medicine and Rehabilitation) Thayer Headings, MD as Consulting Physician (Cardiology) Hayden Pedro, MD as Consulting Physician (Ophthalmology) Collene Gobble, MD as Consulting Physician (Pulmonary Disease)  Extended Emergency Contact Information Primary Emergency Contact: Bouley,Nancy Address: [address] [place], [place] [address] Johnnette Litter of [place] Phone: [phone] Mobile Phone: [phone] Relation: Spouse  Code Status:DNR  Goals of care:  Advanced Directive information Advanced Directives 12/08/2016  Does Patient Have a Medical Advance Directive? Yes  Type of Advance Directive Out of facility DNR (pink MOST or yellow form)  Does patient want to make changes to medical advance directive? No - Patient declined  Copy of [place] in Chart? -     Allergies  Allergen Reactions  . Iohexol Hives    Went away with benadryl   . Iodine Rash    Chief Complaint  Patient presents with  . Discharge Note    discharge home     HPI:  83 y.o. male seen today at [facility] for discharge home. He was here for short term rehabilitation post hospital admission from 11/30/2016-12/04/2016 with acute on chronic back pain radiating down his right lower extremity. He underwent MRI of spine showing numerous bulging disc from T12-S1. He was given IV steroid and pain medication with much  Improvement.He was then switched to oral steroid and therapy was recommended. He had acute urinary retention and required Foley catheter during this hospitalization. He was discharged to rehab for therapy. He  has a medical history of HTN, Hyperlipidemia, COPD,type 2 DM, Hypothyroidism, PVD, CAD,Stroke, BPH among other conditions. He is seen in his room today. He states still has lower back pain but much improvement compared to hospitalization. He recently had follow up appointment with [employer].He has worked well with PT/OT now stable for discharge home.He will be discharged home with Home health PT/OT to continue with ROM, Exercise, Gait stability and muscle strengthening. He does not   require any DME.Home health services will be arranged by facility social worker prior to discharge. Prescription medication will be written x 1 month then patient to follow up with PCP in 1-2 weeks. Facility staff report no new concerns.     Past Medical History:  Diagnosis Date  . Arthritis    left hip  . BPH (benign prostatic hyperplasia)   . Cancer [facility])    Skin cancers removed  . Carotid artery occlusion   . COPD (chronic obstructive pulmonary disease) ([place])   . Diabetes mellitus 2011   Border line  . Diabetes mellitus without complication ([place])   . Hyperlipidemia   . Hypertension   . Hypothyroidism   . Macular degeneration   . Peripheral arterial disease ([place]) 2002  . Peripheral vascular disease ([place])   . Peripheral vascular disease (HCC)    PAD  . Stroke ([place]) 2005  . Stroke [facility])    2005 - Dr Michael Boston - [place]    Past Surgical History:  Procedure Laterality Date  . ABDOMINAL AORTIC ANEURYSM REPAIR  2003   Aorto-left femoral-right iliac BPG by Dr. Scot Dock  .  Aortic aneursym surgery     2003- Dr. Scot Dock  . BACK SURGERY     1960  . CAROTID ENDARTERECTOMY  Jan. 25,2007   Right CEA  . Carotid stenosis surgery on right     2007  . CARPAL TUNNEL RELEASE Left 06/02/2016   Procedure: LEFT CARPAL TUNNEL RELEASE;  Surgeon: Daryll Brod, MD;  Location: [facility];  Service: Orthopedics;  Laterality: Left;  . EYE SURGERY     Catarct surgery and lens implant -  bilateral  . PR VEIN BYPASS GRAFT,AORTO-FEM-POP  2005   Revision Right Fem-pop  . PR VEIN BYPASS GRAFT,AORTO-FEM-POP  2002   Right Fem-pop  . RADIOLOGY WITH ANESTHESIA N/A 12/02/2016   Procedure: MRI LUMBAR SPINE WITHOUT;  Surgeon: Medication Radiologist, MD;  Location: [facility];  Service: Radiology;  Laterality: N/A;  . Right leg bypass     2002 -  and amputation of right fifth toe  . SPINE SURGERY  1960   lumbar spine  . [place], 2007   cervical spine  . TOE AMPUTATION  08-2001   right 5th toe amputation      reports that he quit smoking about 12 years ago. His smoking use included Cigarettes. He has a 63.00 pack-year smoking history. He has quit using smokeless tobacco. He reports that he does not drink alcohol or use drugs. Social History   Social History  . Marital status: Married    Spouse name: N/A  . Number of children: N/A  . Years of education: N/A   Occupational History  . Not on file.   Social History Main Topics  . Smoking status: Former Smoker    Packs/day: 1.00    Years: 63.00    Types: Cigarettes    Quit date: 04/09/2004  . Smokeless tobacco: Former Systems developer     Comment: Quit smoking in 2005  . Alcohol use No  . Drug use: No  . Sexual activity: Not on file   Other Topics Concern  . Not on file   Social History Narrative   ** Merged History Encounter **        Allergies  Allergen Reactions  . Iohexol Hives    Went away with benadryl   . Iodine Rash    Pertinent  Health Maintenance Due  Topic Date Due  . URINE MICROALBUMIN  02/08/2015  . OPHTHALMOLOGY EXAM  07/21/2015  . FOOT EXAM  09/12/2015  . HEMOGLOBIN A1C  05/31/2017  . INFLUENZA VACCINE  Completed  . PNA vac Low Risk Adult  Completed    Medications: Allergies as of 12/18/2016      Reactions   Iohexol Hives   Went away with benadryl   Iodine Rash      Medication List       Accurate as of 12/18/16  5:46 PM. Always use your most recent med list.          acetaminophen  325 MG tablet Commonly known as:  TYLENOL Take 650 mg by mouth every 6 (six) hours as needed for mild pain.   albuterol 108 (90 Base) MCG/ACT inhaler Commonly known as:  PROVENTIL HFA;VENTOLIN HFA Inhale 2 puffs into the lungs every 6 (six) hours as needed for wheezing or shortness of breath.   atenolol-chlorthalidone 50-25 MG tablet Commonly known as:  TENORETIC Take 0.5 tablets by mouth daily.   clopidogrel 75 MG tablet Commonly known as:  PLAVIX Take 75 mg by mouth daily.   ezetimibe-simvastatin 10-40 MG  tablet Commonly known as:  VYTORIN Take 1 tablet by mouth See admin instructions. Take Monday, Wednesday, and Friday in evening.   guaiFENesin 600 MG 12 hr tablet Commonly known as:  MUCINEX Take 600 mg by mouth every evening.   HYDROcodone-acetaminophen 5-325 MG tablet Commonly known as:  NORCO/VICODIN Take 1 tablet by mouth every 4 (four) hours as needed for moderate pain.   levothyroxine 25 MCG tablet Commonly known as:  SYNTHROID, LEVOTHROID 1.5-2 tablets every morning. Take 1.5 tablet Monday-Friday, and 2 tabs on Saturday and Sunday.   loratadine 10 MG tablet Commonly known as:  CLARITIN Take 10 mg by mouth daily as needed for allergies. Reported on 01/03/2016   OCUVITE PRESERVISION PO Take 1 tablet by mouth 2 (two) times daily.   CENTRUM SILVER PO Take 1 tablet by mouth daily.   sitaGLIPtin-metformin 50-500 MG tablet Commonly known as:  JANUMET Take 1 tablet by mouth 2 (two) times daily with a meal.       Review of Systems  Constitutional: Negative for activity change, appetite change, chills, fatigue and fever.  HENT: Negative for congestion, rhinorrhea, sinus pain, sinus pressure, sneezing and sore throat.   Eyes: Negative.   Respiratory: Negative for cough, chest tightness, shortness of breath and wheezing.   Cardiovascular: Negative for chest pain, palpitations and leg swelling.  Gastrointestinal: Negative for abdominal distention, abdominal pain,  constipation, diarrhea, nausea and vomiting.  Endocrine: Negative.   Genitourinary: Negative for dysuria, flank pain, frequency and urgency.  Musculoskeletal: Positive for back pain and gait problem.  Skin: Negative for color change, pallor and rash.  Neurological: Negative for dizziness, seizures, light-headedness and headaches.  Hematological: Does not bruise/bleed easily.  Psychiatric/Behavioral: Negative for agitation, confusion and sleep disturbance. The patient is not nervous/anxious.     Vitals:   12/18/16 1710  BP: 138/74  Pulse: 71  Resp: 20  Temp: 97.3 F (36.3 C)  SpO2: 97%  Weight: 153 lb (69.4 kg)  Height: 5\' 6"  (1.676 m)   Body mass index is 24.69 kg/m. Physical Exam  Constitutional: He is oriented to person, place, and time. He appears well-developed and well-nourished. No distress.  HENT:  Head: Normocephalic.  Mouth/Throat: Oropharynx is clear and moist. No oropharyngeal exudate.  Eyes: Conjunctivae and EOM are normal. Pupils are equal, round, and reactive to light. Right eye exhibits no discharge. Left eye exhibits no discharge. No scleral icterus.  Neck: Normal range of motion. No JVD present. No thyromegaly present.  Cardiovascular: Normal rate, regular rhythm, normal heart sounds and intact distal pulses.  Exam reveals no gallop and no friction rub.   No murmur heard. Pulmonary/Chest: Effort normal and breath sounds normal. No respiratory distress. He has no wheezes. He has no rales.  Abdominal: Soft. Bowel sounds are normal. He exhibits no distension. There is no tenderness. There is no rebound and no guarding.  Musculoskeletal: He exhibits no edema, tenderness or deformity.  Moves x 4 extremities. Unsteady gait.   Lymphadenopathy:    He has no cervical adenopathy.  Neurological: He is oriented to person, place, and time.  Skin: Skin is warm and dry. No rash noted. No erythema. No pallor.  Psychiatric: He has a normal mood and affect.    Labs  reviewed: Basic Metabolic Panel:  Recent Labs  11/30/16 2153 12/01/16 0446 12/02/16 0031  NA 128* 128* 130*  K 4.3 4.0 3.7  CL 93* 93* 96*  CO2 22 22 24   GLUCOSE 134* 229* 136*  BUN 14 15 15  CREATININE 1.08 1.32* 1.11  CALCIUM 10.4* 9.8 9.3   Liver Function Tests:  Recent Labs  11/02/16 1021  AST 40  ALT 18  ALKPHOS 46  BILITOT 1.4*  PROT 7.0  ALBUMIN 4.4   CBC:  Recent Labs  11/02/16 1021  11/30/16 2153 12/01/16 0446 12/02/16 0031  WBC 8.9  < > 16.6* 14.9* 8.4  NEUTROABS 7.1*  --  14.2*  --   --   HGB 16.3  < > 16.2 15.5 14.0  HCT 46.4  < > 45.5 43.2 39.9  MCV 98.3  < > 96.6 96.6 97.3  PLT 209  < > 216 210 169  < > = values in this interval not displayed. Cardiac Enzymes:  Recent Labs  12/01/16 0934 12/01/16 1804 12/02/16 0031  TROPONINI <0.03 <0.03 <0.03     Recent Labs  12/03/16 2133 12/04/16 0755 12/04/16 1233  GLUCAP 127* 118* 130*   Assessment/Plan:   1. Unsteady gait Has worked well with PT/ OT. Will discharge home PT/OT to continue with ROM, Exercise, Gait stability and muscle strengthening. No DME required. Fall and safety precautions.   2. Coronary artery disease  Chest pain free. Continue on Plavix and Vytorin. Continue to control high risk factors.   3. Chronic obstructive pulmonary disease Stable. Continue on Proventil as needed for wheezing or shortness of breath.   4. Controlled type 2 diabetes mellitus with diabetic nephropathy, without long-term current use of insulin Continue on sitagliptan -Metformin. Hgb A1C with PCP.     5. Hypothyroidism Continue on Levothyroxine. Monitor TSH   6. Peripheral vascular disease No ulcer. Continue on Plavix. Continue to control high risk factors.   Patient is being discharged with the following home health services:   - PT/OT to continue with ROM, Exercise and muscle strengthening.   Patient is being discharged with the following durable medical equipment:    - No DME required    Patient has been advised to f/u with their PCP in 1-2 weeks to for a transitions of care visit.  Social services at their facility was responsible for arranging this appointment.  Pt was provided with adequate prescriptions of noncontrolled medications to reach the scheduled appointment .  For controlled substances, a limited supply was provided as appropriate for the individual patient.  If the pt normally receives these medications from a pain clinic or has a contract with another physician, these medications should be received from that clinic or physician only).    Future labs/tests needed:  CBC, BMP in 1-2 weeks with PCP

## 2017-01-06 ENCOUNTER — Emergency Department (HOSPITAL_COMMUNITY)
Admission: EM | Admit: 2017-01-06 | Discharge: 2017-01-06 | Disposition: A | Discharge status: Home / Self Care | Payer: Medicare Other | Attending: Emergency Medicine | Admitting: Emergency Medicine

## 2017-01-06 ENCOUNTER — Encounter (HOSPITAL_COMMUNITY): Payer: Self-pay | Admitting: Emergency Medicine

## 2017-01-06 DIAGNOSIS — M5441 Lumbago with sciatica, right side: Secondary | ICD-10-CM | POA: Insufficient documentation

## 2017-01-06 DIAGNOSIS — I10 Essential (primary) hypertension: Secondary | ICD-10-CM | POA: Insufficient documentation

## 2017-01-06 DIAGNOSIS — M62838 Other muscle spasm: Secondary | ICD-10-CM | POA: Diagnosis not present

## 2017-01-06 DIAGNOSIS — I2510 Atherosclerotic heart disease of native coronary artery without angina pectoris: Secondary | ICD-10-CM | POA: Diagnosis not present

## 2017-01-06 DIAGNOSIS — G8929 Other chronic pain: Secondary | ICD-10-CM | POA: Diagnosis not present

## 2017-01-06 DIAGNOSIS — Z79899 Other long term (current) drug therapy: Secondary | ICD-10-CM | POA: Insufficient documentation

## 2017-01-06 DIAGNOSIS — E039 Hypothyroidism, unspecified: Secondary | ICD-10-CM | POA: Insufficient documentation

## 2017-01-06 DIAGNOSIS — Z85828 Personal history of other malignant neoplasm of skin: Secondary | ICD-10-CM | POA: Diagnosis not present

## 2017-01-06 DIAGNOSIS — Z7984 Long term (current) use of oral hypoglycemic drugs: Secondary | ICD-10-CM | POA: Diagnosis not present

## 2017-01-06 DIAGNOSIS — E119 Type 2 diabetes mellitus without complications: Secondary | ICD-10-CM | POA: Diagnosis not present

## 2017-01-06 DIAGNOSIS — Z8673 Personal history of transient ischemic attack (TIA), and cerebral infarction without residual deficits: Secondary | ICD-10-CM | POA: Diagnosis not present

## 2017-01-06 DIAGNOSIS — J449 Chronic obstructive pulmonary disease, unspecified: Secondary | ICD-10-CM | POA: Insufficient documentation

## 2017-01-06 DIAGNOSIS — Z7902 Long term (current) use of antithrombotics/antiplatelets: Secondary | ICD-10-CM | POA: Insufficient documentation

## 2017-01-06 DIAGNOSIS — Z87891 Personal history of nicotine dependence: Secondary | ICD-10-CM | POA: Diagnosis not present

## 2017-01-06 DIAGNOSIS — M545 Low back pain: Secondary | ICD-10-CM | POA: Diagnosis present

## 2017-01-06 MED ORDER — METHOCARBAMOL 500 MG PO TABS: 500.0000 mg | ORAL_TABLET | Freq: Two times a day (BID) | ORAL | 0 refills | Status: AC

## 2017-01-06 MED ORDER — HYDROCODONE-ACETAMINOPHEN 5-325 MG PO TABS
1.0000 | ORAL_TABLET | Freq: Once | ORAL | Status: AC
  Administered 2017-01-06: 1 via ORAL
  Filled 2017-01-06: qty 1

## 2017-01-06 MED ORDER — METHOCARBAMOL 500 MG PO TABS
500.0000 mg | ORAL_TABLET | Freq: Once | ORAL | Status: AC
  Administered 2017-01-06: 500 mg via ORAL
  Filled 2017-01-06: qty 1

## 2017-01-06 NOTE — ED Triage Notes (Signed)
To ED via [facility] from home with c/o severe back pain-- hx of same-- takes hydrocodone, helps pain for short time. Was recently in [facility] rehab for back pain also. Pain starts in right thigh then radiates to right hip and low back.  Pt is alert/oriented x 4, lives with wife at home, has home physical therapy .

## 2017-01-06 NOTE — ED Provider Notes (Signed)
[facility] DEPT Provider Note   CSN: [id number] Arrival date & time: 01/06/17  0900     History   Chief Complaint Chief Complaint  Patient presents with  . Back Pain    HPI Dylan Knight is a 83 y.o. male.  The history is provided by the patient.  Back Pain   This is a chronic problem. Episode onset: Several years. The problem occurs constantly. Progression since onset: Fluctuating. Associated with: No significant lumbar stenosis status post prior laminectomies. The pain is present in the sacro-iliac joint and lumbar spine. The quality of the pain is described as aching. The pain radiates to the right thigh. Pain scale: Currently patient's pain mild however reports it being severe last night to the point where he was unable to sleep. The symptoms are aggravated by certain positions. The pain is worse during the night. Pertinent negatives include no chest pain, no fever, no numbness, no bowel incontinence, no perianal numbness, no bladder incontinence and no paresthesias. He has tried analgesics for the symptoms. The treatment provided moderate relief.    Past Medical History:  Diagnosis Date  . Arthritis    left hip  . BPH (benign prostatic hyperplasia)   . Cancer [facility])    Skin cancers removed  . Carotid artery occlusion   . COPD (chronic obstructive pulmonary disease) ([place])   . Diabetes mellitus 2011   Border line  . Diabetes mellitus without complication ([place])   . Hyperlipidemia   . Hypertension   . Hypothyroidism   . Macular degeneration   . Peripheral arterial disease ([place]) 2002  . Peripheral vascular disease ([place])   . Peripheral vascular disease (HCC)    PAD  . Stroke ([place]) 2005  . Stroke [facility])    2005 - Dr Michael Boston - [place]    Patient Active Problem List   Diagnosis Date Noted  . Low back pain with right-sided sciatica 12/01/2016  . Diabetes mellitus type 2, controlled ([place]) 12/01/2016  . CAD (coronary artery disease) 12/01/2016  .  Hypothyroidism 12/01/2016  . Low back pain 12/01/2016  . Back pain 11/02/2016  . Aftercare following surgery of the circulatory system, [place] 06/28/2013  . Cough 04/18/2013  . COPD (chronic obstructive pulmonary disease) ([place]) 11/22/2012  . Occlusion and stenosis of carotid artery without mention of cerebral infarction 06/29/2012  . Peripheral vascular disease ([place]) 12/30/2011    Past Surgical History:  Procedure Laterality Date  . ABDOMINAL AORTIC ANEURYSM REPAIR  2003   Aorto-left femoral-right iliac BPG by Dr. Scot Dock  . Aortic aneursym surgery     2003- Dr. Scot Dock  . BACK SURGERY     1960  . CAROTID ENDARTERECTOMY  Jan. 25,2007   Right CEA  . Carotid stenosis surgery on right     2007  . CARPAL TUNNEL RELEASE Left 06/02/2016   Procedure: LEFT CARPAL TUNNEL RELEASE;  Surgeon: Daryll Brod, MD;  Location: [facility];  Service: Orthopedics;  Laterality: Left;  . EYE SURGERY     Catarct surgery and lens implant - bilateral  . PR VEIN BYPASS GRAFT,AORTO-FEM-POP  2005   Revision Right Fem-pop  . PR VEIN BYPASS GRAFT,AORTO-FEM-POP  2002   Right Fem-pop  . RADIOLOGY WITH ANESTHESIA N/A 12/02/2016   Procedure: MRI LUMBAR SPINE WITHOUT;  Surgeon: Medication Radiologist, MD;  Location: [facility];  Service: Radiology;  Laterality: N/A;  . Right leg bypass     2002 -  and amputation of right fifth toe  .  SPINE SURGERY  1960   lumbar spine  . [place], 2007   cervical spine  . TOE AMPUTATION  08-2001   right 5th toe amputation       Home Medications    Prior to Admission medications   Medication Sig Start Date End Date Taking? Authorizing Provider  acetaminophen (TYLENOL) 325 MG tablet Take 650 mg by mouth every 6 (six) hours as needed for mild pain.     Historical Provider, MD  albuterol (PROVENTIL HFA;VENTOLIN HFA) 108 (90 BASE) MCG/ACT inhaler Inhale 2 puffs into the lungs every 6 (six) hours as needed for wheezing or shortness of breath. 07/16/15   Collene Gobble, MD  atenolol-chlorthalidone (TENORETIC) 50-25 MG per tablet Take 0.5 tablets by mouth daily.  04/24/13   Historical Provider, MD  clopidogrel (PLAVIX) 75 MG tablet Take 75 mg by mouth daily.    Historical Provider, MD  ezetimibe-simvastatin (VYTORIN) 10-40 MG per tablet Take 1 tablet by mouth See admin instructions. Take Monday, Wednesday, and Friday in evening.    Historical Provider, MD  guaiFENesin (MUCINEX) 600 MG 12 hr tablet Take 600 mg by mouth every evening.     Historical Provider, MD  HYDROcodone-acetaminophen (NORCO/VICODIN) 5-325 MG tablet Take 1 tablet by mouth every 4 (four) hours as needed for moderate pain. 12/04/16   Shanker Kristeen Mans, MD  levothyroxine (SYNTHROID, LEVOTHROID) 25 MCG tablet 1.5-2 tablets every morning. Take 1.5 tablet Monday-Friday, and 2 tabs on Saturday and Sunday. 12/31/15   Historical Provider, MD  loratadine (CLARITIN) 10 MG tablet Take 10 mg by mouth daily as needed for allergies. Reported on 01/03/2016    Historical Provider, MD  methocarbamol (ROBAXIN) 500 MG tablet Take 1 tablet (500 mg total) by mouth 2 (two) times daily. 01/06/17 01/16/17  Fatima Blank, MD  Multiple Vitamins-Minerals (CENTRUM SILVER PO) Take 1 tablet by mouth daily.    Historical Provider, MD  Multiple Vitamins-Minerals (OCUVITE PRESERVISION PO) Take 1 tablet by mouth 2 (two) times daily.    Historical Provider, MD  sitaGLIPtan-metformin (JANUMET) 50-500 MG per tablet Take 1 tablet by mouth 2 (two) times daily with a meal.    Historical Provider, MD    Family History Family History  Problem Relation Age of Onset  . Stroke Mother   . Hypertension Mother   . Heart disease Father   . Heart attack Father   . Cancer Brother   . Hypertension Brother     Social History Social History  Substance Use Topics  . Smoking status: Former Smoker    Packs/day: 1.00    Years: 63.00    Types: Cigarettes    Quit date: 04/09/2004  . Smokeless tobacco: Former Systems developer     Comment: Quit  smoking in 2005  . Alcohol use No     Allergies   Iohexol and Iodine   Review of Systems Review of Systems  Constitutional: Negative for fever.  Cardiovascular: Negative for chest pain.  Gastrointestinal: Negative for bowel incontinence.  Genitourinary: Negative for bladder incontinence.  Musculoskeletal: Positive for back pain.  Neurological: Negative for numbness and paresthesias.  Ten systems are reviewed and are negative for acute change except as noted in the HPI    Physical Exam Updated Vital Signs BP (!) 154/102 (BP Location: Right Arm)   Pulse 103   Temp 97.7 F (36.5 C) (Oral)   Resp 14   Ht 5\' 6"  (1.676 m)   Wt 153 lb (69.4 kg)   SpO2  99%   BMI 24.69 kg/m   Physical Exam  Constitutional: He is oriented to person, place, and time. He appears well-developed and well-nourished. No distress.  HENT:  Head: Normocephalic and atraumatic.  Nose: Nose normal.  Eyes: Conjunctivae and EOM are normal. Pupils are equal, round, and reactive to light. Right eye exhibits no discharge. Left eye exhibits no discharge. No scleral icterus.  Neck: Normal range of motion. Neck supple.  Cardiovascular: Normal rate and regular rhythm.  Exam reveals no gallop and no friction rub.   No murmur heard. Pulmonary/Chest: Effort normal and breath sounds normal. No stridor. No respiratory distress. He has no rales.  Abdominal: Soft. He exhibits no distension. There is no tenderness.  Musculoskeletal: He exhibits no edema.       Lumbar back: He exhibits tenderness (mild).       Back:       Right upper leg: He exhibits tenderness (along the lateral aspect of right thigh). He exhibits no swelling and no edema.       Legs: Neurological: He is alert and oriented to person, place, and time.  Reflex Scores:      Tricep reflexes are 1+ on the right side. Spine Exam:  Strength: 5/5 throughout LE bilaterally (hip flexion/extension, adduction/abduction; knee flexion/extension; foot  dorsiflexion/plantarflexion, inversion/eversion; great toe inversion) Sensation: Intact to light touch in proximal and distal LE bilaterally Reflexes:1+ quadriceps and achilles reflexes    Skin: Skin is warm and dry. No rash noted. He is not diaphoretic. No erythema.  Psychiatric: He has a normal mood and affect.  Vitals reviewed.    ED Treatments / Results  Labs (all labs ordered are listed, but only abnormal results are displayed) Labs Reviewed - No data to display  EKG  EKG Interpretation None       Radiology No results found.  Procedures Procedures (including critical care time)  Medications Ordered in ED Medications  methocarbamol (ROBAXIN) tablet 500 mg (500 mg Oral Given 01/06/17 1055)  HYDROcodone-acetaminophen (NORCO/VICODIN) 5-325 MG per tablet 1 tablet (1 tablet Oral Given 01/06/17 1258)     Initial Impression / Assessment and Plan / ED Course  I have reviewed the triage vital signs and the nursing notes.  Pertinent labs & imaging results that were available during my care of the patient were reviewed by me and considered in my medical decision making (see chart for details).     Low suspicion for DVT. Likely secondary to known severe lumbar stenosis and sciatica. Believe patient is having muscle spasms due to nerve impingement/irritation. Currently only having mild pain. No recent falls or trauma. Do not feel that imaging is necessary at this time. Patient provided with by mouth Robaxin and his scheduled Norco. Able to ambulate without complication. Patient already has spine surgeon and a scheduled appointment. Instructed to follow-up with his surgeon. Safe for discharge with strict return precautions.  Final Clinical Impressions(s) / ED Diagnoses   Final diagnoses:  Chronic bilateral low back pain with right-sided sciatica  Muscle spasm   Disposition: Discharge  Condition: Good  I have discussed the results, Dx and Tx plan with the patient who  expressed understanding and agree(s) with the plan. Discharge instructions discussed at great length. The patient was given strict return precautions who verbalized understanding of the instructions. No further questions at time of discharge.    Discharge Medication List as of 01/06/2017  2:53 PM    START taking these medications   Details  methocarbamol (ROBAXIN) 500 MG  tablet Take 1 tablet (500 mg total) by mouth 2 (two) times daily., Starting Wed 01/06/2017, Until Sat 01/16/2017, Print        Follow Up: Orthopedic Spine surgery   For close follow up  [place] Chard, [address] [place] [address] [phone]  Schedule an appointment as soon as possible for a visit  As needed      Fatima Blank, MD 01/06/17 1729

## 2017-01-07 NOTE — Progress Notes (Signed)
New PT note to include G codes.  Collie Siad PT, DPT    November 19, 2016 0945  PT G-Codes **NOT FOR INPATIENT CLASS**  Functional Assessment Tool Used Clinical judgement  Functional Limitation Mobility: Walking and moving around  Mobility: Walking and Moving Around Current Status [id number]) CI  Mobility: Walking and Moving Around Goal Status [id number]) CI

## 2017-01-22 ENCOUNTER — Encounter (INDEPENDENT_AMBULATORY_CARE_PROVIDER_SITE_OTHER): Payer: Self-pay | Admitting: Orthopaedic Surgery

## 2017-01-22 ENCOUNTER — Ambulatory Visit (INDEPENDENT_AMBULATORY_CARE_PROVIDER_SITE_OTHER): Payer: Medicare Other | Admitting: Orthopaedic Surgery

## 2017-01-22 VITALS — BP 133/79 | HR 89 | Ht 66.0 in | Wt 150.0 lb

## 2017-01-22 DIAGNOSIS — M5441 Lumbago with sciatica, right side: Secondary | ICD-10-CM

## 2017-01-22 DIAGNOSIS — G8929 Other chronic pain: Secondary | ICD-10-CM

## 2017-01-22 NOTE — Progress Notes (Signed)
Office Visit Note   Patient: Dylan Knight           Date of Birth: 1924-06-02           MRN: [id number] Visit Date: 01/22/2017              Requested by: Glendale Chard, MD [address] [place], [place] [address] PCP: Maximino Greenland, MD   Assessment & Plan: Visit Diagnoses:  1. Chronic right-sided low back pain with right-sided sciatica     Plan: Patient's pain has subsided. He sleeping in bed now he is walking better pain is less during the day. He'll continue walking gradually increasing activities and return when necessary. Patient has multilevel lumbar spondylosis with moderate central and lateral recess narrowing as well as foraminal narrowing at multiple levels. No further testing is ordered at this time.  Follow-Up Instructions: Return if symptoms worsen or fail to improve.   Orders:  No orders of the defined types were placed in this encounter.  No orders of the defined types were placed in this encounter.     Procedures: No procedures performed   Clinical Data: No additional findings.   Subjective: Chief Complaint  Patient presents with  . Lower Back - Follow-up    Patient returns for 1 month follow up for his lumbar spine.  Patient reports that he is feeling much better and pain has improved. He actually has been able to lay in bed.  He is still having some right radicular leg pain.  Ambulates with walker.     Review of Systems 14 point review of systems updated and is unchanged from 12/15/2016 office visit other than improvement in his lumbar spine symptoms increased mobility decreasing pain. He denies fever chills no bowel bladder symptoms.   Objective: Vital Signs: BP 133/79 (BP Location: Left Arm, Patient Position: Sitting)   Pulse 89   Ht 5\' 6"  (1.676 m)   Wt 150 lb (68 kg)   BMI 24.21 kg/m   Physical Exam  Constitutional: He is oriented to person, place, and time. He appears well-developed and well-nourished.  HENT:  Head:  Normocephalic and atraumatic.  Eyes: EOM are normal. Pupils are equal, round, and reactive to light.  Patient was glasses  Neck: No tracheal deviation present. No thyromegaly present.  Cardiovascular: Normal rate.   Pulmonary/Chest: Effort normal. He has no wheezes.  Abdominal: Soft. Bowel sounds are normal.  Musculoskeletal:  Patient still immature with a walker. He gets from sitting to standing more comfortably. Negative straight leg raising 90. Anterior tib foot dorsiflexion plantar flexion is intact reflexes are symmetrical. He has intact distal pulses no rash or exposed skin normal capillary refill. Hip range of motion is normal negative Corky Sox test.  Neurological: He is alert and oriented to person, place, and time.  Skin: Skin is warm and dry. Capillary refill takes less than 2 seconds.  Psychiatric: He has a normal mood and affect. His behavior is normal. Judgment and thought content normal.    Ortho Exam mild DIP degenerative changes in his fingers. Good grip strength. Good cervical range of motion. No lower extremity dark dermatomal loss of sensation.  Specialty Comments:  No specialty comments available.  Imaging: No results found.   PMFS History: Patient Active Problem List   Diagnosis Date Noted  . Low back pain with right-sided sciatica 12/01/2016  . Diabetes mellitus type 2, controlled ([place]) 12/01/2016  . CAD (coronary artery disease) 12/01/2016  . Hypothyroidism 12/01/2016  .  Low back pain 12/01/2016  . Back pain 11/02/2016  . Aftercare following surgery of the circulatory system, [place] 06/28/2013  . Cough 04/18/2013  . COPD (chronic obstructive pulmonary disease) ([place]) 11/22/2012  . Occlusion and stenosis of carotid artery without mention of cerebral infarction 06/29/2012  . Peripheral vascular disease ([place]) 12/30/2011   Past Medical History:  Diagnosis Date  . Arthritis    left hip  . BPH (benign prostatic hyperplasia)   . Cancer [facility])    Skin cancers  removed  . Carotid artery occlusion   . COPD (chronic obstructive pulmonary disease) ([place])   . Diabetes mellitus 2011   Border line  . Diabetes mellitus without complication ([place])   . Hyperlipidemia   . Hypertension   . Hypothyroidism   . Macular degeneration   . Peripheral arterial disease ([place]) 2002  . Peripheral vascular disease ([place])   . Peripheral vascular disease (HCC)    PAD  . Stroke ([place]) 2005  . Stroke [facility])    2005 - Dr Michael Boston - [place]    Family History  Problem Relation Age of Onset  . Stroke Mother   . Hypertension Mother   . Heart disease Father   . Heart attack Father   . Cancer Brother   . Hypertension Brother     Past Surgical History:  Procedure Laterality Date  . ABDOMINAL AORTIC ANEURYSM REPAIR  2003   Aorto-left femoral-right iliac BPG by Dr. Scot Dock  . Aortic aneursym surgery     2003- Dr. Scot Dock  . BACK SURGERY     1960  . CAROTID ENDARTERECTOMY  Jan. 25,2007   Right CEA  . Carotid stenosis surgery on right     2007  . CARPAL TUNNEL RELEASE Left 06/02/2016   Procedure: LEFT CARPAL TUNNEL RELEASE;  Surgeon: Daryll Brod, MD;  Location: [place];  Service: Orthopedics;  Laterality: Left;  . EYE SURGERY     Catarct surgery and lens implant - bilateral  . PR VEIN BYPASS GRAFT,AORTO-FEM-POP  2005   Revision Right Fem-pop  . PR VEIN BYPASS GRAFT,AORTO-FEM-POP  2002   Right Fem-pop  . RADIOLOGY WITH ANESTHESIA N/A 12/02/2016   Procedure: MRI LUMBAR SPINE WITHOUT;  Surgeon: Medication Radiologist, MD;  Location: [place];  Service: Radiology;  Laterality: N/A;  . Right leg bypass     2002 -  and amputation of right fifth toe  . SPINE SURGERY  1960   lumbar spine  . [place], 2007   cervical spine  . TOE AMPUTATION  08-2001   right 5th toe amputation   Social History   Occupational History  . Not on file.   Social History Main Topics  . Smoking status: Former Smoker    Packs/day: 1.00    Years: 63.00      Types: Cigarettes    Quit date: 04/09/2004  . Smokeless tobacco: Former Systems developer     Comment: Quit smoking in 2005  . Alcohol use No  . Drug use: No  . Sexual activity: Not on file

## 2017-01-28 ENCOUNTER — Encounter (INDEPENDENT_AMBULATORY_CARE_PROVIDER_SITE_OTHER): Payer: Medicare Other | Admitting: Ophthalmology

## 2017-01-28 DIAGNOSIS — I10 Essential (primary) hypertension: Secondary | ICD-10-CM | POA: Diagnosis not present

## 2017-01-28 DIAGNOSIS — H348112 Central retinal vein occlusion, right eye, stable: Secondary | ICD-10-CM

## 2017-01-28 DIAGNOSIS — E113293 Type 2 diabetes mellitus with mild nonproliferative diabetic retinopathy without macular edema, bilateral: Secondary | ICD-10-CM | POA: Diagnosis not present

## 2017-01-28 DIAGNOSIS — H35033 Hypertensive retinopathy, bilateral: Secondary | ICD-10-CM | POA: Diagnosis not present

## 2017-01-28 DIAGNOSIS — H43813 Vitreous degeneration, bilateral: Secondary | ICD-10-CM

## 2017-01-28 DIAGNOSIS — E11319 Type 2 diabetes mellitus with unspecified diabetic retinopathy without macular edema: Secondary | ICD-10-CM | POA: Diagnosis not present

## 2017-01-28 DIAGNOSIS — H353221 Exudative age-related macular degeneration, left eye, with active choroidal neovascularization: Secondary | ICD-10-CM | POA: Diagnosis not present

## 2017-01-30 ENCOUNTER — Observation Stay (HOSPITAL_COMMUNITY)
Admission: EM | Admit: 2017-01-30 | Discharge: 2017-02-01 | Disposition: A | Discharge status: Home / Self Care | Payer: Medicare Other | Attending: Internal Medicine | Admitting: Internal Medicine

## 2017-01-30 ENCOUNTER — Inpatient Hospital Stay (HOSPITAL_COMMUNITY): Payer: Medicare Other

## 2017-01-30 ENCOUNTER — Encounter (HOSPITAL_COMMUNITY): Payer: Self-pay | Admitting: Emergency Medicine

## 2017-01-30 ENCOUNTER — Emergency Department (HOSPITAL_COMMUNITY): Payer: Medicare Other

## 2017-01-30 DIAGNOSIS — H3530 Unspecified macular degeneration: Secondary | ICD-10-CM | POA: Insufficient documentation

## 2017-01-30 DIAGNOSIS — N400 Enlarged prostate without lower urinary tract symptoms: Secondary | ICD-10-CM | POA: Insufficient documentation

## 2017-01-30 DIAGNOSIS — G458 Other transient cerebral ischemic attacks and related syndromes: Secondary | ICD-10-CM

## 2017-01-30 DIAGNOSIS — Z79899 Other long term (current) drug therapy: Secondary | ICD-10-CM | POA: Insufficient documentation

## 2017-01-30 DIAGNOSIS — I2510 Atherosclerotic heart disease of native coronary artery without angina pectoris: Secondary | ICD-10-CM | POA: Diagnosis present

## 2017-01-30 DIAGNOSIS — E785 Hyperlipidemia, unspecified: Secondary | ICD-10-CM | POA: Diagnosis not present

## 2017-01-30 DIAGNOSIS — I6523 Occlusion and stenosis of bilateral carotid arteries: Secondary | ICD-10-CM

## 2017-01-30 DIAGNOSIS — E1159 Type 2 diabetes mellitus with other circulatory complications: Secondary | ICD-10-CM

## 2017-01-30 DIAGNOSIS — Z888 Allergy status to other drugs, medicaments and biological substances status: Secondary | ICD-10-CM | POA: Insufficient documentation

## 2017-01-30 DIAGNOSIS — Z85828 Personal history of other malignant neoplasm of skin: Secondary | ICD-10-CM | POA: Insufficient documentation

## 2017-01-30 DIAGNOSIS — I69398 Other sequelae of cerebral infarction: Secondary | ICD-10-CM | POA: Diagnosis not present

## 2017-01-30 DIAGNOSIS — E039 Hypothyroidism, unspecified: Secondary | ICD-10-CM | POA: Diagnosis present

## 2017-01-30 DIAGNOSIS — G9389 Other specified disorders of brain: Secondary | ICD-10-CM | POA: Insufficient documentation

## 2017-01-30 DIAGNOSIS — E1121 Type 2 diabetes mellitus with diabetic nephropathy: Secondary | ICD-10-CM | POA: Diagnosis not present

## 2017-01-30 DIAGNOSIS — J441 Chronic obstructive pulmonary disease with (acute) exacerbation: Secondary | ICD-10-CM | POA: Diagnosis present

## 2017-01-30 DIAGNOSIS — Z7984 Long term (current) use of oral hypoglycemic drugs: Secondary | ICD-10-CM | POA: Diagnosis not present

## 2017-01-30 DIAGNOSIS — G459 Transient cerebral ischemic attack, unspecified: Secondary | ICD-10-CM | POA: Diagnosis not present

## 2017-01-30 DIAGNOSIS — Z7902 Long term (current) use of antithrombotics/antiplatelets: Secondary | ICD-10-CM | POA: Insufficient documentation

## 2017-01-30 DIAGNOSIS — Z8673 Personal history of transient ischemic attack (TIA), and cerebral infarction without residual deficits: Secondary | ICD-10-CM

## 2017-01-30 DIAGNOSIS — I639 Cerebral infarction, unspecified: Secondary | ICD-10-CM | POA: Diagnosis not present

## 2017-01-30 DIAGNOSIS — Z8679 Personal history of other diseases of the circulatory system: Secondary | ICD-10-CM | POA: Insufficient documentation

## 2017-01-30 DIAGNOSIS — R4701 Aphasia: Secondary | ICD-10-CM | POA: Insufficient documentation

## 2017-01-30 DIAGNOSIS — M5441 Lumbago with sciatica, right side: Secondary | ICD-10-CM | POA: Diagnosis not present

## 2017-01-30 DIAGNOSIS — I739 Peripheral vascular disease, unspecified: Secondary | ICD-10-CM | POA: Diagnosis not present

## 2017-01-30 DIAGNOSIS — Z791 Long term (current) use of non-steroidal anti-inflammatories (NSAID): Secondary | ICD-10-CM | POA: Insufficient documentation

## 2017-01-30 DIAGNOSIS — Z87891 Personal history of nicotine dependence: Secondary | ICD-10-CM | POA: Insufficient documentation

## 2017-01-30 DIAGNOSIS — E119 Type 2 diabetes mellitus without complications: Secondary | ICD-10-CM

## 2017-01-30 DIAGNOSIS — J449 Chronic obstructive pulmonary disease, unspecified: Secondary | ICD-10-CM | POA: Insufficient documentation

## 2017-01-30 DIAGNOSIS — I10 Essential (primary) hypertension: Secondary | ICD-10-CM | POA: Diagnosis not present

## 2017-01-30 DIAGNOSIS — E1151 Type 2 diabetes mellitus with diabetic peripheral angiopathy without gangrene: Secondary | ICD-10-CM | POA: Diagnosis not present

## 2017-01-30 LAB — TROPONIN I
Troponin I: <0.03 ng/mL (ref <0.03)
Troponin I: <0.03 ng/mL (ref <0.03)

## 2017-01-30 LAB — COMPREHENSIVE METABOLIC PANEL
ALT: 14 U/L — ABNORMAL LOW (ref 17–63)
AST: 32 U/L (ref 15–41)
Albumin: 4.2 g/dL (ref 3.5–5.0)
Alkaline Phosphatase: 40 U/L (ref 38–126)
Anion gap: 9 (ref 5–15)
BUN: 13 mg/dL (ref 6–20)
CO2: 26 mmol/L (ref 22–32)
Calcium: 9.6 mg/dL (ref 8.9–10.3)
Chloride: 97 mmol/L — ABNORMAL LOW (ref 101–111)
Creatinine, Ser: 1.1 mg/dL (ref 0.61–1.24)
GFR calc Af Amer: >60 mL/min (ref >60)
GFR calc non Af Amer: 57 mL/min — ABNORMAL LOW (ref >60)
Glucose, Bld: 123 mg/dL — ABNORMAL HIGH (ref 65–99)
Potassium: 4.1 mmol/L (ref 3.5–5.1)
Sodium: 132 mmol/L — ABNORMAL LOW (ref 135–145)
Total Bilirubin: 1.2 mg/dL (ref 0.3–1.2)
Total Protein: 6.3 g/dL — ABNORMAL LOW (ref 6.5–8.1)

## 2017-01-30 LAB — URINALYSIS, ROUTINE W REFLEX MICROSCOPIC
Bilirubin Urine: NEGATIVE
Glucose, UA: NEGATIVE mg/dL
Hgb urine dipstick: NEGATIVE
Ketones, ur: NEGATIVE mg/dL
Leukocytes, UA: NEGATIVE
Nitrite: NEGATIVE
Protein, ur: NEGATIVE mg/dL
Specific Gravity, Urine: 1.01 (ref 1.005–1.030)
pH: 7.5 (ref 5.0–8.0)

## 2017-01-30 LAB — CBC
HCT: 44.9 % (ref 39.0–52.0)
Hemoglobin: 15.6 g/dL (ref 13.0–17.0)
MCH: 34.2 pg — ABNORMAL HIGH (ref 26.0–34.0)
MCHC: 34.7 g/dL (ref 30.0–36.0)
MCV: 98.5 fL (ref 78.0–100.0)
Platelets: 182 10*3/uL (ref 150–400)
RBC: 4.56 MIL/uL (ref 4.22–5.81)
RDW: 13.3 % (ref 11.5–15.5)
WBC: 7.1 10*3/uL (ref 4.0–10.5)

## 2017-01-30 LAB — RAPID URINE DRUG SCREEN, HOSP PERFORMED
Amphetamines: NOT DETECTED
Barbiturates: NOT DETECTED
Benzodiazepines: NOT DETECTED
Cocaine: NOT DETECTED
Opiates: NOT DETECTED
Tetrahydrocannabinol: NOT DETECTED

## 2017-01-30 LAB — GLUCOSE, CAPILLARY: Glucose-Capillary: 166 mg/dL — ABNORMAL HIGH (ref 65–99)

## 2017-01-30 LAB — DIFFERENTIAL
Basophils Absolute: 0 10*3/uL (ref 0.0–0.1)
Basophils Relative: 0 %
Eosinophils Absolute: 0.2 10*3/uL (ref 0.0–0.7)
Eosinophils Relative: 3 %
Lymphocytes Relative: 15 %
Lymphs Abs: 1.1 10*3/uL (ref 0.7–4.0)
Monocytes Absolute: 0.7 10*3/uL (ref 0.1–1.0)
Monocytes Relative: 10 %
Neutro Abs: 5.1 10*3/uL (ref 1.7–7.7)
Neutrophils Relative %: 72 %

## 2017-01-30 LAB — I-STAT TROPONIN, ED: Troponin i, poc: 0 ng/mL (ref 0.00–0.08)

## 2017-01-30 LAB — TSH: TSH: 2.39 u[IU]/mL (ref 0.350–4.500)

## 2017-01-30 LAB — CREATININE, SERUM
Creatinine, Ser: 1.12 mg/dL (ref 0.61–1.24)
GFR calc Af Amer: >60 mL/min (ref >60)
GFR calc non Af Amer: 55 mL/min — ABNORMAL LOW (ref >60)

## 2017-01-30 LAB — CBG MONITORING, ED: Glucose-Capillary: 123 mg/dL — ABNORMAL HIGH (ref 65–99)

## 2017-01-30 LAB — APTT: aPTT: 32 seconds (ref 24–36)

## 2017-01-30 LAB — PROTIME-INR
INR: 1.01
Prothrombin Time: 13.3 seconds (ref 11.4–15.2)

## 2017-01-30 LAB — ETHANOL: Alcohol, Ethyl (B): <5 mg/dL (ref <5)

## 2017-01-30 MED ORDER — HYDRALAZINE HCL 20 MG/ML IJ SOLN: 5.0000 mg | INTRAMUSCULAR | Status: DC | PRN

## 2017-01-30 MED ORDER — ALBUTEROL SULFATE (2.5 MG/3ML) 0.083% IN NEBU: 3.0000 mL | INHALATION_SOLUTION | Freq: Four times a day (QID) | RESPIRATORY_TRACT | Status: DC | PRN

## 2017-01-30 MED ORDER — INSULIN ASPART 100 UNIT/ML ~~LOC~~ SOLN: 0.0000 [IU] | Freq: Every day | SUBCUTANEOUS | Status: DC

## 2017-01-30 MED ORDER — ATENOLOL 25 MG PO TABS
25.0000 mg | ORAL_TABLET | Freq: Every day | ORAL | Status: DC
  Administered 2017-01-31 (×2): 25 mg via ORAL
  Filled 2017-01-30 (×3): qty 1

## 2017-01-30 MED ORDER — CHLORTHALIDONE 25 MG PO TABS
12.5000 mg | ORAL_TABLET | Freq: Every day | ORAL | Status: DC
  Administered 2017-01-31 (×2): 12.5 mg via ORAL
  Filled 2017-01-30 (×3): qty 1

## 2017-01-30 MED ORDER — HYDROCODONE-ACETAMINOPHEN 5-325 MG PO TABS
1.0000 | ORAL_TABLET | ORAL | Status: DC | PRN
  Administered 2017-01-31 (×2): 1 via ORAL
  Filled 2017-01-30 (×4): qty 1

## 2017-01-30 MED ORDER — ATENOLOL-CHLORTHALIDONE 50-25 MG PO TABS: 0.5000 | ORAL_TABLET | Freq: Every day | ORAL | Status: DC

## 2017-01-30 MED ORDER — POLYETHYLENE GLYCOL 3350 17 G PO PACK: 17.0000 g | PACK | Freq: Every day | ORAL | Status: DC | PRN

## 2017-01-30 MED ORDER — STROKE: EARLY STAGES OF RECOVERY BOOK
Freq: Once | Status: DC
  Filled 2017-01-30: qty 1

## 2017-01-30 MED ORDER — LEVOTHYROXINE SODIUM 75 MCG PO TABS
37.5000 ug | ORAL_TABLET | ORAL | Status: DC
  Administered 2017-02-01: 37.5 ug via ORAL
  Filled 2017-01-30: qty 1

## 2017-01-30 MED ORDER — HEPARIN SODIUM (PORCINE) 5000 UNIT/ML IJ SOLN
5000.0000 [IU] | Freq: Three times a day (TID) | INTRAMUSCULAR | Status: DC
  Administered 2017-01-31 – 2017-02-01 (×6): 5000 [IU] via SUBCUTANEOUS
  Filled 2017-01-30 (×6): qty 1

## 2017-01-30 MED ORDER — INSULIN ASPART 100 UNIT/ML ~~LOC~~ SOLN
0.0000 [IU] | Freq: Three times a day (TID) | SUBCUTANEOUS | Status: DC
  Administered 2017-01-31 – 2017-02-01 (×2): 2 [IU] via SUBCUTANEOUS
  Administered 2017-02-01: 1 [IU] via SUBCUTANEOUS

## 2017-01-30 MED ORDER — LEVOTHYROXINE SODIUM 25 MCG PO TABS: 937.5000 ug | ORAL_TABLET | ORAL | Status: DC

## 2017-01-30 MED ORDER — GUAIFENESIN ER 600 MG PO TB12
600.0000 mg | ORAL_TABLET | Freq: Every evening | ORAL | Status: DC
  Administered 2017-01-31: 600 mg via ORAL
  Filled 2017-01-30: qty 1

## 2017-01-30 MED ORDER — SODIUM CHLORIDE 0.9% FLUSH
3.0000 mL | Freq: Two times a day (BID) | INTRAVENOUS | Status: DC
  Administered 2017-01-31 – 2017-02-01 (×3): 3 mL via INTRAVENOUS

## 2017-01-30 MED ORDER — EZETIMIBE-SIMVASTATIN 10-40 MG PO TABS
1.0000 | ORAL_TABLET | ORAL | Status: DC
  Filled 2017-01-30: qty 1

## 2017-01-30 MED ORDER — CLOPIDOGREL BISULFATE 75 MG PO TABS
75.0000 mg | ORAL_TABLET | Freq: Every day | ORAL | Status: DC
  Administered 2017-01-31 – 2017-02-01 (×2): 75 mg via ORAL
  Filled 2017-01-30 (×2): qty 1

## 2017-01-30 MED ORDER — ACETAMINOPHEN 650 MG RE SUPP: 650.0000 mg | Freq: Four times a day (QID) | RECTAL | Status: DC | PRN

## 2017-01-30 MED ORDER — ACETAMINOPHEN 325 MG PO TABS
650.0000 mg | ORAL_TABLET | Freq: Four times a day (QID) | ORAL | Status: DC | PRN
  Administered 2017-02-01: 650 mg via ORAL
  Filled 2017-01-30: qty 2

## 2017-01-30 MED ORDER — LEVOTHYROXINE SODIUM 50 MCG PO TABS
50.0000 ug | ORAL_TABLET | ORAL | Status: DC
  Administered 2017-01-31: 50 ug via ORAL
  Filled 2017-01-30: qty 1

## 2017-01-30 NOTE — Consult Note (Addendum)
Neurology Consult Note  Reason for Consultation: Possible TIA  Requesting provider: Carlisle Cater, PA  CC: "Can't talk"  HPI: This is a 83 year old man who presented to [facility] emergency department following an episode of speech difficulty earlier today. History is obtained largely from the patient's wife as the patient is aphasic and has difficulty expressing himself.   His wife reports that they woke up about 8:30 this morning and the patient was in his usual state of health. They had breakfast around 10 AM at which time she noticed that he was having difficulty getting his words out. She also noticed that he had some left facial droop as well as a tremor involving the left hand. He was brought to the [facility] emergency department for further evaluation. He and his wife report that symptoms have improved somewhat. He no longer has a left facial droop. However, he continues to have tremor of his left hand as well as ongoing difficulty expressing himself, which his wife says tends to fluctuate a little bit. He has never had these symptoms before. He denies any symptoms in his arms. He denies any sensory changes. He has not had any vision loss, blurry vision, or double vision. He denies any headache. He has had a previous stroke which left him with some mild residual weakness and difficulty using his right hand. Neurology consultation is requested for further recommendations.  Of note, he takes Plavix due to underlying peripheral vascular disease. He also has had previous carotid disease for which she had a carotid endarterectomy. His wife reports that every 6 months he is seen by his vascular surgeon and they have been told that his carotids have been clear.  PMH:  Past Medical History:  Diagnosis Date  . Arthritis    left hip  . BPH (benign prostatic hyperplasia)   . Cancer [facility])    Skin cancers removed  . Carotid artery occlusion   . COPD (chronic obstructive pulmonary disease) ([place])    . Diabetes mellitus 2011   Border line  . Diabetes mellitus without complication ([place])   . Hyperlipidemia   . Hypertension   . Hypothyroidism   . Macular degeneration   . Peripheral arterial disease ([place]) 2002  . Peripheral vascular disease ([place])   . Peripheral vascular disease (HCC)    PAD  . Stroke ([place]) 2005  . Stroke [facility])    2005 - Dr Michael Boston - [place]    PSH:  Past Surgical History:  Procedure Laterality Date  . ABDOMINAL AORTIC ANEURYSM REPAIR  2003   Aorto-left femoral-right iliac BPG by Dr. Scot Dock  . Aortic aneursym surgery     2003- Dr. Scot Dock  . BACK SURGERY     1960  . CAROTID ENDARTERECTOMY  Jan. 25,2007   Right CEA  . Carotid stenosis surgery on right     2007  . CARPAL TUNNEL RELEASE Left 06/02/2016   Procedure: LEFT CARPAL TUNNEL RELEASE;  Surgeon: Daryll Brod, MD;  Location: [place];  Service: Orthopedics;  Laterality: Left;  . EYE SURGERY     Catarct surgery and lens implant - bilateral  . PR VEIN BYPASS GRAFT,AORTO-FEM-POP  2005   Revision Right Fem-pop  . PR VEIN BYPASS GRAFT,AORTO-FEM-POP  2002   Right Fem-pop  . RADIOLOGY WITH ANESTHESIA N/A 12/02/2016   Procedure: MRI LUMBAR SPINE WITHOUT;  Surgeon: Medication Radiologist, MD;  Location: [place];  Service: Radiology;  Laterality: N/A;  . Right leg bypass  2002 -  and amputation of right fifth toe  . SPINE SURGERY  1960   lumbar spine  . [place], 2007   cervical spine  . TOE AMPUTATION  08-2001   right 5th toe amputation    Family history: Family History  Problem Relation Age of Onset  . Stroke Mother   . Hypertension Mother   . Heart disease Father   . Heart attack Father   . Cancer Brother   . Hypertension Brother     Social history:  Social History   Social History  . Marital status: Married    Spouse name: N/A  . Number of children: N/A  . Years of education: N/A   Occupational History  . Not on file.   Social History Main  Topics  . Smoking status: Former Smoker    Packs/day: 1.00    Years: 63.00    Types: Cigarettes    Quit date: 04/09/2004  . Smokeless tobacco: Former Systems developer     Comment: Quit smoking in 2005  . Alcohol use No  . Drug use: No  . Sexual activity: Not on file   Other Topics Concern  . Not on file   Social History Narrative   ** Merged History Encounter **        Current outpatient meds: Medications reviewed and reconciled.  Current Meds  Medication Sig  . acetaminophen (TYLENOL) 325 MG tablet Take 650 mg by mouth every 6 (six) hours as needed for mild pain.   Marland Kitchen albuterol (PROVENTIL HFA;VENTOLIN HFA) 108 (90 BASE) MCG/ACT inhaler Inhale 2 puffs into the lungs every 6 (six) hours as needed for wheezing or shortness of breath.  Marland Kitchen atenolol-chlorthalidone (TENORETIC) 50-25 MG per tablet Take 0.5 tablets by mouth daily.   . clopidogrel (PLAVIX) 75 MG tablet Take 75 mg by mouth daily.  Marland Kitchen ezetimibe-simvastatin (VYTORIN) 10-40 MG per tablet Take 1 tablet by mouth See admin instructions. Take 1 tablet by mouth daily on Monday, Wednesday, and Friday in evening.  Marland Kitchen guaiFENesin (MUCINEX) 600 MG 12 hr tablet Take 600 mg by mouth every evening.   Marland Kitchen levothyroxine (SYNTHROID, LEVOTHROID) 25 MCG tablet 37.5-50 tablets See admin instructions. Take 37.5 mcg by mouth daily Monday through Friday, and take 50 mcg by mouth daily on Saturday and Sunday.  . Multiple Vitamins-Minerals (CENTRUM SILVER PO) Take 1 tablet by mouth daily.  . Multiple Vitamins-Minerals (OCUVITE PRESERVISION PO) Take 1 tablet by mouth 2 (two) times daily.  . naproxen sodium (ANAPROX) 220 MG tablet Take 220 mg by mouth 2 (two) times daily with a meal.  . sitaGLIPtan-metformin (JANUMET) 50-500 MG per tablet Take 1 tablet by mouth 2 (two) times daily with a meal.    Current inpatient meds: Medications reviewed and reconciled.  No current facility-administered medications for this encounter.    Current Outpatient Prescriptions   Medication Sig Dispense Refill  . acetaminophen (TYLENOL) 325 MG tablet Take 650 mg by mouth every 6 (six) hours as needed for mild pain.     Marland Kitchen albuterol (PROVENTIL HFA;VENTOLIN HFA) 108 (90 BASE) MCG/ACT inhaler Inhale 2 puffs into the lungs every 6 (six) hours as needed for wheezing or shortness of breath. 1 Inhaler 6  . atenolol-chlorthalidone (TENORETIC) 50-25 MG per tablet Take 0.5 tablets by mouth daily.     . clopidogrel (PLAVIX) 75 MG tablet Take 75 mg by mouth daily.    Marland Kitchen ezetimibe-simvastatin (VYTORIN) 10-40 MG per tablet Take 1 tablet by mouth See admin instructions.  Take 1 tablet by mouth daily on Monday, Wednesday, and Friday in evening.    Marland Kitchen guaiFENesin (MUCINEX) 600 MG 12 hr tablet Take 600 mg by mouth every evening.     Marland Kitchen levothyroxine (SYNTHROID, LEVOTHROID) 25 MCG tablet 37.5-50 tablets See admin instructions. Take 37.5 mcg by mouth daily Monday through Friday, and take 50 mcg by mouth daily on Saturday and Sunday.    . Multiple Vitamins-Minerals (CENTRUM SILVER PO) Take 1 tablet by mouth daily.    . Multiple Vitamins-Minerals (OCUVITE PRESERVISION PO) Take 1 tablet by mouth 2 (two) times daily.    . naproxen sodium (ANAPROX) 220 MG tablet Take 220 mg by mouth 2 (two) times daily with a meal.    . sitaGLIPtan-metformin (JANUMET) 50-500 MG per tablet Take 1 tablet by mouth 2 (two) times daily with a meal.    . HYDROcodone-acetaminophen (NORCO/VICODIN) 5-325 MG tablet Take 1 tablet by mouth every 4 (four) hours as needed for moderate pain. (Patient not taking: Reported on 01/30/2017) 30 tablet 0  . loratadine (CLARITIN) 10 MG tablet Take 10 mg by mouth daily as needed for allergies. Reported on 01/03/2016      Allergies: Allergies  Allergen Reactions  . Iohexol Hives and Other (See Comments)    Went away with benadryl   . Iodine Rash    ROS: As per HPI. A full 14-point review of systems was performed and is otherwise notable for chronic low back pain and sciatica, both of  which seem to be doing well right now with no acute issue. The remainder of his review of systems is otherwise unremarkable.  PE:  BP (!) 129/94   Pulse 81   Temp 97.9 F (36.6 C)   Resp 15   Ht 5\' 6"  (4.193 m)   Wt 68 kg (150 lb)   SpO2 96%   BMI 24.21 kg/m   General: WDWN, sitting on the side of his bed in no acute distress. AAO x4. Speech with mild dysarthria. He has a moderate expressive aphasia. Receptive language appears to be relatively spared though he does have difficulty with more complex commands. Repetition and naming are impaired. This is all consistent with a global aphasia. Affect is bright with congruent mood. Comportment is normal.  HEENT: Normocephalic. Neck supple without LAD. Mucous membranes appear slightly dry, OP clear. Dentition good. Sclerae anicteric. No conjunctival injection.  CV: Regular, no murmur. Carotid pulses full and symmetric, no bruits. Distal pulses 2+ and symmetric.  Lungs: CTAB.  Abdomen: Soft, non-distended, non-tender. Bowel sounds present x4.  Extremities: No C/C/E. Toes of both feet are purple. Neuro:  CN: Pupils are equal and round. They are symmetrically reactive from 3-->2 mm. EOMI without nystagmus. He has breakup of smooth pursuits in all directions of gaze. No reported diplopia. Facial sensation is intact to light touch. Face is symmetric at rest with normal strength and mobility. Hearing is mildly diminished to conversational voice. Palate elevates symmetrically and uvula is midline. Voice is normal in tone, pitch and quality. Bilateral SCM and trapezii are 5/5. Tongue is midline with normal bulk and mobility.  Motor: Normal bulk, tone, and strength with possible exception of mild weakness in the right lower extremity, though this may be in part related to combination of effort and difficulty following commands because of his aphasia. He has a moderate amplitude low frequency tremor involving the left arm and leg which is best described as a  kinetic tremor that is greatest with action. No significant resting component  is appreciated. No drift.  Sensation: Intact to light touch.  DTRs: 2+ in the left arm, 3+ in the right arm. DTRs are absent in both legs. Toes mute bilaterally.  Coordination: Finger-to-nose is a little clumsy on the right due to old weakness. He has a tremor on the left but no dysmetria.    Labs:  Lab Results  Component Value Date   WBC 7.1 01/30/2017   HGB 15.6 01/30/2017   HCT 44.9 01/30/2017   PLT 182 01/30/2017   GLUCOSE 136 (H) 12/02/2016   CHOL 145 11/25/2014   TRIG 377 (H) 11/25/2014   HDL 42 11/25/2014   LDLCALC 28 11/25/2014   ALT 18 11/02/2016   AST 40 11/02/2016   NA 130 (L) 12/02/2016   K 3.7 12/02/2016   CL 96 (L) 12/02/2016   CREATININE 1.11 12/02/2016   BUN 15 12/02/2016   CO2 24 12/02/2016   TSH 10.7 (H) 11/25/2014   INR 1.01 01/30/2017   HGBA1C 6.7 (H) 12/01/2016   PTT 32 Troponin 0.00 CMP pending Urine drug screen pending Urinalysis pending  Imaging:  I have personally and independently reviewed CT scan of the head without contrast from today. This shows mild/moderate diffuse generalized atrophy that is likely appropriate for age. A mild degree of chronic small vessel ischemic disease is noted. He has hydrocephalus ex-vacuo due to overall atrophy. A large focal area of encephalomalacia is present in the left parietal lobe consistent with remote infarction. No acute abnormalities are noted.   Assessment and Plan:  1. Acute ischemic stroke: Presentation is most concerning for an acute ischemic stroke, possibly involving subcortical structures such as basal ganglia or thalamus. Known risk factors for cerebrovascular disease in this patient include age, history of carotid artery disease, diabetes, hyperlipidemia, hypertension, peripheral arterial disease, and prior stroke. Additional workup will be ordered to include MRI brain, carotid Dopplers, TTE, fasting lipids, and hemoglobin  a1c. Further testing will be determined by results from these initial studies. Recommend continuing antiplatelet therapy with Plavix for secondary stroke prevention. Continue statin with goal LDL less than 70. Ensure adequate blood pressure and glucose control. Avoid fever and hyperglycemia as these are associated with worse neurological outcome. DVT prophylaxis as needed.   2. Global aphasia: This is acute, due to presumed stroke. His expressive language is much more affected than receptive. Speech therapy/rehabilitation as needed.  3. Tremor: This is acute, due to presumed stroke. PT/OT/rehabilitation as needed.  4. Right upper extremity weakness: This is very mild and chronic, due to a previous stroke. No acute issues at this time.  5. Dysarthria: This is mild, likely acute due to stroke. Speech therapy/rehabilitation as needed. Nothing by mouth until cleared by swallow evaluation.  The patient would likely benefit from acute rehab services. Recommend consultation of PT/OT/SLP with consideration for PM&R consult as appropriate depending upon clinical progress and therapists' recommendations.   Fall risk: Risk factors for falls include age, polypharmacy, new onset tremor, impaired mobility/gait, and orthopedic issues. Patient educated on risk of falls and use of call bell. Patient was counseled not to get up without assistance. Limit psychoactive medications and sedating medications.  Delirium risk: Risk factors for delirium include acute stroke, severity of medical illness, medications/polypharmacy, sensory impairment (decreased hearing). Optimize metabolic status as needed. Treat any infection aggressively. Minimize the use of opiates, benzos or any medication with strong anticholinergic properties as much as possible. Optimize sleep-wake cycles as much as you can by keeping the room bright with activity during the  day and dark and quiet at night.   This was discussed with the patient and his  wife. Education was provided on the diagnosis and expected evaluation and treatment. They are in agreement with the plan as noted. They were given the opportunity to ask any questions and these were addressed to their satisfaction.   I also discussed my findings with the attending, Dr. Sherry Ruffing.   Thank you for this consultation. The stroke team will assume care of the patient beginning 01/31/17. Please call with any urgent questions or concerns.

## 2017-01-30 NOTE — ED Notes (Signed)
Pt enroute to inpatient floor,  No addl lab draw.

## 2017-01-30 NOTE — ED Provider Notes (Signed)
[facility] DEPT Provider Note   CSN: [id number] Arrival date & time: 01/30/17  1215     History   Chief Complaint Chief Complaint  Patient presents with  . Aphasia    HPI Dylan Knight is a 83 y.o. male.  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 Patient woke up this morning and felt normal. Approximate 10 AM while eating breakfast patient had acute onset of expressive aphasia and left-sided facial droop. He also developed a tremor in his left upper extremity. His wife called EMS. Symptoms gradually resolved upon EMS arrival. Patient currently has residual left upper extremity tremor. Patient denies headache, vision change. He was having some difficulty with ambulation but denies frank vertigo or dizziness. Patient without any other medical complaints. Onset of symptoms acute. Course is resolving. Nothing makes symptoms better or worse.      Past Medical History:  Diagnosis Date  . Arthritis    left hip  . BPH (benign prostatic hyperplasia)   . Cancer [facility])    Skin cancers removed  . Carotid artery occlusion   . COPD (chronic obstructive pulmonary disease) ([place])   . Diabetes mellitus 2011   Border line  . Diabetes mellitus without complication ([place])   . Hyperlipidemia   . Hypertension   . Hypothyroidism   . Macular degeneration   . Peripheral arterial disease ([place]) 2002  . Peripheral vascular disease ([place])   . Peripheral vascular disease (HCC)    PAD  . Stroke ([place]) 2005  . Stroke [facility])    2005 - Dr Michael Boston - [place]    Patient Active Problem List   Diagnosis Date Noted  . Low back pain with right-sided sciatica 12/01/2016  . Diabetes mellitus type 2, controlled ([place]) 12/01/2016  . CAD  (coronary artery disease) 12/01/2016  . Hypothyroidism 12/01/2016  . Low back pain 12/01/2016  . Back pain 11/02/2016  . Aftercare following surgery of the circulatory system, [place] 06/28/2013  . Cough 04/18/2013  . COPD (chronic obstructive pulmonary disease) ([place]) 11/22/2012  . Occlusion and stenosis of carotid artery without mention of cerebral infarction 06/29/2012  . Peripheral vascular disease ([place]) 12/30/2011    Past Surgical History:  Procedure Laterality Date  . ABDOMINAL AORTIC ANEURYSM REPAIR  2003   Aorto-left femoral-right iliac BPG by Dr. Scot Dock  . Aortic aneursym surgery     2003- Dr. Scot Dock  . BACK SURGERY     1960  . CAROTID ENDARTERECTOMY  Jan. 25,2007   Right CEA  . Carotid stenosis surgery on right     2007  . CARPAL TUNNEL RELEASE Left 06/02/2016   Procedure: LEFT CARPAL TUNNEL RELEASE;  Surgeon: Daryll Brod, MD;  Location: [facility];  Service: Orthopedics;  Laterality: Left;  . EYE SURGERY     Catarct surgery and lens implant - bilateral  . PR VEIN BYPASS GRAFT,AORTO-FEM-POP  2005   Revision Right Fem-pop  . PR VEIN BYPASS GRAFT,AORTO-FEM-POP  2002   Right Fem-pop  . RADIOLOGY WITH ANESTHESIA N/A 12/02/2016   Procedure: MRI LUMBAR SPINE WITHOUT;  Surgeon: Medication Radiologist, MD;  Location: [facility];  Service: Radiology;  Laterality: N/A;  . Right leg bypass     2002 -  and amputation of right fifth toe  . [place]  lumbar spine  . [place], 2007   cervical spine  . TOE AMPUTATION  08-2001   right 5th toe amputation       Home Medications    Prior to Admission medications   Medication Sig Start Date End Date Taking? Authorizing Provider  acetaminophen (TYLENOL) 325 MG tablet Take 650 mg by mouth every 6 (six) hours as needed for mild pain.    Yes Historical Provider, MD  albuterol (PROVENTIL HFA;VENTOLIN HFA) 108 (90 BASE) MCG/ACT inhaler Inhale 2 puffs into the lungs every 6 (six) hours as needed for  wheezing or shortness of breath. 07/16/15  Yes Collene Gobble, MD  atenolol-chlorthalidone (TENORETIC) 50-25 MG per tablet Take 0.5 tablets by mouth daily.  04/24/13  Yes Historical Provider, MD  clopidogrel (PLAVIX) 75 MG tablet Take 75 mg by mouth daily.   Yes Historical Provider, MD  ezetimibe-simvastatin (VYTORIN) 10-40 MG per tablet Take 1 tablet by mouth See admin instructions. Take 1 tablet by mouth daily on Monday, Wednesday, and Friday in evening.   Yes Historical Provider, MD  guaiFENesin (MUCINEX) 600 MG 12 hr tablet Take 600 mg by mouth every evening.    Yes Historical Provider, MD  levothyroxine (SYNTHROID, LEVOTHROID) 25 MCG tablet 37.5-50 tablets See admin instructions. Take 37.5 mcg by mouth daily Monday through Friday, and take 50 mcg by mouth daily on Saturday and Sunday. 12/31/15  Yes Historical Provider, MD  Multiple Vitamins-Minerals (CENTRUM SILVER PO) Take 1 tablet by mouth daily.   Yes Historical Provider, MD  Multiple Vitamins-Minerals (OCUVITE PRESERVISION PO) Take 1 tablet by mouth 2 (two) times daily.   Yes Historical Provider, MD  naproxen sodium (ANAPROX) 220 MG tablet Take 220 mg by mouth 2 (two) times daily with a meal.   Yes Historical Provider, MD  sitaGLIPtan-metformin (JANUMET) 50-500 MG per tablet Take 1 tablet by mouth 2 (two) times daily with a meal.   Yes Historical Provider, MD  HYDROcodone-acetaminophen (NORCO/VICODIN) 5-325 MG tablet Take 1 tablet by mouth every 4 (four) hours as needed for moderate pain. Patient not taking: Reported on 01/30/2017 12/04/16   Jonetta Osgood, MD  loratadine (CLARITIN) 10 MG tablet Take 10 mg by mouth daily as needed for allergies. Reported on 01/03/2016    Historical Provider, MD    Family History Family History  Problem Relation Age of Onset  . Stroke Mother   . Hypertension Mother   . Heart disease Father   . Heart attack Father   . Cancer Brother   . Hypertension Brother     Social History Social History  Substance  Use Topics  . Smoking status: Former Smoker    Packs/day: 1.00    Years: 63.00    Types: Cigarettes    Quit date: 04/09/2004  . Smokeless tobacco: Former Systems developer     Comment: Quit smoking in 2005  . Alcohol use No     Allergies   Iohexol and Iodine   Review of Systems Review of Systems  Constitutional: Negative for fever.  HENT: Negative for congestion, dental problem, rhinorrhea and sinus pressure.   Eyes: Negative for photophobia, discharge, redness and visual disturbance.  Respiratory: Negative for shortness of breath.   Cardiovascular: Negative for chest pain.  Gastrointestinal: Negative for nausea and vomiting.  Musculoskeletal: Positive for gait problem. Negative for neck pain and neck stiffness.  Skin: Negative for rash.  Neurological: Positive for facial asymmetry and speech difficulty. Negative for syncope, weakness, light-headedness, numbness and headaches.  Psychiatric/Behavioral:  Negative for confusion.     Physical Exam Updated Vital Signs BP (!) 153/72   Pulse 88   Temp 97.9 F (36.6 C)   Resp (!) 28   Ht 5\' 6"  (1.676 m)   Wt 68 kg   SpO2 94%   BMI 24.21 kg/m   Physical Exam  Constitutional: He is oriented to person, place, and time. He appears well-developed and well-nourished.  HENT:  Head: Normocephalic and atraumatic.  Right Ear: Tympanic membrane, external ear and ear canal normal.  Left Ear: Tympanic membrane, external ear and ear canal normal.  Nose: Nose normal.  Mouth/Throat: Uvula is midline, oropharynx is clear and moist and mucous membranes are normal.  Eyes: Conjunctivae, EOM and lids are normal. Pupils are equal, round, and reactive to light.  Neck: Normal range of motion. Neck supple. Carotid bruit is not present.  Cardiovascular: Normal rate and regular rhythm.   Pulmonary/Chest: Effort normal and breath sounds normal.  Abdominal: Soft. There is no tenderness.  Musculoskeletal: Normal range of motion.       Cervical back: He exhibits  normal range of motion, no tenderness and no bony tenderness.  Neurological: He is alert and oriented to person, place, and time. He has normal strength and normal reflexes. He displays tremor (L upper extremity). No cranial nerve deficit or sensory deficit. He exhibits normal muscle tone. He displays a negative Romberg sign. GCS eye subscore is 4. GCS verbal subscore is 5. GCS motor subscore is 6.  Skin: Skin is warm and dry.  Psychiatric: He has a normal mood and affect.  Nursing note and vitals reviewed.    ED Treatments / Results  Labs (all labs ordered are listed, but only abnormal results are displayed) Labs Reviewed  CBC - Abnormal; Notable for the following:       Result Value   MCH 34.2 (*)    All other components within normal limits  COMPREHENSIVE METABOLIC PANEL - Abnormal; Notable for the following:    Sodium 132 (*)    Chloride 97 (*)    Glucose, Bld 123 (*)    Total Protein 6.3 (*)    ALT 14 (*)    GFR calc non Af Amer 57 (*)    All other components within normal limits  CBG MONITORING, ED - Abnormal; Notable for the following:    Glucose-Capillary 123 (*)    All other components within normal limits  ETHANOL  PROTIME-INR  APTT  DIFFERENTIAL  RAPID URINE DRUG SCREEN, HOSP PERFORMED  URINALYSIS, ROUTINE W REFLEX MICROSCOPIC  I-STAT TROPOININ, ED    ED ECG REPORT   Date: 01/30/2017  Rate: 83  Rhythm: normal sinus rhythm  QRS Axis: normal  Intervals: normal  ST/T Wave abnormalities: normal  Conduction Disutrbances:none  Narrative Interpretation: septal q-waves unchanged  Old EKG Reviewed: changes noted, slower  I have personally reviewed the EKG tracing and agree with the computerized printout as noted.  Radiology Ct Head Wo Contrast  Result Date: 01/30/2017 CLINICAL DATA:  Expressive aphasia this morning EXAM: CT HEAD WITHOUT CONTRAST TECHNIQUE: Contiguous axial images were obtained from the base of the skull through the vertex without intravenous  contrast. COMPARISON:  CT scan 11/24/2014 FINDINGS: Brain: No intracranial hemorrhage, mass effect or midline shift. Stable cerebral atrophy. No definite acute cortical infarction. Stable old infarct and encephalomalacia in left parietal lobe high left occipital lobe. Vascular: Atherosclerotic calcifications of carotid siphon again noted. Skull: No skull fracture. Sinuses/Orbits: Mild mucosal thickening with partial opacification bilateral  ethmoid air cells. Other: None IMPRESSION: No acute intracranial abnormality. Stable cerebral atrophy. Stable old infarct with encephalomalacia in left parietal lobe and left occipital lobe. No definite acute cortical infarction. Electronically Signed   By: Lahoma Crocker M.D.   On: 01/30/2017 13:56    Procedures Procedures (including critical care time)  Medications Ordered in ED Medications - No data to display   Initial Impression / Assessment and Plan / ED Course  I have reviewed the triage vital signs and the nursing notes.  Pertinent labs & imaging results that were available during my care of the patient were reviewed by me and considered in my medical decision making (see chart for details).     Patient seen and examined. Work-up initiated. Medications ordered.   Vital signs reviewed and are as follows: BP (!) 153/72   Pulse 88   Temp 97.9 F (36.6 C)   Resp (!) 28   Ht 5\' 6"  (1.676 m)   Wt 68 kg   SpO2 94%   BMI 24.21 kg/m   Discussed with Dr. Sherry Ruffing who has seen. I spoke with Dr. Shon Hale regarding code stroke given residual tremor. Advises no Code stroke.   Pt and family updated. Will admit to hospitalist service.   Final Clinical Impressions(s) / ED Diagnoses   Final diagnoses:  Other specified transient cerebral ischemias   Pending eval for TIA/CVA.   New Prescriptions Current Discharge Medication List       Carlisle Cater, PA-C 01/30/17 [place], MD 02/03/17 1012

## 2017-01-30 NOTE — ED Notes (Addendum)
Attempted to locate family for MRI due to patient does not know if he has a pacemaker or any other type of metal in his body. Unable to locate family and wife not answering cell phone

## 2017-01-30 NOTE — ED Notes (Signed)
Attempted report x 2 

## 2017-01-30 NOTE — H&P (Signed)
History and Physical    Dylan Knight TLX:[id number] DOB: May 07, 1924 DOA: 01/30/2017  PCP: Maximino Greenland, MD   Patient coming from: from home  Chief Complaint: slurred speech, confusion  HPI: Dylan Knight is a 83 y.o. male with medical history significant for prior stroke in 2005, diabetes mellitus, hypertension, hyperlipidemia, BPH, hypothyroidism, peripheral vascular disease-status post left internal carotid artery stenting and status post right carotid endarterectomy, macular degeneration hold presented to the emergency department via EMS after he developed an acute of confusion and he'll be earlier this morning.  Patient's wife reported that he woke up this morning around 8:30 AM in his usual state of health, had breakfast and a cup of coffee. Around 10 AM his wife noted that during the conversation and history of having difficulties finding the words and seemed to be confused, not understanding what she was saying. She also noted that the left side of the patient's face was somewhat drooping and he had the Tammy was due to taking movements in the room moving his left hand. She called EMS and paramedics assisted him to the bathroom and he seemed to be having no difficulties with ambulating. He denied, HA, dizziness, visual changes, chest pain palpitations.  ED Course: In the emergency department his vital signs were stable except he was slightly tachypneic with respirations of 28 and blood pressure was suboptimally controlled on arrival-167/86 mmHg.  Patient underwent head CT that was negative for acute abnormality, notable for a large focal area of encephalomalacia in the left parietal lobe consistent with remote infarction and changes c/w small vessel ischemic disease  Blood work demonstrated mild hyponatremia with sodium 132, troponin was normal 0.00, CBC unremarkable with normal white blood cells count and hemoglobin 15.6 g/dL EKG showed sinus rhythm with QS in leads V1-V2, which were  not new   Review of Systems: As per HPI otherwise 10 point review of systems negative.   Ambulatory Status: Independent  Past Medical History:  Diagnosis Date  . Arthritis    left hip  . BPH (benign prostatic hyperplasia)   . Cancer [facility])    Skin cancers removed  . Carotid artery occlusion   . COPD (chronic obstructive pulmonary disease) ([place])   . Diabetes mellitus 2011   Border line  . Diabetes mellitus without complication ([place])   . Hyperlipidemia   . Hypertension   . Hypothyroidism   . Macular degeneration   . Peripheral arterial disease ([place]) 2002  . Peripheral vascular disease ([place])   . Peripheral vascular disease (HCC)    PAD  . Stroke ([place]) 2005  . Stroke [facility])    2005 - Dr Michael Boston - [place]    Past Surgical History:  Procedure Laterality Date  . ABDOMINAL AORTIC ANEURYSM REPAIR  2003   Aorto-left femoral-right iliac BPG by Dr. Scot Dock  . Aortic aneursym surgery     2003- Dr. Scot Dock  . BACK SURGERY     1960  . CAROTID ENDARTERECTOMY  Jan. 25,2007   Right CEA  . Carotid stenosis surgery on right     2007  . CARPAL TUNNEL RELEASE Left 06/02/2016   Procedure: LEFT CARPAL TUNNEL RELEASE;  Surgeon: Daryll Brod, MD;  Location: [facility];  Service: Orthopedics;  Laterality: Left;  . EYE SURGERY     Catarct surgery and lens implant - bilateral  . PR VEIN BYPASS GRAFT,AORTO-FEM-POP  2005   Revision Right Fem-pop  . PR VEIN BYPASS GRAFT,AORTO-FEM-POP  2002  Right Fem-pop  . RADIOLOGY WITH ANESTHESIA N/A 12/02/2016   Procedure: MRI LUMBAR SPINE WITHOUT;  Surgeon: Medication Radiologist, MD;  Location: [facility];  Service: Radiology;  Laterality: N/A;  . Right leg bypass     2002 -  and amputation of right fifth toe  . SPINE SURGERY  1960   lumbar spine  . [place], 2007   cervical spine  . TOE AMPUTATION  08-2001   right 5th toe amputation    Social History   Social History  . Marital status: Married    Spouse name: N/A   . Number of children: N/A  . Years of education: N/A   Occupational History  . Not on file.   Social History Main Topics  . Smoking status: Former Smoker    Packs/day: 1.00    Years: 63.00    Types: Cigarettes    Quit date: 04/09/2004  . Smokeless tobacco: Former Systems developer     Comment: Quit smoking in 2005  . Alcohol use No  . Drug use: No  . Sexual activity: Not on file   Other Topics Concern  . Not on file   Social History Narrative   ** Merged History Encounter **        Allergies  Allergen Reactions  . Iohexol Hives and Other (See Comments)    Went away with benadryl   . Iodine Rash    Family History  Problem Relation Age of Onset  . Stroke Mother   . Hypertension Mother   . Heart disease Father   . Heart attack Father   . Cancer Brother   . Hypertension Brother     Prior to Admission medications   Medication Sig Start Date End Date Taking? Authorizing Provider  acetaminophen (TYLENOL) 325 MG tablet Take 650 mg by mouth every 6 (six) hours as needed for mild pain.    Yes Historical Provider, MD  albuterol (PROVENTIL HFA;VENTOLIN HFA) 108 (90 BASE) MCG/ACT inhaler Inhale 2 puffs into the lungs every 6 (six) hours as needed for wheezing or shortness of breath. 07/16/15  Yes Collene Gobble, MD  atenolol-chlorthalidone (TENORETIC) 50-25 MG per tablet Take 0.5 tablets by mouth daily.  04/24/13  Yes Historical Provider, MD  clopidogrel (PLAVIX) 75 MG tablet Take 75 mg by mouth daily.   Yes Historical Provider, MD  ezetimibe-simvastatin (VYTORIN) 10-40 MG per tablet Take 1 tablet by mouth See admin instructions. Take 1 tablet by mouth daily on Monday, Wednesday, and Friday in evening.   Yes Historical Provider, MD  guaiFENesin (MUCINEX) 600 MG 12 hr tablet Take 600 mg by mouth every evening.    Yes Historical Provider, MD  levothyroxine (SYNTHROID, LEVOTHROID) 25 MCG tablet 37.5-50 tablets See admin instructions. Take 37.5 mcg by mouth daily Monday through Friday, and take 50  mcg by mouth daily on Saturday and Sunday. 12/31/15  Yes Historical Provider, MD  Multiple Vitamins-Minerals (CENTRUM SILVER PO) Take 1 tablet by mouth daily.   Yes Historical Provider, MD  Multiple Vitamins-Minerals (OCUVITE PRESERVISION PO) Take 1 tablet by mouth 2 (two) times daily.   Yes Historical Provider, MD  naproxen sodium (ANAPROX) 220 MG tablet Take 220 mg by mouth 2 (two) times daily with a meal.   Yes Historical Provider, MD  sitaGLIPtan-metformin (JANUMET) 50-500 MG per tablet Take 1 tablet by mouth 2 (two) times daily with a meal.   Yes Historical Provider, MD  HYDROcodone-acetaminophen (NORCO/VICODIN) 5-325 MG tablet Take 1 tablet by mouth every 4 (  four) hours as needed for moderate pain. Patient not taking: Reported on 01/30/2017 12/04/16   Jonetta Osgood, MD  loratadine (CLARITIN) 10 MG tablet Take 10 mg by mouth daily as needed for allergies. Reported on 01/03/2016    Historical Provider, MD    Physical Exam: Vitals:   01/30/17 1446 01/30/17 1500 01/30/17 1545 01/30/17 1625  BP: (!) 149/86 (!) 153/72 (!) 109/91 (!) 163/90  Pulse: 98 88 96 98  Resp: (!) 22 (!) 28 (!) 23 (!) 23  Temp:      TempSrc:      SpO2: 93% 94% 93% 94%  Weight:      Height:         General: Appears calm and comfortable Eyes: PERRLA, EOMI, normal lids, iris ENT:  grossly normal hearing, lips & tongue, mucous membranes moist and intact Neck: no lymphoadenopathy, masses or thyromegaly Cardiovascular: RRR, no m/r/g. No JVD, carotid bruits. No LE edema.  Respiratory: bilateral no wheezes, rales, rhonchi or cracles. Normal respiratory effort. No accessory muscle use observed Abdomen: soft, non-tender, non-distended, no organomegaly or masses appreciated. BS present in all quadrants Skin: no rash, ulcers or induration seen on limited exam Musculoskeletal: grossly normal tone BUE/BLE, good ROM, no bony abnormality or joint deformities observed, RLE and RUE strength is slightly reduced, no drift of the  upper extremities, no facial droop, small amplitude tremor of the right aarm Psychiatric: grossly normal mood and affect, speech slurred with difficulty understanding and following commands,appropriate, alert and oriented x3 Neurologic: CN II-XII grossly intact, moves all extremities in coordinated fashion, sensation intact  Labs on Admission: I have personally reviewed following labs and imaging studies  CBC, BMP  GFR: Estimated Creatinine Clearance: 39.5 mL/min (by C-G formula based on SCr of 1.1 mg/dL).   Creatinine Clearance: Estimated Creatinine Clearance: 39.5 mL/min (by C-G formula based on SCr of 1.1 mg/dL).   Radiological Exams on Admission: Ct Head Wo Contrast  Result Date: 01/30/2017 CLINICAL DATA:  Expressive aphasia this morning EXAM: CT HEAD WITHOUT CONTRAST TECHNIQUE: Contiguous axial images were obtained from the base of the skull through the vertex without intravenous contrast. COMPARISON:  CT scan 11/24/2014 FINDINGS: Brain: No intracranial hemorrhage, mass effect or midline shift. Stable cerebral atrophy. No definite acute cortical infarction. Stable old infarct and encephalomalacia in left parietal lobe high left occipital lobe. Vascular: Atherosclerotic calcifications of carotid siphon again noted. Skull: No skull fracture. Sinuses/Orbits: Mild mucosal thickening with partial opacification bilateral ethmoid air cells. Other: None IMPRESSION: No acute intracranial abnormality. Stable cerebral atrophy. Stable old infarct with encephalomalacia in left parietal lobe and left occipital lobe. No definite acute cortical infarction. Electronically Signed   By: Lahoma Crocker M.D.   On: 01/30/2017 13:56    EKG: Independently reviewed - sinus rhythm, no new changes observed  Assessment/Plan Principal Problem:   TIA (transient ischemic attack) Active Problems:   Peripheral vascular disease (HCC)   COPD (chronic obstructive pulmonary disease) (HCC)   Diabetes mellitus type 2,  controlled ([place])   CAD (coronary artery disease)   Hypothyroidism   Acute ischemic stroke ([place])     TIA vs acute ischemic stroke in patient with previous history of stroke and mild residual right-sided weakness - patient was seen by neurology During my assessment he has been having recurrent expressive aphasia lasting for 1-2 minutes which he had garbled speech and unable to understand what was asked of him Brain MRI without contrast, TTE, fasting lipid profile, hemoglobin A1c and coarotid Dopplers are pending  Continue Plavix that patient was taking for secondary stroke prophylaxis and PVD  DM type II - most recent HgbA1C is 6.7% on 12/01/16 Hold oral hypoglycemics while hospitalized Continue diabetic diet, monitor FSBS QACHS, add sliding scale insulin  CAD - currently stable, has no c/o chest pain, SOB, palpitations  Hyperlipidemia Continue Vytorin 10-40 mg qd, recheck fasting lipids and adjust the dose as needed for goal LDL less than 70  COPD - stable maintain O2 saturations at room air greater than 92% Continue home inhalers and the monitor for signs of exacerbation  Hypothyroidism Continue levothyroxine home doses  DVT prophylaxis: Heparin Code Status: DNR Family Communication: at bedside Disposition Plan: telemetry Consults called: neuro by EDP Admission status:  observation    York Grice, PA-C Pager: [phone] [facility]  If 7PM-7AM, please contact night-coverage [email or web address] Password [facility]  01/30/2017, 4:46 PM

## 2017-01-30 NOTE — ED Notes (Addendum)
Delay in lab draw,  Pt care performed at this time.

## 2017-01-30 NOTE — ED Triage Notes (Signed)
Received pt from home with c/o was up till 1 am watching basketball game last night. Pt woke up late today at 10 am, while eating breakfast with wife, pt speech was garbled. At 1030 symptoms resolved. Pt reports that he feels he stills has trouble getting words out. Pt able to speak in full complete sentences at this time.

## 2017-01-30 NOTE — ED Notes (Signed)
Patient going MRI

## 2017-01-30 NOTE — ED Notes (Signed)
Patient transported to MRI. Family at bedside answered questions for MRI via phone

## 2017-01-30 NOTE — ED Notes (Signed)
Neurology at bedside.

## 2017-01-30 NOTE — ED Notes (Addendum)
Attempted report x1. 

## 2017-01-31 ENCOUNTER — Observation Stay (HOSPITAL_COMMUNITY): Payer: Medicare Other

## 2017-01-31 ENCOUNTER — Inpatient Hospital Stay (HOSPITAL_BASED_OUTPATIENT_CLINIC_OR_DEPARTMENT_OTHER): Payer: Medicare Other

## 2017-01-31 ENCOUNTER — Inpatient Hospital Stay (HOSPITAL_COMMUNITY): Payer: Medicare Other

## 2017-01-31 DIAGNOSIS — I739 Peripheral vascular disease, unspecified: Secondary | ICD-10-CM | POA: Diagnosis not present

## 2017-01-31 DIAGNOSIS — E1121 Type 2 diabetes mellitus with diabetic nephropathy: Secondary | ICD-10-CM

## 2017-01-31 DIAGNOSIS — Z8673 Personal history of transient ischemic attack (TIA), and cerebral infarction without residual deficits: Secondary | ICD-10-CM

## 2017-01-31 DIAGNOSIS — I6523 Occlusion and stenosis of bilateral carotid arteries: Secondary | ICD-10-CM

## 2017-01-31 DIAGNOSIS — I6789 Other cerebrovascular disease: Secondary | ICD-10-CM | POA: Diagnosis not present

## 2017-01-31 DIAGNOSIS — J449 Chronic obstructive pulmonary disease, unspecified: Secondary | ICD-10-CM | POA: Diagnosis not present

## 2017-01-31 DIAGNOSIS — E039 Hypothyroidism, unspecified: Secondary | ICD-10-CM

## 2017-01-31 DIAGNOSIS — G451 Carotid artery syndrome (hemispheric): Secondary | ICD-10-CM

## 2017-01-31 DIAGNOSIS — I2510 Atherosclerotic heart disease of native coronary artery without angina pectoris: Secondary | ICD-10-CM

## 2017-01-31 DIAGNOSIS — E1159 Type 2 diabetes mellitus with other circulatory complications: Secondary | ICD-10-CM | POA: Diagnosis not present

## 2017-01-31 DIAGNOSIS — I639 Cerebral infarction, unspecified: Secondary | ICD-10-CM | POA: Diagnosis not present

## 2017-01-31 LAB — GLUCOSE, CAPILLARY
Glucose-Capillary: 161 mg/dL — ABNORMAL HIGH (ref 65–99)
Glucose-Capillary: 152 mg/dL — ABNORMAL HIGH (ref 65–99)
Glucose-Capillary: 102 mg/dL — ABNORMAL HIGH (ref 65–99)

## 2017-01-31 LAB — BASIC METABOLIC PANEL
Anion gap: 16 — ABNORMAL HIGH (ref 5–15)
BUN: 12 mg/dL (ref 6–20)
CO2: 23 mmol/L (ref 22–32)
Calcium: 9.8 mg/dL (ref 8.9–10.3)
Chloride: 93 mmol/L — ABNORMAL LOW (ref 101–111)
Creatinine, Ser: 1.08 mg/dL (ref 0.61–1.24)
GFR calc Af Amer: >60 mL/min (ref >60)
GFR calc non Af Amer: 58 mL/min — ABNORMAL LOW (ref >60)
Glucose, Bld: 158 mg/dL — ABNORMAL HIGH (ref 65–99)
Potassium: 3.7 mmol/L (ref 3.5–5.1)
Sodium: 132 mmol/L — ABNORMAL LOW (ref 135–145)

## 2017-01-31 LAB — LIPID PANEL
Cholesterol: 136 mg/dL (ref 0–200)
HDL: 53 mg/dL (ref >40)
LDL Cholesterol: 55 mg/dL (ref 0–99)
Total CHOL/HDL Ratio: 2.6 RATIO
Triglycerides: 140 mg/dL (ref <150)
VLDL: 28 mg/dL (ref 0–40)

## 2017-01-31 LAB — CBC
HCT: 46 % (ref 39.0–52.0)
Hemoglobin: 16.4 g/dL (ref 13.0–17.0)
MCH: 34.7 pg — ABNORMAL HIGH (ref 26.0–34.0)
MCHC: 35.7 g/dL (ref 30.0–36.0)
MCV: 97.3 fL (ref 78.0–100.0)
Platelets: 201 10*3/uL (ref 150–400)
RBC: 4.73 MIL/uL (ref 4.22–5.81)
RDW: 13.3 % (ref 11.5–15.5)
WBC: 11.1 10*3/uL — ABNORMAL HIGH (ref 4.0–10.5)

## 2017-01-31 LAB — ECHOCARDIOGRAM COMPLETE
Height: 66 in
Weight: 2400 oz

## 2017-01-31 LAB — TROPONIN I: Troponin I: <0.03 ng/mL (ref <0.03)

## 2017-01-31 MED ORDER — LORAZEPAM 2 MG/ML IJ SOLN
1.0000 mg | Freq: Once | INTRAMUSCULAR | Status: AC
  Administered 2017-01-31: 1 mg via INTRAVENOUS
  Filled 2017-01-31: qty 1

## 2017-01-31 NOTE — Progress Notes (Signed)
  Echocardiogram 2D Echocardiogram has been performed.  Dylan Knight 01/31/2017, 9:17 AM

## 2017-01-31 NOTE — Progress Notes (Signed)
PT Cancellation Note  Patient Details Name: Dylan Knight MRN: [id number] DOB: 1924/01/30   Cancelled Treatment:    Reason Eval/Treat Not Completed: Patient at procedure or test/unavailable   Will follow up later today as time allows;  Otherwise, will follow up for PT tomorrow;   Thank you,  Roney Marion, PT  Acute Rehabilitation Services Pager [phone] Office [place] 01/31/2017, 3:00 PM

## 2017-01-31 NOTE — Evaluation (Signed)
Speech Language Pathology Evaluation Patient Details Name: Dylan Knight MRN: [id number] DOB: [birth year] Today's Date: 01/31/2017 Time: 1050-1120 SLP Time Calculation (min) (ACUTE ONLY): 30 min  Problem List:  Patient Active Problem List   Diagnosis Date Noted  . TIA (transient ischemic attack) 01/30/2017  . Acute ischemic stroke ([place]) 01/30/2017  . Low back pain with right-sided sciatica 12/01/2016  . Diabetes mellitus type 2, controlled ([place]) 12/01/2016  . CAD (coronary artery disease) 12/01/2016  . Hypothyroidism 12/01/2016  . Low back pain 12/01/2016  . Back pain 11/02/2016  . Aftercare following surgery of the circulatory system, [place] 06/28/2013  . Cough 04/18/2013  . COPD (chronic obstructive pulmonary disease) ([place]) 11/22/2012  . Occlusion and stenosis of carotid artery without mention of cerebral infarction 06/29/2012  . Peripheral vascular disease ([place]) 12/30/2011   Past Medical History:  Past Medical History:  Diagnosis Date  . Arthritis    left hip  . BPH (benign prostatic hyperplasia)   . Cancer [facility])    Skin cancers removed  . Carotid artery occlusion   . COPD (chronic obstructive pulmonary disease) ([place])   . Diabetes mellitus 2011   Border line  . Diabetes mellitus without complication ([place])   . Hyperlipidemia   . Hypertension   . Hypothyroidism   . Macular degeneration   . Peripheral arterial disease ([place]) 2002  . Peripheral vascular disease ([place])   . Peripheral vascular disease ([facility])    PAD  . Stroke ([place]) 2005  . Stroke [facility])    2005 - Dr Michael [place] - [place]   Past Surgical History:  Past Surgical History:  Procedure Laterality Date  . ABDOMINAL AORTIC ANEURYSM REPAIR  2003   Aorto-left femoral-right iliac BPG by Dr. Scot Dock  . Aortic aneursym surgery     2003- Dr. Scot Dock  . BACK SURGERY     1960  . CAROTID ENDARTERECTOMY  Jan. 25,2007   Right CEA  . Carotid stenosis surgery on right     2007  . CARPAL TUNNEL RELEASE Left  06/02/2016   Procedure: LEFT CARPAL TUNNEL RELEASE;  Surgeon: Daryll Brod, MD;  Location: [place];  Service: Orthopedics;  Laterality: Left;  . EYE SURGERY     Catarct surgery and lens implant - bilateral  . PR VEIN BYPASS GRAFT,AORTO-FEM-POP  2005   Revision Right Fem-pop  . PR VEIN BYPASS GRAFT,AORTO-FEM-POP  2002   Right Fem-pop  . RADIOLOGY WITH ANESTHESIA N/A 12/02/2016   Procedure: MRI LUMBAR SPINE WITHOUT;  Surgeon: Medication Radiologist, MD;  Location: [place];  Service: Radiology;  Laterality: N/A;  . Right leg bypass     2002 -  and amputation of right fifth toe  . SPINE SURGERY  1960   lumbar spine  . [place], 2007   cervical spine  . TOE AMPUTATION  08-2001   right 5th toe amputation   HPI:  Patientis a 83 y.o.male with history of remote CVA resulting in residual right hand weakness, diabetes, peripheral vascular disease with carotid artery occlusion , abdominal aortic aneurysm repair -- presented with symptoms concerning for a transischemic attack. Patient had acute onset of expressive aphasia and left-sided facial droop. He also developed a tremor in his left upper extremity. Symptoms gradually resolved upon EMS arrival. Patient currently has residual left upper extremity tremor. MRI 01/31/17 with no acute findings, Chronic left MCA and left PCA infarcts. Passed nursing stroke swallow screen. No prior cognitive-linguistic evaluations in chart.    Assessment /  Plan / Recommendation Clinical Impression  Patient presents with expressive and receptive language deficits, with greatest impairment in verbal expression. Speech is noted with mostly appropriate syntax, though halting speech, frequent word-finding errors, neologisms noted. Patient is largely aware of errors. Pt follows 100% of single step commands, though requires repetition for 2 step commands. Complex, multistep commands with 60% accuracy. Paragraph level auditory comprehension 3/5. Wife is  present at bedside, reports patient had no language deficits following L CVA 13 years ago. Provided education about communication tips for auditory comprehension, verbal expression. Pt and his wife seem motivated to improve his speech, state they are "believers in therapy" given pt's work with PT after his previous stroke. State they would prefer OP follow-up vs HH. SLP will follow acutely.     SLP Assessment  SLP Recommendation/Assessment: Patient needs continued Speech Lanaguage Pathology Services SLP Visit Diagnosis: Aphasia (R47.01)    Follow Up Recommendations  Outpatient SLP;Home health SLP    Frequency and Duration min 1 x/week  2 weeks      SLP Evaluation Cognition  Overall Cognitive Status: Difficult to assess (given language deficits) Arousal/Alertness: Awake/alert Orientation Level: Oriented to person;Oriented to place;Disoriented to time;Disoriented to situation Attention: Focused;Sustained Focused Attention: Appears intact Sustained Attention: Appears intact Memory:  (difficult to assess given language deficits) Awareness: Appears intact       Comprehension  Auditory Comprehension Overall Auditory Comprehension: Impaired Yes/No Questions: Within Functional Limits Commands: Impaired One Step Basic Commands: 75-100% accurate Two Step Basic Commands: 75-100% accurate Multistep Basic Commands: 50-74% accurate Conversation: Simple Other Conversation Comments: requests frequent repetition, states he is frequently confused by what his wife, doctors are saying Interfering Components: Hearing EffectiveTechniques: Increased volume;Pausing;Repetition;Slowed speech;Visual/Gestural cues;Other (Comment);Stressing words;Extra processing time (Written cues) Reading Comprehension Reading Status: Not tested    Expression Expression Primary Mode of Expression: Verbal Verbal Expression Overall Verbal Expression: Impaired Initiation: No impairment Automatic Speech: Name;Social  Response;Counting;Day of week;Month of year Level of Generative/Spontaneous Verbalization: Sentence Repetition: No impairment Naming: Impairment Responsive: 76-100% accurate Confrontation: Impaired Convergent: 50-74% accurate Divergent: 0-24% accurate Other Naming Comments: Convergent naming 50% accurate Verbal Errors: Phonemic paraphasias;Neologisms;Aware of errors Pragmatics: No impairment Effective Techniques: Semantic cues;Written cues;Sentence completion;Open ended questions Non-Verbal Means of Communication: Not applicable Written Expression Dominant Hand: Right Written Expression: Not tested   Oral / Motor  Oral Motor/Sensory Function Overall Oral Motor/Sensory Function: Mild impairment Facial ROM: Reduced right Facial Symmetry: Abnormal symmetry right Facial Strength: Within Functional Limits Facial Sensation: Within Functional Limits Lingual ROM: Within Functional Limits Lingual Symmetry: Within Functional Limits Lingual Strength: Within Functional Limits Lingual Sensation: Within Functional Limits Velum: Within Functional Limits Mandible: Within Functional Limits Motor Speech Overall Motor Speech: Impaired Respiration: Within functional limits Phonation: Normal Resonance: Within functional limits Articulation: Impaired Level of Impairment: Sentence Intelligibility: Intelligible Motor Planning: Witnin functional limits Motor Speech Errors: Not applicable   [place], MS CF-SLP Speech-Language Pathologist [phone]  Aliene Altes 01/31/2017, 11:48 AM

## 2017-01-31 NOTE — Care Management Obs Status (Signed)
[place] NOTIFICATION   Patient Details  Name: TORAN MURCH MRN: [id number] Date of Birth: 1924-03-17   Medicare Observation Status Notification Given:  Yes    CrutchfieldAntony Haste, RN 01/31/2017, 4:52 PM

## 2017-01-31 NOTE — Care Management Obs Status (Signed)
[place] NOTIFICATION   Patient Details  Name: Dylan Knight MRN: [id number] Date of Birth: [birth year]   Medicare Observation Status Notification Given:  Yes    CrutchfieldAntony Haste, RN 01/31/2017, 4:40 PM

## 2017-01-31 NOTE — Progress Notes (Signed)
OT Cancellation Note  Patient Details Name: Dylan Knight MRN: [id number] DOB: [birth year]   Cancelled Treatment:    Reason Eval/Treat Not Completed: Patient at procedure or test/ unavailable. Will check back as schedule allows. Spoke with family who were present in room and told them to expect tomorrow. They shared that Pt is very [place].   [place] 01/31/2017, 2:59 PM  Hulda Humphrey OTR/L [phone]

## 2017-01-31 NOTE — Care Management CC44 (Signed)
Condition Code 44 Documentation Completed  Patient Details  Name: KALYAN BARABAS MRN: [id number] Date of Birth: [birth year]   Condition Code 44 given:  Yes Patient signature on Condition Code 44 notice:  Yes Documentation of 2 MD's agreement:  Yes Code 44 added to claim:  Yes    Hadlei Stitt, Antony Haste, RN 01/31/2017, 7:12 PM

## 2017-01-31 NOTE — Progress Notes (Signed)
[facility]                                                                              Patient Demographics  Dylan Knight, is a 83 y.o. male, DOB - 31-Aug-1924, WUJ:[id number]  Admit date - 01/30/2017   Admitting Physician Maren Reamer, MD  Outpatient Primary MD for the patient is Maximino [place], MD  Outpatient specialists:   LOS - 1  days    Chief Complaint  Patient presents with  . Aphasia       Brief summary   She is a 83 year old male with prior stroke in 2005, diabetes mellitus, hypertension, hyperlipidemia, BPH, hypothyroidism,peripheral vascular disease-status post left internal carotid artery stenting and status post right carotid endarterectomy, macular degeneration presented to the emergency department via EMS after he developed an acute of confusion on the morning of admission. Patient's wife also noted that patient was having difficulty finding words, seemed to be confused, facial drooping on the left side at the time. CT head was negative. Patient was admitted for further workup.    Assessment & Plan    Principal Problem: TIA with previous history of stroke and mild residual right-sided weakness - MRI of the brain negative for acute CVA - Follow 2-D echo, MRA of the brain and neck - LDL 55, continue acetamide, simvastatin - Continue Plavix, neurology following - Continue PT, OT, speech eval   Active problems DM type II - most recent HgbA1C is 6.7% on 12/01/16 - Hold oral hypoglycemics while hospitalized -Continue sliding scale insulin  CAD - currently stable,  no c/o chest pain, SOB  Hyperlipidemia Continue Vytorin 10-40 mg qd - LDL 55  COPD - stable maintain O2 saturations at room air greater than 92% Continue home inhalers  Hypothyroidism Continue levothyroxine  TSH 2.3  Code Status: DNR  DVT Prophylaxis:   heparin  Family Communication: Discussed in detail with the patient, all imaging results, lab results  explained to the patient   Disposition Plan: When workup is complete  Time Spent in minutes   25 minutes  Procedures:  MRI brain  Consultants:   Neurology   Antimicrobials:      Medications  Scheduled Meds: .  stroke: mapping our early stages of recovery book   Does not apply Once  . atenolol  25 mg Oral Daily   And  . chlorthalidone  12.5 mg Oral Daily  . clopidogrel  75 mg Oral Daily  . [START ON 02/01/2017] ezetimibe-simvastatin  1 tablet Oral Q M,W,F-1800  . guaiFENesin  600 mg Oral QPM  . heparin  5,000 Units Subcutaneous Q8H  . insulin aspart  0-5 Units Subcutaneous QHS  . insulin aspart  0-9 Units Subcutaneous TID WC  . [START ON 02/01/2017] levothyroxine  37.5 mcg Oral Once per day on Mon Tue Wed Thu Fri  . levothyroxine  50 mcg Oral Once per day on Sun Sat  . LORazepam  1 mg Intravenous Once  . sodium chloride flush  3 mL Intravenous Q12H   Continuous Infusions: PRN Meds:.acetaminophen **OR** acetaminophen, albuterol, hydrALAZINE, HYDROcodone-acetaminophen, polyethylene glycol   Antibiotics   Anti-infectives  None        Subjective:   Dylan Knight was seen and examined today. Patient denies dizziness, chest pain, shortness of breath, abdominal pain, N/V/D/C, new weakness, numbess, tingling. No acute events overnight.    Objective:   Vitals:   01/31/17 0400 01/31/17 0600 01/31/17 0735 01/31/17 1018  BP: (!) 166/96 123/79 (!) 179/77 117/68  Pulse: (!) 104 87 90 98  Resp:   16 20  Temp:  97.7 F (36.5 C)  97.7 F (36.5 C)  TempSrc:  Oral  Oral  SpO2: 99% 94% 96% 98%  Weight:      Height:        Intake/Output Summary (Last 24 hours) at 01/31/17 1125 Last data filed at 01/31/17 0255  Gross per 24 hour  Intake                0 ml  Output             1075 ml  Net            -1075 ml     Wt Readings from Last 3 Encounters:  01/30/17 68 kg (150 lb)  01/22/17 68 kg (150 lb)  01/06/17 69.4 kg (153 lb)     Exam  General: Alert and  oriented x 3, NAD  HEENT:  Neck: Supple, no JVD, no masses  Cardiovascular: S1 S2 auscultated, no rubs, murmurs or gallops. Regular rate and rhythm.  Respiratory: Clear to auscultation bilaterally, no wheezing, rales or rhonchi  Gastrointestinal: Soft, nontender, nondistended, + bowel sounds  Ext: no cyanosis clubbing or edema  Neuro: AAOx3, Cr N's II- XII. Strength 5/5 upper and lower extremities bilaterally  Skin: No rashes  Psych: Normal affect and demeanor, alert and oriented x3    Data Reviewed:  I have personally reviewed following labs and imaging studies  Micro Results No results found for this or any previous visit (from the past 240 hour(s)).  Radiology Reports Ct Head Wo Contrast  Result Date: 01/30/2017 CLINICAL DATA:  Expressive aphasia this morning EXAM: CT HEAD WITHOUT CONTRAST TECHNIQUE: Contiguous axial images were obtained from the base of the skull through the vertex without intravenous contrast. COMPARISON:  CT scan 11/24/2014 FINDINGS: Brain: No intracranial hemorrhage, mass effect or midline shift. Stable cerebral atrophy. No definite acute cortical infarction. Stable old infarct and encephalomalacia in left parietal lobe high left occipital lobe. Vascular: Atherosclerotic calcifications of carotid siphon again noted. Skull: No skull fracture. Sinuses/Orbits: Mild mucosal thickening with partial opacification bilateral ethmoid air cells. Other: None IMPRESSION: No acute intracranial abnormality. Stable cerebral atrophy. Stable old infarct with encephalomalacia in left parietal lobe and left occipital lobe. No definite acute cortical infarction. Electronically Signed   By: Lahoma Crocker M.D.   On: 01/30/2017 13:56   Mr Brain Wo Contrast  Result Date: 01/30/2017 CLINICAL DATA:  83 year old male with acute onset expressive aphasia and left facial droop. Confusion. Prior stroke. EXAM: MRI HEAD WITHOUT CONTRAST TECHNIQUE: Multiplanar, multiecho pulse sequences of the  brain and surrounding structures were obtained without intravenous contrast. COMPARISON:  Head CT without contrast 1347 hours today. Brain MRI 11/27/2014 FINDINGS: The examination had to be discontinued prior to completion due to patient confusion and agitation. Axial T1 and coronal T2 weighted imaging was not obtained. Brain: Chronic encephalomalacia in the left MCA territory -predominately the posterior MCA territory -as well as the superior left PCA territory. Encephalomalacia and gliosis appears stable since 2016. No definite associated chronic blood products. No restricted diffusion  to suggest acute infarction. No midline shift, mass effect, evidence of mass lesion, ventriculomegaly, extra-axial collection or acute intracranial hemorrhage. Cervicomedullary junction and pituitary are within normal limits. Small chronic lacunar infarct in the left caudate nucleus also noted. No definite acute signal abnormality identified. 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 Skull and upper cervical spine: Negative. Normal bone marrow signal. Sinuses/Orbits: Stable and negative orbits soft tissues. Stable mild paranasal sinus mucosal thickening. Other: Mastoids are clear. Visible internal auditory structures appear normal. Negative scalp soft tissues. IMPRESSION: 1.  No acute intracranial abnormality. 2. Stable noncontrast MRI appearance the brain since 2016. Chronic left MCA and left PCA infarcts. 3. The examination had to be discontinued prior to completion due to patient confusion and agitation. Electronically Signed   By: Genevie Ann M.D.   On: 01/30/2017 19:05    Lab Data:  CBC:  Recent Labs Lab 01/30/17 1226 01/31/17 0427  WBC 7.1 11.1*  NEUTROABS 5.1  --   HGB 15.6 16.4  HCT 44.9 46.0  MCV 98.5 97.3  PLT 182 323   Basic Metabolic Panel:  Recent Labs Lab 01/30/17 1226 01/30/17 2038  01/31/17 0427  NA 132*  --  132*  K 4.1  --  3.7  CL 97*  --  93*  CO2 26  --  23  GLUCOSE 123*  --  158*  BUN 13  --  12  CREATININE 1.10 1.12 1.08  CALCIUM 9.6  --  9.8   GFR: Estimated Creatinine Clearance: 40.2 mL/min (by C-G formula based on SCr of 1.08 mg/dL). Liver Function Tests:  Recent Labs Lab 01/30/17 1226  AST 32  ALT 14*  ALKPHOS 40  BILITOT 1.2  PROT 6.3*  ALBUMIN 4.2   No results for input(s): LIPASE, AMYLASE in the last 168 hours. No results for input(s): AMMONIA in the last 168 hours. Coagulation Profile:  Recent Labs Lab 01/30/17 1226  INR 1.01   Cardiac Enzymes:  Recent Labs Lab 01/30/17 2038 01/30/17 2315 01/31/17 0427  TROPONINI <0.03 <0.03 <0.03   BNP (last 3 results) No results for input(s): PROBNP in the last 8760 hours. HbA1C: No results for input(s): HGBA1C in the last 72 hours. CBG:  Recent Labs Lab 01/30/17 1225 01/30/17 2107 01/31/17 0622  GLUCAP 123* 166* 161*   Lipid Profile:  Recent Labs  01/31/17 0427  CHOL 136  HDL 53  LDLCALC 55  TRIG 140  CHOLHDL 2.6   Thyroid Function Tests:  Recent Labs  01/30/17 2038  TSH 2.390   Anemia Panel: No results for input(s): VITAMINB12, FOLATE, FERRITIN, TIBC, IRON, RETICCTPCT in the last 72 hours. Urine analysis:    Component Value Date/Time   COLORURINE YELLOW 01/30/2017 1315   APPEARANCEUR CLEAR 01/30/2017 1315   APPEARANCEUR Clear 11/24/2014 1359   LABSPEC 1.010 01/30/2017 1315   LABSPEC 1.008 11/24/2014 1359   PHURINE 7.5 01/30/2017 1315   GLUCOSEU NEGATIVE 01/30/2017 1315   GLUCOSEU Negative 11/24/2014 1359   HGBUR NEGATIVE 01/30/2017 1315   BILIRUBINUR NEGATIVE 01/30/2017 1315   BILIRUBINUR Negative 11/24/2014 [place] 01/30/2017 1315   PROTEINUR NEGATIVE 01/30/2017 1315   NITRITE NEGATIVE 01/30/2017 1315   LEUKOCYTESUR NEGATIVE 01/30/2017 1315   LEUKOCYTESUR Negative 11/24/2014 [place] M.D. [facility] 01/31/2017, 11:25 AM  Pager: [phone] Between 7am to 7pm - call Pager - [phone]  After 7pm go to [email or web address] -  password [email or web address]  Call night coverage person covering after 7pm

## 2017-01-31 NOTE — Progress Notes (Signed)
STROKE TEAM PROGRESS NOTE   HISTORY OF PRESENT ILLNESS (per record) This is a 83 year old man who presented to [facility] emergency department following an episode of speech difficulty earlier today. History is obtained largely from the patient's wife as the patient is aphasic and has difficulty expressing himself.   His wife reports that they woke up about 8:30 this morning and the patient was in his usual state of health. They had breakfast around 10 AM at which time she noticed that he was having difficulty getting his words out. She also noticed that he had some left facial droop as well as a tremor involving the left hand. He was brought to the [facility] emergency department for further evaluation. He and his wife report that symptoms have improved somewhat. He no longer has a left facial droop. However, he continues to have tremor of his left hand as well as ongoing difficulty expressing himself, which his wife says tends to fluctuate a little bit. He has never had these symptoms before. He denies any symptoms in his arms. He denies any sensory changes. He has not had any vision loss, blurry vision, or double vision. He denies any headache. He has had a previous stroke which left him with some mild residual weakness and difficulty using his right hand. Neurology consultation is requested for further recommendations.  Of note, he takes Plavix due to underlying peripheral vascular disease. He also has had previous carotid disease for which she had a carotid endarterectomy. His wife reports that every 6 months he is seen by his vascular surgeon and they have been told that his carotids have been clear.   SUBJECTIVE (INTERVAL HISTORY) His wife and daughter are at the bedside. Pt is sitting in chair for lunch. Pt had episode of difficulty getting words out and seems resolved on exam. However, wife and daughter still thinking his speech is not at baseline. During the episode, he also had left facial  droop and left hand tremor (wife said not shaking jerking, just tremor), but now resolved. Pt had stroke in 2005 with right side weakness, needed PT/OT for 6 months but no residue.    OBJECTIVE Temp:  [97.7 F (36.5 C)-98.4 F (36.9 C)] 97.7 F (36.5 C) (03/25 1018) Pulse Rate:  [87-114] 98 (03/25 1018) Cardiac Rhythm: Normal sinus rhythm;Heart block (03/25 0700) Resp:  [15-28] 20 (03/25 1018) BP: (109-200)/(68-165) 117/68 (03/25 1018) SpO2:  [93 %-99 %] 98 % (03/25 1018)  CBC:   Recent Labs Lab 01/30/17 1226 01/31/17 0427  WBC 7.1 11.1*  NEUTROABS 5.1  --   HGB 15.6 16.4  HCT 44.9 46.0  MCV 98.5 97.3  PLT 182 563    Basic Metabolic Panel:   Recent Labs Lab 01/30/17 1226 01/30/17 2038 01/31/17 0427  NA 132*  --  132*  K 4.1  --  3.7  CL 97*  --  93*  CO2 26  --  23  GLUCOSE 123*  --  158*  BUN 13  --  12  CREATININE 1.10 1.12 1.08  CALCIUM 9.6  --  9.8    Lipid Panel:     Component Value Date/Time   CHOL 136 01/31/2017 0427   CHOL 145 11/25/2014 0526   TRIG 140 01/31/2017 0427   TRIG 377 (H) 11/25/2014 0526   HDL 53 01/31/2017 0427   HDL 42 11/25/2014 0526   CHOLHDL 2.6 01/31/2017 0427   VLDL 28 01/31/2017 0427   VLDL 75 (H) 11/25/2014 0526  LDLCALC 55 01/31/2017 0427   LDLCALC 28 11/25/2014 0526   HgbA1c:  Lab Results  Component Value Date   HGBA1C 6.7 (H) 12/01/2016   Urine Drug Screen:     Component Value Date/Time   LABOPIA NONE DETECTED 01/30/2017 1315   COCAINSCRNUR NONE DETECTED 01/30/2017 1315   LABBENZ NONE DETECTED 01/30/2017 1315   AMPHETMU NONE DETECTED 01/30/2017 1315   THCU NONE DETECTED 01/30/2017 1315   LABBARB NONE DETECTED 01/30/2017 1315      IMAGING I have personally reviewed the radiological images below and agree with the radiology interpretations.  Ct Head Wo Contrast 01/30/2017 No acute intracranial abnormality. Stable cerebral atrophy. Stable old infarct with encephalomalacia in left parietal lobe and left  occipital lobe. No definite acute cortical infarction.   Mr Brain Wo Contrast 01/30/2017 1.  No acute intracranial abnormality.  2. Stable noncontrast MRI appearance the brain since 2016. Chronic left MCA and left PCA infarcts.  3. The examination had to be discontinued prior to completion due to patient confusion and agitation.   Mr [facility] and neck Wo Contrast 01/31/2017 IMPRESSION: 1. Study is motion degraded despite repeated imaging attempts. 2. Head and neck MRA appear grossly stable since 2016. 3. Chronic left vertebral artery occlusion. Chronic poor flow signal in the cervical left ICA, but in 2016 this was felt to be related to artifact from an ICA stent.   TTE - Left ventricle: The cavity size was normal. Wall thickness was   increased in a pattern of moderate LVH. Systolic function was   normal. The estimated ejection fraction was in the range of 60%   to 65%. Doppler parameters are consistent with abnormal left   ventricular relaxation (grade 1 diastolic dysfunction). - Aortic valve: Valve area (VTI): 3.32 cm^2. Valve area (Vmax):   2.89 cm^2. Valve area (Vmean): 2.46 cm^2. - Atrial septum: No defect or patent foramen ovale was identified. - Technically difficult study.  EEG pending  CUS pending   PHYSICAL EXAM  Temp:  [97.7 F (36.5 C)-98.4 F (36.9 C)] 97.7 F (36.5 C) (03/25 1018) Pulse Rate:  [87-114] 98 (03/25 1018) Resp:  [15-23] 20 (03/25 1018) BP: (109-200)/(68-165) 117/68 (03/25 1018) SpO2:  [93 %-99 %] 98 % (03/25 1018)  General - Well nourished, well developed, in no apparent distress.  Ophthalmologic - Fundi not visualized due to noncooperation.  Cardiovascular - Regular rate and rhythm.  Mental Status -  Level of arousal and orientation to month, place, and person were intact, not orientated to year. Language including expression, naming, repetition, comprehension was assessed and found intact. Fund of Knowledge was assessed and was  impaired.  Cranial Nerves II - XII - II - Visual field intact OU. III, IV, VI - Extraocular movements intact. V - Facial sensation intact bilaterally. VII - Facial movement intact bilaterally. VIII - hard of hearing & vestibular intact bilaterally. X - Palate elevates symmetrically. XI - Chin turning & shoulder shrug intact bilaterally. XII - Tongue protrusion intact.  Motor Strength - The patient's strength was 4+/5 in all extremities and pronator drift was absent.  Bulk was normal and fasciculations were absent.   Motor Tone - Muscle tone was assessed at the neck and appendages and was normal.  Reflexes - The patient's reflexes were 1+ in all extremities and he had no pathological reflexes.  Sensory - Light touch, temperature/pinprick were assessed and were symmetrical.    Coordination - The patient had normal movements in the hands with no ataxia or  dysmetria.  Mild action tremor on the left hand, no resting tremor.  Gait and Station - deferred.   ASSESSMENT/PLAN [name] is a 83 y.o. male with history of a previous stroke with residual mild right hemiparesis, peripheral vascular disease, carotid artery disease s/p carotid endarterectomy, coronary artery disease, hypothyroidism, hypertension, hyperlipidemia, diabetes mellitus, and COPD, presenting with speech difficulties, left facial droop, and tremor of the left hand. He did not receive IV t-PA due to late presentation and improvement in deficits.  Possible TIA    Resultant  Family thinks speech still not normal with word finding difficulty  MRI - No acute intracranial abnormality, but remote left MCA infarct.  MRA  &N - motion degraded, no LVO, left VA occlusion  Carotid Doppler - pending  2D Echo - EF 60-65%. No cardiac source of emboli identified.  EEG - pending to rule out seizure  LDL - 55  HgbA1c pending  VTE prophylaxis - subcutaneous heparin Diet heart healthy/carb modified Room service  appropriate? Yes; Fluid consistency: Thin  clopidogrel 75 mg daily prior to admission, now on clopidogrel 75 mg daily. Wife reluctant to consider DAPT at this time.   Patient counseled to be compliant with his antithrombotic medications  Ongoing aggressive stroke risk factor management  Therapy recommendations: pending  Disposition: Pending  Hx of stroke  2005 left MCA infarct - seen in [place] with right side weakness - recovered well without redisue  11/2014 - transient right INO - considered TIA  Hx of carotid stenosis  s/p right CEA  s/p left CAS  Follows with vascular surgery   CUS pending  Hypertension  Stable  Permissive hypertension (OK if < 220/120) but gradually normalize in 5-7 days  Long-term BP goal normotensive  Hyperlipidemia  Home meds:  Vytorin 10/40 mg Q M-W-F resumed in hospital  LDL 55, goal < 70  Continue statin at discharge  Diabetes  HbA1c pending, goal < 7.0  On SSI  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 After hours, contact General Neurology

## 2017-02-01 ENCOUNTER — Observation Stay (HOSPITAL_BASED_OUTPATIENT_CLINIC_OR_DEPARTMENT_OTHER)
Admit: 2017-02-01 | Discharge: 2017-02-01 | Disposition: A | Discharge status: Home / Self Care | Payer: Medicare Other | Attending: Neurology | Admitting: Neurology

## 2017-02-01 ENCOUNTER — Observation Stay (HOSPITAL_BASED_OUTPATIENT_CLINIC_OR_DEPARTMENT_OTHER): Payer: Medicare Other

## 2017-02-01 DIAGNOSIS — I6523 Occlusion and stenosis of bilateral carotid arteries: Secondary | ICD-10-CM

## 2017-02-01 DIAGNOSIS — R479 Unspecified speech disturbances: Secondary | ICD-10-CM | POA: Insufficient documentation

## 2017-02-01 DIAGNOSIS — G459 Transient cerebral ischemic attack, unspecified: Secondary | ICD-10-CM

## 2017-02-01 DIAGNOSIS — J449 Chronic obstructive pulmonary disease, unspecified: Secondary | ICD-10-CM | POA: Diagnosis not present

## 2017-02-01 DIAGNOSIS — E1159 Type 2 diabetes mellitus with other circulatory complications: Secondary | ICD-10-CM

## 2017-02-01 DIAGNOSIS — I2510 Atherosclerotic heart disease of native coronary artery without angina pectoris: Secondary | ICD-10-CM | POA: Diagnosis not present

## 2017-02-01 DIAGNOSIS — G451 Carotid artery syndrome (hemispheric): Secondary | ICD-10-CM | POA: Diagnosis not present

## 2017-02-01 LAB — VAS US CAROTID
LEFT ECA DIAS: -6 cm/s
LEFT VERTEBRAL DIAS: -16 cm/s
Left CCA dist dias: 12 cm/s
Left CCA dist sys: 68 cm/s
Left CCA prox dias: 16 cm/s
Left CCA prox sys: 96 cm/s
Left ICA dist dias: -22 cm/s
Left ICA dist sys: -106 cm/s
Left ICA prox dias: -16 cm/s
Left ICA prox sys: -68 cm/s
RIGHT ECA DIAS: -6 cm/s
RIGHT VERTEBRAL DIAS: -14 cm/s
Right CCA prox dias: -11 cm/s
Right CCA prox sys: -71 cm/s
Right cca dist sys: -81 cm/s

## 2017-02-01 LAB — HEMOGLOBIN A1C
Hgb A1c MFr Bld: 6.5 % — ABNORMAL HIGH (ref 4.8–5.6)
Mean Plasma Glucose: 140 mg/dL

## 2017-02-01 LAB — GLUCOSE, CAPILLARY
Glucose-Capillary: 141 mg/dL — ABNORMAL HIGH (ref 65–99)
Glucose-Capillary: 163 mg/dL — ABNORMAL HIGH (ref 65–99)
Glucose-Capillary: 135 mg/dL — ABNORMAL HIGH (ref 65–99)

## 2017-02-01 NOTE — Care Management Note (Signed)
Case Management Note  Patient Details  Name: RAMEZ ARRONA MRN: [id number] Date of Birth: Apr 21, 1924  Subjective/Objective:                    Action/Plan: Pt discharging home with orders for [facility] services. CM provided them a list of [place] agencies. Ms Melendrez selected [facility]. Santiago Glad with [facility] notified and accepted the referral.  Pt has transportation home.    Expected Discharge Date:  02/01/17               Expected Discharge Plan:  [place]  In-House Referral:     Discharge planning Services  CM Consult  Post Acute Care Choice:  Home Health Choice offered to:  Patient, Spouse  DME Arranged:    DME Agency:     HH Arranged:  PT, OT HH Agency:  [place]  Status of Service:  Completed, signed off  If discussed at [place] of Stay Meetings, dates discussed:    Additional Comments:  Pollie Friar, RN 02/01/2017, 4:19 PM

## 2017-02-01 NOTE — Evaluation (Signed)
Occupational Therapy Evaluation Patient Details Name: BIJON MINEER MRN: [id number] DOB: [birth year] Today's Date: 02/01/2017    History of Present Illness Pt is a 83 y.o. male who presented to the ED with speech difficulty, L facial droop, and tremor involving the L hand. CT revealed stable old infarct with encephalomalacia in the L parietal and L occipital lobe. MRI revealed to acute intracranial abnormality. PMH significant for arthritis, benign prostatic hyperplasia, cancer, carotid artery occlusion, COPD, diabetes mellitus, hyperlipidemia, hypertension, hypothyroidism, macular degeneration, peripheral arterial disease, peripheral vascular disease, and stroke in 2005.   Clinical Impression   PTA, pt was independent with a 4-wheeled RW with ADL and functional mobility. Pt currently requires min guard assist for toilet transfers and supervision for safety during standing grooming tasks and VC's for sequencing. He presents with tremor in L UE and decreased motor planning skills further impacting ability to complete ADL at PLOF. He would benefit from continued OT services while admitted to improve independence with ADL and functional mobility in preparation for D/C home with wife and home health OT services.     Follow Up Recommendations  Home health OT;Supervision/Assistance - 24 hour    Equipment Recommendations  None recommended by OT    Recommendations for Other Services       Precautions / Restrictions Precautions Precautions: Fall Restrictions Weight Bearing Restrictions: No      Mobility Bed Mobility               General bed mobility comments: OOB in chair on OT arrival  Transfers Overall transfer level: Needs assistance Equipment used: Rolling walker (2 wheeled) Transfers: Sit to/from Stand Sit to Stand: Min guard         General transfer comment: Min guard assist for safety. Required VC's for safe hand placement.    Balance Overall balance assessment:  Needs assistance Sitting-balance support: No upper extremity supported;Feet supported Sitting balance-Leahy Scale: Fair     Standing balance support: Bilateral upper extremity supported;During functional activity Standing balance-Leahy Scale: Poor Standing balance comment: Reliant on UE support during grooming tasks at sink.                           ADL either performed or assessed with clinical judgement   ADL Overall ADL's : Needs assistance/impaired Eating/Feeding: Set up;Sitting   Grooming: Min guard;Standing   Upper Body Bathing: Set up;Sitting   Lower Body Bathing: Supervison/ safety;Sit to/from stand   Upper Body Dressing : Set up;Sitting   Lower Body Dressing: Supervision/safety;Sit to/from stand   Toilet Transfer: Min guard;Ambulation;RW;Comfort height toilet   Toileting- Clothing Manipulation and Hygiene: Min guard;Sit to/from stand   Tub/ Shower Transfer: Walk-in shower;Min guard;Ambulation;Rolling walker;Shower seat   Functional mobility during ADLs: Min guard;Rolling walker General ADL Comments: Pt generally slow moving during ADL. Reports he feels weak but close to baseline with mobility. Noted decreased motor planning and sequencing skills with basic ADL.      Vision Baseline Vision/History: Macular Degeneration;Wears glasses Wears Glasses: At all times Patient Visual Report: No change from baseline Vision Assessment?: Vision impaired- to be further tested in functional context Additional Comments: Pt able to read and utilize vision during ADL tasks when wearing glasses but deficits in vision at baseline due to macular degeneration. Pt/family feel he is at baseline but will continue to assess.     Perception     Praxis Praxis Praxis tested?: Deficits Deficits: Initiation;Ideation    Pertinent Vitals/Pain Pain  Assessment: No/denies pain     Hand Dominance Right   Extremity/Trunk Assessment Upper Extremity Assessment Upper Extremity  Assessment: RUE deficits/detail;LUE deficits/detail RUE Deficits / Details: Weakness (grossly 4/5) and slow movement at baseline from previous CVA.  RUE Coordination: decreased fine motor;decreased gross motor (at baseline) LUE Deficits / Details: Tremor noted during ADL tasks. Pt reports that he is "in a nervous state." LUE Coordination: decreased fine motor   Lower Extremity Assessment Lower Extremity Assessment: Defer to PT evaluation       Communication Communication Communication: Expressive difficulties (Per family speech is slow)   Cognition Arousal/Alertness: Awake/alert Behavior During Therapy: WFL for tasks assessed/performed Overall Cognitive Status: Difficult to assess Area of Impairment: Orientation;Memory;Problem solving                 Orientation Level: Disoriented to;Time   Memory: Decreased short-term memory       Problem Solving: Slow processing;Difficulty sequencing General Comments: Pt with word finding difficulties making cognitive status difficult to assess. He reported that the year was 1985 and required verbal cues for sequencing during grooming tasks at sink.   General Comments       Exercises     Shoulder Instructions      Home Living Family/patient expects to be discharged to:: Private residence Living Arrangements: Spouse/significant other Available Help at Discharge: Family;Available 24 hours/day Type of Home: Apartment Home Access: Level entry     Home Layout: One level     Bathroom Shower/Tub: Occupational psychologist: Handicapped height Bathroom Accessibility: Yes   Home Equipment: Grab bars - toilet;Grab bars - tub/shower;Hand held shower head;Walker - 4 wheels;Cane - quad;Shower seat - built in          Prior Functioning/Environment Level of Independence: Independent with assistive device(s)        Comments: Pt's wife does all the driving. Pt performs basic ADL independently but slowly at home.         OT Problem List: Decreased strength;Decreased activity tolerance;Impaired balance (sitting and/or standing);Decreased safety awareness;Decreased knowledge of use of DME or AE;Decreased knowledge of precautions;Decreased coordination;Decreased cognition      OT Treatment/Interventions: Self-care/ADL training;Therapeutic exercise;DME and/or AE instruction;Therapeutic activities;Cognitive remediation/compensation;Patient/family education;Balance training    OT Goals(Current goals can be found in the care plan section) Acute Rehab OT Goals Patient Stated Goal: to go home OT Goal Formulation: With patient Time For Goal Achievement: 02/15/17 Potential to Achieve Goals: Good ADL Goals Pt Will Perform Grooming: with modified independence;standing (3 tasks) Pt Will Transfer to Toilet: with modified independence;ambulating (comfort height toilet) Pt Will Perform Toileting - Clothing Manipulation and hygiene: with modified independence;sit to/from stand Pt Will Perform Tub/Shower Transfer: Shower transfer;with supervision;rolling walker;ambulating;shower seat Additional ADL Goal #1: Pt will demonstrate improved motor planning skills to sequence oral care tasks independently.  OT Frequency: Min 2X/week   Barriers to D/C:            Co-evaluation              End of Session Equipment Utilized During Treatment: Gait belt;Rolling walker Nurse Communication: Mobility status  Activity Tolerance: Patient tolerated treatment well Patient left: in chair;with call bell/phone within reach;with family/visitor present;with chair alarm set  OT Visit Diagnosis: Unsteadiness on feet (R26.81)                Time: 5643-3295 OT Time Calculation (min): 48 min Charges:  OT General Charges $OT Visit: 1 Procedure OT Evaluation $OT Eval Moderate Complexity: 1 Procedure  OT Treatments $Self Care/Home Management : 23-37 mins G-Codes: OT G-codes **NOT FOR INPATIENT CLASS** Functional Assessment Tool Used:  AM-PAC 6 Clicks Daily Activity Functional Limitation: Self care Self Care Current Status (M2111): At least 20 percent but less than 40 percent impaired, limited or restricted Self Care Goal Status (B5208): At least 1 percent but less than 20 percent impaired, limited or restricted   Norman Herrlich, [place] OTR/L  Pager: [place] 02/01/2017, 9:44 AM

## 2017-02-01 NOTE — Progress Notes (Signed)
EEG completed; results pending.    

## 2017-02-01 NOTE — Progress Notes (Signed)
Discharge orders received.  Discharge instructions and follow-up appointments reviewed with the patient and his family.  VSS upon discharge.  IV removed and education complete.  All belongings sent with the patient.  Transported out via wheelchair. Kandyce Dieguez M, RN    

## 2017-02-01 NOTE — Progress Notes (Signed)
  Discussed case with PT who reports pt is now requiring min assist for functional mobility and demonstrating significant fall risk as compared to OT evaluation. Agree with PT that pt would benefit from SNF placement for continued rehabilitation in order to maximize return to PLOF. Updated D/C recommendation in flow sheets accordingly.          02/01/17 1747  OT Assessment/Plan  OT Plan Discharge plan needs to be updated  Follow Up Recommendations SNF   Norman Herrlich, MS OTR/L  Pager: [phone]

## 2017-02-01 NOTE — Discharge Summary (Signed)
Physician Discharge Summary   Patient ID: JAMERE STIDHAM MRN: [id number] DOB/AGE: 81/16/1925 81 y.o.  Admit date: 01/30/2017 Discharge date: 02/01/2017  Primary Care Physician:  Maximino Greenland, MD  Discharge Diagnoses:    . TIA (transient ischemic attack) . CAD (coronary artery disease) . COPD (chronic obstructive pulmonary disease) ([place]) . Hypothyroidism . Peripheral vascular disease [facility])   Consults:  Neurology, Dr Erlinda Hong   Recommendations for Outpatient Follow-up:  1. Home health PT, OT  2. Please repeat CBC/BMET at next visit    DIET: heart healthy     Allergies:   Allergies  Allergen Reactions  . Iohexol Hives and Other (See Comments)    Went away with benadryl   . Iodine Rash     DISCHARGE MEDICATIONS: Current Discharge Medication List    CONTINUE these medications which have NOT CHANGED   Details  acetaminophen (TYLENOL) 325 MG tablet Take 650 mg by mouth every 6 (six) hours as needed for mild pain.     albuterol (PROVENTIL HFA;VENTOLIN HFA) 108 (90 BASE) MCG/ACT inhaler Inhale 2 puffs into the lungs every 6 (six) hours as needed for wheezing or shortness of breath. Qty: 1 Inhaler, Refills: 6    atenolol-chlorthalidone (TENORETIC) 50-25 MG per tablet Take 0.5 tablets by mouth daily.     clopidogrel (PLAVIX) 75 MG tablet Take 75 mg by mouth daily.    ezetimibe-simvastatin (VYTORIN) 10-40 MG per tablet Take 1 tablet by mouth See admin instructions. Take 1 tablet by mouth daily on Monday, Wednesday, and Friday in evening.    guaiFENesin (MUCINEX) 600 MG 12 hr tablet Take 600 mg by mouth every evening.     levothyroxine (SYNTHROID, LEVOTHROID) 25 MCG tablet 37.5-50 mcg See admin instructions. Take 37.5 mcg by mouth daily Monday through Friday, and take 50 mcg by mouth daily on Saturday and Sunday.    !! Multiple Vitamins-Minerals (CENTRUM SILVER PO) Take 1 tablet by mouth daily.    !! Multiple Vitamins-Minerals (OCUVITE PRESERVISION PO) Take 1 tablet by  mouth 2 (two) times daily.    naproxen sodium (ANAPROX) 220 MG tablet Take 220 mg by mouth 2 (two) times daily with a meal.    sitaGLIPtan-metformin (JANUMET) 50-500 MG per tablet Take 1 tablet by mouth 2 (two) times daily with a meal.    loratadine (CLARITIN) 10 MG tablet Take 10 mg by mouth daily as needed for allergies. Reported on 01/03/2016     !! - Potential duplicate medications found. Please discuss with provider.    STOP taking these medications     HYDROcodone-acetaminophen (NORCO/VICODIN) 5-325 MG tablet          Brief H and P: For complete details please refer to admission H and P, but in brief She is a 83 year old male with prior stroke in 2005, diabetes mellitus, hypertension, hyperlipidemia, BPH, hypothyroidism,peripheral vascular disease-status post left internal carotid artery stentingand status post right carotid endarterectomy, macular degeneration presented to the emergency department via EMS after he developed an acute of confusion on the morning of admission. Patient's wife also noted that patient was having difficulty finding words, seemed to be confused, facial drooping on the left side at the time. CT head was negative. Patient was admitted for further workup.   Hospital Course:  TIA with previous history of stroke and mild residual right-sided weakness - MRI of the brain negative for acute CVA - patient underwent MRA of the brain and neck which stable and showed chronic left vertebral artery occlusion - 2Decho showed  EF 03%, grade 1 diastolic dysfunction - LDL 55, continue ezetemibe, simvastatin - Continue Plavix per neurology recommendations - PT eval recommended Home health PT, OT - carotid dopplers showed Right CEA is patent.  Left ICA stent is patent - EEG indicates diffuse cerebral dysfunction that is non-specific     DM type II - most recent HgbA1C is 6.7%on 12/01/16 - Held oral hypoglycemicswhile hospitalized, resume on discharge - patient was  placed on SSI while inpatient   CAD -currently stable,  no c/o chest pain, SOB  Hyperlipidemia Continue Vytorin 10-40 mg qd - LDL 55  COPD - stable maintain O2 saturations at room air greater than 92% Continue home inhalers  Hypothyroidism Continue levothyroxine  TSH 2.3   Day of Discharge BP (!) 99/58 (BP Location: Right Arm)   Pulse 98   Temp 98.2 F (36.8 C) (Axillary)   Resp 16   Ht 5\' 6"  (1.676 m)   Wt 68 kg (150 lb)   SpO2 98%   BMI 24.21 kg/m   Physical Exam: General: Alert and awake oriented x3 not in any acute distress. HEENT: anicteric sclera, pupils reactive to light and accommodation CVS: S1-S2 clear no murmur rubs or gallops Chest: clear to auscultation bilaterally, no wheezing rales or rhonchi Abdomen: soft nontender, nondistended, normal bowel sounds Extremities: no cyanosis, clubbing or edema noted bilaterally Neuro: Cranial nerves II-XII intact, no focal neurological deficits   The results of significant diagnostics from this hospitalization (including imaging, microbiology, ancillary and laboratory) are listed below for reference.    LAB RESULTS: Basic Metabolic Panel:  Recent Labs Lab 01/30/17 1226 01/30/17 2038 01/31/17 0427  NA 132*  --  132*  K 4.1  --  3.7  CL 97*  --  93*  CO2 26  --  23  GLUCOSE 123*  --  158*  BUN 13  --  12  CREATININE 1.10 1.12 1.08  CALCIUM 9.6  --  9.8   Liver Function Tests:  Recent Labs Lab 01/30/17 1226  AST 32  ALT 14*  ALKPHOS 40  BILITOT 1.2  PROT 6.3*  ALBUMIN 4.2   No results for input(s): LIPASE, AMYLASE in the last 168 hours. No results for input(s): AMMONIA in the last 168 hours. CBC:  Recent Labs Lab 01/30/17 1226 01/31/17 0427  WBC 7.1 11.1*  NEUTROABS 5.1  --   HGB 15.6 16.4  HCT 44.9 46.0  MCV 98.5 97.3  PLT 182 201   Cardiac Enzymes:  Recent Labs Lab 01/30/17 2315 01/31/17 0427  TROPONINI <0.03 <0.03   BNP: Invalid input(s): POCBNP CBG:  Recent Labs Lab  02/01/17 0610 02/01/17 1151  GLUCAP 163* 135*    Significant Diagnostic Studies:  No results found.  2D ECHO: Study Conclusions  - Left ventricle: The cavity size was normal. Wall thickness was   increased in a pattern of moderate LVH. Systolic function was   normal. The estimated ejection fraction was in the range of 60%   to 65%. Doppler parameters are consistent with abnormal left   ventricular relaxation (grade 1 diastolic dysfunction). - Aortic valve: Valve area (VTI): 3.32 cm^2. Valve area (Vmax):   2.89 cm^2. Valve area (Vmean): 2.46 cm^2. - Atrial septum: No defect or patent foramen ovale was identified. - Technically difficult study.  Disposition and Follow-up: Discharge Instructions    Ambulatory referral to Neurology    Complete by:  As directed    An appointment is requested in approximately: 4 weeks, Dr Erlinda Hong  DISPOSITION: home with home health PT    Ware, MD. Schedule an appointment as soon as possible for a visit in 2 week(s).   Specialty:  Internal Medicine Contact information: [address] [place] [address] [phone]        Xu,Jindong, MD. Schedule an appointment as soon as possible for a visit in 4 week(s).   Specialty:  Neurology Contact information: [address] [place] [address] [phone]            Time spent on Discharge: 11mins   Signed:   RAI,RIPUDEEP M.D. [facility] 02/01/2017, 3:46 PM Pager: [phone]

## 2017-02-01 NOTE — Progress Notes (Signed)
SLP addendum   01/31/17 1046  SLP G-Codes **NOT FOR INPATIENT CLASS**  Functional Assessment Tool Used skilled clinical judgement  Functional Limitations Spoken language expressive  Spoken Language Expression Current Status (X1062) CK  Spoken Language Expression Goal Status (I9485) CJ  Spoken Language Expression Discharge Status (I6270) CJ  SLP Evaluations  $ SLP Speech Visit 1 Procedure  SLP Evaluations  $ SLP EVAL LANGUAGE/SOUND PRODUCTION 1 Procedure  Dylan Knight Murna Backer M.Ed [employer] [phone]

## 2017-02-01 NOTE — Evaluation (Signed)
Physical Therapy Evaluation Patient Details Name: Dylan Knight MRN: [id number] DOB: Apr 13, 1924 Today's Date: 02/01/2017   History of Present Illness  Pt is a 83 y.o. male who presented to the ED with speech difficulty, L facial droop, and tremor involving the L hand. CT revealed stable old infarct with encephalomalacia in the L parietal and L occipital lobe. MRI revealed to acute intracranial abnormality. PMH significant for arthritis, benign prostatic hyperplasia, cancer, carotid artery occlusion, COPD, diabetes mellitus, hyperlipidemia, hypertension, hypothyroidism, macular degeneration, peripheral arterial disease, peripheral vascular disease, and stroke in 2005.  Clinical Impression  Patient presents with decreased independence and safety with mobility due to decreased strength, balance and speech difficulties.  He will benefit from skilled PT in the acute setting to address deficits and return home with wife assist following STSNF level rehab.  Currently requires min A for safety with all mobility and was previously independent with rollator and wife assist for IADL's/      Follow Up Recommendations SNF;Supervision/Assistance - 24 hour    Equipment Recommendations  Other (comment) (TBA)    Recommendations for Other Services       Precautions / Restrictions Precautions Precautions: Fall      Mobility  Bed Mobility Overal bed mobility: Needs Assistance Bed Mobility: Supine to Sit     Supine to sit: Mod assist     General bed mobility comments: lifting assist for trunk and cues with increased time to scoot to EOB  Transfers Overall transfer level: Needs assistance Equipment used: Rolling walker (2 wheeled) Transfers: Sit to/from Stand Sit to Stand: Min assist         General transfer comment: increased time, cues for hand placement, assist for balance  Ambulation/Gait Ambulation/Gait assistance: Min assist Ambulation Distance (Feet): 110 Feet Assistive device:  Rolling walker (2 wheeled) Gait Pattern/deviations: Step-to pattern;Shuffle;Decreased stride length;Wide base of support     General Gait Details: increased time for turns, cues for technique for safety (uses rollator at home), assist for keeping straight path and due to imbalance/tremor  Stairs            Wheelchair Mobility    Modified Rankin (Stroke Patients Only) Modified Rankin (Stroke Patients Only) Pre-Morbid Rankin Score: Moderate disability Modified Rankin: Moderately severe disability     Balance Overall balance assessment: Needs assistance   Sitting balance-Leahy Scale: Fair Sitting balance - Comments: difficulty with lifting leg for testing strength     Standing balance-Leahy Scale: Poor Standing balance comment: UE support for balance/ pt white knuckled with hands on walker for ambulation and reported he felt dizzy and wobbly                             Pertinent Vitals/Pain Pain Assessment: No/denies pain    Home Living Family/patient expects to be discharged to:: Private residence Living Arrangements: Spouse/significant other Available Help at Discharge: Family Type of Home: Apartment Home Access: Level entry     Home Layout: One level Home Equipment: Grab bars - toilet;Grab bars - tub/shower;Hand held shower head;Walker - 4 wheels;Cane - quad;Shower seat - built in      Prior Function Level of Independence: Independent with assistive device(s)         Comments: Pt's wife does all the driving. Pt performs basic ADL independently but slowly at home.     Hand Dominance   Dominant Hand: Right    Extremity/Trunk Assessment   Upper Extremity Assessment Upper Extremity Assessment:  Defer to OT evaluation    Lower Extremity Assessment Lower Extremity Assessment: Overall WFL for tasks assessed    Cervical / Trunk Assessment Cervical / Trunk Assessment: Kyphotic  Communication   Communication: Expressive difficulties   Cognition Arousal/Alertness: Awake/alert Behavior During Therapy: WFL for tasks assessed/performed Overall Cognitive Status: History of cognitive impairments - at baseline Area of Impairment: Problem solving                 Orientation Level: Disoriented to;Time   Memory: Decreased short-term memory       Problem Solving: Slow processing        General Comments      Exercises     Assessment/Plan    PT Assessment Patient needs continued PT services  PT Problem List Decreased balance;Decreased activity tolerance;Decreased knowledge of use of DME;Decreased cognition;Decreased mobility       PT Treatment Interventions DME instruction;Gait training;Functional mobility training;Neuromuscular re-education;Balance training;Therapeutic exercise;Patient/family education;Therapeutic activities;Cognitive remediation    PT Goals (Current goals can be found in the Care Plan section)       Frequency Min 3X/week   Barriers to discharge        Co-evaluation               End of Session Equipment Utilized During Treatment: Gait belt Activity Tolerance: Patient limited by fatigue Patient left: in chair;with call bell/phone within reach;with family/visitor present;with nursing/sitter in room Nurse Communication: Mobility status PT Visit Diagnosis: Other abnormalities of gait and mobility (R26.89);Unsteadiness on feet (R26.81)    Time: 3244-0102 PT Time Calculation (min) (ACUTE ONLY): 25 min   Charges:   PT Evaluation $PT Eval Moderate Complexity: 1 Procedure PT Treatments $Gait Training: 8-22 mins   PT G Codes:   PT G-Codes **NOT FOR INPATIENT CLASS** Functional Assessment Tool Used: AM-PAC 6 Clicks Basic Mobility Functional Limitation: Mobility: Walking and moving around Mobility: Walking and Moving Around Current Status (V2536): At least 40 percent but less than 60 percent impaired, limited or restricted Mobility: Walking and Moving Around Goal Status  [phone]): At least 20 percent but less than 40 percent impaired, limited or restricted    [place], [place] 02/01/2017   Reginia Naas 02/01/2017, 3:58 PM

## 2017-02-01 NOTE — Procedures (Signed)
ELECTROENCEPHALOGRAM REPORT  Date of Study: 02/01/2017  Patient's Name: Dylan Knight MRN: [id number] Date of Birth: Jun 14, 1924  Referring Provider: Dr. Rosalin Hawking  Clinical History: This is a 83 year old man with history of stroke, presenting with speech difficulties, left facial droop, and left hand tremor.  Medications: acetaminophen (TYLENOL) tablet 650 mg  albuterol (PROVENTIL) (2.5 MG/3ML) 0.083% nebulizer solution 3 mL  atenolol (TENORMIN) tablet 25 mg  chlorthalidone (HYGROTON) tablet 12.5 mg  clopidogrel (PLAVIX) tablet 75 mg  ezetimibe-simvastatin (VYTORIN) 10-40 MG per tablet 1 tablet  guaiFENesin (MUCINEX) 12 hr tablet 600 mg  heparin injection 5,000 Units  hydrALAZINE (APRESOLINE) injection 5 mg  HYDROcodone-acetaminophen (NORCO/VICODIN) 5-325 MG per tablet 1-2 tablet  insulin aspart (novoLOG) injection 0-5 Units  levothyroxine (SYNTHROID, LEVOTHROID) tablet 37.5 mcg   Technical Summary: A multichannel digital EEG recording measured by the international 10-20 system with electrodes applied with paste and impedances below 5000 ohms performed as portable with EKG monitoring in a predominantly drowsy and asleep patient.  Hyperventilation and photic stimulation were not performed.  The digital EEG was referentially recorded, reformatted, and digitally filtered in a variety of bipolar and referential montages for optimal display.   Description: The patient is predominantly drowsy and asleep during the recording.  During maximal wakefulness, there is a symmetric, medium voltage 8.5 Hz posterior dominant rhythm that attenuates with eye opening. This is admixed with a occasional 4-5 Hz theta and 2-3 Hz delta slowing of the background, with shifting asymmetry over the bilateral temporal regions, left greater than right.  During drowsiness and sleep, there is an increase in theta and delta slowing of the background with rare vertex waves seen.  Hyperventilation and photic  stimulation were not performed. There were no epileptiform discharges or electrographic seizures seen.    EKG lead showed irregular rhythm.  Impression: This predominantly drowsy and asleep EEG is abnormal due to occasional background slowing.  Clinical Correlation of the above findings indicates diffuse cerebral dysfunction that is non-specific in etiology and can be seen with hypoxic/ischemic injury, toxic/metabolic encephalopathies, neurodegenerative disorders, medication effect, or due to excessive drowsiness.  The absence of epileptiform discharges does not rule out a clinical diagnosis of epilepsy.  Clinical correlation is advised.   Ellouise Newer, M.D.

## 2017-02-01 NOTE — Care Management Note (Signed)
Case Management Note  Patient Details  Name: Dylan Knight MRN: [id number] Date of Birth: 06-15-1924  Subjective/Objective:   Pt in with TIA. He is from home with spouse.                  Action/Plan: Awaiting PT/OT recommendations. CM following for d/c needs, physician orders.   Expected Discharge Date:                  Expected Discharge Plan:     In-House Referral:     Discharge planning Services     Post Acute Care Choice:    Choice offered to:     DME Arranged:    DME Agency:     HH Arranged:    HH Agency:     Status of Service:  In process, will continue to follow  If discussed at Long Length of Stay Meetings, dates discussed:    Additional Comments:  Pollie Friar, RN 02/01/2017, 2:10 PM

## 2017-02-01 NOTE — Progress Notes (Signed)
VASCULAR LAB PRELIMINARY  PRELIMINARY  PRELIMINARY  PRELIMINARY  Carotid duplex completed.    Preliminary report: Right CEA is patent.  Left ICA stent is patent.  Vertebral artery flow is antegrade.   Wynston Romey, RVT 02/01/2017, 11:51 AM

## 2017-02-01 NOTE — Progress Notes (Addendum)
STROKE TEAM PROGRESS NOTE   SUBJECTIVE (INTERVAL HISTORY) His wife is at the bedside. Pt is sitting in chair comfortably. Did not see left hand tremor. He is going to carotid doppler and EEG.    OBJECTIVE Temp:  [96.3 F (35.7 C)-98.4 F (36.9 C)] 98.2 F (36.8 C) (03/26 0912) Pulse Rate:  [81-98] 98 (03/26 0912) Cardiac Rhythm: Normal sinus rhythm (03/26 1453) Resp:  [16-18] 16 (03/26 0912) BP: (99-157)/(55-72) 99/58 (03/26 0912) SpO2:  [91 %-99 %] 98 % (03/26 0912)  CBC:   Recent Labs Lab 01/30/17 1226 01/31/17 0427  WBC 7.1 11.1*  NEUTROABS 5.1  --   HGB 15.6 16.4  HCT 44.9 46.0  MCV 98.5 97.3  PLT 182 244    Basic Metabolic Panel:   Recent Labs Lab 01/30/17 1226 01/30/17 2038 01/31/17 0427  NA 132*  --  132*  K 4.1  --  3.7  CL 97*  --  93*  CO2 26  --  23  GLUCOSE 123*  --  158*  BUN 13  --  12  CREATININE 1.10 1.12 1.08  CALCIUM 9.6  --  9.8    Lipid Panel:     Component Value Date/Time   CHOL 136 01/31/2017 0427   CHOL 145 11/25/2014 0526   TRIG 140 01/31/2017 0427   TRIG 377 (H) 11/25/2014 0526   HDL 53 01/31/2017 0427   HDL 42 11/25/2014 0526   CHOLHDL 2.6 01/31/2017 0427   VLDL 28 01/31/2017 0427   VLDL 75 (H) 11/25/2014 0526   LDLCALC 55 01/31/2017 0427   LDLCALC 28 11/25/2014 0526   HgbA1c:  Lab Results  Component Value Date   HGBA1C 6.5 (H) 01/31/2017   Urine Drug Screen:     Component Value Date/Time   LABOPIA NONE DETECTED 01/30/2017 1315   COCAINSCRNUR NONE DETECTED 01/30/2017 1315   LABBENZ NONE DETECTED 01/30/2017 1315   AMPHETMU NONE DETECTED 01/30/2017 1315   THCU NONE DETECTED 01/30/2017 1315   LABBARB NONE DETECTED 01/30/2017 1315      IMAGING I have personally reviewed the radiological images below and agree with the radiology interpretations.  Ct Head Wo Contrast 01/30/2017 No acute intracranial abnormality. Stable cerebral atrophy. Stable old infarct with encephalomalacia in left parietal lobe and left  occipital lobe. No definite acute cortical infarction.   Mr Brain Wo Contrast 01/30/2017 1.  No acute intracranial abnormality.  2. Stable noncontrast MRI appearance the brain since 2016. Chronic left MCA and left PCA infarcts.  3. The examination had to be discontinued prior to completion due to patient confusion and agitation.   Mr [facility] and neck Wo Contrast 01/31/2017 IMPRESSION: 1. Study is motion degraded despite repeated imaging attempts. 2. Head and neck MRA appear grossly stable since 2016. 3. Chronic left vertebral artery occlusion. Chronic poor flow signal in the cervical left ICA, but in 2016 this was felt to be related to artifact from an ICA stent.   TTE - Left ventricle: The cavity size was normal. Wall thickness was   increased in a pattern of moderate LVH. Systolic function was   normal. The estimated ejection fraction was in the range of 60%   to 65%. Doppler parameters are consistent with abnormal left   ventricular relaxation (grade 1 diastolic dysfunction). - Aortic valve: Valve area (VTI): 3.32 cm^2. Valve area (Vmax):   2.89 cm^2. Valve area (Vmean): 2.46 cm^2. - Atrial septum: No defect or patent foramen ovale was identified. - Technically difficult study.  EEG -  This predominantly drowsy and asleep EEG is abnormal due to occasional background slowing. Clinical Correlation of the above findings indicates diffuse cerebral dysfunction that is non-specific in etiology and can be seen with hypoxic/ischemic injury, toxic/metabolic encephalopathies, neurodegenerative disorders, medication effect, or due to excessive drowsiness.  The absence of epileptiform discharges does not rule out a clinical diagnosis of epilepsy.  Clinical correlation is advised.  CUS - Right CEA is patent.  Left ICA stent is patent.  Vertebral artery flow is antegrade.   PHYSICAL EXAM  Temp:  [96.3 F (35.7 C)-98.4 F (36.9 C)] 98.2 F (36.8 C) (03/26 0912) Pulse Rate:  [81-98] 98 (03/26  0912) Resp:  [16-18] 16 (03/26 0912) BP: (99-157)/(55-72) 99/58 (03/26 0912) SpO2:  [91 %-99 %] 98 % (03/26 0912)  General - Well nourished, well developed, in no apparent distress.  Ophthalmologic - Fundi not visualized due to noncooperation.  Cardiovascular - Regular rate and rhythm.  Mental Status -  Level of arousal and orientation to month, place, and person were intact, not orientated to year. Language including expression, naming, repetition, comprehension was assessed and found intact. Fund of Knowledge was assessed and was impaired.  Cranial Nerves II - XII - II - Visual field intact OU. III, IV, VI - Extraocular movements intact. V - Facial sensation intact bilaterally. VII - Facial movement intact bilaterally. VIII - hard of hearing & vestibular intact bilaterally. X - Palate elevates symmetrically. XI - Chin turning & shoulder shrug intact bilaterally. XII - Tongue protrusion intact.  Motor Strength - The patient's strength was 4+/5 in all extremities and pronator drift was absent.  Bulk was normal and fasciculations were absent.   Motor Tone - Muscle tone was assessed at the neck and appendages and was normal.  Reflexes - The patient's reflexes were 1+ in all extremities and he had no pathological reflexes.  Sensory - Light touch, temperature/pinprick were assessed and were symmetrical.    Coordination - The patient had normal movements in the hands with no ataxia or dysmetria.  Mild action tremor on the left hand, no resting tremor.  Gait and Station - deferred.   ASSESSMENT/PLAN Dylan Knight is a 83 y.o. male with history of a previous stroke with residual mild right hemiparesis, peripheral vascular disease, carotid artery disease s/p carotid endarterectomy, coronary artery disease, hypothyroidism, hypertension, hyperlipidemia, diabetes mellitus, and COPD, presenting with speech difficulties, left facial droop, and tremor of the left hand. He did not  receive IV t-PA due to late presentation and improvement in deficits.  Possible TIA    Resultant deficit resolved  MRI - No acute intracranial abnormality, but remote left MCA infarct.  MRA  &N - motion degraded, no LVO, left VA occlusion  Carotid Doppler - Right CEA and Left ICA stent is patent.  2D Echo - EF 60-65%. No cardiac source of emboli identified.  EEG - background slowing, no seizure  LDL - 55  HgbA1c 6.5  VTE prophylaxis - subcutaneous heparin Diet heart healthy/carb modified Room service appropriate? Yes; Fluid consistency: Thin  clopidogrel 75 mg daily prior to admission, now on clopidogrel 75 mg daily. Wife reluctant to consider DAPT at this time, continue plavix on discharge.   Patient counseled to be compliant with his antithrombotic medications  Ongoing aggressive stroke risk factor management  Therapy recommendations: pending  Disposition: Pending  Hx of stroke  2005 left MCA infarct - seen in [place] with right side weakness - recovered well without redisue  11/2014 -  transient right INO - considered TIA  Hx of carotid stenosis  s/p right CEA  s/p left CAS  Follows with vascular surgery   CUS - Right CEA and Left ICA stent is patent.    Hypertension  Stable BP goal normotensive  Hyperlipidemia  Home meds:  Vytorin 10/40 mg Q M-W-F resumed in hospital  LDL 55, goal < 70  Continue statin at discharge  Diabetes  HbA1c 6.5, goal < 7.0  On SSI  Home [employer]   Follow up with PCP  Other Stroke Risk Factors  Advanced age  Former cigarette smoker - quit in 2005  Family hx stroke (mother)  Coronary artery disease  PVD  Other Active Problems  Hyponatremia - 132  Mild leukocytosis - 11.1 - afebrile  Hospital day # 1  Neurology will sign off. Please call with questions. Follow up with Cecille Rubin NP at [facility] in 6 weeks. Thanks for the consult.   Rosalin Hawking, MD PhD Stroke Neurology 02/01/2017 3:08  PM    To contact Stroke Continuity provider, please refer to [email or web address] After hours, contact General Neurology

## 2017-02-22 ENCOUNTER — Ambulatory Visit (INDEPENDENT_AMBULATORY_CARE_PROVIDER_SITE_OTHER): Payer: Medicare Other | Admitting: Podiatry

## 2017-02-22 ENCOUNTER — Encounter: Payer: Self-pay | Admitting: Podiatry

## 2017-02-22 VITALS — BP 138/73 | HR 79

## 2017-02-22 DIAGNOSIS — M79676 Pain in unspecified toe(s): Secondary | ICD-10-CM

## 2017-02-22 DIAGNOSIS — B351 Tinea unguium: Secondary | ICD-10-CM

## 2017-02-22 DIAGNOSIS — E1159 Type 2 diabetes mellitus with other circulatory complications: Secondary | ICD-10-CM

## 2017-02-22 NOTE — Progress Notes (Signed)
   Subjective:    Patient ID: Dylan Knight, male    DOB: Aug 29, 1924, 83 y.o.   MRN: [id number]  HPI this patient presents the office saying that he has long thick painful nails. He says he has been unable to self treat. He says he has been in the hospital and has not been able to work on the nails himself. He says the nails are painful walking and wearing his shoes. 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 He is now experiencing sciatic pain. He presents the office today for an evaluation and treatment of his feet    Review of Systems  All other systems reviewed and are negative.      Objective:   Physical Exam GENERAL APPEARANCE: Alert, conversant. Appropriately groomed. No acute distress.  VASCULAR: Pedal pulses are  palpable at  [facility] and PT bilateral.  Capillary refill time is immediate to all digits,  Normal temperature gradient.  Digital hair growth is present bilateral  NEUROLOGIC: sensation is normal to 5.07 monofilament at 5/5 sites bilateral.  Light touch is intact bilateral, Muscle strength normal.  MUSCULOSKELETAL: acceptable muscle strength, tone and stability bilateral.  Intrinsic muscluature intact bilateral.  Rectus appearance of foot and digits noted bilateral. DJD 1st MPJ left foot  DERMATOLOGIC: skin color, texture, and turgor are within normal limits.  No preulcerative lesions or ulcers  are seen, no interdigital maceration noted.  No open lesions present.   No drainage noted.  NAILS  Thick disfigured discolored nails both feet.            Assessment & Plan:  Onychomycosis   Diabetes with angiopathy  IE  Debridement of nails. RTC 3 months   Gardiner Barefoot DPM

## 2017-02-23 ENCOUNTER — Ambulatory Visit (INDEPENDENT_AMBULATORY_CARE_PROVIDER_SITE_OTHER): Payer: Medicare Other | Admitting: Family Medicine

## 2017-02-23 ENCOUNTER — Encounter: Payer: Self-pay | Admitting: Family Medicine

## 2017-02-23 DIAGNOSIS — E1159 Type 2 diabetes mellitus with other circulatory complications: Secondary | ICD-10-CM | POA: Diagnosis not present

## 2017-02-23 DIAGNOSIS — G451 Carotid artery syndrome (hemispheric): Secondary | ICD-10-CM | POA: Diagnosis not present

## 2017-02-23 DIAGNOSIS — H3530 Unspecified macular degeneration: Secondary | ICD-10-CM

## 2017-02-23 DIAGNOSIS — Z7189 Other specified counseling: Secondary | ICD-10-CM

## 2017-02-23 DIAGNOSIS — I739 Peripheral vascular disease, unspecified: Secondary | ICD-10-CM | POA: Diagnosis not present

## 2017-02-23 NOTE — Patient Instructions (Addendum)
Don't change your meds for now.   Recheck labs at the beginning of July before a visit here.  Update me as needed in the meantime.  I'll check back on your records between now and then.  Take care.  Glad to see you.

## 2017-02-23 NOTE — Progress Notes (Signed)
Pre visit review using our clinic review tool, if applicable. No additional management support is needed unless otherwise documented below in the visit note. 

## 2017-02-23 NOTE — Progress Notes (Signed)
New patient.   S/p inpatient tx for TIA last month.  History of stroke noted Has neuro f/u pending.  He is still doing his exercises at home but no long with HHPT.  h/o R sided weakness now at baseline.  He is living in a handicapped accessible apartment with his wife. He has not fallen recently. He uses a walker.  History of R sciatic nerve s/p inpatient rehab and [facility] PT.    History of macular degeneration. Injection done monthly at ophthalmology clinic.  History of peripheral vascular disease status post revascularization. Discussed with patient about risk factor control.  DM2.  rx done for Meter/lancets/strips at office visit then given to patient.   Compliant with medications. No adverse effect on medications. Not yet due for follow-up labs. Last A1c controlled. Discussed with patient.  Advance directive discussed with patient. Wife designated if patient were incapacitated  PMH and SH reviewed  ROS: Per HPI unless specifically indicated in ROS section   Meds, vitals, and allergies reviewed.   GEN: nad, alert and oriented HEENT: mucous membranes moist NECK: supple w/o LA CV: rrr.  PULM: ctab, no inc wob ABD: soft, +bs EXT: no edema SKIN: no acute rash Very slight gross weakness on the right side compared left.

## 2017-02-25 ENCOUNTER — Encounter: Payer: Self-pay | Admitting: Family Medicine

## 2017-02-25 DIAGNOSIS — Z7189 Other specified counseling: Secondary | ICD-10-CM | POA: Insufficient documentation

## 2017-02-25 DIAGNOSIS — H3530 Unspecified macular degeneration: Secondary | ICD-10-CM | POA: Insufficient documentation

## 2017-02-25 NOTE — Assessment & Plan Note (Signed)
Per ophthalmology  

## 2017-02-25 NOTE — Assessment & Plan Note (Signed)
  Advance directive discussed with patient. Wife designated if patient were incapacitated

## 2017-02-25 NOTE — Assessment & Plan Note (Signed)
Discussed with patient about risk factor modification with current medications. He agrees. No new focal symptoms at this point. TIA/stroke pathophysiology discussed with patient.

## 2017-02-25 NOTE — Assessment & Plan Note (Signed)
Continue risk factor reduction. Diabetes at goal. No new symptoms per patient report

## 2017-02-25 NOTE — Assessment & Plan Note (Signed)
>  30 minutes spent in face to face time with patient, >50% spent in counselling or coordination of care.  Controlled by last A1c. Continue current medications. Prescription done for meter lancets and strips. Given to patient. See after visit summary. He agrees with plan.

## 2017-03-08 ENCOUNTER — Telehealth: Payer: Self-pay

## 2017-03-08 NOTE — Telephone Encounter (Signed)
Mrs Cueva said pt got meter at [facility] and is not sure about operation of onetouch mini diabetic meter. I spoke with Estill Bamberg at [place] and pharmacist will go over how to use the diabetic meter with pt. Pt will call before going to make sure pharmacist has the time to go over with pt. Mrs Voong voiced understanding and will cb [facility] if needed.

## 2017-03-29 ENCOUNTER — Ambulatory Visit (INDEPENDENT_AMBULATORY_CARE_PROVIDER_SITE_OTHER): Payer: Medicare Other | Admitting: Nurse Practitioner

## 2017-03-29 ENCOUNTER — Encounter: Payer: Self-pay | Admitting: Nurse Practitioner

## 2017-03-29 VITALS — BP 152/86 | HR 80 | Ht 66.0 in | Wt 154.2 lb

## 2017-03-29 DIAGNOSIS — Z8673 Personal history of transient ischemic attack (TIA), and cerebral infarction without residual deficits: Secondary | ICD-10-CM

## 2017-03-29 DIAGNOSIS — G459 Transient cerebral ischemic attack, unspecified: Secondary | ICD-10-CM | POA: Diagnosis not present

## 2017-03-29 DIAGNOSIS — I6523 Occlusion and stenosis of bilateral carotid arteries: Secondary | ICD-10-CM

## 2017-03-29 DIAGNOSIS — I739 Peripheral vascular disease, unspecified: Secondary | ICD-10-CM | POA: Diagnosis not present

## 2017-03-29 DIAGNOSIS — I10 Essential (primary) hypertension: Secondary | ICD-10-CM | POA: Diagnosis not present

## 2017-03-29 DIAGNOSIS — E785 Hyperlipidemia, unspecified: Secondary | ICD-10-CM | POA: Insufficient documentation

## 2017-03-29 NOTE — Progress Notes (Signed)
[facility]  PATIENT: Dylan Knight DOB: Mar 20, 1924   REASON FOR VISIT: Hospital follow-up for TIA HISTORY FROM: Patient and wife    HISTORY OF PRESENT ILLNESS:This is a 83 year old man who presented to [facility] emergency department following an episode of speech difficulty on 01/31/17. History is obtained largely from the patient's wife as the patient is aphasic and has difficulty expressing himself.  His wife reports that they woke up about 8:30 this morning and the patient was in his usual state of health. They had breakfast around 10 AM at which time she noticed that he was having difficulty getting his words out. She also noticed that he had some left facial droop as well as a tremor involving the left hand. He was brought to the [facility] emergency department for further evaluation. He and his wife report that symptoms have improved somewhat. He no longer has a left facial droop. However, he continues to have tremor of his left hand as well as ongoing difficulty expressing himself, which his wife says tends to fluctuate a little bit. He has never had these symptoms before. He denies any symptoms in his arms. He denies any sensory changes. He has not hadany vision loss, blurry vision, or double vision. He denies any headache. He has had a previous stroke which left him with some mild residual weakness and difficulty using his right hand. Neurology consultation is requested for further recommendations. Of note, he takes Plavix due to underlying peripheral vascular disease. He also has had previous carotid disease for which she had a carotid endarterectomy. His wife reports that every 6 months he is seen by his vascular surgeon and they have been told that his carotids have been clear.Pt had stroke in 2005 with right side weakness.  Dylan Knight, returns to the stroke clinic for hospital follow-up. He remains on Plavix for secondary stroke prevention without further  stroke or TIA symptoms. He has a hemorrhage on his right eye, he states he has no pain there was no trauma most appropriate harder than usual and he has had these in the past. He is due to see his eye doctor this week. He has a history of macular degeneration. He remains on Vytorin for hyperlipidemia, denies any myalgias. He remains on Janumet for his diabetes. He does not check his blood sugars every day at his last check was 150. He sees vascular surgery every 6 months to follow his history of carotid endarterectomy. He ambulates with a rolling walker and denies any falls. He returns for reevaluation   REVIEW OF SYSTEMS: Full 14 system review of systems performed and notable only for those listed, all others are neg:  Constitutional: neg  Cardiovascular: neg Ear/Nose/Throat: Hearing loss  Skin: neg Eyes: Redness on the right  Respiratory: Shortness of breath, history of COPD Gastroitestinal: neg  Hematology/Lymphatic: neg  Endocrine: neg Musculoskeletal: Walking difficulty Allergy/Immunology: Environmental allergies Neurological: neg Psychiatric: neg Sleep : neg   ALLERGIES: Allergies  Allergen Reactions  . Iohexol Hives and Other (See Comments)    Went away with benadryl   . Iodine Rash    HOME MEDICATIONS: Outpatient Medications Prior to Visit  Medication Sig Dispense Refill  . acetaminophen (TYLENOL) 325 MG tablet Take 650 mg by mouth every 6 (six) hours as needed for mild pain.     Marland Kitchen albuterol (PROVENTIL HFA;VENTOLIN HFA) 108 (90 BASE) MCG/ACT inhaler Inhale 2 puffs into the lungs every 6 (six) hours as needed for wheezing or shortness  of breath. 1 Inhaler 6  . atenolol-chlorthalidone (TENORETIC) 50-25 MG per tablet Take 0.5 tablets by mouth daily.     . clopidogrel (PLAVIX) 75 MG tablet Take 75 mg by mouth daily.    Marland Kitchen ezetimibe-simvastatin (VYTORIN) 10-40 MG per tablet Take 1 tablet by mouth See admin instructions. Take 1 tablet by mouth daily on Monday, Wednesday, and  Friday in evening.    Marland Kitchen guaiFENesin (MUCINEX) 600 MG 12 hr tablet Take 600 mg by mouth every evening.     Marland Kitchen levothyroxine (SYNTHROID, LEVOTHROID) 25 MCG tablet 37.5-50 mcg See admin instructions. Take 37.5 mcg by mouth daily Monday through Friday, and take 50 mcg by mouth daily on Saturday and Sunday.    . loratadine (CLARITIN) 10 MG tablet Take 10 mg by mouth daily as needed for allergies. Reported on 01/03/2016    . Multiple Vitamins-Minerals (CENTRUM SILVER PO) Take 1 tablet by mouth daily.    . Multiple Vitamins-Minerals (OCUVITE PRESERVISION PO) Take 1 tablet by mouth 2 (two) times daily.    . naproxen sodium (ANAPROX) 220 MG tablet Take 220 mg by mouth 2 (two) times daily with a meal.    . sitaGLIPtan-metformin (JANUMET) 50-500 MG per tablet Take 1 tablet by mouth 2 (two) times daily with a meal.     No facility-administered medications prior to visit.     PAST MEDICAL HISTORY: Past Medical History:  Diagnosis Date  . Arthritis    left hip  . BPH (benign prostatic hyperplasia)   . Cancer (HCC)    Skin cancers removed  . Carotid artery occlusion   . COPD (chronic obstructive pulmonary disease) (HCC)   . Diabetes mellitus without complication (HCC)   . Hyperlipidemia   . Hypertension   . Hypothyroidism   . Macular degeneration   . Macular degeneration   . Peripheral arterial disease (HCC) 2002  . Stroke (HCC) 2005    PAST SURGICAL HISTORY: Past Surgical History:  Procedure Laterality Date  . ABDOMINAL AORTIC ANEURYSM REPAIR  2003   Aorto-left femoral-right iliac BPG by Dr. Dickson  . Aortic aneursym surgery     2003- Dr. Dickson  . BACK SURGERY     1960  . CAROTID ENDARTERECTOMY  Jan. 25,2007   Right CEA  . Carotid stenosis surgery on right     2007  . CARPAL TUNNEL RELEASE Left 06/02/2016   Procedure: LEFT CARPAL TUNNEL RELEASE;  Surgeon: Gary Kuzma, MD;  Location: [facility];  Service: Orthopedics;  Laterality: Left;  . EYE SURGERY     Catarct  surgery and lens implant - bilateral  . PR VEIN BYPASS GRAFT,AORTO-FEM-POP  2005   Revision Right Fem-pop  . PR VEIN BYPASS GRAFT,AORTO-FEM-POP  2002   Right Fem-pop  . RADIOLOGY WITH ANESTHESIA N/A 12/02/2016   Procedure: MRI LUMBAR SPINE WITHOUT;  Surgeon: Medication Radiologist, MD;  Location: [facility] OR;  Service: Radiology;  Laterality: N/A;  . Right leg bypass     20 02 -  and amputation of right fifth toe  . SPINE SURGERY  1960   lumbar spine  . [place], 2007   cervical spine  . TOE AMPUTATION  08-2001   right 5th toe amputation    FAMILY HISTORY: Family History  Problem Relation Age of Onset  . Stroke Mother   . Hypertension Mother   . Heart disease Father   . Heart attack Father   . Cancer Brother   . Hypertension Brother     SOCIAL  HISTORY: Social History   Social History  . Marital status: Married    Spouse name: N/A  . Number of children: N/A  . Years of education: N/A   Occupational History  . Not on file.   Social History Main Topics  . Smoking status: Former Smoker    Packs/day: 1.00    Years: 63.00    Types: Cigarettes    Quit date: 04/09/2004  . Smokeless tobacco: Former Systems developer     Comment: Quit smoking in 2005  . Alcohol use No  . Drug use: No  . Sexual activity: Not on file   Other Topics Concern  . Not on file   Social History Narrative   Former Dance movement psychotherapist for [employer]   Married 1996   From [place]. February '45 through February '47, honorable discharge. 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        PHYSICAL EXAM  Vitals:   03/29/17 0901  BP: (!) 152/86  Pulse: 80  Weight: 154 lb 3.2 oz (69.9 kg)  Height: 5\' 6"  (1.676 m)   Body mass index is 24.89 kg/m.  Generalized: Well developed, in no acute distress  Head: normocephalic and atraumatic,. Oropharynx benign  Neck: Supple, no carotid bruits  Cardiac: Regular rate rhythm, no murmur  Musculoskeletal: No deformity   Neurological examination     Mentation: Alert oriented to time, place, history taking. Attention span and concentration appropriate. Recent and remote memory intact.  Follows all commands speech and language fluent.   Cranial nerve II-XII: .Pupils were equal round reactive to light extraocular movements were full, visual field were full on confrontational test. Facial sensation and strength were normal. Hard of hearing . Uvula tongue midline. head turning and shoulder shrug were normal and symmetric.Tongue protrusion into cheek strength was normal. Motor: normal bulk and tone, full strength in the BUE, 4/5 Right lower extremity no pronator drift. Sensory: normal and symmetric to light touch, pinprick, and  Vibration, in the upper and lower extremities  Coordination: finger-nose-finger, heel-to-shin bilaterally, no dysmetria Reflexes: 1+ upper lower and symmetric, plantar responses were flexor bilaterally. Gait and Station: Rising up from seated position with push off, ambulates with rolling walker and leans to the right when ambulating , no difficulty with turns slow steady gait  DIAGNOSTIC DATA (LABS, IMAGING, TESTING) - I reviewed patient records, labs, notes, testing and imaging myself where available.  Lab Results  Component Value Date   WBC 11.1 (H) 01/31/2017   HGB 16.4 01/31/2017   HCT 46.0 01/31/2017   MCV 97.3 01/31/2017   PLT 201 01/31/2017      Component Value Date/Time   NA 132 (L) 01/31/2017 0427   NA 137 11/25/2014 0526   K 3.7 01/31/2017 0427   K 3.7 11/25/2014 0526   CL 93 (L) 01/31/2017 0427   CL 100 11/25/2014 0526   CO2 23 01/31/2017 0427   CO2 29 11/25/2014 0526   GLUCOSE 158 (H) 01/31/2017 0427   GLUCOSE 144 (H) 11/25/2014 0526   BUN 12 01/31/2017 0427   BUN 9 11/25/2014 0526   CREATININE 1.08 01/31/2017 0427   CREATININE 1.22 11/25/2014 0526   CALCIUM 9.8 01/31/2017 0427   CALCIUM 9.5 11/25/2014 0526   PROT 6.3 (L) 01/30/2017 1226   PROT 6.9 11/24/2014 1359   ALBUMIN 4.2  01/30/2017 1226   ALBUMIN 3.9 11/24/2014 1359   AST 32 01/30/2017 1226   AST 20 11/24/2014 1359   ALT 14 (L) 01/30/2017 1226  ALT 25 11/24/2014 1359   ALKPHOS 40 01/30/2017 1226   ALKPHOS 51 11/24/2014 1359   BILITOT 1.2 01/30/2017 1226   BILITOT 0.9 11/24/2014 1359   GFRNONAA 58 (L) 01/31/2017 0427   GFRNONAA 60 (L) 11/25/2014 0526   GFRNONAA 58 (L) 10/10/2013 0435   GFRAA >60 01/31/2017 0427   GFRAA >60 11/25/2014 0526   GFRAA >60 10/10/2013 0435   Lab Results  Component Value Date   CHOL 136 01/31/2017   HDL 53 01/31/2017   LDLCALC 55 01/31/2017   TRIG 140 01/31/2017   CHOLHDL 2.6 01/31/2017   Lab Results  Component Value Date   HGBA1C 6.5 (H) 01/31/2017    Lab Results  Component Value Date   TSH 2.390 01/30/2017      ASSESSMENT AND PLAN  83 y.o. year old male  has a past medical history of Arthritis; Carotid artery occlusion; COPD (chronic obstructive pulmonary disease) ([place]); Diabetes mellitus without complication ([place]); Hyperlipidemia; Hypertension;  Macular degeneration; Peripheral arterial disease ([place]) (2002); and Stroke ([place]) (2005).  Here to follow-up for hospital admission for speech difficulties and left facial droop. He did not receive IV TPA due to late presentation. MRI no acute intracranial abnormality. MRA no large vessel occlusion left VA occlusion. Carotid Doppler right CEA and left ICA stent patent. Patient would not consider DAPT. He remains on Plavix.The patient is a current patient of Dr.Xu  who is out of the office today . This note is sent to the work in doctor.      PLAN: Stressed the importance of management of risk factors to prevent further stroke/TIA Continue Plavix for secondary stroke prevention Maintain strict control of hypertension with blood pressure goal below 130/90, today's reading 152/86 continue antihypertensive medications Control of diabetes with hemoglobin A1c below 6.5 followed by primary care most recent hemoglobin A1c6.5  continue diabetic medications Cholesterol with LDL cholesterol less than 70, followed by primary care,  most recent 55 continue statin drug Vytorin Exercise by walking, slowly increase , eat healthy diet with whole grains,  fresh fruits and vegetables Use walker at all times for safe ambulation at risk for falls Follow-up with [place] in 6 months Discussed risk for recurrent stroke/ TIA and answered additional questions This was a  visit requiring 30 minutes and medical decision making of high complexity with extensive review of history, hospital chart, counseling and answering questions for the patient and the wife Dennie Bible, [facility], [facility], [facility] [address], [place], [place] [address] [phone]

## 2017-03-29 NOTE — Patient Instructions (Signed)
Stressed the importance of management of risk factors to prevent further stroke Continue Plavix for secondary stroke prevention Maintain strict control of hypertension with blood pressure goal below 130/90, today's reading 152/86 continue antihypertensive medications Control of diabetes with hemoglobin A1c below 6.5 followed by primary care most recent hemoglobin A1c6.5 continue diabetic medications Cholesterol with LDL cholesterol less than 70, followed by primary care,  most recent 55 continue statin drugs Exercise by walking, slowly increase , eat healthy diet with whole grains,  fresh fruits and vegetables Use walker at all times for safe ambulation at risk for falls Follow-up with [place] in 6 months

## 2017-03-31 ENCOUNTER — Encounter (HOSPITAL_COMMUNITY): Payer: Medicare Other

## 2017-03-31 ENCOUNTER — Ambulatory Visit: Payer: Medicare Other | Admitting: Family

## 2017-03-31 NOTE — Progress Notes (Signed)
I have read the note, and I agree with the clinical assessment and plan.  Richard A. Sater, MD, PhD Certified in Neurology, Clinical Neurophysiology, Sleep Medicine, Pain Medicine and Neuroimaging  [facility] [address], [address] [place], [place] [address] [phone]  

## 2017-04-01 ENCOUNTER — Encounter (INDEPENDENT_AMBULATORY_CARE_PROVIDER_SITE_OTHER): Payer: Medicare Other | Admitting: Ophthalmology

## 2017-04-01 DIAGNOSIS — H353221 Exudative age-related macular degeneration, left eye, with active choroidal neovascularization: Secondary | ICD-10-CM | POA: Diagnosis not present

## 2017-04-01 DIAGNOSIS — H35033 Hypertensive retinopathy, bilateral: Secondary | ICD-10-CM

## 2017-04-01 DIAGNOSIS — H43813 Vitreous degeneration, bilateral: Secondary | ICD-10-CM | POA: Diagnosis not present

## 2017-04-01 DIAGNOSIS — I10 Essential (primary) hypertension: Secondary | ICD-10-CM | POA: Diagnosis not present

## 2017-04-01 DIAGNOSIS — E11319 Type 2 diabetes mellitus with unspecified diabetic retinopathy without macular edema: Secondary | ICD-10-CM | POA: Diagnosis not present

## 2017-04-01 DIAGNOSIS — E113293 Type 2 diabetes mellitus with mild nonproliferative diabetic retinopathy without macular edema, bilateral: Secondary | ICD-10-CM

## 2017-04-01 DIAGNOSIS — H348112 Central retinal vein occlusion, right eye, stable: Secondary | ICD-10-CM | POA: Diagnosis not present

## 2017-04-01 LAB — HM DIABETES EYE EXAM

## 2017-04-02 ENCOUNTER — Other Ambulatory Visit: Payer: Self-pay | Admitting: Family Medicine

## 2017-04-12 ENCOUNTER — Encounter: Payer: Self-pay | Admitting: Emergency Medicine

## 2017-04-12 ENCOUNTER — Ambulatory Visit (INDEPENDENT_AMBULATORY_CARE_PROVIDER_SITE_OTHER): Payer: Medicare Other | Admitting: Emergency Medicine

## 2017-04-12 DIAGNOSIS — J449 Chronic obstructive pulmonary disease, unspecified: Secondary | ICD-10-CM | POA: Diagnosis not present

## 2017-04-12 DIAGNOSIS — R059 Cough, unspecified: Secondary | ICD-10-CM

## 2017-04-12 DIAGNOSIS — R05 Cough: Secondary | ICD-10-CM | POA: Diagnosis not present

## 2017-04-12 MED ORDER — IPRATROPIUM-ALBUTEROL 0.5-2.5 (3) MG/3ML IN SOLN: 3.0000 mL | RESPIRATORY_TRACT | 1 refills | Status: DC | PRN

## 2017-04-12 NOTE — Patient Instructions (Addendum)
Please keep Duonebs available to use as needed for shortness of breath. We will refill this prescription.  Continue your loratadine daily and mucinex daily.  Continue to work on you exercise and therapy.  Follow with Dr Lamonte Sakai in 6 months or sooner if you have any problems

## 2017-04-12 NOTE — Addendum Note (Signed)
Addended by: Benson Setting L on: 04/12/2017 11:46 AM   Modules accepted: Orders

## 2017-04-12 NOTE — Assessment & Plan Note (Signed)
Please keep Duonebs available to use as needed for shortness of breath. We will refill this prescription.  Continue your loratadine daily and mucinex daily.  Continue to work on you exercise and therapy.  Follow with Dr Lamonte Sakai in 6 months or sooner if you have any problems

## 2017-04-12 NOTE — Assessment & Plan Note (Signed)
Continue therapy for nasal allergies with loratadine. Continue Mucinex to enhance his secretion clearance.

## 2017-04-12 NOTE — Progress Notes (Signed)
  Subjective:    Patient ID: Dylan Knight, male    DOB: 11-24-1923, 83 y.o.   MRN: [id number]  HPI 83 yo former smoker, 60+ pk-yrs, hx DM, PVD (fem bypass), AAA with stenting, HTN, CVA '05. Was dx with COPD in 12/13 when he was admitted to [facility] for CAP and a flare. He was discharged on Advair and combivent, but he didn't stay on these - poor coordination taking HFA's. He has had many episodes of PNA vs AE, averages about once a year. Has been admitted 3 times in the past. Spirometry 2014 with moderately severe obstruction. Last seen 09/30/16. He has been managed on DuoNeb when necessary. He uses rarely. He reports that his breathing has been stable. His exertion is not being limited by breathing. He is using mucinex, loratadine.      Objective:   Physical Exam  Vitals:   04/12/17 1000  BP: 118/62  Pulse: 78  SpO2: 97%  Weight: 152 lb 6.4 oz (69.1 kg)  Height: 5\' 6"  (1.676 m)   Gen: Pleasant, elderly man, in no distress,  normal affect  ENT: No lesions,  mouth clear,  oropharynx clear, no postnasal drip  Neck: No JVD, no TMG, no carotid bruits  Lungs: No use of accessory muscles, distant, clear without rales or rhonchi  Cardiovascular: RRR, heart sounds normal, no murmur or gallops, no peripheral edema  Musculoskeletal: No deformities, no cyanosis or clubbing  Neuro: alert, non focal  Skin: Warm, no lesions or rash     Assessment & Plan:  COPD (chronic obstructive pulmonary disease) ([place]) Please keep Duonebs available to use as needed for shortness of breath. We will refill this prescription.  Continue your loratadine daily and mucinex daily.  Continue to work on you exercise and therapy.  Follow with Dr Lamonte Sakai in 6 months or sooner if you have any problems  Cough Continue therapy for nasal allergies with loratadine. Continue Mucinex to enhance his secretion clearance.  Baltazar Apo, MD, PhD 04/12/2017, 10:20 AM [facility] [phone] or if no  answer [phone]

## 2017-04-27 LAB — HM DIABETES EYE EXAM

## 2017-04-29 ENCOUNTER — Telehealth: Payer: Self-pay | Admitting: Emergency Medicine

## 2017-04-29 MED ORDER — IPRATROPIUM-ALBUTEROL 0.5-2.5 (3) MG/3ML IN SOLN: 3.0000 mL | RESPIRATORY_TRACT | 11 refills | Status: DC | PRN

## 2017-04-29 NOTE — Telephone Encounter (Signed)
Spoke with pt's wife (dpr on file), requesting refill on duoneb. This has been sent to preferred pharmacy.  Nothing further needed.

## 2017-05-03 ENCOUNTER — Other Ambulatory Visit: Payer: Self-pay | Admitting: Family Medicine

## 2017-05-03 DIAGNOSIS — E1159 Type 2 diabetes mellitus with other circulatory complications: Secondary | ICD-10-CM

## 2017-05-06 ENCOUNTER — Observation Stay
Admission: EM | Admit: 2017-05-06 | Discharge: 2017-05-07 | Disposition: A | Discharge status: Home / Self Care | Payer: Medicare Other | Attending: Internal Medicine | Admitting: Internal Medicine

## 2017-05-06 ENCOUNTER — Encounter: Payer: Self-pay | Admitting: Emergency Medicine

## 2017-05-06 ENCOUNTER — Emergency Department: Payer: Medicare Other

## 2017-05-06 DIAGNOSIS — Z79899 Other long term (current) drug therapy: Secondary | ICD-10-CM | POA: Insufficient documentation

## 2017-05-06 DIAGNOSIS — I2510 Atherosclerotic heart disease of native coronary artery without angina pectoris: Secondary | ICD-10-CM | POA: Diagnosis present

## 2017-05-06 DIAGNOSIS — E039 Hypothyroidism, unspecified: Secondary | ICD-10-CM | POA: Diagnosis not present

## 2017-05-06 DIAGNOSIS — L03116 Cellulitis of left lower limb: Secondary | ICD-10-CM | POA: Diagnosis present

## 2017-05-06 DIAGNOSIS — E1151 Type 2 diabetes mellitus with diabetic peripheral angiopathy without gangrene: Secondary | ICD-10-CM | POA: Diagnosis not present

## 2017-05-06 DIAGNOSIS — J449 Chronic obstructive pulmonary disease, unspecified: Secondary | ICD-10-CM | POA: Diagnosis not present

## 2017-05-06 DIAGNOSIS — J441 Chronic obstructive pulmonary disease with (acute) exacerbation: Secondary | ICD-10-CM | POA: Diagnosis present

## 2017-05-06 DIAGNOSIS — Z85828 Personal history of other malignant neoplasm of skin: Secondary | ICD-10-CM | POA: Insufficient documentation

## 2017-05-06 DIAGNOSIS — E785 Hyperlipidemia, unspecified: Secondary | ICD-10-CM | POA: Diagnosis not present

## 2017-05-06 DIAGNOSIS — N400 Enlarged prostate without lower urinary tract symptoms: Secondary | ICD-10-CM | POA: Insufficient documentation

## 2017-05-06 DIAGNOSIS — L0390 Cellulitis, unspecified: Secondary | ICD-10-CM | POA: Diagnosis present

## 2017-05-06 DIAGNOSIS — I10 Essential (primary) hypertension: Secondary | ICD-10-CM | POA: Diagnosis present

## 2017-05-06 DIAGNOSIS — Z8673 Personal history of transient ischemic attack (TIA), and cerebral infarction without residual deficits: Secondary | ICD-10-CM | POA: Diagnosis not present

## 2017-05-06 DIAGNOSIS — E119 Type 2 diabetes mellitus without complications: Secondary | ICD-10-CM

## 2017-05-06 DIAGNOSIS — M79605 Pain in left leg: Secondary | ICD-10-CM | POA: Diagnosis present

## 2017-05-06 DIAGNOSIS — Z87891 Personal history of nicotine dependence: Secondary | ICD-10-CM | POA: Diagnosis not present

## 2017-05-06 LAB — COMPREHENSIVE METABOLIC PANEL
ALT: 15 U/L — ABNORMAL LOW (ref 17–63)
AST: 25 U/L (ref 15–41)
Albumin: 4.3 g/dL (ref 3.5–5.0)
Alkaline Phosphatase: 52 U/L (ref 38–126)
Anion gap: 6 (ref 5–15)
BUN: 18 mg/dL (ref 6–20)
CO2: 27 mmol/L (ref 22–32)
Calcium: 9.3 mg/dL (ref 8.9–10.3)
Chloride: 94 mmol/L — ABNORMAL LOW (ref 101–111)
Creatinine, Ser: 1.06 mg/dL (ref 0.61–1.24)
GFR calc Af Amer: >60 mL/min (ref >60)
GFR calc non Af Amer: 59 mL/min — ABNORMAL LOW (ref >60)
Glucose, Bld: 134 mg/dL — ABNORMAL HIGH (ref 65–99)
Potassium: 3.6 mmol/L (ref 3.5–5.1)
Sodium: 127 mmol/L — ABNORMAL LOW (ref 135–145)
Total Bilirubin: 1 mg/dL (ref 0.3–1.2)
Total Protein: 6.6 g/dL (ref 6.5–8.1)

## 2017-05-06 LAB — CBC WITH DIFFERENTIAL/PLATELET
Basophils Absolute: 0.1 10*3/uL (ref 0–0.1)
Basophils Relative: 1 %
Eosinophils Absolute: 0.3 10*3/uL (ref 0–0.7)
Eosinophils Relative: 4 %
HCT: 40.6 % (ref 40.0–52.0)
Hemoglobin: 14.2 g/dL (ref 13.0–18.0)
Lymphocytes Relative: 15 %
Lymphs Abs: 1.1 10*3/uL (ref 1.0–3.6)
MCH: 34.8 pg — ABNORMAL HIGH (ref 26.0–34.0)
MCHC: 35 g/dL (ref 32.0–36.0)
MCV: 99.5 fL (ref 80.0–100.0)
Monocytes Absolute: 1 10*3/uL (ref 0.2–1.0)
Monocytes Relative: 13 %
Neutro Abs: 5 10*3/uL (ref 1.4–6.5)
Neutrophils Relative %: 67 %
Platelets: 231 10*3/uL (ref 150–440)
RBC: 4.08 MIL/uL — ABNORMAL LOW (ref 4.40–5.90)
RDW: 13.2 % (ref 11.5–14.5)
WBC: 7.4 10*3/uL (ref 3.8–10.6)

## 2017-05-06 LAB — PROTIME-INR
INR: 1.01
Prothrombin Time: 13.3 seconds (ref 11.4–15.2)

## 2017-05-06 MED ORDER — PIPERACILLIN-TAZOBACTAM 3.375 G IVPB 30 MIN
3.3750 g | Freq: Once | INTRAVENOUS | Status: AC
  Administered 2017-05-06: 3.375 g via INTRAVENOUS

## 2017-05-06 MED ORDER — PIPERACILLIN-TAZOBACTAM 3.375 G IVPB
INTRAVENOUS | Status: AC
  Administered 2017-05-06: 3.375 g via INTRAVENOUS
  Filled 2017-05-06: qty 50

## 2017-05-06 NOTE — ED Notes (Signed)
ED Provider at bedside. 

## 2017-05-06 NOTE — H&P (Signed)
[facility] [place] NAME: Dylan Knight    MR#:  [id number]  DATE OF BIRTH:  04-29-1924  DATE OF ADMISSION:  05/06/2017  PRIMARY CARE PHYSICIAN: Tonia Ghent, MD   REQUESTING/REFERRING PHYSICIAN: Burlene Arnt, MD  CHIEF COMPLAINT:   Chief Complaint  Patient presents with  . Leg Swelling    HISTORY OF PRESENT ILLNESS:  Dylan Knight  is a 83 y.o. male who presents with A mild lower extremity swelling. Patient states that his left lower extremity started to get red around the foot and ankle 1-2 days ago, and that it has progressed since then. It is somewhat warm, mildly tender. He has some swelling in that lower extremity. Here in the ED he was found to be negative on Doppler scan for DVT. Hospitalists were called for admission for treatment of cellulitis.  PAST MEDICAL HISTORY:   Past Medical History:  Diagnosis Date  . Arthritis    left hip  . BPH (benign prostatic hyperplasia)   . Cancer [facility])    Skin cancers removed  . Carotid artery occlusion   . COPD (chronic obstructive pulmonary disease) ([place])   . Diabetes mellitus without complication ([place])   . Hyperlipidemia   . Hypertension   . Hypothyroidism   . Macular degeneration   . Macular degeneration   . Peripheral arterial disease ([place]) 2002  . Stroke [facility]) 2005    PAST SURGICAL HISTORY:   Past Surgical History:  Procedure Laterality Date  . ABDOMINAL AORTIC ANEURYSM REPAIR  2003   Aorto-left femoral-right iliac BPG by Dr. Scot Dock  . Aortic aneursym surgery     2003- Dr. Scot Dock  . BACK SURGERY     1960  . CAROTID ENDARTERECTOMY  Jan. 25,2007   Right CEA  . Carotid stenosis surgery on right     2007  . CARPAL TUNNEL RELEASE Left 06/02/2016   Procedure: LEFT CARPAL TUNNEL RELEASE;  Surgeon: Daryll Brod, MD;  Location: [place];  Service: Orthopedics;  Laterality: Left;  . EYE SURGERY     Catarct surgery and lens implant - bilateral  . PR VEIN  BYPASS GRAFT,AORTO-FEM-POP  2005   Revision Right Fem-pop  . PR VEIN BYPASS GRAFT,AORTO-FEM-POP  2002   Right Fem-pop  . RADIOLOGY WITH ANESTHESIA N/A 12/02/2016   Procedure: MRI LUMBAR SPINE WITHOUT;  Surgeon: Medication Radiologist, MD;  Location: [place];  Service: Radiology;  Laterality: N/A;  . Right leg bypass     2002 -  and amputation of right fifth toe  . SPINE SURGERY  1960   lumbar spine  . [place], 2007   cervical spine  . TOE AMPUTATION  08-2001   right 5th toe amputation    SOCIAL HISTORY:   Social History  Substance Use Topics  . Smoking status: Former Smoker    Packs/day: 1.00    Years: 63.00    Types: Cigarettes    Quit date: 04/09/2004  . Smokeless tobacco: Former Systems developer     Comment: Quit smoking in 2005  . Alcohol use No    FAMILY HISTORY:   Family History  Problem Relation Age of Onset  . Stroke Mother   . Hypertension Mother   . Heart disease Father   . Heart attack Father   . Cancer Brother   . Hypertension Brother     DRUG ALLERGIES:   Allergies  Allergen Reactions  . Iohexol Hives and Other (See Comments)  Went away with benadryl   . Iodine Rash    MEDICATIONS AT HOME:   Prior to Admission medications   Medication Sig Start Date End Date Taking? Authorizing Provider  acetaminophen (TYLENOL) 325 MG tablet Take 650 mg by mouth every 6 (six) hours as needed for mild pain.     [provider]  albuterol (PROVENTIL HFA;VENTOLIN HFA) 108 (90 BASE) MCG/ACT inhaler Inhale 2 puffs into the lungs every 6 (six) hours as needed for wheezing or shortness of breath. 07/16/15   Collene Gobble, MD  atenolol-chlorthalidone (TENORETIC) 50-25 MG per tablet Take 0.5 tablets by mouth daily.  04/24/13   [provider]  clopidogrel (PLAVIX) 75 MG tablet Take 75 mg by mouth daily.    [provider]  ezetimibe-simvastatin (VYTORIN) 10-40 MG per tablet Take 1 tablet by mouth See admin instructions. Take 1 tablet by mouth  daily on Monday, Wednesday, and Friday in evening.    [provider]  guaiFENesin (MUCINEX) 600 MG 12 hr tablet Take 600 mg by mouth every evening.     [provider]  ipratropium-albuterol (DUONEB) 0.5-2.5 (3) MG/3ML SOLN Take 3 mLs by nebulization as needed. 04/29/17   Collene Gobble, MD  levothyroxine (SYNTHROID, LEVOTHROID) 25 MCG tablet TAKE 1.5 TABLET BY ORAL ROUTE EVERY DAY.M-F, 2 TABS ON SAT /SUN 04/06/17   Tonia Ghent, MD  loratadine (CLARITIN) 10 MG tablet Take 10 mg by mouth daily as needed for allergies. Reported on 01/03/2016    [provider]  Multiple Vitamins-Minerals (CENTRUM SILVER PO) Take 1 tablet by mouth daily.    [provider]  Multiple Vitamins-Minerals (OCUVITE PRESERVISION PO) Take 1 tablet by mouth 2 (two) times daily.    [provider]  naproxen sodium (ANAPROX) 220 MG tablet Take 220 mg by mouth 2 (two) times daily with a meal.    [provider]  sitaGLIPtan-metformin (JANUMET) 50-500 MG per tablet Take 1 tablet by mouth 2 (two) times daily with a meal.    [provider]    REVIEW OF SYSTEMS:  Review of Systems  Constitutional: Negative for chills, fever, malaise/fatigue and weight loss.  HENT: Negative for ear pain, hearing loss and tinnitus.   Eyes: Negative for blurred vision, double vision, pain and redness.  Respiratory: Negative for cough, hemoptysis and shortness of breath.   Cardiovascular: Negative for chest pain, palpitations, orthopnea and leg swelling.  Gastrointestinal: Negative for abdominal pain, constipation, diarrhea, nausea and vomiting.  Genitourinary: Negative for dysuria, frequency and hematuria.  Musculoskeletal: Negative for back pain, joint pain and neck pain.  Skin:       Left lower extremity erythema, swelling, tenderness  Neurological: Negative for dizziness, tremors, focal weakness and weakness.  Endo/Heme/Allergies: Negative for polydipsia. Does not bruise/bleed  easily.  Psychiatric/Behavioral: Negative for depression. The patient is not nervous/anxious and does not have insomnia.      VITAL SIGNS:   Vitals:   05/06/17 1901 05/06/17 2140 05/06/17 2246  BP: (!) 141/56 132/75 (!) 127/97  Pulse: 82 72 74  Resp: 18 18 18   Temp: 97.5 F (36.4 C)    TempSrc: Oral    SpO2: 98% 99% 96%  Weight: 68 kg (150 lb)    Height: 5\' 6"  (1.676 m)     Wt Readings from Last 3 Encounters:  05/06/17 68 kg (150 lb)  04/12/17 69.1 kg (152 lb 6.4 oz)  03/29/17 69.9 kg (154 lb 3.2 oz)    PHYSICAL EXAMINATION:  Physical  Exam  Vitals reviewed. Constitutional: He is oriented to person, place, and time. He appears well-developed and well-nourished. No distress.  HENT:  Head: Normocephalic and atraumatic.  Mouth/Throat: Oropharynx is clear and moist.  Eyes: Conjunctivae and EOM are normal. Pupils are equal, round, and reactive to light. No scleral icterus.  Neck: Normal range of motion. Neck supple. No JVD present. No thyromegaly present.  Cardiovascular: Normal rate, regular rhythm and intact distal pulses.  Exam reveals no gallop and no friction rub.   No murmur heard. Respiratory: Effort normal and breath sounds normal. No respiratory distress. He has no wheezes. He has no rales.  GI: Soft. Bowel sounds are normal. He exhibits no distension. There is no tenderness.  Musculoskeletal: Normal range of motion. He exhibits no edema.  No arthritis, no gout  Lymphadenopathy:    He has no cervical adenopathy.  Neurological: He is alert and oriented to person, place, and time. No cranial nerve deficit.  No dysarthria, no aphasia  Skin: Skin is warm and dry. No rash noted. There is erythema (With swelling and tenderness of the left lower extremity).  Psychiatric: He has a normal mood and affect. His behavior is normal. Judgment and thought content normal.    LABORATORY PANEL:   CBC  Recent Labs Lab 05/06/17 1902  WBC 7.4  HGB 14.2  HCT 40.6  PLT 231    ------------------------------------------------------------------------------------------------------------------  Chemistries   Recent Labs Lab 05/06/17 1902  NA 127*  K 3.6  CL 94*  CO2 27  GLUCOSE 134*  BUN 18  CREATININE 1.06  CALCIUM 9.3  AST 25  ALT 15*  ALKPHOS 52  BILITOT 1.0   ------------------------------------------------------------------------------------------------------------------  Cardiac Enzymes No results for input(s): TROPONINI in the last 168 hours. ------------------------------------------------------------------------------------------------------------------  RADIOLOGY:  US Venous Img Lower Unilateral Left  Result Date: 05/06/2017 CLINICAL DATA:  Left lower extremity pain, redness and edema for 2-3 days EXAM: Left LOWER EXTREMITY VENOUS DOPPLER ULTRASOUND TECHNIQUE: Gray-scale sonography with graded compression, as well as color Doppler and duplex ultrasound were performed to evaluate the lower extremity deep venous systems from the level of the common femoral vein and including the common femoral, femoral, profunda femoral, popliteal and calf veins including the posterior tibial, peroneal and gastrocnemius veins when visible. The superficial great saphenous vein was also interrogated. Spectral Doppler was utilized to evaluate flow at rest and with distal augmentation maneuvers in the common femoral, femoral and popliteal veins. COMPARISON:  None. FINDINGS: Contralateral Common Femoral Vein: Respiratory phasicity is normal and symmetric with the symptomatic side. No evidence of thrombus. Normal compressibility. Common Femoral Vein: No evidence of thrombus. Normal compressibility, respiratory phasicity and response to augmentation. Saphenofemoral Junction: No evidence of thrombus. Normal compressibility and flow on color Doppler imaging. Profunda Femoral Vein: No evidence of thrombus. Normal compressibility and flow on color Doppler imaging. Femoral Vein:  No evidence of thrombus. Normal compressibility, respiratory phasicity and response to augmentation. Popliteal Vein: No evidence of thrombus. Normal compressibility, respiratory phasicity and response to augmentation. Calf Veins: No evidence of thrombus. Normal compressibility and flow on color Doppler imaging. Other Findings:  Edema within the soft tissues of the left calf IMPRESSION: No evidence of DVT within the left lower extremity. Electronically Signed   By: Donavan Foil M.D.   On: 05/06/2017 22:29    EKG:   Orders placed or performed during the hospital encounter of 01/30/17  . EKG 12-Lead  . EKG 12-Lead  . ED EKG  . ED EKG  . EKG  IMPRESSION AND PLAN:  Principal Problem:   Cellulitis - IV antibiotics for tonight, expect to see improvement by tomorrow hopefully. Active Problems:   COPD (chronic obstructive pulmonary disease) ([place]) - continue home meds   Diabetes mellitus type 2, controlled ([place]) - sliding scale insulin with corresponding glucose checks   CAD (coronary artery disease) - continue home medications   HTN (hypertension) - controlled, continue home meds   Hypothyroidism - home dose thyroid replacement  All the records are reviewed and case discussed with ED provider. Management plans discussed with the patient and/or family.  DVT PROPHYLAXIS: SubQ lovenox  GI PROPHYLAXIS: None  ADMISSION STATUS: Observation  CODE STATUS: DNR Code Status History    Date Active Date Inactive Code Status Order ID Comments User Context   01/30/2017  5:04 PM 02/01/2017 10:37 PM DNR [id number]  Brenton Grills, PA-C ED   12/01/2016  4:04 AM 12/04/2016  7:07 PM DNR [id number]  Rise Patience, MD ED   11/02/2016  4:02 PM 11/03/2016  7:11 PM DNR [id number]  Vaughan Basta, MD Inpatient    Questions for Most Recent Historical Code Status (Order [id number])    Question Answer Comment   In the event of cardiac or respiratory ARREST Do not call a "code blue"    In the event  of cardiac or respiratory ARREST Do not perform Intubation, CPR, defibrillation or ACLS    In the event of cardiac or respiratory ARREST Use medication by any route, position, wound care, and other measures to relive pain and suffering. May use oxygen, suction and manual treatment of airway obstruction as needed for comfort.         Advance Directive Documentation     Most Recent Value  Type of Advance Directive  Healthcare Power of Attorney, Living will  Pre-existing out of facility DNR order (yellow form or pink MOST form)  -  "MOST" Form in Place?  -      TOTAL TIME TAKING CARE OF THIS PATIENT: 40 minutes.   Jannifer Franklin, Nyilah Kight Paia 05/06/2017, 11:30 PM  [facility]  Office  [phone]  CC: Primary care physician; Tonia Ghent, MD  Note:  This document was prepared using Dragon voice recognition software and may include unintentional dictation errors.

## 2017-05-06 NOTE — ED Provider Notes (Signed)
[facility] Emergency Department Provider Note  ____________________________________________   I have reviewed the triage vital signs and the nursing notes.   HISTORY  Chief Complaint Leg Swelling    HPI Dylan Knight is a 83 y.o. male  who has a history of COPD, diabetes mellitus, arthritis, poor circulation presents today complaining of left lower shortly swelling for the last several days. No fever but a worsening redness which is visible concern. He states that he has had no fall. He denies any trauma. He is no swelling to the right lower shortly. He has no history of PE or DVT. Symptoms for a few days nothing makes better nothing makes worsens up or walking on it, severity is moderate to significant, it is uncomfortable, cannot further describe it.     Past Medical History:  Diagnosis Date  . Arthritis    left hip  . BPH (benign prostatic hyperplasia)   . Cancer [facility])    Skin cancers removed  . Carotid artery occlusion   . COPD (chronic obstructive pulmonary disease) ([place])   . Diabetes mellitus without complication ([place])   . Hyperlipidemia   . Hypertension   . Hypothyroidism   . Macular degeneration   . Macular degeneration   . Peripheral arterial disease ([place]) 2002  . Stroke [facility]) 2005    Patient Active Problem List   Diagnosis Date Noted  . Hyperlipemia 03/29/2017  . HTN (hypertension) 03/29/2017  . Advance care planning 02/25/2017  . Macular degeneration 02/25/2017  . Transient speech disturbance   . Bilateral carotid artery stenosis   . History of stroke   . TIA (transient ischemic attack) 01/30/2017  . Acute ischemic stroke ([place]) 01/30/2017  . Low back pain with right-sided sciatica 12/01/2016  . Diabetes mellitus type 2, controlled ([place]) 12/01/2016  . CAD (coronary artery disease) 12/01/2016  . Hypothyroidism 12/01/2016  . Low back pain 12/01/2016  . Back pain 11/02/2016  . Aftercare following surgery of the circulatory  system, [place] 06/28/2013  . Cough 04/18/2013  . COPD (chronic obstructive pulmonary disease) ([place]) 11/22/2012  . Occlusion and stenosis of carotid artery without mention of cerebral infarction 06/29/2012  . Peripheral vascular disease ([place]) 12/30/2011    Past Surgical History:  Procedure Laterality Date  . ABDOMINAL AORTIC ANEURYSM REPAIR  2003   Aorto-left femoral-right iliac BPG by Dr. Scot Dock  . Aortic aneursym surgery     2003- Dr. Scot Dock  . BACK SURGERY     1960  . CAROTID ENDARTERECTOMY  Jan. 25,2007   Right CEA  . Carotid stenosis surgery on right     2007  . CARPAL TUNNEL RELEASE Left 06/02/2016   Procedure: LEFT CARPAL TUNNEL RELEASE;  Surgeon: Daryll Brod, MD;  Location: [place];  Service: Orthopedics;  Laterality: Left;  . EYE SURGERY     Catarct surgery and lens implant - bilateral  . PR VEIN BYPASS GRAFT,AORTO-FEM-POP  2005   Revision Right Fem-pop  . PR VEIN BYPASS GRAFT,AORTO-FEM-POP  2002   Right Fem-pop  . RADIOLOGY WITH ANESTHESIA N/A 12/02/2016   Procedure: MRI LUMBAR SPINE WITHOUT;  Surgeon: Medication Radiologist, MD;  Location: [place];  Service: Radiology;  Laterality: N/A;  . Right leg bypass     2002 -  and amputation of right fifth toe  . SPINE SURGERY  1960   lumbar spine  . [place], 2007   cervical spine  . TOE AMPUTATION  08-2001   right 5th toe amputation  Prior to Admission medications   Medication Sig Start Date End Date Taking? Authorizing Provider  acetaminophen (TYLENOL) 325 MG tablet Take 650 mg by mouth every 6 (six) hours as needed for mild pain.     [provider]  albuterol (PROVENTIL HFA;VENTOLIN HFA) 108 (90 BASE) MCG/ACT inhaler Inhale 2 puffs into the lungs every 6 (six) hours as needed for wheezing or shortness of breath. 07/16/15   Collene Gobble, MD  atenolol-chlorthalidone (TENORETIC) 50-25 MG per tablet Take 0.5 tablets by mouth daily.  04/24/13   [provider]  clopidogrel  (PLAVIX) 75 MG tablet Take 75 mg by mouth daily.    [provider]  ezetimibe-simvastatin (VYTORIN) 10-40 MG per tablet Take 1 tablet by mouth See admin instructions. Take 1 tablet by mouth daily on Monday, Wednesday, and Friday in evening.    [provider]  guaiFENesin (MUCINEX) 600 MG 12 hr tablet Take 600 mg by mouth every evening.     [provider]  ipratropium-albuterol (DUONEB) 0.5-2.5 (3) MG/3ML SOLN Take 3 mLs by nebulization as needed. 04/29/17   Collene Gobble, MD  levothyroxine (SYNTHROID, LEVOTHROID) 25 MCG tablet TAKE 1.5 TABLET BY ORAL ROUTE EVERY DAY.M-F, 2 TABS ON SAT /SUN 04/06/17   Tonia Ghent, MD  loratadine (CLARITIN) 10 MG tablet Take 10 mg by mouth daily as needed for allergies. Reported on 01/03/2016    [provider]  Multiple Vitamins-Minerals (CENTRUM SILVER PO) Take 1 tablet by mouth daily.    [provider]  Multiple Vitamins-Minerals (OCUVITE PRESERVISION PO) Take 1 tablet by mouth 2 (two) times daily.    [provider]  naproxen sodium (ANAPROX) 220 MG tablet Take 220 mg by mouth 2 (two) times daily with a meal.    [provider]  sitaGLIPtan-metformin (JANUMET) 50-500 MG per tablet Take 1 tablet by mouth 2 (two) times daily with a meal.    [provider]    Allergies Iohexol and Iodine  Family History  Problem Relation Age of Onset  . Stroke Mother   . Hypertension Mother   . Heart disease Father   . Heart attack Father   . Cancer Brother   . Hypertension Brother     Social History Social History  Substance Use Topics  . Smoking status: Former Smoker    Packs/day: 1.00    Years: 63.00    Types: Cigarettes    Quit date: 04/09/2004  . Smokeless tobacco: Former Systems developer     Comment: Quit smoking in 2005  . Alcohol use No    Review of Systems Constitutional: No fever/chills Eyes: No visual changes. ENT: No sore throat. No stiff neck no neck pain Cardiovascular: Denies  chest pain. Respiratory: Denies shortness of breath. Gastrointestinal:   no vomiting.  No diarrhea.  No constipation. Genitourinary: Negative for dysuria. Musculoskeletal: Positive lower extremity swelling Skin: Negative for redness or lower extremity. Neurological: Negative for severe headaches, focal weakness or numbness.   ____________________________________________   PHYSICAL EXAM:  VITAL SIGNS: ED Triage Vitals  Enc Vitals Group     BP 05/06/17 1901 (!) 141/56     Pulse Rate 05/06/17 1901 82     Resp 05/06/17 1901 18     Temp 05/06/17 1901 97.5 F (36.4 C)     Temp Source 05/06/17 1901 Oral     SpO2 05/06/17 1901 98 %     Weight 05/06/17 1901 150 lb (68 kg)     Height 05/06/17 1901 5'  6" (1.676 m)     Head Circumference --      Peak Flow --      Pain Score 05/06/17 1900 7     Pain Loc --      Pain Edu? --      Excl. in [place]? --     Constitutional: Alert and oriented. Well appearing and in no acute distress. Eyes: Conjunctivae are normal Head: Atraumatic HEENT: No congestion/rhinnorhea. Mucous membranes are moist.  Oropharynx non-erythematous Neck:   Nontender with no meningismus, no masses, no stridor Cardiovascular: Normal rate, regular rhythm. Grossly normal heart sounds.  Good peripheral circulation. Respiratory: Normal respiratory effort.  No retractions. Lungs CTAB. Abdominal: Soft and nontender. No distention. No guarding no rebound Back:  There is no focal tenderness or step off.  there is no midline tenderness there are no lesions noted. there is no CVA tenderness Musculoskeletal: Right lower showed a within normal left lower extremities shows significant edema into the foot with erythema, and swelling up the leg towards the calf. It is warm and to touch. There is slight ulceration between the toes. No evidence of gangrene, no evidence of necrotizing fasciitis. Foot is very warm to touch, well perfused. Doppler pulses are present. no upper extremity tenderness.  No joint effusions,  Neurologic:  Normal speech and language. No gross focal neurologic deficits are appreciated.  Skin:  Skin is warm, dry and intact. Oral erythema noted going up towards the calf on the left lower extremity Psychiatric: Mood and affect are normal. Speech and behavior are normal.  ____________________________________________   LABS (all labs ordered are listed, but only abnormal results are displayed)  Labs Reviewed  COMPREHENSIVE METABOLIC PANEL - Abnormal; Notable for the following:       Result Value   Sodium 127 (*)    Chloride 94 (*)    Glucose, Bld 134 (*)    ALT 15 (*)    GFR calc non Af Amer 59 (*)    All other components within normal limits  CBC WITH DIFFERENTIAL/PLATELET - Abnormal; Notable for the following:    RBC 4.08 (*)    MCH 34.8 (*)    All other components within normal limits  CULTURE, BLOOD (ROUTINE X 2)  CULTURE, BLOOD (ROUTINE X 2)  PROTIME-INR   ____________________________________________  EKG  I personally interpreted any EKGs ordered by me or triage  ____________________________________________  RADIOLOGY  I reviewed any imaging ordered by me or triage that were performed during my shift and, if possible, patient and/or family made aware of any abnormal findings. ____________________________________________   PROCEDURES  Procedure(s) performed: None  Procedures  Critical Care performed: None  ____________________________________________   INITIAL IMPRESSION / ASSESSMENT AND PLAN / ED COURSE  Pertinent labs & imaging results that were available during my care of the patient were reviewed by me and considered in my medical decision making (see chart for details).  Patient with left lower showed a swelling and erythema concerning for cellulitis. He does have pulses there, there is no evidence of DVT on ultrasound. He will be minute for IV antibiotics.    ____________________________________________   FINAL  CLINICAL IMPRESSION(S) / ED DIAGNOSES  Final diagnoses:  Cellulitis of left lower extremity      This chart was dictated using voice recognition software.  Despite best efforts to proofread,  errors can occur which can change meaning.      Schuyler Amor, MD 05/06/17 [phone]

## 2017-05-06 NOTE — ED Triage Notes (Signed)
Pt to triage via wheelchair, reports redness/swelling to left lower leg and foot, skin appears swollen and warm to touch.  Pt reports intermittent pain.  Pt states has upcoming appointment to have labwork completed but redness has progressively gotten worse.  Pt in NAD at this time, resp equal and unlabored, skin warm and dry

## 2017-05-06 NOTE — ED Notes (Signed)
Blue top sent. 

## 2017-05-06 NOTE — ED Notes (Signed)
This RN able to auscultate pulses using doppler. Site marked. MD McShane made aware.

## 2017-05-06 NOTE — ED Notes (Signed)
Patient transported to Ultrasound 

## 2017-05-07 ENCOUNTER — Ambulatory Visit: Payer: Medicare Other

## 2017-05-07 LAB — BASIC METABOLIC PANEL
Anion gap: 7 (ref 5–15)
BUN: 15 mg/dL (ref 6–20)
CO2: 30 mmol/L (ref 22–32)
Calcium: 9.5 mg/dL (ref 8.9–10.3)
Chloride: 96 mmol/L — ABNORMAL LOW (ref 101–111)
Creatinine, Ser: 1.1 mg/dL (ref 0.61–1.24)
GFR calc Af Amer: >60 mL/min (ref >60)
GFR calc non Af Amer: 57 mL/min — ABNORMAL LOW (ref >60)
Glucose, Bld: 139 mg/dL — ABNORMAL HIGH (ref 65–99)
Potassium: 3.5 mmol/L (ref 3.5–5.1)
Sodium: 133 mmol/L — ABNORMAL LOW (ref 135–145)

## 2017-05-07 LAB — GLUCOSE, CAPILLARY
Glucose-Capillary: 193 mg/dL — ABNORMAL HIGH (ref 65–99)
Glucose-Capillary: 152 mg/dL — ABNORMAL HIGH (ref 65–99)

## 2017-05-07 LAB — CBC
HCT: 40.1 % (ref 40.0–52.0)
Hemoglobin: 14.3 g/dL (ref 13.0–18.0)
MCH: 35.6 pg — ABNORMAL HIGH (ref 26.0–34.0)
MCHC: 35.6 g/dL (ref 32.0–36.0)
MCV: 99.8 fL (ref 80.0–100.0)
Platelets: 216 10*3/uL (ref 150–440)
RBC: 4.02 MIL/uL — ABNORMAL LOW (ref 4.40–5.90)
RDW: 13 % (ref 11.5–14.5)
WBC: 8.2 10*3/uL (ref 3.8–10.6)

## 2017-05-07 MED ORDER — EZETIMIBE 10 MG PO TABS
10.0000 mg | ORAL_TABLET | ORAL | Status: DC
  Filled 2017-05-07: qty 1

## 2017-05-07 MED ORDER — CEPHALEXIN 250 MG PO CAPS
250.0000 mg | ORAL_CAPSULE | Freq: Two times a day (BID) | ORAL | 0 refills | Status: DC
Start: 2017-05-07 — End: 2017-05-13

## 2017-05-07 MED ORDER — EZETIMIBE-SIMVASTATIN 10-40 MG PO TABS: 1.0000 | ORAL_TABLET | ORAL | Status: DC

## 2017-05-07 MED ORDER — LEVOTHYROXINE SODIUM 50 MCG PO TABS
25.0000 ug | ORAL_TABLET | Freq: Every day | ORAL | Status: DC
  Administered 2017-05-07: 25 ug via ORAL
  Filled 2017-05-07: qty 1

## 2017-05-07 MED ORDER — ENOXAPARIN SODIUM 40 MG/0.4ML ~~LOC~~ SOLN: 40.0000 mg | SUBCUTANEOUS | Status: DC

## 2017-05-07 MED ORDER — VANCOMYCIN HCL IN DEXTROSE 1-5 GM/200ML-% IV SOLN
1000.0000 mg | INTRAVENOUS | Status: DC
  Filled 2017-05-07: qty 200

## 2017-05-07 MED ORDER — PIPERACILLIN-TAZOBACTAM 3.375 G IVPB
3.3750 g | Freq: Three times a day (TID) | INTRAVENOUS | Status: DC
  Administered 2017-05-07: 3.375 g via INTRAVENOUS
  Filled 2017-05-07 (×3): qty 50

## 2017-05-07 MED ORDER — CHLORTHALIDONE 25 MG PO TABS
12.5000 mg | ORAL_TABLET | Freq: Every day | ORAL | Status: DC
  Administered 2017-05-07: 12.5 mg via ORAL
  Filled 2017-05-07: qty 0.5

## 2017-05-07 MED ORDER — ATENOLOL 25 MG PO TABS
25.0000 mg | ORAL_TABLET | Freq: Every day | ORAL | Status: DC
Start: 2017-05-07 — End: 2017-05-07
  Administered 2017-05-07: 25 mg via ORAL
  Filled 2017-05-07: qty 1

## 2017-05-07 MED ORDER — SIMVASTATIN 20 MG PO TABS: 40.0000 mg | ORAL_TABLET | ORAL | Status: DC

## 2017-05-07 MED ORDER — VANCOMYCIN HCL IN DEXTROSE 1-5 GM/200ML-% IV SOLN
1000.0000 mg | Freq: Once | INTRAVENOUS | Status: AC
  Administered 2017-05-07: 1000 mg via INTRAVENOUS
  Filled 2017-05-07: qty 200

## 2017-05-07 MED ORDER — INSULIN ASPART 100 UNIT/ML ~~LOC~~ SOLN: 0.0000 [IU] | Freq: Every day | SUBCUTANEOUS | Status: DC

## 2017-05-07 MED ORDER — ACETAMINOPHEN 325 MG PO TABS: 650.0000 mg | ORAL_TABLET | Freq: Four times a day (QID) | ORAL | Status: DC | PRN

## 2017-05-07 MED ORDER — ACETAMINOPHEN 650 MG RE SUPP: 650.0000 mg | Freq: Four times a day (QID) | RECTAL | Status: DC | PRN

## 2017-05-07 MED ORDER — INSULIN ASPART 100 UNIT/ML ~~LOC~~ SOLN
0.0000 [IU] | Freq: Three times a day (TID) | SUBCUTANEOUS | Status: DC
  Administered 2017-05-07: 2 [IU] via SUBCUTANEOUS
  Filled 2017-05-07: qty 1

## 2017-05-07 MED ORDER — ATENOLOL-CHLORTHALIDONE 50-25 MG PO TABS: 0.5000 | ORAL_TABLET | Freq: Every day | ORAL | Status: DC

## 2017-05-07 MED ORDER — ONDANSETRON HCL 4 MG PO TABS: 4.0000 mg | ORAL_TABLET | Freq: Four times a day (QID) | ORAL | Status: DC | PRN

## 2017-05-07 MED ORDER — CLOPIDOGREL BISULFATE 75 MG PO TABS
75.0000 mg | ORAL_TABLET | Freq: Every day | ORAL | Status: DC
Start: 2017-05-07 — End: 2017-05-07
  Administered 2017-05-07: 75 mg via ORAL
  Filled 2017-05-07: qty 1

## 2017-05-07 MED ORDER — ONDANSETRON HCL 4 MG/2ML IJ SOLN: 4.0000 mg | Freq: Four times a day (QID) | INTRAMUSCULAR | Status: DC | PRN

## 2017-05-07 NOTE — Discharge Instructions (Signed)

## 2017-05-07 NOTE — Progress Notes (Addendum)
Shift assessment completed. Pt is awake, alert and oriented, oob to chair, is HOH. Pt is on room air, lungs clear bilat, Hr is regular, abdomen is soft, bs heard. Pt is ambulating to bathroom with walker prn to void with contact guard. LLE is reddened from knee down to foot with +1 pitting edema noted. Pt stated that pain to the foot is intermittent, but that it feels better than on admission. piv #22 intact to R arm with iv med infusing, site free of redness and swelling. RLE is wnl. Since assessment, Dr. Manuella Ghazi has rounded on patient and entered pt's discharge. This writer has removed piv with catheter intact, pt tolerated well. Awaiting ride home at this time.Pt has also ambulated to the bathroom with walker and steady gait to void, returned to chair.

## 2017-05-07 NOTE — Care Management (Signed)
Case discussed with attending. He states patient has no discharge needs. Met with patient at bedside. He lives at home with his wife independently. He is alert , oriented and pleasant.  No needs identified. Case closed.

## 2017-05-07 NOTE — Progress Notes (Signed)
Pt dc'd via wc to front entrance of facility and into waiting family car. D/c instructions were reviewed with pt's wife as well prior to d/c.

## 2017-05-07 NOTE — Care Management Obs Status (Signed)
[place] NOTIFICATION   Patient Details  Name: Dylan Knight MRN: [id number] Date of Birth: 12-08-1923   Medicare Observation Status Notification Given:  Yes    Jolly Mango, RN 05/07/2017, 9:10 AM

## 2017-05-07 NOTE — Plan of Care (Signed)
Problem: Skin Integrity: Goal: Skin integrity will improve Outcome: Completed/Met Date Met: 05/07/17 Pt has met all goals for discharge, and is in agreement with discharge, stated he is feeling better.

## 2017-05-07 NOTE — Progress Notes (Signed)
Pharmacy Antibiotic Note  Dylan Knight is a 83 y.o. male admitted on 05/06/2017 with cellulitis.  Pharmacy has been consulted for vancomycin and Zosyn dosing.  Plan: DW 68kg  Vd 48L kei 0.038 hr-1  T1/2 18 hours Vancomycin 1 gram q 24 hours ordered with stacked dosing. Level before 5th dose.Goal trough 15-20.  Zosyn 3.375 grams q 8 hours ordered.  Height: 5\' 6"  (167.6 cm) Weight: 150 lb (68 kg) IBW/kg (Calculated) : 63.8  Temp (24hrs), Avg:97.6 F (36.4 C), Min:97.5 F (36.4 C), Max:97.7 F (36.5 C)   Recent Labs Lab 05/06/17 1902  WBC 7.4  CREATININE 1.06    Estimated Creatinine Clearance: 41 mL/min (by C-G formula based on SCr of 1.06 mg/dL).    Allergies  Allergen Reactions  . Iohexol Hives and Other (See Comments)    Went away with benadryl   . Iodine Rash    Antimicrobials this admission: vancomycin Zosyn 6/28 >>    >>   Dose adjustments this admission:   Microbiology results: 6/28 BCx: pending   Thank you for allowing pharmacy to be a part of this patient's care.  Mily Malecki S 05/07/2017 12:59 AM

## 2017-05-07 NOTE — Discharge Summary (Signed)
[facility] [place] NAME: Dylan Knight    MR#:  [id number]  DATE OF BIRTH:  01/07/1924  DATE OF ADMISSION:  05/06/2017   ADMITTING PHYSICIAN: Lance Coon, MD  DATE OF DISCHARGE: 05/07/2017 12:17 PM  PRIMARY CARE PHYSICIAN: Tonia Ghent, MD   ADMISSION DIAGNOSIS:  Left leg pain [M79.605] Cellulitis of left lower extremity [L03.116] DISCHARGE DIAGNOSIS:  Principal Problem:   Cellulitis Active Problems:   COPD (chronic obstructive pulmonary disease) (HCC)   Diabetes mellitus type 2, controlled (HCC)   CAD (coronary artery disease)   Hypothyroidism   HTN (hypertension)  SECONDARY DIAGNOSIS:   Past Medical History:  Diagnosis Date  . Arthritis    left hip  . BPH (benign prostatic hyperplasia)   . Cancer [facility] [place])    Skin cancers removed  . Carotid artery occlusion   . COPD (chronic obstructive pulmonary disease) ([place])   . Diabetes mellitus without complication ([place])   . Hyperlipidemia   . Hypertension   . Hypothyroidism   . Macular degeneration   . Macular degeneration   . Peripheral arterial disease ([place]) 2002  . Stroke [facility]) 2005   HOSPITAL COURSE:  83 y.o. male who presents with A mild lower extremity swelling. Patient states that his left lower extremity started to get red around the foot and ankle 1-2 days ago, and that it has progressed since then. It is somewhat warm, mildly tender. He has some swelling in that lower extremity. In the ED he was found to be negative on Doppler scan for DVT.   He responded well to IV Abx and being D/Ced on oral keflex. He's feeling much better.I've requested to elevate his leg. DISCHARGE CONDITIONS:  stable CONSULTS OBTAINED:   DRUG ALLERGIES:   Allergies  Allergen Reactions  . Iohexol Hives and Other (See Comments)    Went away with benadryl   . Iodine Rash   DISCHARGE MEDICATIONS:   Allergies as of 05/07/2017      Reactions   Iohexol Hives, Other (See Comments)   Went  away with benadryl   Iodine Rash      Medication List    TAKE these medications   acetaminophen 325 MG tablet Commonly known as:  TYLENOL Take 650 mg by mouth every 6 (six) hours as needed for mild pain.   albuterol 108 (90 Base) MCG/ACT inhaler Commonly known as:  PROVENTIL HFA;VENTOLIN HFA Inhale 2 puffs into the lungs every 6 (six) hours as needed for wheezing or shortness of breath.   atenolol-chlorthalidone 50-25 MG tablet Commonly known as:  TENORETIC Take 0.5 tablets by mouth daily.   cephALEXin 250 MG capsule Commonly known as:  KEFLEX Take 1 capsule (250 mg total) by mouth 2 (two) times daily.   clopidogrel 75 MG tablet Commonly known as:  PLAVIX Take 75 mg by mouth daily.   ezetimibe-simvastatin 10-40 MG tablet Commonly known as:  VYTORIN Take 1 tablet by mouth See admin instructions. Take 1 tablet by mouth daily on Monday, Wednesday, and Friday in evening.   guaiFENesin 600 MG 12 hr tablet Commonly known as:  MUCINEX Take 600 mg by mouth every evening.   ipratropium-albuterol 0.5-2.5 (3) MG/3ML Soln Commonly known as:  DUONEB Take 3 mLs by nebulization as needed. What changed:  reasons to take this   levothyroxine 25 MCG tablet Commonly known as:  SYNTHROID, LEVOTHROID TAKE 1.5 TABLET BY ORAL ROUTE EVERY DAY.M-F, 2 TABS ON SAT /SUN   loratadine 10  MG tablet Commonly known as:  CLARITIN Take 10 mg by mouth daily. Reported on 01/03/2016   OCUVITE PRESERVISION PO Take 1 tablet by mouth 2 (two) times daily.   CENTRUM SILVER PO Take 1 tablet by mouth daily.   sitaGLIPtin-metformin 50-500 MG tablet Commonly known as:  JANUMET Take 1 tablet by mouth 2 (two) times daily with a meal.        DISCHARGE INSTRUCTIONS:   DIET:  Regular diet DISCHARGE CONDITION:  Good ACTIVITY:  Activity as tolerated OXYGEN:  Home Oxygen: No.  Oxygen Delivery: room air DISCHARGE LOCATION:  home   If you experience worsening of your admission symptoms, develop  shortness of breath, life threatening emergency, suicidal or homicidal thoughts you must seek medical attention immediately by calling 911 or calling your MD immediately  if symptoms less severe.  You Must read complete instructions/literature along with all the possible adverse reactions/side effects for all the Medicines you take and that have been prescribed to you. Take any new Medicines after you have completely understood and accpet all the possible adverse reactions/side effects.   Please note  You were cared for by a hospitalist during your hospital stay. If you have any questions about your discharge medications or the care you received while you were in the hospital after you are discharged, you can call the unit and asked to speak with the hospitalist on call if the hospitalist that took care of you is not available. Once you are discharged, your primary care physician will handle any further medical issues. Please note that NO REFILLS for any discharge medications will be authorized once you are discharged, as it is imperative that you return to your primary care physician (or establish a relationship with a primary care physician if you do not have one) for your aftercare needs so that they can reassess your need for medications and monitor your lab values.    On the day of Discharge:  VITAL SIGNS:  Blood pressure (!) 142/72, pulse 81, temperature 98.7 F (37.1 C), resp. rate 16, height 5\' 6"  (1.676 m), weight 71.2 kg (156 lb 14.4 oz), SpO2 95 %. PHYSICAL EXAMINATION:  GENERAL:  83 y.o.-year-old patient lying in the bed with no acute distress.  EYES: Pupils equal, round, reactive to light and accommodation. No scleral icterus. Extraocular muscles intact.  HEENT: Head atraumatic, normocephalic. Oropharynx and nasopharynx clear.  NECK:  Supple, no jugular venous distention. No thyroid enlargement, no tenderness.  LUNGS: Normal breath sounds bilaterally, no wheezing, rales,rhonchi or  crepitation. No use of accessory muscles of respiration.  CARDIOVASCULAR: S1, S2 normal. No murmurs, rubs, or gallops.  ABDOMEN: Soft, non-tender, non-distended. Bowel sounds present. No organomegaly or mass.  EXTREMITIES: No pedal edema, cyanosis, or clubbing.  NEUROLOGIC: Cranial nerves II through XII are intact. Muscle strength 5/5 in all extremities. Sensation intact. Gait not checked.  PSYCHIATRIC: The patient is alert and oriented x 3.  SKIN: No obvious rash, lesion, or ulcer.  DATA REVIEW:   CBC  Recent Labs Lab 05/07/17 0416  WBC 8.2  HGB 14.3  HCT 40.1  PLT 216    Chemistries   Recent Labs Lab 05/06/17 1902 05/07/17 0416  NA 127* 133*  K 3.6 3.5  CL 94* 96*  CO2 27 30  GLUCOSE 134* 139*  BUN 18 15  CREATININE 1.06 1.10  CALCIUM 9.3 9.5  AST 25  --   ALT 15*  --   ALKPHOS 52  --   BILITOT 1.0  --  Follow-up Information    Tonia Ghent, MD. Schedule an appointment as soon as possible for a visit in 6 days.   Specialty:  Family Medicine Why:  @ 12 noon Contact information: [place] [address] [phone]           RADIOLOGY:  US Venous Img Lower Unilateral Left  Result Date: 05/06/2017 CLINICAL DATA:  Left lower extremity pain, redness and edema for 2-3 days EXAM: Left LOWER EXTREMITY VENOUS DOPPLER ULTRASOUND TECHNIQUE: Gray-scale sonography with graded compression, as well as color Doppler and duplex ultrasound were performed to evaluate the lower extremity deep venous systems from the level of the common femoral vein and including the common femoral, femoral, profunda femoral, popliteal and calf veins including the posterior tibial, peroneal and gastrocnemius veins when visible. The superficial great saphenous vein was also interrogated. Spectral Doppler was utilized to evaluate flow at rest and with distal augmentation maneuvers in the common femoral, femoral and popliteal veins. COMPARISON:  None. FINDINGS:  Contralateral Common Femoral Vein: Respiratory phasicity is normal and symmetric with the symptomatic side. No evidence of thrombus. Normal compressibility. Common Femoral Vein: No evidence of thrombus. Normal compressibility, respiratory phasicity and response to augmentation. Saphenofemoral Junction: No evidence of thrombus. Normal compressibility and flow on color Doppler imaging. Profunda Femoral Vein: No evidence of thrombus. Normal compressibility and flow on color Doppler imaging. Femoral Vein: No evidence of thrombus. Normal compressibility, respiratory phasicity and response to augmentation. Popliteal Vein: No evidence of thrombus. Normal compressibility, respiratory phasicity and response to augmentation. Calf Veins: No evidence of thrombus. Normal compressibility and flow on color Doppler imaging. Other Findings:  Edema within the soft tissues of the left calf IMPRESSION: No evidence of DVT within the left lower extremity. Electronically Signed   By: Donavan Foil M.D.   On: 05/06/2017 22:29     Management plans discussed with the patient, family and they are in agreement.  CODE STATUS: Prior   TOTAL TIME TAKING CARE OF THIS PATIENT: 45 minutes.    Max Sane M.D on 05/07/2017 at 7:22 PM  Between 7am to 6pm - Pager - [phone]  After 6pm go to [email or web address] - password [email or web address]  [facility] [place] [facility]  Office  [phone]  CC: Primary care physician; Tonia Ghent, MD   Note: This dictation was prepared with Dragon dictation along with smaller phrase technology. Any transcriptional errors that result from this process are unintentional.

## 2017-05-10 ENCOUNTER — Telehealth: Payer: Self-pay | Admitting: *Deleted

## 2017-05-10 ENCOUNTER — Ambulatory Visit: Payer: Medicare Other

## 2017-05-10 NOTE — Telephone Encounter (Signed)
Spoke to patient and his wife.  Transition Care Management Follow-up Telephone Call   Date discharged? 05/07/2017   How have you been since you were released from the hospital? Patient and his wife stated that his foot is doing better and it is not swollen and red as it was.   Do you understand why you were in the hospital? YES    Do you understand the discharge instructions? YES   Where were you discharged to? HOME   Items Reviewed:  Medications reviewed: YES  Allergies reviewed: YES  Dietary changes reviewed: YES  Referrals reviewed: YES}   Functional Questionnaire:   Activities of Daily Living (ADLs):   He states they are independent in the following: Patient stated that he is able to do daily living, dressing, bathing feeding, etc. States they require assistance with the following: patient stated that he does not need any assistance with daily activities.   Any transportation issues/concerns?: No, patient stated that his wife will drive him.   Any patient concerns? NO   Confirmed importance and date/time of follow-up visits scheduled YES    Provider Appointment booked with Dr. Damita Dunnings 05/13/17 at 12:00 noon  Confirmed with patient if condition begins to worsen call PCP or go to the ER.  Patient was given the office number and encouraged to call back with question or concerns.  : YES

## 2017-05-11 LAB — CULTURE, BLOOD (ROUTINE X 2)
Culture: NO GROWTH
Culture: NO GROWTH
Special Requests: ADEQUATE

## 2017-05-13 ENCOUNTER — Other Ambulatory Visit: Payer: Medicare Other

## 2017-05-13 ENCOUNTER — Ambulatory Visit (INDEPENDENT_AMBULATORY_CARE_PROVIDER_SITE_OTHER): Payer: Medicare Other | Admitting: Family Medicine

## 2017-05-13 ENCOUNTER — Encounter: Payer: Self-pay | Admitting: Family Medicine

## 2017-05-13 VITALS — BP 122/80 | HR 78 | Temp 97.6°F | Wt 152.5 lb

## 2017-05-13 DIAGNOSIS — E1159 Type 2 diabetes mellitus with other circulatory complications: Secondary | ICD-10-CM | POA: Diagnosis not present

## 2017-05-13 DIAGNOSIS — L03116 Cellulitis of left lower limb: Secondary | ICD-10-CM | POA: Diagnosis not present

## 2017-05-13 LAB — MICROALBUMIN / CREATININE URINE RATIO
Creatinine,U: 37.4 mg/dL
Microalb Creat Ratio: 1.9 mg/g (ref 0.0–30.0)
Microalb, Ur: <0.7 mg/dL (ref 0.0–1.9)

## 2017-05-13 LAB — COMPREHENSIVE METABOLIC PANEL
ALT: 13 U/L (ref 0–53)
AST: 19 U/L (ref 0–37)
Albumin: 4.6 g/dL (ref 3.5–5.2)
Alkaline Phosphatase: 60 U/L (ref 39–117)
BUN: 12 mg/dL (ref 6–23)
CO2: 32 mEq/L (ref 19–32)
Calcium: 10.7 mg/dL — ABNORMAL HIGH (ref 8.4–10.5)
Chloride: 93 mEq/L — ABNORMAL LOW (ref 96–112)
Creatinine, Ser: 1.13 mg/dL (ref 0.40–1.50)
GFR: 64.6 mL/min (ref >60.00)
Glucose, Bld: 104 mg/dL — ABNORMAL HIGH (ref 70–99)
Potassium: 3.9 mEq/L (ref 3.5–5.1)
Sodium: 132 mEq/L — ABNORMAL LOW (ref 135–145)
Total Bilirubin: 0.8 mg/dL (ref 0.2–1.2)
Total Protein: 7.4 g/dL (ref 6.0–8.3)

## 2017-05-13 LAB — HEMOGLOBIN A1C: Hgb A1c MFr Bld: 5.8 % (ref 4.6–6.5)

## 2017-05-13 MED ORDER — CEPHALEXIN 500 MG PO CAPS
500.0000 mg | ORAL_CAPSULE | Freq: Three times a day (TID) | ORAL | 0 refills | Status: DC
Start: 2017-05-13 — End: 2017-05-18

## 2017-05-13 NOTE — Progress Notes (Signed)
DATE OF ADMISSION:  05/06/2017      ADMITTING PHYSICIAN: Lance Coon, MD  DATE OF DISCHARGE: 05/07/2017 12:17 PM  PRIMARY CARE PHYSICIAN: Tonia Ghent, MD   ADMISSION DIAGNOSIS:  Left leg pain [M79.605] Cellulitis of left lower extremity [L03.116] DISCHARGE DIAGNOSIS:  Principal Problem:   Cellulitis Active Problems:   COPD (chronic obstructive pulmonary disease) (HCC)   Diabetes mellitus type 2, controlled (HCC)   CAD (coronary artery disease)   Hypothyroidism   HTN (hypertension)  SECONDARY DIAGNOSIS:       Past Medical History:  Diagnosis Date  . Arthritis    left hip  . BPH (benign prostatic hyperplasia)   . Cancer [facility])    Skin cancers removed  . Carotid artery occlusion   . COPD (chronic obstructive pulmonary disease) ([place])   . Diabetes mellitus without complication ([place])   . Hyperlipidemia   . Hypertension   . Hypothyroidism   . Macular degeneration   . Macular degeneration   . Peripheral arterial disease ([place]) 2002  . Stroke [facility]) 2005   HOSPITAL COURSE:  83 y.o.malewho presents with A mild lower extremity swelling. Patient states that his left lower extremity started to get red around the foot and ankle 1-2 days ago, and that it has progressed since then. It is somewhat warm, mildly tender. He has some swelling in that lower extremity. In the ED he was found to be negative on Doppler scan for DVT.   He responded well to IV Abx and being D/Ced on oral keflex. He's feeling much better.I've requested to elevate his leg.  ===============================================  Inpatient course discussed with patient. Left lower leg swelling and redness. Admitted for cellulitis. Ultrasound done, negative for DVT. Treated with IV antibiotics and transition to oral Keflex. He has improved some in the meantime with L foot less red and puffy but not back to baseline.  No fevers.  Still on kelfex 250mg  BID. He will finish his antibiotics tomorrow  morning..  Due for follow-up labs regarding diabetes. He rescheduled his appointment regarding diabetes.  PMH and SH reviewed  ROS: Per HPI unless specifically indicated in ROS section   Meds, vitals, and allergies reviewed.   GEN: nad, alert and oriented HEENT: mucous membranes moist NECK: supple w/o LA CV: rrr. PULM: ctab, no inc wob ABD: soft, +bs EXT: no edema on R foot but 1+ L foot edema.  SKIN: Redness especially noted on the left ankle, on the medial side. Consistent with cellulitis. Per patient report this is clearly improved from when he went to the hospital. No ulceration.

## 2017-05-13 NOTE — Patient Instructions (Signed)
Go to the lab on the way out.  We'll contact you with your lab report. Change to keflex 500mg  3 times a day.  Update me as needed.  Take care.  Glad to see you.

## 2017-05-13 NOTE — Addendum Note (Signed)
Addended by: Marchia Bond on: 05/13/2017 02:13 PM   Modules accepted: Orders

## 2017-05-13 NOTE — Assessment & Plan Note (Signed)
Improved but not resolved. Discussed with patient about options. Increase Keflex to 500 mg 3 times a day and recheck here in a few days. We will get his diabetic labs done today and we can go over those at a diabetic visit and also recheck his foot in a few days. Update me as needed in the meantime. No fevers. Okay for outpatient follow-up. Inpatient course and cellulitis pathophysiology and treatment options discussed with patient.

## 2017-05-17 NOTE — Telephone Encounter (Signed)
Please sign encounter if completed. 

## 2017-05-18 ENCOUNTER — Encounter: Payer: Self-pay | Admitting: Family Medicine

## 2017-05-18 ENCOUNTER — Ambulatory Visit (INDEPENDENT_AMBULATORY_CARE_PROVIDER_SITE_OTHER): Payer: Medicare Other | Admitting: Family Medicine

## 2017-05-18 VITALS — BP 114/58 | HR 76 | Temp 97.4°F | Wt 153.0 lb

## 2017-05-18 DIAGNOSIS — L03116 Cellulitis of left lower limb: Secondary | ICD-10-CM

## 2017-05-18 DIAGNOSIS — E785 Hyperlipidemia, unspecified: Secondary | ICD-10-CM

## 2017-05-18 DIAGNOSIS — E1169 Type 2 diabetes mellitus with other specified complication: Secondary | ICD-10-CM

## 2017-05-18 DIAGNOSIS — I10 Essential (primary) hypertension: Secondary | ICD-10-CM

## 2017-05-18 DIAGNOSIS — R269 Unspecified abnormalities of gait and mobility: Secondary | ICD-10-CM

## 2017-05-18 DIAGNOSIS — E039 Hypothyroidism, unspecified: Secondary | ICD-10-CM | POA: Diagnosis not present

## 2017-05-18 DIAGNOSIS — E1159 Type 2 diabetes mellitus with other circulatory complications: Secondary | ICD-10-CM

## 2017-05-18 MED ORDER — SITAGLIPTIN PHOS-METFORMIN HCL 50-500 MG PO TABS: 1.0000 | ORAL_TABLET | Freq: Every day | ORAL | Status: DC

## 2017-05-18 MED ORDER — CEPHALEXIN 500 MG PO CAPS
500.0000 mg | ORAL_CAPSULE | Freq: Three times a day (TID) | ORAL | 0 refills | Status: DC
Start: 2017-05-18 — End: 2017-05-28

## 2017-05-18 NOTE — Progress Notes (Signed)
Cellulitis.  Better in the meantime, improving more on higher dose of keflex.  Less red.  Less swollen.  No fevers.  No ADE on abx.  Likely slower healing with DM2, PAD, etc, dw pt.    He get 2nd dose of shingrix later on, d/w pt.    Diabetes:  Using medications without difficulties:yes Hypoglycemic episodes: no Hyperglycemic episodes: no Feet problems: see above re: cellulitis Blood Sugars averaging: ~150 eye exam within last year: yes, usually visits every  6 months with eye clinic.  He has f/u pending for 06/03/17.  He has injections for macular degeneration.  Last seen 04/01/17.  D/w pt about med taper with janumet, he agrees.  See med list/AVS.   Hypothyroidism. No neck mass or lumps. No dysphagia.  TSH wnl prev.    Hypertension:    Using medication without problems or lightheadedness: yes Chest pain with exertion:no Edema: not except for cellulitis.  Short of breath:no  Elevated Cholesterol: Using medications without problems:yes Muscle aches: no Diet compliance: yes Exercise: limited recently by cellulitis.  Labs d/w pt.   He has gait changes noted, he chronically leans to the right. This is not an acute change, it is a chronic positional change. He wanted to talk to orthopedics about it.  PMH and SH reviewed  Meds, vitals, and allergies reviewed.   ROS: Per HPI unless specifically indicated in ROS section   GEN: nad, alert and oriented HEENT: mucous membranes moist NECK: supple w/o LA CV: rrr. PULM: ctab, no inc wob ABD: soft, +bs EXT: no edema SKIN: no acute rash Gait abnormal where he chronically leans to his right, unclear how much this is affected by his recent cellulitis in the foot, without affecting weightbearing.  Diabetic foot exam R foot: Normal inspection except for R5th toe amputation No skin breakdown No calluses  Dec DP pulse Normal sensation to light touch and monofilament Nails normal  L foot with: Improved redness from cellulitis, improved  swelling.  No skin breakdown No calluses  Dec DP pulse Normal sensation to light touch and monofilament Nails normal

## 2017-05-18 NOTE — Patient Instructions (Signed)
Continue keflex until your foot is better and then for two extra days.  Cut the janumet back to 1 pill in the AM and recheck A1c in about 3 months.  Dylan Knight will call about your referral. Take care.  Glad to see you.  Update me as needed.

## 2017-05-19 DIAGNOSIS — R269 Unspecified abnormalities of gait and mobility: Secondary | ICD-10-CM | POA: Insufficient documentation

## 2017-05-19 NOTE — Assessment & Plan Note (Signed)
Given his A1c, cut back on Janumet 1 pill a day. Update me as needed in the meantime. Recheck A1c in about 3 months. The goal is to avoid any low sugars with this patient.

## 2017-05-19 NOTE — Assessment & Plan Note (Signed)
No thyromegaly on exam. TSH recently within normal limits. Discussed with patient. Continue as is. He agrees.

## 2017-05-19 NOTE — Assessment & Plan Note (Signed)
Improved, not resolved. Continue Keflex for another 7 days. Prescription sent. Update me as needed. Routine cautions given. He agrees.

## 2017-05-19 NOTE — Assessment & Plan Note (Signed)
Reasonable control. Continue statin. No adverse effect. He agrees.

## 2017-05-19 NOTE — Assessment & Plan Note (Signed)
Reasonable control. Continue as is. No change in meds. Labs discussed with patient.

## 2017-05-19 NOTE — Assessment & Plan Note (Signed)
Likely multifactorial, refer to orthopedics. He agrees.

## 2017-05-24 ENCOUNTER — Ambulatory Visit: Payer: Medicare Other | Admitting: Podiatry

## 2017-05-28 ENCOUNTER — Other Ambulatory Visit: Payer: Self-pay

## 2017-05-28 MED ORDER — CEPHALEXIN 500 MG PO CAPS: 500.0000 mg | ORAL_CAPSULE | Freq: Three times a day (TID) | ORAL | 0 refills | Status: DC

## 2017-05-28 NOTE — Telephone Encounter (Signed)
Sent.  Please have them update [place] Monday about his situation.  Thanks.

## 2017-05-28 NOTE — Telephone Encounter (Signed)
Wife advised. 

## 2017-05-28 NOTE — Telephone Encounter (Signed)
Dylan Knight()DPR signed) request refill cephalexin to [facility]; pt was seen on 05/18/17 and pt continues with cellulitis; skin still red and warm to touch; swelling is down but pt cannot get regular shoes on due to swelling. [facility].

## 2017-05-31 ENCOUNTER — Telehealth: Payer: Self-pay | Admitting: *Deleted

## 2017-05-31 NOTE — Telephone Encounter (Signed)
I want to recheck him.  Please schedule OV when possible.  Thanks.

## 2017-05-31 NOTE — Telephone Encounter (Signed)
Patient's wife left a voicemail stating that she was to call back today with an update on patient's foot. Mrs. Dicaprio stated that they did get the new script Friday. Mrs. Hagans stated that patient's foot is still swollen and can't get shoe is. Patient's wife stated that his foot is not as red, but is sore.

## 2017-05-31 NOTE — Telephone Encounter (Signed)
Appointment scheduled.

## 2017-06-02 ENCOUNTER — Encounter: Payer: Self-pay | Admitting: Family Medicine

## 2017-06-02 ENCOUNTER — Ambulatory Visit (INDEPENDENT_AMBULATORY_CARE_PROVIDER_SITE_OTHER): Payer: Medicare Other | Admitting: Family Medicine

## 2017-06-02 DIAGNOSIS — L03116 Cellulitis of left lower limb: Secondary | ICD-10-CM

## 2017-06-02 MED ORDER — DOXYCYCLINE HYCLATE 100 MG PO TABS: 100.0000 mg | ORAL_TABLET | Freq: Two times a day (BID) | ORAL | 0 refills | Status: DC

## 2017-06-02 MED ORDER — CEPHALEXIN 500 MG PO CAPS: 500.0000 mg | ORAL_CAPSULE | Freq: Three times a day (TID) | ORAL | 0 refills | Status: DC

## 2017-06-02 NOTE — Patient Instructions (Signed)
Continue keflex, add on doxycycline twice a day.  Update me early next week, sooner if needed.  Take care.  Glad to see you.

## 2017-06-02 NOTE — Progress Notes (Signed)
H/o L leg/foot cellulitis.  He has been slowly improving on Keflex 500 mg 3 times a day. His foot is less swollen red hot and painful, but is not back to baseline. He is able to bear weight. No systemic symptoms. No fevers. Has been compliant with medication. Here for recheck.  Meds, vitals, and allergies reviewed.   ROS: Per HPI unless specifically indicated in ROS section   nad ncat rrr ctab Right foot with normal inspection. Left lower leg and foot still puffy but not tender or hot. Dorsalis pedis pulse intact bilaterally. No fluctuant mass noted on the left foot that would need incision and drainage. Able to bear weight.

## 2017-06-03 ENCOUNTER — Other Ambulatory Visit: Payer: Self-pay | Admitting: Family Medicine

## 2017-06-03 ENCOUNTER — Encounter (INDEPENDENT_AMBULATORY_CARE_PROVIDER_SITE_OTHER): Payer: Medicare Other | Admitting: Ophthalmology

## 2017-06-03 DIAGNOSIS — I10 Essential (primary) hypertension: Secondary | ICD-10-CM | POA: Diagnosis not present

## 2017-06-03 DIAGNOSIS — H43813 Vitreous degeneration, bilateral: Secondary | ICD-10-CM

## 2017-06-03 DIAGNOSIS — E11319 Type 2 diabetes mellitus with unspecified diabetic retinopathy without macular edema: Secondary | ICD-10-CM

## 2017-06-03 DIAGNOSIS — H353221 Exudative age-related macular degeneration, left eye, with active choroidal neovascularization: Secondary | ICD-10-CM | POA: Diagnosis not present

## 2017-06-03 DIAGNOSIS — H348112 Central retinal vein occlusion, right eye, stable: Secondary | ICD-10-CM | POA: Diagnosis not present

## 2017-06-03 DIAGNOSIS — H35033 Hypertensive retinopathy, bilateral: Secondary | ICD-10-CM | POA: Diagnosis not present

## 2017-06-03 DIAGNOSIS — E113293 Type 2 diabetes mellitus with mild nonproliferative diabetic retinopathy without macular edema, bilateral: Secondary | ICD-10-CM

## 2017-06-03 NOTE — Assessment & Plan Note (Signed)
Still improving but slower than expected. No fluctuant mass. DVT previously ruled out with ultrasound. I don't suspect DVT at this point. Add on doxycycline, continue Keflex as is. Update me in a few days. Okay for outpatient follow-up. He agrees.

## 2017-06-07 ENCOUNTER — Telehealth: Payer: Self-pay | Admitting: *Deleted

## 2017-06-07 NOTE — Telephone Encounter (Signed)
Patient's wife left a voicemail stating that the new medication seems to be working better on his foot. [name] stated that his foot is still red and swollen, but is improving.

## 2017-06-08 ENCOUNTER — Encounter (INDEPENDENT_AMBULATORY_CARE_PROVIDER_SITE_OTHER): Payer: Self-pay | Admitting: Orthopaedic Surgery

## 2017-06-08 ENCOUNTER — Ambulatory Visit (INDEPENDENT_AMBULATORY_CARE_PROVIDER_SITE_OTHER): Payer: Medicare Other | Admitting: Orthopaedic Surgery

## 2017-06-08 VITALS — BP 162/77 | HR 70 | Ht 66.0 in | Wt 154.0 lb

## 2017-06-08 DIAGNOSIS — M5136 Other intervertebral disc degeneration, lumbar region: Secondary | ICD-10-CM

## 2017-06-08 NOTE — Progress Notes (Signed)
Office Visit Note   Patient: Dylan Knight           Date of Birth: [birth year]           MRN: [id number] Visit Date: 06/08/2017              Requested by: Tonia Ghent, MD [address] [place], [place] [address] PCP: Tonia Ghent, MD   Assessment & Plan: Visit Diagnoses:  1. Other intervertebral disc degeneration, lumbar region       Post L3-4 decompression 1980s with multilevel foraminal narrowing mild to moderate at every lumbar level  Plan: Patient likely has some increased disc bulge. He should get resolution of symptoms we'll try a lumbosacral corset to my given him some relief he can continue the Tylenol and office follow-up if he develops increasing weakness.  Follow-Up Instructions: No Follow-up on file.   Orders:  No orders of the defined types were placed in this encounter.  No orders of the defined types were placed in this encounter.     Procedures: No procedures performed   Clinical Data: No additional findings.   Subjective: Chief Complaint  Patient presents with  . Lower Back - Pain    HPI 83 year old male returns she states he's been prepared for more than 2 weeks he has been using a rolling walker with reverse 8. He said the back pain problems in the past with multilevel foraminal narrowing. Previous decompression sometime likely in the 80s which is at L3-4. He has some foraminal narrowing combination the right and left narrowing at every single lumbar level some mild some moderate. He's been seen in the past with back pain his symptoms usually has resolved with some time. Currently been standing up he is tilting to the right and has painful lumbosacral junction. He is just getting over some cellulitis in his left foot and is finishing up his antibiotics. He is use some Tylenol and some Aleve for his current symptoms. He was on Vibramycin tablets.  Review of Systems 14 part review of systems updated and unchanged from previous office  visit. Of note his recent brain MRI and CT scan which is unchanged from 2016 history of CVA, CAD, TIA, bilateral carotid stenosis, vertebral artery occlusion, COPD previous lumbar decompression surgery, hyperlipidemia. Intermittent recurrent back pain symptoms. ,  Objective: Vital Signs: BP (!) 162/77   Pulse 70   Ht 5\' 6"  (1.676 m)   Wt 154 lb (69.9 kg)   BMI 24.86 kg/m   Physical Exam  Constitutional: He is oriented to person, place, and time. He appears well-developed and well-nourished.  HENT:  Head: Normocephalic and atraumatic.  Eyes: Pupils are equal, round, and reactive to light. EOM are normal.  Neck: No tracheal deviation present. No thyromegaly present.  Cardiovascular: Normal rate.   Pulmonary/Chest: Effort normal. He has no wheezes.  Abdominal: Soft. Bowel sounds are normal.  Musculoskeletal:  Sonal left the lower extremity with recent negative Doppler for DVT. Cellulitis is resolved he still has some mild edema left lower extremity versus right. Good hip range of motion these reach full extension he stands with the tilt to the right has tenderness to palpation lumbosacral spine well-healed incision lumbar region. His balance is only fair uses a rolling walker and begin system Wolfe ambulates without it.  Neurological: He is alert and oriented to person, place, and time.  Skin: Skin is warm and dry. Capillary refill takes less than 2 seconds.  Psychiatric: He has  a normal mood and affect. His behavior is normal. Judgment and thought content normal.    Ortho Exam  Specialty Comments:  No specialty comments available.  Imaging: No results found.   PMFS History: Patient Active Problem List   Diagnosis Date Noted  . Gait abnormality 05/19/2017  . Cellulitis 05/06/2017  . Hyperlipemia 03/29/2017  . HTN (hypertension) 03/29/2017  . Advance care planning 02/25/2017  . Macular degeneration 02/25/2017  . Transient speech disturbance   . Bilateral carotid artery  stenosis   . History of stroke   . TIA (transient ischemic attack) 01/30/2017  . Acute ischemic stroke ([place]) 01/30/2017  . Low back pain with right-sided sciatica 12/01/2016  . Diabetes mellitus type 2, controlled ([place]) 12/01/2016  . CAD (coronary artery disease) 12/01/2016  . Hypothyroidism 12/01/2016  . Back pain 11/02/2016  . Aftercare following surgery of the circulatory system, [place] 06/28/2013  . Cough 04/18/2013  . COPD (chronic obstructive pulmonary disease) ([place]) 11/22/2012  . Occlusion and stenosis of carotid artery without mention of cerebral infarction 06/29/2012  . Peripheral vascular disease ([place]) 12/30/2011   Past Medical History:  Diagnosis Date  . Arthritis    left hip  . BPH (benign prostatic hyperplasia)   . Cancer [facility])    Skin cancers removed  . Carotid artery occlusion   . COPD (chronic obstructive pulmonary disease) ([place])   . Diabetes mellitus without complication ([place])   . Hyperlipidemia   . Hypertension   . Hypothyroidism   . Macular degeneration   . Macular degeneration   . Peripheral arterial disease ([place]) 2002  . Stroke [facility]) 2005    Family History  Problem Relation Age of Onset  . Stroke Mother   . Hypertension Mother   . Heart disease Father   . Heart attack Father   . Cancer Brother   . Hypertension Brother     Past Surgical History:  Procedure Laterality Date  . ABDOMINAL AORTIC ANEURYSM REPAIR  2003   Aorto-left femoral-right iliac BPG by Dr. Scot Dock  . Aortic aneursym surgery     2003- Dr. Scot Dock  . BACK SURGERY     1960  . CAROTID ENDARTERECTOMY  Jan. 25,2007   Right CEA  . Carotid stenosis surgery on right     2007  . CARPAL TUNNEL RELEASE Left 06/02/2016   Procedure: LEFT CARPAL TUNNEL RELEASE;  Surgeon: Daryll Brod, MD;  Location: [place];  Service: Orthopedics;  Laterality: Left;  . EYE SURGERY     Catarct surgery and lens implant - bilateral  . PR VEIN BYPASS GRAFT,AORTO-FEM-POP  2005   Revision Right  Fem-pop  . PR VEIN BYPASS GRAFT,AORTO-FEM-POP  2002   Right Fem-pop  . RADIOLOGY WITH ANESTHESIA N/A 12/02/2016   Procedure: MRI LUMBAR SPINE WITHOUT;  Surgeon: Medication Radiologist, MD;  Location: [place];  Service: Radiology;  Laterality: N/A;  . Right leg bypass     2002 -  and amputation of right fifth toe  . SPINE SURGERY  1960   lumbar spine  . [place], 2007   cervical spine  . TOE AMPUTATION  08-2001   right 5th toe amputation   Social History   Occupational History  . Not on file.   Social History Main Topics  . Smoking status: Former Smoker    Packs/day: 1.00    Years: 63.00    Types: Cigarettes    Quit date: 04/09/2004  . Smokeless tobacco: Former Systems developer     Comment:  Quit smoking in 2005  . Alcohol use No  . Drug use: No  . Sexual activity: Not on file

## 2017-06-08 NOTE — Telephone Encounter (Signed)
Wife advised. 

## 2017-06-08 NOTE — Telephone Encounter (Signed)
Many thanks. Glad he is better. Update me near the end of the current antibiotic course and we will go from there. Thanks.

## 2017-06-08 NOTE — Telephone Encounter (Signed)
No answer, no VM

## 2017-06-11 ENCOUNTER — Telehealth: Payer: Self-pay | Admitting: Family Medicine

## 2017-06-11 NOTE — Telephone Encounter (Signed)
Patient returned Dylan Knight's call. °

## 2017-06-11 NOTE — Telephone Encounter (Signed)
Patient seems to be doing better, still a little red, no swelling.  Patient is able to get his bedroom shoes off and on much easier.  Patient finishes his medications tomorrow.

## 2017-06-11 NOTE — Telephone Encounter (Signed)
Patient advised.

## 2017-06-11 NOTE — Telephone Encounter (Signed)
Caller Name:Nancy Lauter   Relationship to Patient:spouse  Best [phone]  Pharmacy:[facility]  Reason for call:update on pt condition

## 2017-06-11 NOTE — Telephone Encounter (Signed)
Many thanks.  Glad to hear.  Finish the current abx, then give this 1-2 days off meds. Update me Monday. I hope he'll not need another course. Thanks.

## 2017-06-14 MED ORDER — CEPHALEXIN 500 MG PO CAPS: 500.0000 mg | ORAL_CAPSULE | Freq: Three times a day (TID) | ORAL | 0 refills | Status: DC

## 2017-06-14 MED ORDER — DOXYCYCLINE HYCLATE 100 MG PO TABS: 100.0000 mg | ORAL_TABLET | Freq: Two times a day (BID) | ORAL | 0 refills | Status: DC

## 2017-06-14 NOTE — Addendum Note (Signed)
Addended by: Tonia Ghent on: 06/14/2017 02:08 PM   Modules accepted: Orders

## 2017-06-14 NOTE — Telephone Encounter (Addendum)
Restart both abx, I want to recheck him at the end of the week.  If worse in the meantime, then let me know.   Thanks.

## 2017-06-14 NOTE — Telephone Encounter (Signed)
Spoke to pts wife who states pt has completed abx. Site is still red and warm and pt states its still tender to the touch. Pt is requesting a call back to discuss how to proceed

## 2017-06-14 NOTE — Telephone Encounter (Signed)
Wife advised.  Appt scheduled. 

## 2017-06-17 ENCOUNTER — Ambulatory Visit (INDEPENDENT_AMBULATORY_CARE_PROVIDER_SITE_OTHER): Payer: Medicare Other | Admitting: Family Medicine

## 2017-06-17 ENCOUNTER — Encounter: Payer: Self-pay | Admitting: Family Medicine

## 2017-06-17 DIAGNOSIS — L03116 Cellulitis of left lower limb: Secondary | ICD-10-CM | POA: Diagnosis not present

## 2017-06-17 NOTE — Patient Instructions (Signed)
Finish the antibiotics, use the wrap in the meantime and recheck next week. If you have concerns, more pain, fever, then remove the bandage and call us. Take care.  Glad to see you.

## 2017-06-18 NOTE — Assessment & Plan Note (Signed)
He clearly had cellulitis previously, was improving with antibiotics. Still improving some in the meantime but slowly. I question how much of the residual is related to stasis dermatitis versus cellulitis versus a combination of the two. No fluctuant mass. Okay to use Unna boot at this point. Applied at office visit. Routine cautions given. It was not too tight, it felt good according to patient. Recheck in about 1 week. Continue antibiotics for now. Update me sooner if needed. He agrees.

## 2017-06-18 NOTE — Progress Notes (Signed)
Follow-up for cellulitis of left foot. Previously admitted to hospital. Has been on extended course of Keflex and doxycycline in the meantime, with improvement but not resolution. No fevers. Able to bear weight. Foot is less painful. Compliant with medications. No adverse effect on medications.  Meds, vitals, and allergies reviewed.   ROS: Per HPI unless specifically indicated in ROS section   nad rrr ctab Right foot with normal inspection. Left lower leg and foot less puffy than prev, not tender or hot. Dorsalis pedis pulse intact bilaterally. No fluctuant mass noted on the left foot that would need incision and drainage. Able to bear weight.  Previous negative for DVT on ultrasound.

## 2017-06-22 ENCOUNTER — Encounter: Payer: Self-pay | Admitting: Vascular Surgery

## 2017-06-24 ENCOUNTER — Ambulatory Visit (INDEPENDENT_AMBULATORY_CARE_PROVIDER_SITE_OTHER): Payer: Medicare Other | Admitting: Family Medicine

## 2017-06-24 ENCOUNTER — Encounter: Payer: Self-pay | Admitting: Family Medicine

## 2017-06-24 ENCOUNTER — Ambulatory Visit: Payer: Medicare Other

## 2017-06-24 VITALS — BP 126/62 | HR 78 | Temp 97.9°F | Wt 154.2 lb

## 2017-06-24 DIAGNOSIS — L03116 Cellulitis of left lower limb: Secondary | ICD-10-CM

## 2017-06-24 DIAGNOSIS — E785 Hyperlipidemia, unspecified: Secondary | ICD-10-CM | POA: Diagnosis not present

## 2017-06-24 NOTE — Assessment & Plan Note (Signed)
Didn't reapply unna boot. Will stay off the antibiotics for now.  If more leg swelling or pain or fever then he'll let me know.  He may need a compression stocking in the future, if more swelling.  Update me as needed.  He agrees.

## 2017-06-24 NOTE — Patient Instructions (Addendum)
Stay off the antibiotics for now.  If more leg swelling or pain or fever then let me know.  You may need a compression stocking in the future.  If you continue to have aches then we may need to change your cholesterol medicine.  Update me as needed.  Take care.  Glad to see you.  I'll await the vascular clinic notes.

## 2017-06-24 NOTE — Assessment & Plan Note (Signed)
If he continues to have aches then we may need to change his statin/dose.  D/w pt.

## 2017-06-24 NOTE — Progress Notes (Signed)
F/u L leg/foot cellulitis.  Off abx, no fevers.  Some aches, unclear if related to prev cellulitis vs statin, d/w pt . See AVS.  Has been in unna boot for 1 week.  His leg feels better.    His back pain is better with brace per Dr. Lorin Mercy.    Meds, vitals, and allergies reviewed.   ROS: Per HPI unless specifically indicated in ROS section   nad rrr ctab abd soft L leg with no edema now- similar diameter to R lower leg now.  No pedal edema.  DP pulse intact on the L foot.  Cellulitis appear resolved. No fluctuant mass.  No spreading redness.  He has postinflammatory changes but no acute changes on the skin.

## 2017-06-30 ENCOUNTER — Ambulatory Visit (INDEPENDENT_AMBULATORY_CARE_PROVIDER_SITE_OTHER)
Admission: RE | Admit: 2017-06-30 | Discharge: 2017-06-30 | Disposition: A | Discharge status: Home / Self Care | Payer: Medicare Other | Source: Ambulatory Visit | Attending: Family | Admitting: Family

## 2017-06-30 ENCOUNTER — Encounter: Payer: Self-pay | Admitting: Vascular Surgery

## 2017-06-30 ENCOUNTER — Ambulatory Visit (HOSPITAL_COMMUNITY)
Admission: RE | Admit: 2017-06-30 | Discharge: 2017-06-30 | Disposition: A | Discharge status: Home / Self Care | Payer: Medicare Other | Source: Ambulatory Visit | Attending: Vascular Surgery | Admitting: Vascular Surgery

## 2017-06-30 ENCOUNTER — Ambulatory Visit (INDEPENDENT_AMBULATORY_CARE_PROVIDER_SITE_OTHER): Payer: Medicare Other | Admitting: Vascular Surgery

## 2017-06-30 VITALS — BP 139/72 | HR 77 | Ht 66.0 in | Wt 155.0 lb

## 2017-06-30 DIAGNOSIS — I872 Venous insufficiency (chronic) (peripheral): Secondary | ICD-10-CM

## 2017-06-30 DIAGNOSIS — Z48812 Encounter for surgical aftercare following surgery on the circulatory system: Secondary | ICD-10-CM

## 2017-06-30 DIAGNOSIS — I6523 Occlusion and stenosis of bilateral carotid arteries: Secondary | ICD-10-CM

## 2017-06-30 DIAGNOSIS — Z9889 Other specified postprocedural states: Secondary | ICD-10-CM | POA: Diagnosis not present

## 2017-06-30 DIAGNOSIS — I779 Disorder of arteries and arterioles, unspecified: Secondary | ICD-10-CM

## 2017-06-30 DIAGNOSIS — Z4889 Encounter for other specified surgical aftercare: Secondary | ICD-10-CM | POA: Diagnosis not present

## 2017-06-30 NOTE — Progress Notes (Signed)
Patient name: Dylan Knight MRN: [id number] DOB: 1924-07-11 Sex: male  REASON FOR VISIT:    Follow up of peripheral vascular disease and carotid disease.  HPI:   Dylan Knight is a pleasant 83 y.o. male who while last saw on 09/30/2016.   He has had multiple previous vascular procedures: 1. He had a right carotid endarterectomy in 2007. 2. He has had a previous left carotid stent. 3. He underwent repair of an abdominal aortic aneurysm in 2003. 4. He had a right femoropopliteal bypass graft in 2005.  When I saw him last, his right femoropopliteal bypass graft was patent. He was not a smoker. I encouraged him to stay as active as possible. He was to come back in August for his yearly carotid duplex scan.  Since I saw him last, he did develop cellulitis and swelling in the left leg. He had a duplex scan at an outlying hospital in June which showed no evidence of DVT. He required 6 weeks of antibiotics for the cellulitis of his left leg. He does have some chronic venous insufficiency and I suspect that this is related to that. However the cellulitis has improved significantly.  I do not get any clear-cut history of claudication although I think his activity is fairly limited. He denies any history of rest pain.  He has had problems with sciatica and back pain which is chronic.  He denies any history of stroke, TIAs, expressive or receptive aphasia.   Past Medical History:  Diagnosis Date  . Arthritis    left hip  . BPH (benign prostatic hyperplasia)   . Cancer [facility])    Skin cancers removed  . Carotid artery occlusion   . COPD (chronic obstructive pulmonary disease) ([place])   . Diabetes mellitus without complication ([place])   . Hyperlipidemia   . Hypertension   . Hypothyroidism   . Macular degeneration   . Macular degeneration   . Peripheral arterial disease ([place]) 2002  . Stroke [facility]) 2005    Family History  Problem Relation Age of Onset  . Stroke Mother   . Hypertension  Mother   . Heart disease Father   . Heart attack Father   . Cancer Brother   . Hypertension Brother     SOCIAL HISTORY: Social History  Substance Use Topics  . Smoking status: Former Smoker    Packs/day: 1.00    Years: 63.00    Types: Cigarettes    Quit date: 04/09/2004  . Smokeless tobacco: Former Systems developer     Comment: Quit smoking in 2005  . Alcohol use No    Allergies  Allergen Reactions  . Iohexol Hives and Other (See Comments)    Went away with benadryl   . Iodine Rash    Current Outpatient Prescriptions  Medication Sig Dispense Refill  . acetaminophen (TYLENOL) 325 MG tablet Take 650 mg by mouth every 6 (six) hours as needed for mild pain.     Marland Kitchen albuterol (PROVENTIL HFA;VENTOLIN HFA) 108 (90 BASE) MCG/ACT inhaler Inhale 2 puffs into the lungs every 6 (six) hours as needed for wheezing or shortness of breath. 1 Inhaler 6  . atenolol-chlorthalidone (TENORETIC) 50-25 MG per tablet Take 0.5 tablets by mouth daily.     . clopidogrel (PLAVIX) 75 MG tablet Take 75 mg by mouth daily.    Marland Kitchen ezetimibe-simvastatin (VYTORIN) 10-40 MG tablet TAKE ONE TABLET BY MOUTH DAILY 30 tablet 2  . guaiFENesin (MUCINEX) 600 MG 12 hr tablet Take 600 mg by  mouth every evening.     Marland Kitchen ipratropium-albuterol (DUONEB) 0.5-2.5 (3) MG/3ML SOLN Take 3 mLs by nebulization as needed. (Patient taking differently: Take 3 mLs by nebulization as needed (shortness of breath). ) 360 mL 11  . levothyroxine (SYNTHROID, LEVOTHROID) 25 MCG tablet TAKE 1.5 TABLET BY ORAL ROUTE EVERY DAY.M-F, 2 TABS ON SAT /SUN 148 tablet 0  . loratadine (CLARITIN) 10 MG tablet Take 10 mg by mouth daily. Reported on 01/03/2016    . Multiple Vitamins-Minerals (CENTRUM SILVER PO) Take 1 tablet by mouth daily.    . Multiple Vitamins-Minerals (OCUVITE PRESERVISION PO) Take 1 tablet by mouth 2 (two) times daily.    . Omega-3 Fatty Acids (FISH OIL) 1000 MG CAPS Take by mouth.    . sitaGLIPtin-metformin (JANUMET) 50-500 MG tablet Take 1 tablet by  mouth daily.     No current facility-administered medications for this visit.     REVIEW OF SYSTEMS:  [X]  denotes positive finding, [ ]  denotes negative finding Cardiac  Comments:  Chest pain or chest pressure:    Shortness of breath upon exertion: X   Short of breath when lying flat:    Irregular heart rhythm:        Vascular    Pain in calf, thigh, or hip brought on by ambulation:    Pain in feet at night that wakes you up from your sleep:     Blood clot in your veins:    Leg swelling:         Pulmonary    Oxygen at home:    Productive cough:     Wheezing:         Neurologic    Sudden weakness in arms or legs:     Sudden numbness in arms or legs:     Sudden onset of difficulty speaking or slurred speech:    Temporary loss of vision in one eye:     Problems with dizziness:         Gastrointestinal    Blood in stool:     Vomited blood:         Genitourinary    Burning when urinating:     Blood in urine:        Psychiatric    Major depression:         Hematologic    Bleeding problems:    Problems with blood clotting too easily:        Skin    Rashes or ulcers:        Constitutional    Fever or chills:     PHYSICAL EXAM:   Vitals:   06/30/17 1119  BP: 139/72  Pulse: 77  SpO2: 97%  Weight: 155 lb (70.3 kg)  Height: 5\' 6"  (1.676 m)    GENERAL: The patient is a well-nourished male, in no acute distress. The vital signs are documented above. CARDIAC: There is a regular rate and rhythm.  VASCULAR: I do not detect carotid bruits. On the right side he has a palpable femoral pulse and a palpable dorsalis pedis and posterior tibial pulse. On the left side he has a palpable femoral pulse. I cannot palpate pedal pulses. He has moderate left lower extremity swelling with some mild cellulitis. PULMONARY: There is good air exchange bilaterally without wheezing or rales. ABDOMEN: Soft and non-tender with normal pitched bowel sounds.  MUSCULOSKELETAL: He has had a  previous right fifth toe amputation. NEUROLOGIC: No focal weakness or paresthesias are detected. SKIN: He has mild  cellulitis of the left lower extremity. PSYCHIATRIC: The patient has a normal affect.  DATA:    ARTERIAL DOPPLER STUDY: I have independently interpreted his arterial Doppler study today. ABI on the right is 100%. Toe pressure on the right is 81 mmHg. ABI on the left is 52%. Toe pressure on the left is 57 mmHg.  ARTERIAL DUPLEX: Duplex of his bypass graft shows that the bypass graft is widely patent without evidence of stenosis.  CAROTID DUPLEX: His right carotid endarterectomy site is widely patent without evidence of restenosis. The left carotid stent is also patent without significant stenosis.  MEDICAL ISSUES:   CELLULITIS LEFT LOWER EXTREMITY: This is a new problem. I think his cellulitis is likely related to his history of chronic venous insufficiency. I have recommended that he elevate his leg as much as he will tolerate. Given that he has peripheral vascular disease on the left with an ABI 52% I explained that this may cause him to have some rest pain if he elevates his leg too much. Based on his exam he has evidence of infrainguinal arterial occlusive disease. If his cellulitis worsens that I think he could should be considered for an arteriogram. I'm a little reluctant to cannulate the right groin given that he has a functioning bypass graft there although that would be an option.  PERIPHERAL VASCULAR DISEASE: His right femoropopliteal bypass graft is patent with no problems on the right leg. As noted above, he does have some issues with cellulitis and chronic venous insufficiency on the left and if his cellulitis returns or becomes a bigger issue that I think we need to consider arteriography on the left. He is a fairly frail 83 year old saliva do not think is a good candidate for a bypass but perhaps he would be a candidate for endovascular option if he does ultimately  require an arteriogram. He is not a smoker. He is on Plavix.  STATUS POST RIGHT CAROTID ENDARTERECTOMY AND LEFT CAROTID STENT: Both carotids are widely patent without evidence of restenosis. I have ordered a follow up carotid duplex scan in 1 year and I will see him back at that time. He is on Plavix. He is not a smoker.  Deitra Mayo [facility] [phone]

## 2017-07-01 NOTE — Addendum Note (Signed)
Addended by: Lianne Cure A on: 07/01/2017 12:58 PM   Modules accepted: Orders

## 2017-07-02 ENCOUNTER — Other Ambulatory Visit: Payer: Self-pay | Admitting: Family Medicine

## 2017-07-02 ENCOUNTER — Telehealth: Payer: Self-pay

## 2017-07-02 NOTE — Telephone Encounter (Signed)
Saw the note from vascular- continue as is.  Keep 08/2017 OV here as scheduled.

## 2017-07-02 NOTE — Telephone Encounter (Signed)
Mrs Aguino left v/m; pt seen 06/24/17; presently leg is doing a lot better; still slightly red; pt was seen 06/30/17 by Dr Scot Dock and had test done and that was good; pt is to f/u with Dr Scot Dock in 6 months due to some blockage in his leg. FYI to Dr Damita Dunnings.

## 2017-07-02 NOTE — Telephone Encounter (Signed)
Wife advised. 

## 2017-07-27 ENCOUNTER — Other Ambulatory Visit: Payer: Self-pay | Admitting: Family Medicine

## 2017-08-12 ENCOUNTER — Encounter (INDEPENDENT_AMBULATORY_CARE_PROVIDER_SITE_OTHER): Payer: Medicare Other | Admitting: Ophthalmology

## 2017-08-17 ENCOUNTER — Other Ambulatory Visit (INDEPENDENT_AMBULATORY_CARE_PROVIDER_SITE_OTHER): Payer: Medicare Other

## 2017-08-17 DIAGNOSIS — E1169 Type 2 diabetes mellitus with other specified complication: Secondary | ICD-10-CM

## 2017-08-17 LAB — HEMOGLOBIN A1C: Hgb A1c MFr Bld: 6.4 % (ref 4.6–6.5)

## 2017-08-20 ENCOUNTER — Ambulatory Visit (INDEPENDENT_AMBULATORY_CARE_PROVIDER_SITE_OTHER): Payer: Medicare Other | Admitting: Family Medicine

## 2017-08-20 ENCOUNTER — Encounter: Payer: Self-pay | Admitting: Family Medicine

## 2017-08-20 VITALS — BP 102/72 | HR 77 | Temp 97.5°F | Wt 156.2 lb

## 2017-08-20 DIAGNOSIS — E1159 Type 2 diabetes mellitus with other circulatory complications: Secondary | ICD-10-CM

## 2017-08-20 MED ORDER — BENZONATATE 200 MG PO CAPS: 200.0000 mg | ORAL_CAPSULE | Freq: Three times a day (TID) | ORAL | 3 refills | Status: DC | PRN

## 2017-08-20 NOTE — Progress Notes (Signed)
Diabetes:  Using medications without difficulties:yes Hypoglycemic episodes:no sx Hyperglycemic episodes: no sx Feet problems: cellulitis resolved . Blood Sugars averaging: not checked often eye exam within last year: yes A1c higher as expected on lower dose of meds, d/w pt.  50/500 janumet daily now.    L lower back ttp with muscle spasms, had been using a brace with some relief, per Dr. Lorin Mercy.  Hasn't used heat recently.  D/w pt.    He needed refill on tessalon. Used prn.  rx sent.    PMH and SH reviewed  Meds, vitals, and allergies reviewed.   ROS: Per HPI unless specifically indicated in ROS section   GEN: nad, alert and oriented HEENT: mucous membranes moist NECK: supple w/o LA CV: rrr. PULM: ctab, no inc wob ABD: soft, +bs EXT: no edema SKIN: no acute rash but chronic changes on LLE but not active cellulitis.    Diabetic foot exam: Normal inspection No skin breakdown No calluses  DP pulses intact Normal sensation to light touch and monofilament Nails thickened

## 2017-08-20 NOTE — Patient Instructions (Signed)
Try a compression stocking on the left leg.  Use heat and the back brace.  Recheck labs before a visit in about 6 months.  Update me sooner if needed.  Take care.  Glad to see you.

## 2017-08-22 NOTE — Assessment & Plan Note (Addendum)
A1c higher as expected on lower dose of meds, d/w pt.  50/500mg  janumet daily now.   Goal to avoid hypoglycemia. Rationale discussed with patient. Continue med as is. Recheck in about 6 months, sooner if needed. He does have some chronic changes on the left lower extremity but no active cellulitis at this point. Discussed with patient about using compression stocking as this may be beneficial. Discussed with him about foot care in general. He will continue to use his lower back brace per recommendation of Dr. Lorin Mercy. >25 minutes spent in face to face time with patient, >50% spent in counselling or coordination of care.

## 2017-08-23 ENCOUNTER — Encounter: Payer: Self-pay | Admitting: Family Medicine

## 2017-08-23 ENCOUNTER — Other Ambulatory Visit: Payer: Self-pay | Admitting: *Deleted

## 2017-08-23 MED ORDER — BENZONATATE 200 MG PO CAPS: 200.0000 mg | ORAL_CAPSULE | Freq: Three times a day (TID) | ORAL | 3 refills | Status: DC | PRN

## 2017-08-23 NOTE — Telephone Encounter (Signed)
Mrs Moss wanting to know if benzonatate had been recent to [place]. Advised Mrs troiani yes has been sent.

## 2017-09-15 ENCOUNTER — Ambulatory Visit: Payer: Medicare Other | Admitting: Nurse Practitioner

## 2017-09-22 ENCOUNTER — Ambulatory Visit: Payer: Medicare Other | Admitting: Nurse Practitioner

## 2017-09-29 ENCOUNTER — Encounter: Payer: Self-pay | Admitting: Family Medicine

## 2017-09-29 ENCOUNTER — Other Ambulatory Visit: Payer: Self-pay | Admitting: Family Medicine

## 2017-10-01 ENCOUNTER — Encounter: Payer: Self-pay | Admitting: Family Medicine

## 2017-10-02 ENCOUNTER — Other Ambulatory Visit: Payer: Self-pay | Admitting: Family Medicine

## 2017-10-04 ENCOUNTER — Other Ambulatory Visit: Payer: Self-pay | Admitting: *Deleted

## 2017-10-07 ENCOUNTER — Other Ambulatory Visit: Payer: Self-pay | Admitting: Family Medicine

## 2017-10-07 MED ORDER — ATENOLOL-CHLORTHALIDONE 50-25 MG PO TABS: ORAL_TABLET | ORAL | 3 refills | Status: DC

## 2017-10-09 ENCOUNTER — Other Ambulatory Visit: Payer: Self-pay | Admitting: Family Medicine

## 2017-10-11 ENCOUNTER — Encounter: Payer: Self-pay | Admitting: Emergency Medicine

## 2017-10-11 ENCOUNTER — Ambulatory Visit: Payer: Medicare Other | Admitting: Emergency Medicine

## 2017-10-11 DIAGNOSIS — J449 Chronic obstructive pulmonary disease, unspecified: Secondary | ICD-10-CM | POA: Diagnosis not present

## 2017-10-11 DIAGNOSIS — R059 Cough, unspecified: Secondary | ICD-10-CM

## 2017-10-11 DIAGNOSIS — R05 Cough: Secondary | ICD-10-CM

## 2017-10-11 NOTE — Assessment & Plan Note (Signed)
He describes more difficulty with cough.  He is having proxisms until he is able to clear some thick white secretions.  His wife increased his Mucinex to twice a day and I agree with this.  He continues to use loratadine effectively.  I wonder whether he is under using his DuoNeb, I have recommended today that he use it to assist with secretion management in addition to dyspnea.

## 2017-10-11 NOTE — Patient Instructions (Signed)
Please continue loratadine 10 mg daily Please continue Mucinex twice a day Keep DuoNeb available to use if needed for shortness of breath, spells of coughing and difficulty clearing your mucus. Flu shot up-to-date Pneumovax and Prevnar 13 pneumonia shots are up-to-date Follow with Dr Lamonte Sakai in 6 months or sooner if you have any problems

## 2017-10-11 NOTE — Assessment & Plan Note (Signed)
On DuoNeb as needed.  He does not like to use it and therefore minimizes.  Discussed being a bit more liberal with its use, especially with symptoms of dyspnea, cough.  Flu shot and pneumonia shots are up-to-date

## 2017-10-11 NOTE — Progress Notes (Signed)
  Subjective:    Patient ID: Dylan Knight, male    DOB: 1924/06/27, 83 y.o.   MRN: [id number]  HPI 83 yo former smoker, 60+ pk-yrs, hx DM, PVD (fem bypass), AAA with stenting, HTN, CVA '05. Was dx with COPD in 12/13 when he was admitted to [facility] for CAP and a flare. He was discharged on Advair and combivent, but he didn't stay on these - poor coordination taking HFA's. He has had many episodes of PNA vs AE, averages about once a year. Has been admitted 3 times in the past. Spirometry 2014 with moderately severe obstruction. Last seen 09/30/16. He has been managed on DuoNeb when necessary. He uses rarely. He reports that his breathing has been stable. His exertion is not being limited by breathing. He is using mucinex, loratadine.   ROV 10/11/17 --this is a follow-up visit.  The patient has a history of COPD, frequent exacerbations.  Also with allergic rhinitis for which she has been treated with loratadine. He is still able to ambulate but is having some balance difficulty. He is not having dyspnea. His cough is a bit worse than last time. Small amt white mucous. Remains on mucinex. He rarely uses duoneb, unclear that it helps him. Flu shot up to date. Pneumovax and Prevnar 13 up to date.      Objective:   Physical Exam  Vitals:   10/11/17 1131  BP: 124/78  Pulse: 77  SpO2: 92%  Weight: 158 lb (71.7 kg)  Height: 5\' 6"  (1.676 m)   Gen: Pleasant, elderly man, in no distress,  normal affect  ENT: No lesions,  mouth clear,  oropharynx clear, no postnasal drip  Neck: No JVD, no TMG, no carotid bruits  Lungs: No use of accessory muscles, distant, clear without rales or rhonchi  Cardiovascular: RRR, heart sounds normal, no murmur or gallops, no peripheral edema  Musculoskeletal: No deformities, no cyanosis or clubbing  Neuro: alert, non focal  Skin: Warm, no lesions or rash     Assessment & Plan:  No problem-specific Assessment & Plan notes found for this encounter.  Baltazar Apo, MD, PhD 10/11/2017, 11:42 AM [facility] [phone] or if no answer [phone]

## 2017-10-28 ENCOUNTER — Encounter (INDEPENDENT_AMBULATORY_CARE_PROVIDER_SITE_OTHER): Payer: Medicare Other | Admitting: Ophthalmology

## 2017-10-28 DIAGNOSIS — H348112 Central retinal vein occlusion, right eye, stable: Secondary | ICD-10-CM

## 2017-10-28 DIAGNOSIS — H35033 Hypertensive retinopathy, bilateral: Secondary | ICD-10-CM | POA: Diagnosis not present

## 2017-10-28 DIAGNOSIS — I10 Essential (primary) hypertension: Secondary | ICD-10-CM

## 2017-10-28 DIAGNOSIS — H353221 Exudative age-related macular degeneration, left eye, with active choroidal neovascularization: Secondary | ICD-10-CM

## 2017-10-28 DIAGNOSIS — H43813 Vitreous degeneration, bilateral: Secondary | ICD-10-CM | POA: Diagnosis not present

## 2017-11-04 ENCOUNTER — Emergency Department
Admission: EM | Admit: 2017-11-04 | Discharge: 2017-11-05 | Disposition: A | Discharge status: Home / Self Care | Payer: Medicare Other | Attending: Emergency Medicine | Admitting: Emergency Medicine

## 2017-11-04 ENCOUNTER — Encounter: Payer: Self-pay | Admitting: Emergency Medicine

## 2017-11-04 ENCOUNTER — Emergency Department: Payer: Medicare Other

## 2017-11-04 DIAGNOSIS — Y999 Unspecified external cause status: Secondary | ICD-10-CM | POA: Diagnosis not present

## 2017-11-04 DIAGNOSIS — Y929 Unspecified place or not applicable: Secondary | ICD-10-CM | POA: Diagnosis not present

## 2017-11-04 DIAGNOSIS — E039 Hypothyroidism, unspecified: Secondary | ICD-10-CM | POA: Diagnosis not present

## 2017-11-04 DIAGNOSIS — Z8673 Personal history of transient ischemic attack (TIA), and cerebral infarction without residual deficits: Secondary | ICD-10-CM | POA: Insufficient documentation

## 2017-11-04 DIAGNOSIS — I10 Essential (primary) hypertension: Secondary | ICD-10-CM | POA: Insufficient documentation

## 2017-11-04 DIAGNOSIS — Z87891 Personal history of nicotine dependence: Secondary | ICD-10-CM | POA: Diagnosis not present

## 2017-11-04 DIAGNOSIS — Z89421 Acquired absence of other right toe(s): Secondary | ICD-10-CM | POA: Insufficient documentation

## 2017-11-04 DIAGNOSIS — Y939 Activity, unspecified: Secondary | ICD-10-CM | POA: Diagnosis not present

## 2017-11-04 DIAGNOSIS — E1151 Type 2 diabetes mellitus with diabetic peripheral angiopathy without gangrene: Secondary | ICD-10-CM | POA: Insufficient documentation

## 2017-11-04 DIAGNOSIS — Z79899 Other long term (current) drug therapy: Secondary | ICD-10-CM | POA: Diagnosis not present

## 2017-11-04 DIAGNOSIS — I2510 Atherosclerotic heart disease of native coronary artery without angina pectoris: Secondary | ICD-10-CM | POA: Diagnosis not present

## 2017-11-04 DIAGNOSIS — Z7984 Long term (current) use of oral hypoglycemic drugs: Secondary | ICD-10-CM | POA: Diagnosis not present

## 2017-11-04 DIAGNOSIS — Z7901 Long term (current) use of anticoagulants: Secondary | ICD-10-CM | POA: Insufficient documentation

## 2017-11-04 DIAGNOSIS — I714 Abdominal aortic aneurysm, without rupture: Secondary | ICD-10-CM | POA: Insufficient documentation

## 2017-11-04 DIAGNOSIS — S61412A Laceration without foreign body of left hand, initial encounter: Secondary | ICD-10-CM | POA: Insufficient documentation

## 2017-11-04 DIAGNOSIS — W182XXA Fall in (into) shower or empty bathtub, initial encounter: Secondary | ICD-10-CM | POA: Insufficient documentation

## 2017-11-04 MED ORDER — LIDOCAINE HCL (PF) 1 % IJ SOLN
INTRAMUSCULAR | Status: AC
  Administered 2017-11-04: 5 mL
  Filled 2017-11-04: qty 5

## 2017-11-04 NOTE — Discharge Instructions (Signed)
Follow-up with Dr. Damita Dunnings for suture removal in 7-10 days, try and keep the area as dry and clean as possible, when you change the bandage you can gently wash the area with soap and water, if the area continues to bleed elevate the hand and apply pressure, if you are worsening please return to the emergency department, watch for signs of infection such as redness, pus, or increased pain and swelling, if you have any problems return to the emergency department or see your primary care doctor

## 2017-11-04 NOTE — ED Triage Notes (Addendum)
Pt arrived via EMS post mechanical fall otw to bathroom. Pt has approximate 3inch laceration on the top of left hand. Pt on Plavix, laceration bleeding. New bandage applied with pressure. Pt denies LOC and hitting head.

## 2017-11-04 NOTE — ED Notes (Signed)
Pt went to CT

## 2017-11-04 NOTE — ED Provider Notes (Signed)
[facility] Emergency Department Provider Note  ____________________________________________   First MD Initiated Contact with Patient 11/04/17 2212     (approximate)  I have reviewed the triage vital signs and the nursing notes.   HISTORY  Chief Complaint Fall and Laceration    HPI Dylan Knight is a 83 y.o. male who fell today, he states he was holding his walker but fell backwards into the shower, and cut his hand, he is concerned because the hand is still bleeding, he is on Plavix, he denies a headache at this time, he denies back pain, he states he has history of chronic problems with his back but no new injury, he denies loss of consciousness, his tetanus is up-to-date  Past Medical History:  Diagnosis Date  . Arthritis    left hip  . BPH (benign prostatic hyperplasia)   . Cancer [facility])    Skin cancers removed  . Carotid artery occlusion   . COPD (chronic obstructive pulmonary disease) ([place])   . Diabetes mellitus without complication ([place])   . Hyperlipidemia   . Hypertension   . Hypothyroidism   . Macular degeneration   . Macular degeneration   . Peripheral arterial disease ([place]) 2002  . Stroke [facility]) 2005    Patient Active Problem List   Diagnosis Date Noted  . Gait abnormality 05/19/2017  . Cellulitis 05/06/2017  . Hyperlipemia 03/29/2017  . HTN (hypertension) 03/29/2017  . Advance care planning 02/25/2017  . Macular degeneration 02/25/2017  . Transient speech disturbance   . Bilateral carotid artery stenosis   . History of stroke   . TIA (transient ischemic attack) 01/30/2017  . Acute ischemic stroke ([place]) 01/30/2017  . Low back pain with right-sided sciatica 12/01/2016  . Diabetes mellitus type 2, controlled ([place]) 12/01/2016  . CAD (coronary artery disease) 12/01/2016  . Hypothyroidism 12/01/2016  . Back pain 11/02/2016  . Aftercare following surgery of the circulatory system, [place] 06/28/2013  . Cough 04/18/2013  . COPD  (chronic obstructive pulmonary disease) ([place]) 11/22/2012  . Occlusion and stenosis of carotid artery without mention of cerebral infarction 06/29/2012  . Peripheral vascular disease ([place]) 12/30/2011    Past Surgical History:  Procedure Laterality Date  . ABDOMINAL AORTIC ANEURYSM REPAIR  2003   Aorto-left femoral-right iliac BPG by Dr. Scot Dock  . Aortic aneursym surgery     2003- Dr. Scot Dock  . BACK SURGERY     1960  . CAROTID ENDARTERECTOMY  Jan. 25,2007   Right CEA  . Carotid stenosis surgery on right     2007  . CARPAL TUNNEL RELEASE Left 06/02/2016   Procedure: LEFT CARPAL TUNNEL RELEASE;  Surgeon: Daryll Brod, MD;  Location: [place];  Service: Orthopedics;  Laterality: Left;  . EYE SURGERY     Catarct surgery and lens implant - bilateral  . PR VEIN BYPASS GRAFT,AORTO-FEM-POP  2005   Revision Right Fem-pop  . PR VEIN BYPASS GRAFT,AORTO-FEM-POP  2002   Right Fem-pop  . RADIOLOGY WITH ANESTHESIA N/A 12/02/2016   Procedure: MRI LUMBAR SPINE WITHOUT;  Surgeon: Medication Radiologist, MD;  Location: [place];  Service: Radiology;  Laterality: N/A;  . Right leg bypass     2002 -  and amputation of right fifth toe  . SPINE SURGERY  1960   lumbar spine  . [place], 2007   cervical spine  . TOE AMPUTATION  08-2001   right 5th toe amputation    Prior to Admission medications  Medication Sig Start Date End Date Taking? Authorizing Provider  acetaminophen (TYLENOL) 325 MG tablet Take 650 mg by mouth every 6 (six) hours as needed for mild pain.     [provider]  albuterol (PROVENTIL HFA;VENTOLIN HFA) 108 (90 BASE) MCG/ACT inhaler Inhale 2 puffs into the lungs every 6 (six) hours as needed for wheezing or shortness of breath. 07/16/15   Collene Gobble, MD  atenolol-chlorthalidone (TENORETIC) 50-25 MG tablet TAKE 1/2 TABLET BY ORAL ROUTE EVERY DAY 10/07/17   Tonia Ghent, MD  benzonatate (TESSALON) 200 MG capsule Take 1 capsule (200 mg total) by  mouth 3 (three) times daily as needed. 08/23/17   Tonia Ghent, MD  clopidogrel (PLAVIX) 75 MG tablet TAKE 1 TABLET (75MG ) BY MOUTH EVERY DAY 07/27/17   Tonia Ghent, MD  ezetimibe-simvastatin (VYTORIN) 10-40 MG tablet TAKE ONE TABLET BY MOUTH DAILY 10/11/17   Tonia Ghent, MD  guaiFENesin (MUCINEX) 600 MG 12 hr tablet Take 600 mg by mouth every evening.     [provider]  ipratropium-albuterol (DUONEB) 0.5-2.5 (3) MG/3ML SOLN Take 3 mLs by nebulization as needed. Patient taking differently: Take 3 mLs by nebulization as needed (shortness of breath).  04/29/17   Collene Gobble, MD  levothyroxine (SYNTHROID, LEVOTHROID) 25 MCG tablet TAKE 1.5 TABLET BY MOUTH EVERY DAY ON MONDAY THROUGH FRIDAY AND 2 TABS ON SAT/SUN 09/29/17   Tonia Ghent, MD  loratadine (CLARITIN) 10 MG tablet Take 10 mg by mouth daily. Reported on 01/03/2016    [provider]  Multiple Vitamins-Minerals (CENTRUM SILVER PO) Take 1 tablet by mouth daily.    [provider]  Multiple Vitamins-Minerals (OCUVITE PRESERVISION PO) Take 1 tablet by mouth 2 (two) times daily.    [provider]  Omega-3 Fatty Acids (FISH OIL) 1000 MG CAPS Take by mouth.    [provider]  sitaGLIPtin-metformin (JANUMET) 50-500 MG tablet Take 1 tablet by mouth daily. 05/18/17   Tonia Ghent, MD    Allergies Iohexol and Iodine  Family History  Problem Relation Age of Onset  . Stroke Mother   . Hypertension Mother   . Heart disease Father   . Heart attack Father   . Cancer Brother   . Hypertension Brother     Social History Social History   Tobacco Use  . Smoking status: Former Smoker    Packs/day: 1.00    Years: 63.00    Pack years: 63.00    Types: Cigarettes    Last attempt to quit: 04/09/2004    Years since quitting: 13.5  . Smokeless tobacco: Former Systems developer  . Tobacco comment: Quit smoking in 2005  Substance Use Topics  . Alcohol use: No    Alcohol/week: 0.0 oz  . Drug  use: No    Review of Systems  Constitutional: No fever/chills Eyes: No visual changes. ENT: No sore throat. Respiratory: Denies cough Genitourinary: Negative for dysuria. Musculoskeletal: Negative for new back pain. Skin: Positive for laceration.    ____________________________________________   PHYSICAL EXAM:  VITAL SIGNS: ED Triage Vitals [11/04/17 2131]  Enc Vitals Group     BP (!) 167/84     Pulse Rate 89     Resp 16     Temp 97.7 F (36.5 C)     Temp Source Oral     SpO2 98 %     Weight 158 lb (71.7 kg)     Height      Head Circumference  Peak Flow      Pain Score      Pain Loc      Pain Edu?      Excl. in [place]?     Constitutional: Alert and oriented. Well appearing and in no acute distress.  Is able to answer all questions in an appropriate manner Eyes: Conjunctivae are normal.  Head: Atraumatic. Nose: No congestion/rhinnorhea. Mouth/Throat: Mucous membranes are moist.   Cardiovascular: Normal rate, regular rhythm.  Heart sounds are normal Respiratory: Normal respiratory effort.  No retractions, lungs are clear to auscultation GU: deferred Musculoskeletal: FROM all extremities, warm and well perfused, left hand has a large laceration,  Positive for active bleeding Neurologic:  Normal speech and language.  Skin:  Skin is warm, dry.  Positive for a 2-3 inch laceration on the top of the left hand, the area is tender Psychiatric: Mood and affect are normal. Speech and behavior are normal.  ____________________________________________   LABS (all labs ordered are listed, but only abnormal results are displayed)  Labs Reviewed - No data to display ____________________________________________   ____________________________________________  RADIOLOGY  CT of the head neg for intracranial hemorrhage X-ray of the left hand is negative for acute fracture  ____________________________________________   PROCEDURES  Procedure(s) performed: surgicell  applied, laceration repair by dr brown The patient gave consent to have the area sutured The area was cleaned and draped, 1% Xylocaine was used as a local, 5-0 Ethilon for 10 simple sutures, no foreign body was noted, Surgicel was applied on top of the sutures due to the amount of bleeding that is still present, the patient was neurovascularly intact after the procedure, a large pressure bandage was applied by the nurse    ____________________________________________   INITIAL IMPRESSION / [place] / ED COURSE  Pertinent labs & imaging results that were available during my care of the patient were reviewed by me and considered in my medical decision making (see chart for details).  Patient is a 83 year old male who presents to the ED after a fall, he states he was holding onto his walker but fell backwards into the walk-in shower, he denies hitting his head, but he has a large laceration to hand, he takes Plavix, on physical exam the area is still oozing and the frankly bleeding, pressure dressing was applied, the area continued to bleed, applied Surgicel with a mild decrease in the bleeding, Dr. Owens Shark was in to evaluate  Surgicel was removed and the area is still bleeding, while discussing it with Dr. Owens Shark decision was made to attempt suturing due to the amount of bleeding, we discussed the chance that the sutures would not hold with the family, they state they understand, Dr. Owens Shark performed the laceration repair    ----------------------------------------- 11:53 PM on 11/04/2017 -----------------------------------------  CT of the head is negative for intracranial hemorrhage, x-ray of the left hand is negative for fracture   Dr. Owens Shark performed the laceration repair to the hand, 10 simple sutures were inserted with 5-0 Ethilon, a bandage was applied by the nurse, the patient is to follow-up with his regular doctor in 7-10 days for suture removal, care of the sutures and the  wound was explained to the patient and his wife, they are to gently wash the area with soap and water once they remove the bandage, if the area starts to bleed they are to elevate it and apply pressure, if they are having any issues with wound care they should see their regular doctor, if  there is redness, pus, or swelling they should return to the emergency department for evaluation, if he is worsening they should return to the emergency department for evaluation   ____________________________________________   FINAL CLINICAL IMPRESSION(S) / ED DIAGNOSES  Final diagnoses:  Laceration of left hand without foreign body, initial encounter      NEW MEDICATIONS STARTED DURING THIS VISIT:  This SmartLink is deprecated. Use AVSMEDLIST instead to display the medication list for a patient.   Note:  This document was prepared using Dragon voice recognition software and may include unintentional dictation errors.    Versie Starks, PA-C 11/04/17 2339    Versie Starks, PA-C 11/04/17 2356    Harvest Dark, MD 11/05/17 2146

## 2017-11-12 ENCOUNTER — Encounter: Payer: Self-pay | Admitting: Family Medicine

## 2017-11-12 ENCOUNTER — Ambulatory Visit: Payer: Medicare Other | Admitting: Family Medicine

## 2017-11-12 DIAGNOSIS — Z4802 Encounter for removal of sutures: Secondary | ICD-10-CM

## 2017-11-12 NOTE — Patient Instructions (Addendum)
Restart plavix when not bleeding.   The steristrips should wear off.  You can trim the ends as needed.  Take care.  Glad to see you.

## 2017-11-13 ENCOUNTER — Ambulatory Visit: Payer: Medicare Other | Admitting: Family Medicine

## 2017-11-13 ENCOUNTER — Encounter: Payer: Self-pay | Admitting: Family Medicine

## 2017-11-13 DIAGNOSIS — S61412D Laceration without foreign body of left hand, subsequent encounter: Secondary | ICD-10-CM

## 2017-11-13 DIAGNOSIS — S61412A Laceration without foreign body of left hand, initial encounter: Secondary | ICD-10-CM | POA: Insufficient documentation

## 2017-11-13 NOTE — Progress Notes (Signed)
   Subjective:    Patient ID: Dylan Knight, male    DOB: Mar 31, 1924, 84 y.o.   MRN: [id number]  HPI   84 year old male presents  Following laceration on left hand on 12/27.  Went to ED..10 sutures place.   Yesterday seen by PCP Damita Dunnings Sutures removed. Steristrips place.   Pt returns today given steristrips already falling off.  He was not aggressively rubbing hands.  Laceration was bleeding, oozing and open.  Review of Systems  Constitutional: Negative for fatigue and fever.  HENT: Negative for ear pain.   Eyes: Negative for pain.  Respiratory: Negative for cough and shortness of breath.   Cardiovascular: Negative for chest pain, palpitations and leg swelling.  Gastrointestinal: Negative for abdominal pain.  Genitourinary: Negative for dysuria.  Musculoskeletal: Negative for arthralgias.  Neurological: Negative for syncope, light-headedness and headaches.  Psychiatric/Behavioral: Negative for dysphoric mood.       Objective:   Physical Exam  Constitutional: Vital signs are normal. He appears well-developed and well-nourished.  HENT:  Head: Normocephalic.  Right Ear: Hearing normal.  Left Ear: Hearing normal.  Nose: Nose normal.  Mouth/Throat: Oropharynx is clear and moist and mucous membranes are normal.  Neck: Trachea normal. Carotid bruit is not present. No thyroid mass and no thyromegaly present.  Cardiovascular: Normal rate, regular rhythm and normal pulses. Exam reveals no gallop, no distant heart sounds and no friction rub.  No murmur heard. No peripheral edema  Pulmonary/Chest: Effort normal and breath sounds normal. No respiratory distress.  Skin: Skin is warm, dry and intact. No rash noted.  Left dorsal hand with open  Laceration cresent shape 5 cm in length  oozing blood  Psychiatric: He has a normal mood and affect. His speech is normal and behavior is normal. Thought content normal.          Assessment & Plan:

## 2017-11-13 NOTE — Assessment & Plan Note (Signed)
Steristrips fell off prematurely. Likely due to location of laceration at knuckles and bending of hand. As well as due to failure of sutures likely from weak skin from advanced age.   Cleaned wound with saline. No sign of infeciton.  Reapplied more steri strips with benzoin adhesive.

## 2017-11-14 DIAGNOSIS — Z4802 Encounter for removal of sutures: Secondary | ICD-10-CM | POA: Insufficient documentation

## 2017-11-14 NOTE — Progress Notes (Signed)
Follow up for suture removal.  No acute complaints other than the L hand suture removal and local bruising.    ER course d/w pt. No fevers, no drainage from the suture site.   No recently leg redness as with prev cellulitis.    Meds, vitals, and allergies reviewed.   ROS: Per HPI unless specifically indicated in ROS section   nad ncat L hand with good tissue apposition, with 10 sutures in place, all removed with magnification and then covered with steristips.   NV intact, local bruising noted.

## 2017-11-14 NOTE — Assessment & Plan Note (Signed)
10 removed, no complications.  Covered with steristrips with routine cautions.  Update [place] as needed.  He agrees.

## 2017-11-15 ENCOUNTER — Telehealth: Payer: Self-pay

## 2017-11-15 NOTE — Telephone Encounter (Signed)
Per chart review tab pt seen Sat clinic 11/13/17.

## 2017-11-15 NOTE — Telephone Encounter (Signed)
PLEASE NOTE: All timestamps contained within this report are represented as [place] Standard Time. CONFIDENTIALTY NOTICE: This fax transmission is intended only for the addressee. It contains information that is legally privileged, confidential or otherwise protected from use or disclosure. If you are not the intended recipient, you are strictly prohibited from reviewing, disclosing, copying using or disseminating any of this information or taking any action in reliance on or regarding this information. If you have received this fax in error, please notify us immediately by telephone so that we can arrange for its return to [place]. Phone: [phone], Toll-Free: [phone], Fax: [phone] Page: 1 of 2 Call Id: [id number] [place] Patient Name: Dylan Knight Gender: Male DOB: Feb 20, 1924 Age: 84 Y 10 M 23 D Return Phone Number: [phone] (Primary) Address: City/State/Zip: [place] [address] Client [place] Night - Client Client Site [place] Physician Renford Dills - MD Contact Type Call Who Is Calling Patient / Member / Family / Caregiver Call Type Triage / Clinical Caller Name Serafin [place] Relationship To Patient Spouse Return Phone Number [phone] (Primary) Chief Complaint Post-op Incision Symptoms Reason for Call Symptomatic / Request for Health Information Initial Comment caller was in the office yesterday for stitches removed, had strips put on and they didn't hold on hand, ripped open, bleeding, redness. on the knuckle Translation No Nurse Assessment Nurse: Markus Daft, RN, Sherre Poot Date/Time (Eastern Time): 11/13/2017 9:02:11 AM Confirm and document reason for call. If symptomatic, describe symptoms. ---Caller was in the office yesterday for stitches removed from knuckle index finger of left hand. He had strips put on and  they didn't hold on hand, wound ripped open, bleeding like oozing, redness around the incision. The whole hand is still red, but not inflamed red - this is new. Does the patient have any new or worsening symptoms? ---Yes Will a triage be completed? ---Yes Related visit to physician within the last 2 weeks? ---Yes Does the PT have any chronic conditions? (i.e. diabetes, asthma, etc.) ---Yes List chronic conditions. ---diabetic, CVA, COPD Is this a behavioral health or substance abuse call? ---No Guidelines Guideline Title Affirmed Question Affirmed Notes Nurse Date/Time Eilene Ghazi Time) Post-Op Incision Symptoms [1] Incision looks infected (spreading redness, pain) AND [2] large red area (> 2 in. or 5 cm) Markus Daft, RN, [place] 11/13/2017 9:04:39 AM Disp. Time Eilene Ghazi Time) Disposition Final User PLEASE NOTE: All timestamps contained within this report are represented as [place] Standard Time. CONFIDENTIALTY NOTICE: This fax transmission is intended only for the addressee. It contains information that is legally privileged, confidential or otherwise protected from use or disclosure. If you are not the intended recipient, you are strictly prohibited from reviewing, disclosing, copying using or disseminating any of this information or taking any action in reliance on or regarding this information. If you have received this fax in error, please notify us immediately by telephone so that we can arrange for its return to [place]. Phone: [phone], Toll-Free: [phone], Fax: [phone] Page: 2 of 2 Call Id: [id number] 11/13/2017 9:07:17 AM See Physician within 4 Hours (or PCP triage) Yes Markus Daft, RN, Kenton Kingfisher Disagree/Comply Comply Caller Understands Yes PreDisposition Go to Urgent Care/Walk-In Clinic Care Advice Given Per Guideline SEE PHYSICIAN WITHIN 4 HOURS (or PCP triage): * IF OFFICE WILL BE OPEN: You need to be seen within the next 3 or 4 hours. Call your doctor's office now or as  soon as  it opens. CALL BACK IF: * You become worse. CARE ADVICE per Post-Op Incision Symptoms (Adult) guideline. Referrals [facility]

## 2017-12-05 ENCOUNTER — Encounter: Payer: Self-pay | Admitting: Family Medicine

## 2017-12-06 ENCOUNTER — Other Ambulatory Visit: Payer: Self-pay | Admitting: *Deleted

## 2017-12-06 MED ORDER — SITAGLIPTIN PHOS-METFORMIN HCL 50-500 MG PO TABS: 1.0000 | ORAL_TABLET | Freq: Every day | ORAL | 1 refills | Status: DC

## 2017-12-26 ENCOUNTER — Other Ambulatory Visit: Payer: Self-pay | Admitting: Family Medicine

## 2017-12-27 NOTE — Telephone Encounter (Signed)
Electronic refill request Last refill 08/23/17 #30/3 Last office visit 11/13/17/acute

## 2017-12-28 NOTE — Telephone Encounter (Signed)
Sent.  Thanks.  If cough isn't getting better then needs f/u.

## 2017-12-28 NOTE — Telephone Encounter (Signed)
Wife advised and appointment scheduled.

## 2017-12-30 ENCOUNTER — Encounter: Payer: Self-pay | Admitting: Family Medicine

## 2017-12-30 ENCOUNTER — Ambulatory Visit: Payer: Medicare Other | Admitting: Family Medicine

## 2017-12-30 DIAGNOSIS — R269 Unspecified abnormalities of gait and mobility: Secondary | ICD-10-CM

## 2017-12-30 DIAGNOSIS — R05 Cough: Secondary | ICD-10-CM | POA: Diagnosis not present

## 2017-12-30 DIAGNOSIS — R059 Cough, unspecified: Secondary | ICD-10-CM

## 2017-12-30 MED ORDER — IPRATROPIUM-ALBUTEROL 0.5-2.5 (3) MG/3ML IN SOLN: 3.0000 mL | Freq: Three times a day (TID) | RESPIRATORY_TRACT | 11 refills | Status: DC | PRN

## 2017-12-30 NOTE — Progress Notes (Signed)
His L hand has been slow to heal.  It is still tender locally but slowly getting better.    Still with cough.  A small amount of thick white sputum.  He hasn't used neb tx recently.  He has it to use but not used recently.  He has used tessalon, prn, with relief.    He is using a rollator.  He has a twist in his torso to the R, with chronic postural changes noted.   Meds, vitals, and allergies reviewed.   ROS: Per HPI unless specifically indicated in ROS section   GEN: nad, alert and oriented HEENT: mucous membranes moist, nasal exam w/o erythema, clear discharge noted,  OP with cobblestoning NECK: supple w/o LA CV: rrr.   PULM: ctab, no inc wob, some occ rhonchi. No wheeze.  EXT: trace L>R edema at baseline.  He has a twist in his torso to the R, with chronic postural changes noted.

## 2017-12-30 NOTE — Patient Instructions (Signed)
Keep using your walker and restart the nebulizer.  Continue mucinex with plenty of water.  Update me in a few days about your breathing and cough.  Take care.  Glad to see you.

## 2018-01-02 NOTE — Assessment & Plan Note (Signed)
D/w pt about restarting nebulizer.  Continue mucinex with plenty of water.  Update me in a few days about his breathing and cough.  Okay for outpatient follow-up. Continue tessalon prn.

## 2018-01-02 NOTE — Assessment & Plan Note (Signed)
He has a twist in his torso to the R, with chronic postural changes noted.  Would continue using rollator.  D/w pt.  He agrees.

## 2018-01-04 ENCOUNTER — Encounter: Payer: Self-pay | Admitting: Family Medicine

## 2018-01-05 ENCOUNTER — Other Ambulatory Visit: Payer: Self-pay

## 2018-01-05 ENCOUNTER — Encounter: Payer: Self-pay | Admitting: Vascular Surgery

## 2018-01-05 ENCOUNTER — Ambulatory Visit (HOSPITAL_COMMUNITY)
Admission: RE | Admit: 2018-01-05 | Discharge: 2018-01-05 | Disposition: A | Discharge status: Home / Self Care | Payer: Medicare Other | Source: Ambulatory Visit | Attending: Vascular Surgery | Admitting: Vascular Surgery

## 2018-01-05 ENCOUNTER — Ambulatory Visit: Payer: Medicare Other | Admitting: Vascular Surgery

## 2018-01-05 VITALS — BP 132/78 | HR 80 | Temp 96.7°F | Resp 18 | Ht 66.0 in | Wt 158.0 lb

## 2018-01-05 DIAGNOSIS — I779 Disorder of arteries and arterioles, unspecified: Secondary | ICD-10-CM

## 2018-01-05 DIAGNOSIS — I739 Peripheral vascular disease, unspecified: Secondary | ICD-10-CM | POA: Insufficient documentation

## 2018-01-05 DIAGNOSIS — R9439 Abnormal result of other cardiovascular function study: Secondary | ICD-10-CM | POA: Insufficient documentation

## 2018-01-05 DIAGNOSIS — R600 Localized edema: Secondary | ICD-10-CM | POA: Insufficient documentation

## 2018-01-05 DIAGNOSIS — I6523 Occlusion and stenosis of bilateral carotid arteries: Secondary | ICD-10-CM

## 2018-01-05 NOTE — Progress Notes (Signed)
Vitals:   01/05/18 1146  BP: (!) 143/77  Pulse: 81  Resp: 18  Temp: (!) 96.7 F (35.9 C)  TempSrc: Oral  SpO2: 95%  Weight: 158 lb (71.7 kg)  Height: 5\' 6"  (1.676 m)

## 2018-01-05 NOTE — Progress Notes (Signed)
Patient name: Dylan Knight MRN: [id number] DOB: August 15, 1924 Sex: male  REASON FOR VISIT:   Follow-up  HPI:   Dylan Knight is a pleasant 84 y.o. male who I last saw on 06/30/2017.  He has an extensive vascular history.  He has had the following procedures:  1. He had a right carotid endarterectomy in 2007. 2. He has had a previous left carotid stent. 3. He underwent repair of an abdominal aortic aneurysm in 2003. 4. He had a right femoropopliteal bypass graft in 2005  When I saw him last he had some cellulitis in the left leg and evidence of chronic venous insufficiency.  He has evidence of infrainguinal arterial occlusive disease.  He denies any history of stroke, TIAs, expressive or receptive aphasia or amaurosis fugax.  He is on Plavix.  He is not a smoker.  Past Medical History:  Diagnosis Date  . Arthritis    left hip  . BPH (benign prostatic hyperplasia)   . Cancer [facility])    Skin cancers removed  . Carotid artery occlusion   . COPD (chronic obstructive pulmonary disease) ([place])   . Diabetes mellitus without complication ([place])   . Hyperlipidemia   . Hypertension   . Hypothyroidism   . Macular degeneration   . Macular degeneration   . Peripheral arterial disease ([place]) 2002  . Stroke [facility] 2005    Family History  Problem Relation Age of Onset  . Stroke Mother   . Hypertension Mother   . Heart disease Father   . Heart attack Father   . Cancer Brother   . Hypertension Brother     SOCIAL HISTORY: Social History   Tobacco Use  . Smoking status: Former Smoker    Packs/day: 1.00    Years: 63.00    Pack years: 63.00    Types: Cigarettes    Last attempt to quit: 04/09/2004    Years since quitting: 13.7  . Smokeless tobacco: Former Systems developer  . Tobacco comment: Quit smoking in 2005  Substance Use Topics  . Alcohol use: No    Alcohol/week: 0.0 oz    Allergies  Allergen Reactions  . Iohexol Hives and Other (See Comments)    Went away with benadryl   . Iodine  Rash    Current Outpatient Medications  Medication Sig Dispense Refill  . acetaminophen (TYLENOL) 325 MG tablet Take 650 mg by mouth every 6 (six) hours as needed for mild pain.     Marland Kitchen albuterol (PROVENTIL HFA;VENTOLIN HFA) 108 (90 BASE) MCG/ACT inhaler Inhale 2 puffs into the lungs every 6 (six) hours as needed for wheezing or shortness of breath. 1 Inhaler 6  . atenolol-chlorthalidone (TENORETIC) 50-25 MG tablet TAKE 1/2 TABLET BY ORAL ROUTE EVERY DAY 90 tablet 3  . benzonatate (TESSALON) 200 MG capsule TAKE 1 CAPSULE BY MOUTH THREE TIMES A DAY AS NEEDED 30 capsule 3  . clopidogrel (PLAVIX) 75 MG tablet TAKE 1 TABLET (75MG ) BY MOUTH EVERY DAY 90 tablet 1  . ezetimibe-simvastatin (VYTORIN) 10-40 MG tablet TAKE ONE TABLET BY MOUTH DAILY 30 tablet 3  . guaiFENesin (MUCINEX) 600 MG 12 hr tablet Take 600 mg by mouth every evening.     Marland Kitchen ipratropium-albuterol (DUONEB) 0.5-2.5 (3) MG/3ML SOLN Take 3 mLs by nebulization 3 (three) times daily as needed. 360 mL 11  . levothyroxine (SYNTHROID, LEVOTHROID) 25 MCG tablet TAKE 1.5 TABLET BY MOUTH EVERY DAY ON MONDAY THROUGH FRIDAY AND 2 TABS ON SAT/SUN 148 tablet 1  . loratadine (  CLARITIN) 10 MG tablet Take 10 mg by mouth daily. Reported on 01/03/2016    . Multiple Vitamins-Minerals (CENTRUM SILVER PO) Take 1 tablet by mouth daily.    . Multiple Vitamins-Minerals (OCUVITE PRESERVISION PO) Take 1 tablet by mouth 2 (two) times daily.    . Omega-3 Fatty Acids (FISH OIL) 1000 MG CAPS Take by mouth.    . sitaGLIPtin-metformin (JANUMET) 50-500 MG tablet Take 1 tablet by mouth daily. 90 tablet 1   No current facility-administered medications for this visit.     REVIEW OF SYSTEMS:  [X]  denotes positive finding, [ ]  denotes negative finding Cardiac  Comments:  Chest pain or chest pressure:    Shortness of breath upon exertion: x   Short of breath when lying flat:    Irregular heart rhythm:        Vascular    Pain in calf, thigh, or hip brought on by  ambulation: x  left leg  Pain in feet at night that wakes you up from your sleep:     Blood clot in your veins:    Leg swelling:         Pulmonary    Oxygen at home:    Productive cough:     Wheezing:         Neurologic    Sudden weakness in arms or legs:     Sudden numbness in arms or legs:     Sudden onset of difficulty speaking or slurred speech:    Temporary loss of vision in one eye:     Problems with dizziness:         Gastrointestinal    Blood in stool:     Vomited blood:         Genitourinary    Burning when urinating:     Blood in urine:        Psychiatric    Major depression:         Hematologic    Bleeding problems:    Problems with blood clotting too easily:        Skin    Rashes or ulcers:        Constitutional    Fever or chills:     PHYSICAL EXAM:   Vitals:   01/05/18 1146 01/05/18 1150  BP: (!) 143/77 132/78  Pulse: 81 80  Resp: 18   Temp: (!) 96.7 F (35.9 C)   TempSrc: Oral   SpO2: 95%   Weight: 158 lb (71.7 kg)   Height: 5\' 6"  (1.676 m)     GENERAL: The patient is a well-nourished male, in no acute distress. The vital signs are documented above. CARDIAC: There is a regular rate and rhythm.  VASCULAR: I do not detect carotid bruits. He has palpable femoral pulses.  I cannot palpate pedal pulses. He has mild left lower extremity swelling. PULMONARY: There is good air exchange bilaterally without wheezing or rales. ABDOMEN: Soft and non-tender with normal pitched bowel sounds.  MUSCULOSKELETAL: There are no major deformities or cyanosis. NEUROLOGIC: No focal weakness or paresthesias are detected. SKIN: There are no ulcers or rashes noted. PSYCHIATRIC: The patient has a normal affect.  DATA:    LOWER EXTREMITY ARTERIAL DOPPLER: I have independently interpreted his lower extremity arterial Doppler study.  On the right side there is a biphasic posterior tibial signal and a triphasic dorsalis pedis signal.  ABI is 98%.  On the left side  there is a monophasic dorsalis pedis signal.  ABI is  51%.  Toe pressure on the left is 60 mmHg.  MEDICAL ISSUES:   PERIPHERAL VASCULAR DISEASE: His bypass graft on the right is patent with a normal ABI of 98%.  He does have infrainguinal arterial occlusive disease on the left but has no rest pain or nonhealing ulcers.  I have encouraged him to stay as active as possible.  Fortunately he is not a smoker.  CAROTID DISEASE: The patient is undergone previous right carotid endarterectomy and left carotid stenting.  He is due for a follow-up study in 6 months and I will see him back at that time.  He is on Plavix.  He remains asymptomatic.  At that visit we will try to get his ABIs and carotid yearly follow-up studies scheduled at the same time.  Deitra Mayo [facility] [phone]

## 2018-01-19 ENCOUNTER — Other Ambulatory Visit: Payer: Self-pay | Admitting: Family Medicine

## 2018-01-19 ENCOUNTER — Other Ambulatory Visit: Payer: Self-pay

## 2018-01-19 LAB — HM DIABETES EYE EXAM

## 2018-01-19 NOTE — Telephone Encounter (Signed)
Electronic refill request Last refill 07/27/17 #$90/1 Last office visit 12/30/17

## 2018-01-20 ENCOUNTER — Encounter (INDEPENDENT_AMBULATORY_CARE_PROVIDER_SITE_OTHER): Payer: Medicare Other | Admitting: Ophthalmology

## 2018-01-20 DIAGNOSIS — I10 Essential (primary) hypertension: Secondary | ICD-10-CM

## 2018-01-20 DIAGNOSIS — H43813 Vitreous degeneration, bilateral: Secondary | ICD-10-CM | POA: Diagnosis not present

## 2018-01-20 DIAGNOSIS — H348112 Central retinal vein occlusion, right eye, stable: Secondary | ICD-10-CM

## 2018-01-20 DIAGNOSIS — H353221 Exudative age-related macular degeneration, left eye, with active choroidal neovascularization: Secondary | ICD-10-CM

## 2018-01-20 DIAGNOSIS — H35033 Hypertensive retinopathy, bilateral: Secondary | ICD-10-CM

## 2018-01-20 NOTE — Telephone Encounter (Signed)
Sent. Thanks.   

## 2018-01-25 ENCOUNTER — Encounter: Payer: Self-pay | Admitting: Family Medicine

## 2018-01-31 ENCOUNTER — Encounter (INDEPENDENT_AMBULATORY_CARE_PROVIDER_SITE_OTHER): Payer: Medicare Other | Admitting: Ophthalmology

## 2018-01-31 DIAGNOSIS — H26491 Other secondary cataract, right eye: Secondary | ICD-10-CM

## 2018-02-13 ENCOUNTER — Other Ambulatory Visit: Payer: Self-pay | Admitting: Family Medicine

## 2018-02-13 DIAGNOSIS — E1159 Type 2 diabetes mellitus with other circulatory complications: Secondary | ICD-10-CM

## 2018-02-18 ENCOUNTER — Other Ambulatory Visit (INDEPENDENT_AMBULATORY_CARE_PROVIDER_SITE_OTHER): Payer: Medicare Other

## 2018-02-18 DIAGNOSIS — E1159 Type 2 diabetes mellitus with other circulatory complications: Secondary | ICD-10-CM | POA: Diagnosis not present

## 2018-02-18 LAB — HEMOGLOBIN A1C: Hgb A1c MFr Bld: 7 % — ABNORMAL HIGH (ref 4.6–6.5)

## 2018-02-22 ENCOUNTER — Ambulatory Visit: Payer: Medicare Other | Admitting: Family Medicine

## 2018-02-22 ENCOUNTER — Encounter: Payer: Self-pay | Admitting: Family Medicine

## 2018-02-22 DIAGNOSIS — E1159 Type 2 diabetes mellitus with other circulatory complications: Secondary | ICD-10-CM | POA: Diagnosis not present

## 2018-02-22 DIAGNOSIS — J449 Chronic obstructive pulmonary disease, unspecified: Secondary | ICD-10-CM | POA: Diagnosis not present

## 2018-02-22 MED ORDER — FLUTICASONE-SALMETEROL 250-50 MCG/DOSE IN AEPB: 1.0000 | INHALATION_SPRAY | Freq: Two times a day (BID) | RESPIRATORY_TRACT | 12 refills | Status: DC

## 2018-02-22 NOTE — Progress Notes (Signed)
Diabetes:  Using medications without difficulties: yes Hypoglycemic episodes:no sx  Hyperglycemic episodes: no sx Feet problems:  See below.  Blood Sugars averaging: not checked, his meter broke.   eye exam within last year: yes Labs d/w pt.    Cough is still present.  Used neb prn, now most days, with some relief.  No sputum usually, but some of the time with sputum.  No fevers.  Some wheeze.   Not sleeping well.  Unclear trigger.  He may be napping some in the day but not a lot.  Unclear how much his cough is affecting his sleep and treatment as above may affect that.  Discussed.  Meds, vitals, and allergies reviewed.   ROS: Per HPI unless specifically indicated in ROS section   GEN: nad, alert and oriented HEENT: mucous membranes moist NECK: supple w/o LA CV: rrr. PULM: ctab, no inc wob, except for scant B exp wheeze.   ABD: soft, +bs EXT: no edema SKIN: no acute rash but chronic L leg changes noted with hyperpigmentation.    Diabetic foot exam: Normal inspection No skin breakdown No calluses  Dec but intact DP pulses Normal sensation to light touch but dec to monofilament testing.   L 1st nail prev removed, is regrowing.

## 2018-02-22 NOTE — Patient Instructions (Addendum)
Use the purple disk twice a day, rinse after use.  Use the neb machine ("the smoker") 3 times a day if needed.  Update me as needed.  Recheck at yearly visit with labs ahead of time in about 3-4 months.  Take care.  Glad to see you.

## 2018-02-24 NOTE — Assessment & Plan Note (Signed)
A1c is reasonable.  The goal is to avoid hypoglycemia.  No change in medications at this point.  Labs discussed with patient.  He agrees.

## 2018-02-24 NOTE — Assessment & Plan Note (Signed)
Reasonable to add on Advair with routine cautions, rinse after use.  He will see if that improves his symptoms in the meantime.  Still okay for outpatient follow-up.  Use ipratropium/albuterol nebulizer as needed.

## 2018-04-02 ENCOUNTER — Other Ambulatory Visit: Payer: Self-pay | Admitting: Family Medicine

## 2018-04-05 ENCOUNTER — Encounter: Payer: Self-pay | Admitting: Family Medicine

## 2018-04-14 ENCOUNTER — Encounter (INDEPENDENT_AMBULATORY_CARE_PROVIDER_SITE_OTHER): Payer: Medicare Other | Admitting: Ophthalmology

## 2018-04-14 DIAGNOSIS — H34811 Central retinal vein occlusion, right eye, with macular edema: Secondary | ICD-10-CM | POA: Diagnosis not present

## 2018-04-14 DIAGNOSIS — H43813 Vitreous degeneration, bilateral: Secondary | ICD-10-CM | POA: Diagnosis not present

## 2018-04-14 DIAGNOSIS — H35033 Hypertensive retinopathy, bilateral: Secondary | ICD-10-CM | POA: Diagnosis not present

## 2018-04-14 DIAGNOSIS — I10 Essential (primary) hypertension: Secondary | ICD-10-CM | POA: Diagnosis not present

## 2018-04-14 DIAGNOSIS — H353221 Exudative age-related macular degeneration, left eye, with active choroidal neovascularization: Secondary | ICD-10-CM | POA: Diagnosis not present

## 2018-04-18 ENCOUNTER — Encounter: Payer: Self-pay | Admitting: Emergency Medicine

## 2018-04-18 ENCOUNTER — Ambulatory Visit: Payer: Medicare Other | Admitting: Emergency Medicine

## 2018-04-18 DIAGNOSIS — J449 Chronic obstructive pulmonary disease, unspecified: Secondary | ICD-10-CM | POA: Diagnosis not present

## 2018-04-18 DIAGNOSIS — R05 Cough: Secondary | ICD-10-CM

## 2018-04-18 DIAGNOSIS — R059 Cough, unspecified: Secondary | ICD-10-CM

## 2018-04-18 NOTE — Assessment & Plan Note (Signed)
Continue Advair twice a day as you have been taking it.  Remember to rinse and gargle after using this medication. Keep DuoNeb available to use up to 3 times a day if needed for shortness of breath, wheezing, chest tightness.  Try to be more diligent about keeping track of your symptoms and using the nebulizer when you have trouble.  We want to see if it is helping you like it is supposed to. Agree with walking and staying active every day. Follow with Dr Lamonte Sakai in 6 months or sooner if you have any problems

## 2018-04-18 NOTE — Progress Notes (Signed)
  Subjective:    Patient ID: NORAH FICK, male    DOB: January 05, 1924, 84 y.o.   MRN: [id number]  HPI 84 yo former smoker, 60+ pk-yrs, hx DM, PVD (fem bypass), AAA with stenting, HTN, CVA '05. Was dx with COPD in 12/13 when he was admitted to [facility] for CAP and a flare. He was discharged on Advair and combivent, but he didn't stay on these - poor coordination taking HFA's. He has had many episodes of PNA vs AE, averages about once a year. Has been admitted 3 times in the past. Spirometry 2014 with moderately severe obstruction. Last seen 09/30/16. He has been managed on DuoNeb when necessary. He uses rarely. He reports that his breathing has been stable. His exertion is not being limited by breathing. He is using mucinex, loratadine.   ROV 10/11/17 --this is a follow-up visit.  The patient has a history of COPD, frequent exacerbations.  Also with allergic rhinitis for which she has been treated with loratadine. He is still able to ambulate but is having some balance difficulty. He is not having dyspnea. His cough is a bit worse than last time. Small amt white mucous. Remains on mucinex. He rarely uses duoneb, unclear that it helps him. Flu shot up to date. Pneumovax and Prevnar 13 up to date.    ROV 04/18/18 --84 year old gentleman with a history of COPD, peripheral vascular disease, hypertension, diabetes.  I have followed him for his COPD, frequent exacerbations.  He also has allergic rhinitis and deals with chronic mucus, chronic cough. He has advair and is using reliably. He has duoneb available but does not use very often - his wife believes that he should more frequently.       Objective:   Physical Exam  Vitals:   04/18/18 1010  BP: 122/82  Pulse: 68  SpO2: 97%  Weight: 159 lb (72.1 kg)  Height: 5\' 6"  (1.676 m)   Gen: Pleasant, elderly man, in no distress,  normal affect  ENT: No lesions,  mouth clear,  oropharynx clear, no postnasal drip  Neck: No JVD, no TMG, no carotid  bruits  Lungs: No use of accessory muscles, distant, clear without rales or rhonchi  Cardiovascular: RRR, heart sounds normal, no murmur or gallops, no peripheral edema  Musculoskeletal: No deformities, no cyanosis or clubbing  Neuro: alert, non focal  Skin: Warm, no lesions or rash     Assessment & Plan:  COPD (chronic obstructive pulmonary disease) (HCC) Continue Advair twice a day as you have been taking it.  Remember to rinse and gargle after using this medication. Keep DuoNeb available to use up to 3 times a day if needed for shortness of breath, wheezing, chest tightness.  Try to be more diligent about keeping track of your symptoms and using the nebulizer when you have trouble.  We want to see if it is helping you like it is supposed to. Agree with walking and staying active every day. Follow with Dr Lamonte Sakai in 6 months or sooner if you have any problems  Cough Continue Claritin, Mucinex as you are taking them.  Baltazar Apo, MD, PhD 04/18/2018, 10:36 AM [facility] [phone] or if no answer [phone]

## 2018-04-18 NOTE — Assessment & Plan Note (Signed)
Continue Claritin, Mucinex as you are taking them.

## 2018-04-18 NOTE — Patient Instructions (Addendum)
Continue Advair twice a day as you have been taking it.  Remember to rinse and gargle after using this medication. Keep DuoNeb available to use up to 3 times a day if needed for shortness of breath, wheezing, chest tightness.  Try to be more diligent about keeping track of your symptoms and using the nebulizer when you have trouble.  We want to see if it is helping you like it is supposed to. Agree with walking and staying active every day. Continue Claritin, Mucinex as you are taking them. Follow with Dr Lamonte Sakai in 6 months or sooner if you have any problems

## 2018-04-19 ENCOUNTER — Encounter: Payer: Self-pay | Admitting: Family Medicine

## 2018-04-21 ENCOUNTER — Other Ambulatory Visit: Payer: Self-pay | Admitting: Family Medicine

## 2018-04-21 MED ORDER — BENZONATATE 200 MG PO CAPS: ORAL_CAPSULE | ORAL | 3 refills | Status: DC

## 2018-05-21 ENCOUNTER — Encounter: Payer: Self-pay | Admitting: Family Medicine

## 2018-05-23 ENCOUNTER — Ambulatory Visit: Payer: Medicare Other

## 2018-05-23 ENCOUNTER — Other Ambulatory Visit: Payer: Self-pay | Admitting: Family Medicine

## 2018-05-23 DIAGNOSIS — E1159 Type 2 diabetes mellitus with other circulatory complications: Secondary | ICD-10-CM

## 2018-05-26 ENCOUNTER — Inpatient Hospital Stay: Payer: Medicare Other | Admitting: Family Medicine

## 2018-05-26 ENCOUNTER — Encounter: Payer: Medicare Other | Admitting: Family Medicine

## 2018-05-31 ENCOUNTER — Ambulatory Visit: Payer: Medicare Other | Admitting: Family Medicine

## 2018-05-31 ENCOUNTER — Encounter: Payer: Self-pay | Admitting: Family Medicine

## 2018-05-31 VITALS — BP 120/60 | HR 69 | Temp 98.3°F | Ht 66.0 in | Wt 159.0 lb

## 2018-05-31 DIAGNOSIS — J449 Chronic obstructive pulmonary disease, unspecified: Secondary | ICD-10-CM | POA: Diagnosis not present

## 2018-05-31 LAB — COMPREHENSIVE METABOLIC PANEL
ALT: 17 U/L (ref 0–53)
AST: 18 U/L (ref 0–37)
Albumin: 4.3 g/dL (ref 3.5–5.2)
Alkaline Phosphatase: 41 U/L (ref 39–117)
BUN: 13 mg/dL (ref 6–23)
CO2: 32 mEq/L (ref 19–32)
Calcium: 10.2 mg/dL (ref 8.4–10.5)
Chloride: 92 mEq/L — ABNORMAL LOW (ref 96–112)
Creatinine, Ser: 1.27 mg/dL (ref 0.40–1.50)
GFR: 56.32 mL/min — ABNORMAL LOW (ref >60.00)
Glucose, Bld: 130 mg/dL — ABNORMAL HIGH (ref 70–99)
Potassium: 3.9 mEq/L (ref 3.5–5.1)
Sodium: 130 mEq/L — ABNORMAL LOW (ref 135–145)
Total Bilirubin: 1 mg/dL (ref 0.2–1.2)
Total Protein: 7 g/dL (ref 6.0–8.3)

## 2018-05-31 LAB — BASIC METABOLIC PANEL
BUN: 13 mg/dL (ref 6–23)
CO2: 32 mEq/L (ref 19–32)
Calcium: 10.2 mg/dL (ref 8.4–10.5)
Chloride: 92 mEq/L — ABNORMAL LOW (ref 96–112)
Creatinine, Ser: 1.27 mg/dL (ref 0.40–1.50)
GFR: 56.32 mL/min — ABNORMAL LOW (ref >60.00)
Glucose, Bld: 130 mg/dL — ABNORMAL HIGH (ref 70–99)
Potassium: 3.9 mEq/L (ref 3.5–5.1)
Sodium: 130 mEq/L — ABNORMAL LOW (ref 135–145)

## 2018-05-31 NOTE — Patient Instructions (Addendum)
Keep checking your pulse ox intermittently and update me if <90% walking.  If >90% consistently then that should be fine.   Go to the lab on the way out.  We'll contact you with your lab report. Update me as needed.  Keep using your inhalers.  Take care.  Glad to see you.

## 2018-05-31 NOTE — Progress Notes (Signed)
Patient was out of town and developed a cough and shortness of breath.  He went to a local emergency room and was admitted with COPD exacerbation.  He was treated with antibiotics and steroids.  He required supplemental oxygen with ambulation due to hypoxia with ambulation.  He was not hypoxic at rest otherwise.  He improved and he was able to be discharged.  He is back at home now.  He has O2 tank at home but has not needed it- wife/pt checked and he has kept O2 stats at goal except with ambulation.  Fall risk cautions d/w pt.    Done with abx and steroids.  Still on inhalers at baseline.  He had some prev oral pain, resolved now.  No clear trigger- could have been from jaw clenching vs med effect but resolved now.    He isn't sleeping well at baseline.  We talked about melatonin use if needed.   PMH and SH reviewed  ROS: Per HPI unless specifically indicated in ROS section   Meds, vitals, and allergies reviewed.   GEN: nad, alert and oriented HEENT: mucous membranes moist NECK: supple w/o LA CV: rrr. PULM: ctab, no inc wob ABD: soft, +bs EXT: no edema SKIN: no acute rash Using walker at baseline.   On RA at time of OV.

## 2018-06-02 ENCOUNTER — Encounter: Payer: Self-pay | Admitting: Family Medicine

## 2018-06-02 NOTE — Assessment & Plan Note (Signed)
With recent exacerbation effectively treated.  He can use oxygen as needed with ambulation.  I expect/hope he will graduate from this and not needed in the relatively near future. He'll keep checking pulse ox intermittently and update me if <90% walking.  If >90% consistently then that should be fine.   See notes on labs.  Update me as needed.  Keep using baseline inhalers.  Okay for outpatient follow-up.  All questions answered. >25 minutes spent in face to face time with patient, >50% spent in counselling or coordination of care.

## 2018-06-03 ENCOUNTER — Other Ambulatory Visit: Payer: Self-pay | Admitting: Family Medicine

## 2018-06-03 DIAGNOSIS — J449 Chronic obstructive pulmonary disease, unspecified: Secondary | ICD-10-CM

## 2018-06-06 ENCOUNTER — Other Ambulatory Visit: Payer: Self-pay | Admitting: Family Medicine

## 2018-06-22 ENCOUNTER — Ambulatory Visit (INDEPENDENT_AMBULATORY_CARE_PROVIDER_SITE_OTHER): Payer: Medicare Other

## 2018-06-22 VITALS — BP 118/74 | HR 87 | Temp 98.1°F | Ht 64.5 in | Wt 157.0 lb

## 2018-06-22 DIAGNOSIS — J449 Chronic obstructive pulmonary disease, unspecified: Secondary | ICD-10-CM

## 2018-06-22 DIAGNOSIS — E1159 Type 2 diabetes mellitus with other circulatory complications: Secondary | ICD-10-CM | POA: Diagnosis not present

## 2018-06-22 DIAGNOSIS — Z0000 Encounter for general adult medical examination without abnormal findings: Secondary | ICD-10-CM

## 2018-06-22 LAB — CBC WITH DIFFERENTIAL/PLATELET
Basophils Absolute: 0 10*3/uL (ref 0.0–0.1)
Basophils Relative: 0.5 % (ref 0.0–3.0)
Eosinophils Absolute: 0.3 10*3/uL (ref 0.0–0.7)
Eosinophils Relative: 3.5 % (ref 0.0–5.0)
HCT: 48.7 % (ref 39.0–52.0)
Hemoglobin: 16.7 g/dL (ref 13.0–17.0)
Lymphocytes Relative: 10.3 % — ABNORMAL LOW (ref 12.0–46.0)
Lymphs Abs: 0.9 10*3/uL (ref 0.7–4.0)
MCHC: 34.2 g/dL (ref 30.0–36.0)
MCV: 100.2 fl — ABNORMAL HIGH (ref 78.0–100.0)
Monocytes Absolute: 1.1 10*3/uL — ABNORMAL HIGH (ref 0.1–1.0)
Monocytes Relative: 12 % (ref 3.0–12.0)
Neutro Abs: 6.6 10*3/uL (ref 1.4–7.7)
Neutrophils Relative %: 73.7 % (ref 43.0–77.0)
Platelets: 249 10*3/uL (ref 150.0–400.0)
RBC: 4.86 Mil/uL (ref 4.22–5.81)
RDW: 12.8 % (ref 11.5–15.5)
WBC: 9 10*3/uL (ref 4.0–10.5)

## 2018-06-22 LAB — COMPREHENSIVE METABOLIC PANEL
ALT: 12 U/L (ref 0–53)
AST: 21 U/L (ref 0–37)
Albumin: 4.6 g/dL (ref 3.5–5.2)
Alkaline Phosphatase: 53 U/L (ref 39–117)
BUN: 12 mg/dL (ref 6–23)
CO2: 31 mEq/L (ref 19–32)
Calcium: 10.8 mg/dL — ABNORMAL HIGH (ref 8.4–10.5)
Chloride: 93 mEq/L — ABNORMAL LOW (ref 96–112)
Creatinine, Ser: 1.28 mg/dL (ref 0.40–1.50)
GFR: 55.8 mL/min — ABNORMAL LOW (ref >60.00)
Glucose, Bld: 159 mg/dL — ABNORMAL HIGH (ref 70–99)
Potassium: 3.7 mEq/L (ref 3.5–5.1)
Sodium: 133 mEq/L — ABNORMAL LOW (ref 135–145)
Total Bilirubin: 1.2 mg/dL (ref 0.2–1.2)
Total Protein: 7.5 g/dL (ref 6.0–8.3)

## 2018-06-22 LAB — LIPID PANEL
Cholesterol: 152 mg/dL (ref 0–200)
HDL: 52.4 mg/dL (ref >39.00)
LDL Cholesterol: 63 mg/dL (ref 0–99)
NonHDL: 99.18
Total CHOL/HDL Ratio: 3
Triglycerides: 180 mg/dL — ABNORMAL HIGH (ref 0.0–149.0)
VLDL: 36 mg/dL (ref 0.0–40.0)

## 2018-06-22 LAB — HEMOGLOBIN A1C: Hgb A1c MFr Bld: 7.1 % — ABNORMAL HIGH (ref 4.6–6.5)

## 2018-06-22 LAB — MICROALBUMIN / CREATININE URINE RATIO
Creatinine,U: 74.6 mg/dL
Microalb Creat Ratio: 0.9 mg/g (ref 0.0–30.0)
Microalb, Ur: <0.7 mg/dL (ref 0.0–1.9)

## 2018-06-22 LAB — TSH: TSH: 4.99 u[IU]/mL — ABNORMAL HIGH (ref 0.35–4.50)

## 2018-06-22 NOTE — Progress Notes (Signed)
PCP notes:   Health maintenance:  Flu vaccine - addressed A1C - completed  Abnormal screenings:   Fall risk - hx of multiple falls Fall Risk  06/22/2018 05/18/2017  Falls in the past year? Yes No  Comment 1st fall was in bathroom and 2nd fall was in  living room -  Number falls in past yr: 2 or more -  Injury with Fall? Yes -  Risk Factor Category  High Fall Risk -  Risk for fall due to : Impaired balance/gait;History of fall(s);Impaired mobility -   Patient concerns:   Patient reports concerns with painful nodules on both arms.   Patient requests PCP assess his ears for cerumen impaction.   Nurse concerns:  None  Next PCP appt:   07/01/18 @ 1200

## 2018-06-22 NOTE — Progress Notes (Signed)
Subjective:   Dylan Knight is a 84 y.o. male who presents for Medicare Annual/Subsequent preventive examination.  Review of Systems:  N/A Cardiac Risk Factors include: advanced age (>13men, >19 women);male gender     Objective:    Vitals: BP 118/74 (BP Location: Left Arm, Patient Position: Sitting, Cuff Size: Normal)   Pulse 87   Temp 98.1 F (36.7 C) (Oral)   Ht 5' 4.5" (1.638 m) Comment: shoes  Wt 157 lb (71.2 kg)   SpO2 94%   BMI 26.53 kg/m   Body mass index is 26.53 kg/m.  Advanced Directives 06/22/2018 06/30/2017 05/07/2017 05/06/2017 01/30/2017 01/06/2017 12/08/2016  Does Patient Have a Medical Advance Directive? Yes Yes Yes Yes Yes Yes Yes  Type of Paramedic of [place];Living will [place];Living will [place];Living will [place];Living will [place];Living will [place];Living will Out of facility DNR (pink MOST or yellow form)  Does patient want to make changes to medical advance directive? - - No - Patient declined No - Patient declined No - Patient declined - No - Patient declined  Copy of [place] in Chart? No - copy requested No - copy requested No - copy requested No - copy requested No - copy requested No - copy requested -    Tobacco Social History   Tobacco Use  Smoking Status Former Smoker  . Packs/day: 1.00  . Years: 63.00  . Pack years: 63.00  . Types: Cigarettes  . Last attempt to quit: 04/09/2004  . Years since quitting: 14.2  Smokeless Tobacco Former Systems developer  Tobacco Comment   Quit smoking in 2005     Counseling given: No Comment: Quit smoking in 2005   Clinical Intake:  Pre-visit preparation completed: Yes  Pain : No/denies pain Pain Score: 0-No pain     Nutritional Status: BMI 25 -29 Overweight Nutritional Risks: None Diabetes: Yes CBG done?: No Did pt. bring in CBG monitor from home?:  No  How often do you need to have someone help you when you read instructions, pamphlets, or other written materials from your doctor or pharmacy?: 1 - Never What is the last grade level you completed in school?: 12th grade  Interpreter Needed?: No  Comments: pt lives with spouse Information entered by :: LPinson, LPN  Past Medical History:  Diagnosis Date  . Arthritis    left hip  . BPH (benign prostatic hyperplasia)   . Cancer [facility])    Skin cancers removed  . Carotid artery occlusion   . COPD (chronic obstructive pulmonary disease) ([place])   . Diabetes mellitus without complication ([place])   . Hyperlipidemia   . Hypertension   . Hypothyroidism   . Macular degeneration   . Macular degeneration   . Peripheral arterial disease ([place]) 2002  . Stroke [facility]) 2005   Past Surgical History:  Procedure Laterality Date  . ABDOMINAL AORTIC ANEURYSM REPAIR  2003   Aorto-left femoral-right iliac BPG by Dr. Scot Dock  . Aortic aneursym surgery     2003- Dr. Scot Dock  . BACK SURGERY     1960  . CAROTID ENDARTERECTOMY  Jan. 25,2007   Right CEA  . Carotid stenosis surgery on right     2007  . CARPAL TUNNEL RELEASE Left 06/02/2016   Procedure: LEFT CARPAL TUNNEL RELEASE;  Surgeon: Daryll Brod, MD;  Location: [place];  Service: Orthopedics;  Laterality: Left;  . EYE SURGERY  Catarct surgery and lens implant - bilateral  . PR VEIN BYPASS GRAFT,AORTO-FEM-POP  2005   Revision Right Fem-pop  . PR VEIN BYPASS GRAFT,AORTO-FEM-POP  2002   Right Fem-pop  . RADIOLOGY WITH ANESTHESIA N/A 12/02/2016   Procedure: MRI LUMBAR SPINE WITHOUT;  Surgeon: Medication Radiologist, MD;  Location: [place];  Service: Radiology;  Laterality: N/A;  . Right leg bypass     2002 -  and amputation of right fifth toe  . SPINE SURGERY  1960   lumbar spine  . [place], 2007   cervical spine  . TOE AMPUTATION  08-2001   right 5th toe amputation   Family History  Problem Relation Age of  Onset  . Stroke Mother   . Hypertension Mother   . Heart disease Father   . Heart attack Father   . Cancer Brother   . Hypertension Brother    Social History   Socioeconomic History  . Marital status: Married    Spouse name: Not on file  . Number of children: Not on file  . Years of education: Not on file  . Highest education level: Not on file  Occupational History  . Not on file  Social Needs  . Financial resource strain: Not on file  . Food insecurity:    Worry: Not on file    Inability: Not on file  . Transportation needs:    Medical: Not on file    Non-medical: Not on file  Tobacco Use  . Smoking status: Former Smoker    Packs/day: 1.00    Years: 63.00    Pack years: 63.00    Types: Cigarettes    Last attempt to quit: 04/09/2004    Years since quitting: 14.2  . Smokeless tobacco: Former Systems developer  . Tobacco comment: Quit smoking in 2005  Substance and Sexual Activity  . Alcohol use: No    Alcohol/week: 0.0 standard drinks  . Drug use: No  . Sexual activity: Not on file  Lifestyle  . Physical activity:    Days per week: Not on file    Minutes per session: Not on file  . Stress: Not on file  Relationships  . Social connections:    Talks on phone: Not on file    Gets together: Not on file    Attends religious service: Not on file    Active member of club or organization: Not on file    Attends meetings of clubs or organizations: Not on file    Relationship status: Not on file  Other Topics Concern  . Not on file  Social History Narrative   Former Dance movement psychotherapist for [employer]   Married 1996   From [place]. February '45 through February '47, honorable discharge. 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 acetaminophen (TYLENOL) 325 MG tablet Take 650 mg by mouth every 6 (six) hours as needed for mild pain.   Marland Kitchen albuterol (PROVENTIL HFA;VENTOLIN HFA) 108 (90 BASE) MCG/ACT  inhaler Inhale 2 puffs into the lungs every 6 (six) hours as needed for wheezing or shortness of breath.  Marland Kitchen atenolol-chlorthalidone (TENORETIC) 50-25 MG tablet TAKE 1/2 TABLET BY ORAL ROUTE EVERY DAY  . benzonatate (TESSALON) 200 MG capsule TAKE 1 CAPSULE BY MOUTH THREE TIMES A DAY AS NEEDED  . clopidogrel (PLAVIX) 75 MG tablet TAKE 1 TABLET (75MG ) BY MOUTH EVERY DAY  . ezetimibe-simvastatin (VYTORIN) 10-40  MG tablet TAKE ONE TABLET BY MOUTH DAILY  . Fluticasone-Salmeterol (ADVAIR DISKUS) 250-50 MCG/DOSE AEPB Inhale 1 puff into the lungs 2 (two) times daily.  Marland Kitchen guaiFENesin (MUCINEX) 600 MG 12 hr tablet Take 600 mg by mouth 2 (two) times daily.   Marland Kitchen ipratropium-albuterol (DUONEB) 0.5-2.5 (3) MG/3ML SOLN Take 3 mLs by nebulization 3 (three) times daily as needed.  Marland Kitchen JANUMET 50-500 MG tablet TAKE 1 TABLET BY MOUTH EVERY DAY  . levothyroxine (SYNTHROID, LEVOTHROID) 25 MCG tablet TAKE 1.5 TABLET BY MOUTH EVERY DAY ON MONDAY THROUGH FRIDAY AND 2 TABS ON SAT/SUN  . loratadine (CLARITIN) 10 MG tablet Take 10 mg by mouth daily. Reported on 01/03/2016  . Multiple Vitamins-Minerals (CENTRUM SILVER PO) Take 1 tablet by mouth daily.  . Multiple Vitamins-Minerals (OCUVITE PRESERVISION PO) Take 1 tablet by mouth 2 (two) times daily.  . Omega-3 Fatty Acids (FISH OIL) 1000 MG CAPS Take by mouth.   No facility-administered encounter medications on file as of 06/22/2018.     Activities of Daily Living In your present state of health, do you have any difficulty performing the following activities: 06/22/2018  Hearing? Y  Comment hearing aids but does not wear  Vision? Y  Comment macular degeneration in left eye  Difficulty concentrating or making decisions? N  Walking or climbing stairs? Y  Dressing or bathing? N  Doing errands, shopping? Y  Preparing Food and eating ? N  Using the Toilet? N  In the past six months, have you accidently leaked urine? N  Do you have problems with loss of bowel control? N   Managing your Medications? N  Managing your Finances? N  Housekeeping or managing your Housekeeping? N  Some recent data might be hidden    Patient Care Team: Tonia Ghent, MD as PCP - General (Family Medicine) Marybelle Killings, MD as Consulting Physician (Orthopedic Surgery) Magnus Sinning, MD as Consulting Physician (Physical Medicine and Rehabilitation) Nahser, Wonda Cheng, MD as Consulting Physician (Cardiology) Hayden Pedro, MD as Consulting Physician (Ophthalmology) Collene Gobble, MD as Consulting Physician (Pulmonary Disease)   Assessment:   This is a routine wellness examination for Dylan Knight.   Hearing Screening   125Hz  250Hz  500Hz  1000Hz  2000Hz  3000Hz  4000Hz  6000Hz  8000Hz   Right ear:   0 0 0  0    Left ear:   0 0 0  0    Comments: Candidate for hearing aids; unable to wear  Vision Screening Comments: Eye exams every 6 mths with either Dr. Marvel Plan or Dr. Zigmund Daniel   Exercise Activities and Dietary recommendations Current Exercise Habits: The patient does not participate in regular exercise at present, Exercise limited by: None identified  Goals    . Patient Stated     Starting 06/22/2018, I will continue to take medications as prescribed.        Fall Risk Fall Risk  06/22/2018 05/18/2017  Falls in the past year? Yes No  Comment 1st fall was in bathroom and 2nd fall was in  living room -  Number falls in past yr: 2 or more -  Injury with Fall? Yes -  Risk Factor Category  High Fall Risk -  Risk for fall due to : Impaired balance/gait;History of fall(s);Impaired mobility -  Depression Screen PHQ 2/9 Scores 06/22/2018 05/18/2017  PHQ - 2 Score 0 0  PHQ- 9 Score 0 -    Cognitive Function MMSE - Mini Mental State Exam 06/22/2018  Orientation to time 5  Orientation to Place  5  Registration 3  Attention/ Calculation 0  Recall 3  Language- name 2 objects 0  Language- repeat 1  Language- follow 3 step command 3  Language- read & follow direction 0  Write a  sentence 0  Copy design 0  Total score 20        Immunization History  Administered Date(s) Administered  . Influenza Split 07/23/2012, 08/12/2013, 07/10/2014, 06/28/2015, 07/12/2017  . Influenza, High Dose Seasonal PF 07/18/2017  . Influenza,inj,Quad PF,6+ Mos 07/08/2016  . Influenza-Unspecified 06/28/2015, 07/21/2017  . PPD Test 12/04/2016  . Pneumococcal Conjugate-13 08/28/2014, 02/25/2017  . Pneumococcal Polysaccharide-23 11/09/2009, 05/09/2018  . Td 01/29/2009  . Tdap 04/17/2018  . Zoster 01/06/2012  . Zoster Recombinat (Shingrix) 02/17/2017, 07/21/2017    Screening Tests Health Maintenance  Topic Date Due  . INFLUENZA VACCINE  02/07/2019 (Originally 06/09/2018)  . HEMOGLOBIN A1C  12/23/2018  . OPHTHALMOLOGY EXAM  01/20/2019  . FOOT EXAM  02/23/2019  . TETANUS/TDAP  04/17/2028  . PNA vac Low Risk Adult  Completed       Plan:   I have personally reviewed, addressed, and noted the following in the patient's chart:  A. Medical and social history B. Use of alcohol, tobacco or illicit drugs  C. Current medications and supplements D. Functional ability and status E.  Nutritional status F.  Physical activity G. Advance directives H. List of other physicians I.  Hospitalizations, surgeries, and ER visits in previous 12 months J.  [place] to include hearing, vision, cognitive, depression L. Referrals and appointments - none  In addition, I have reviewed and discussed with patient certain preventive protocols, quality metrics, and best practice recommendations. A written personalized care plan for preventive services as well as general preventive health recommendations were provided to patient.  See attached scanned questionnaire for additional information.   Signed,   Lindell Noe, MHA, BS, LPN Health Coach

## 2018-06-22 NOTE — Patient Instructions (Signed)
Dylan Knight , Thank you for taking time to come for your Medicare Wellness Visit. I appreciate your ongoing commitment to your health goals. Please review the following plan we discussed and let me know if I can assist you in the future.   These are the goals we discussed: Goals    . Patient Stated     Starting 06/22/2018, I will continue to take medications as prescribed.        This is a list of the screening recommended for you and due dates:  Health Maintenance  Topic Date Due  . Flu Shot  02/07/2019*  . Hemoglobin A1C  12/23/2018  . Eye exam for diabetics  01/20/2019  . Complete foot exam   02/23/2019  . Tetanus Vaccine  04/17/2028  . Pneumonia vaccines  Completed  *Topic was postponed. The date shown is not the original due date.   Preventive Care for Adults  A healthy lifestyle and preventive care can promote health and wellness. Preventive health guidelines for adults include the following key practices.  . A routine yearly physical is a good way to check with your health care provider about your health and preventive screening. It is a chance to share any concerns and updates on your health and to receive a thorough exam.  . Visit your dentist for a routine exam and preventive care every 6 months. Brush your teeth twice a day and floss once a day. Good oral hygiene prevents tooth decay and gum disease.  . The frequency of eye exams is based on your age, health, family medical history, use  of contact lenses, and other factors. Follow your health care provider's recommendations for frequency of eye exams.  . Eat a healthy diet. Foods like vegetables, fruits, whole grains, low-fat dairy products, and lean protein foods contain the nutrients you need without too many calories. Decrease your intake of foods high in solid fats, added sugars, and salt. Eat the right amount of calories for you. Get information about a proper diet from your health care provider, if necessary.  .  Regular physical exercise is one of the most important things you can do for your health. Most adults should get at least 150 minutes of moderate-intensity exercise (any activity that increases your heart rate and causes you to sweat) each week. In addition, most adults need muscle-strengthening exercises on 2 or more days a week.  Silver Sneakers may be a benefit available to you. To determine eligibility, you may visit the website: www.silversneakers.com or contact program at 226 760 2113 Mon-Fri between 8AM-8PM.   . Maintain a healthy weight. The body mass index (BMI) is a screening tool to identify possible weight problems. It provides an estimate of body fat based on height and weight. Your health care provider can find your BMI and can help you achieve or maintain a healthy weight.   For adults 20 years and older: ? A BMI below 18.5 is considered underweight. ? A BMI of 18.5 to 24.9 is normal. ? A BMI of 25 to 29.9 is considered overweight. ? A BMI of 30 and above is considered obese.   . Maintain normal blood lipids and cholesterol levels by exercising and minimizing your intake of saturated fat. Eat a balanced diet with plenty of fruit and vegetables. Blood tests for lipids and cholesterol should begin at age 18 and be repeated every 5 years. If your lipid or cholesterol levels are high, you are over 50, or you are at high risk for  heart disease, you may need your cholesterol levels checked more frequently. Ongoing high lipid and cholesterol levels should be treated with medicines if diet and exercise are not working.  . If you smoke, find out from your health care provider how to quit. If you do not use tobacco, please do not start.  . If you choose to drink alcohol, please do not consume more than 2 drinks per day. One drink is considered to be 12 ounces (355 mL) of beer, 5 ounces (148 mL) of wine, or 1.5 ounces (44 mL) of liquor.  . If you are 35-66 years old, ask your health care  provider if you should take aspirin to prevent strokes.  . Use sunscreen. Apply sunscreen liberally and repeatedly throughout the day. You should seek shade when your shadow is shorter than you. Protect yourself by wearing long sleeves, pants, a wide-brimmed hat, and sunglasses year round, whenever you are outdoors.  . Once a month, do a whole body skin exam, using a mirror to look at the skin on your back. Tell your health care provider of new moles, moles that have irregular borders, moles that are larger than a pencil eraser, or moles that have changed in shape or color.

## 2018-06-22 NOTE — Progress Notes (Signed)
I reviewed health advisor's note, was available for consultation, and agree with documentation and plan.   Signed,  Monserratt Knezevic T. Zaeem Kandel, MD  

## 2018-06-25 IMAGING — CR DG HIP (WITH OR WITHOUT PELVIS) 2-3V*R*
3 series · 3 of 3 positions shown · non-contrast
Comparison: No recent prior.

CLINICAL DATA: Hip pain.  No injury.

EXAM:
DG HIP (WITH OR WITHOUT PELVIS) 2-3V RIGHT

[pelvis ap]
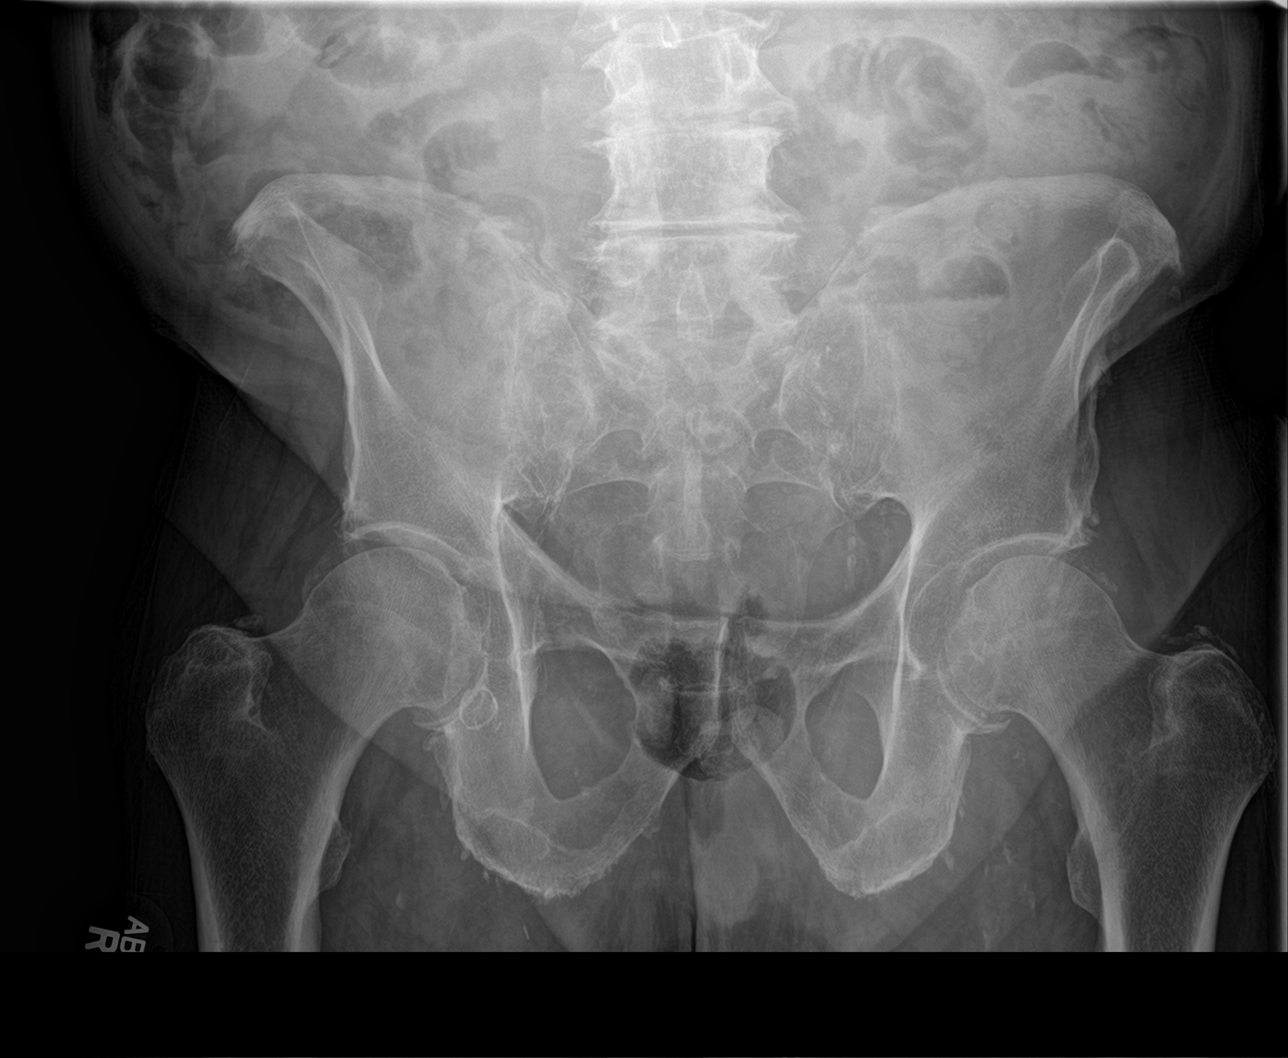

[hip ap]
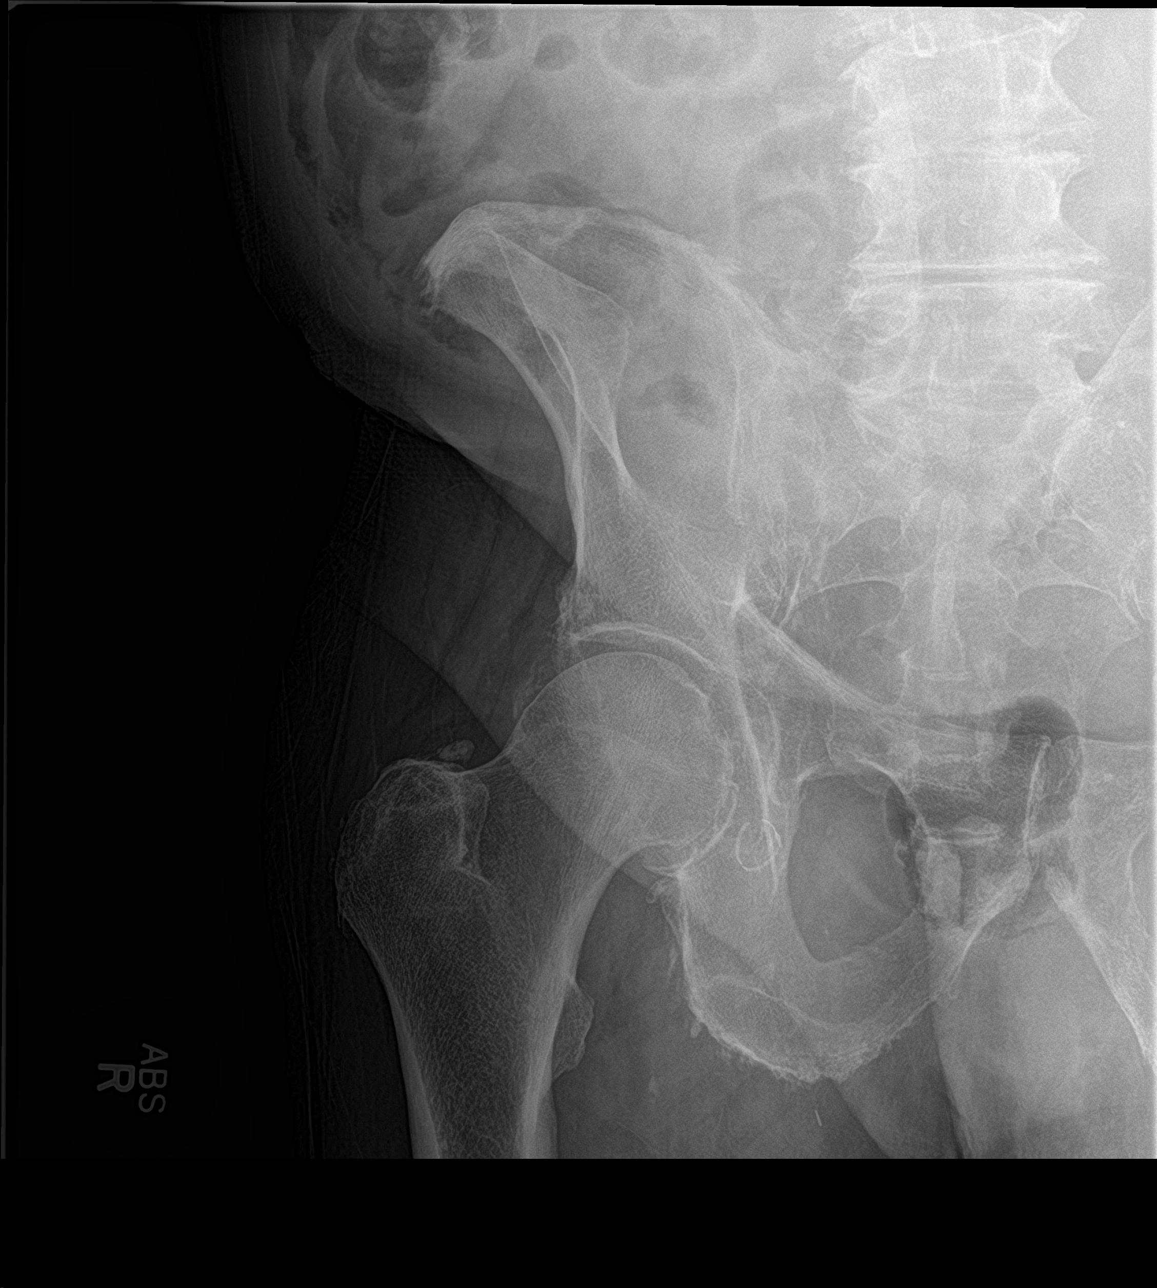

[hip lat]
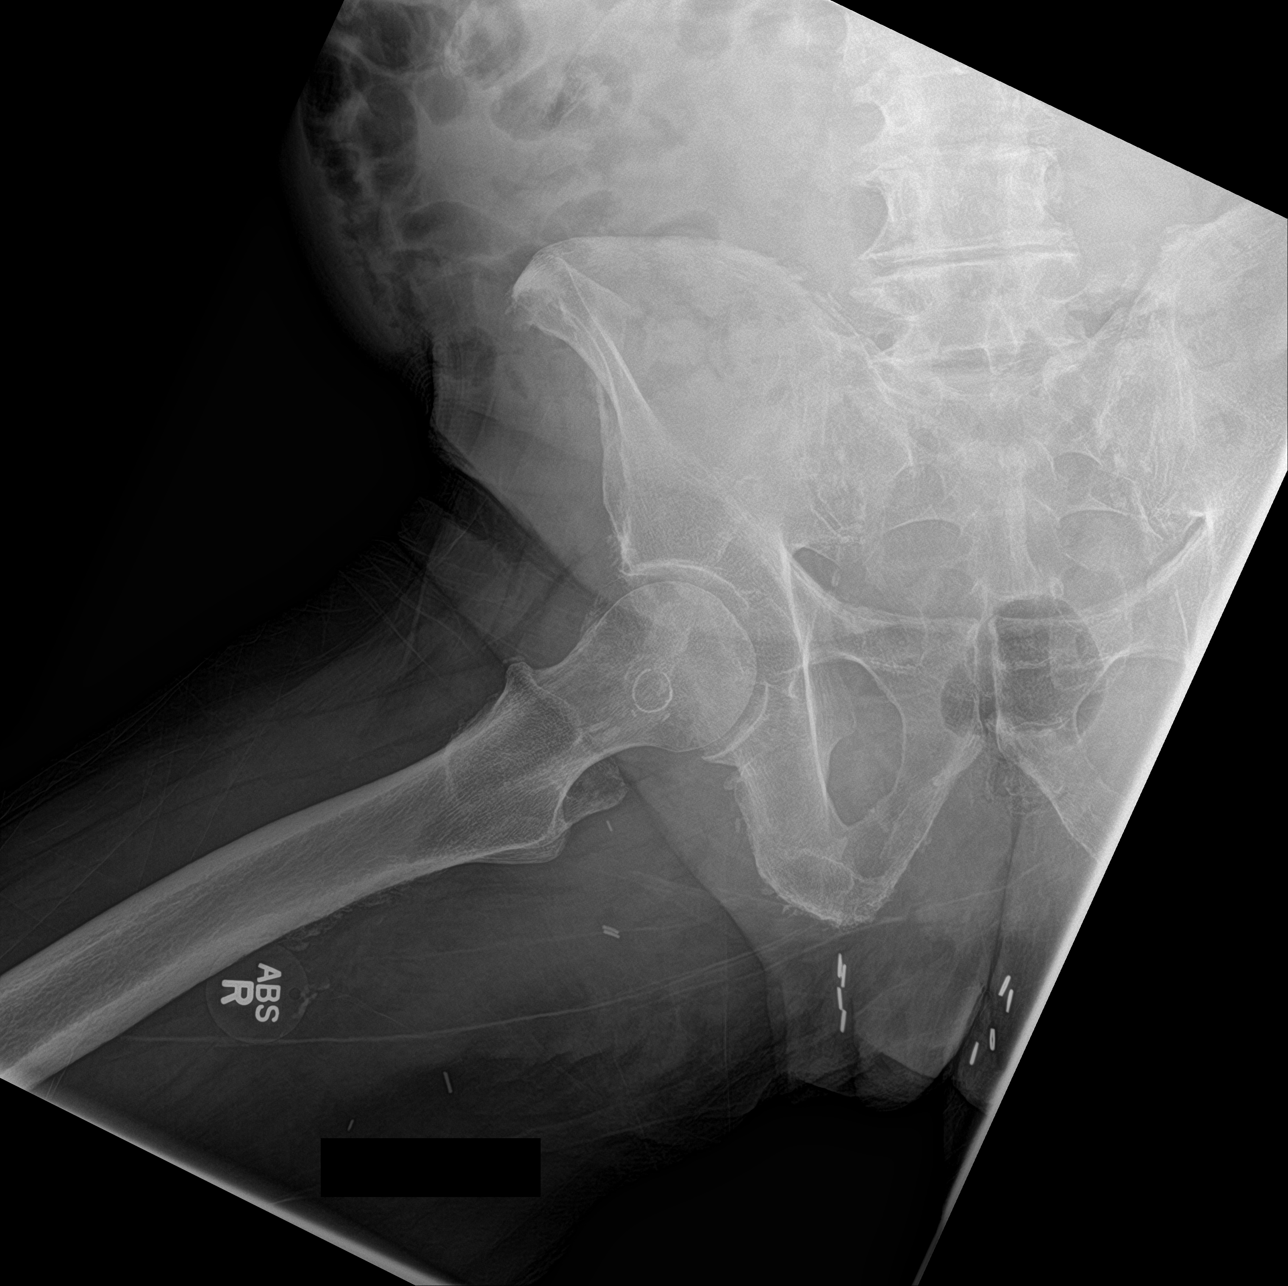

[3 of 3 positions shown; findings below may reference images not displayed]

FINDINGS: Degenerative changes lumbar spine and both hips. No evidence of
fracture or dislocation. Aortoiliac atherosclerotic vascular
disease. Surgical clips in the pelvis.
IMPRESSION: 1.  Degenerative changes lumbar spine and both hips.

2.  Aortoiliac atherosclerotic vascular disease.

## 2018-06-30 ENCOUNTER — Other Ambulatory Visit: Payer: Self-pay | Admitting: Family Medicine

## 2018-07-01 ENCOUNTER — Ambulatory Visit (INDEPENDENT_AMBULATORY_CARE_PROVIDER_SITE_OTHER): Payer: Medicare Other | Admitting: Family Medicine

## 2018-07-01 DIAGNOSIS — E1159 Type 2 diabetes mellitus with other circulatory complications: Secondary | ICD-10-CM

## 2018-07-03 NOTE — Progress Notes (Addendum)
Patient was going to have routine follow-up with but his wife was acutely ill so she used his spot on the schedule.  Patient agreed to this.  We briefly talked about his labs that had no emergent values.  He was feeling at his baseline level of health and had no emergent issues.  We deferred his visit for today except for talking about his ears.  He had some bilateral ear fullness.  I checked his canals and he had no wax in the canals.  Both eardrums look normal.  He likely has eustachian tube dysfunction.  Discussed gently performing Valsalva maneuver as needed and updating me as needed.  He agrees with that plan.

## 2018-07-03 NOTE — Assessment & Plan Note (Signed)
Visit canceled.  No charge.

## 2018-07-06 ENCOUNTER — Ambulatory Visit: Payer: Medicare Other | Admitting: Vascular Surgery

## 2018-07-06 ENCOUNTER — Encounter: Payer: Self-pay | Admitting: Vascular Surgery

## 2018-07-06 ENCOUNTER — Other Ambulatory Visit: Payer: Self-pay

## 2018-07-06 ENCOUNTER — Ambulatory Visit (HOSPITAL_COMMUNITY)
Admission: RE | Admit: 2018-07-06 | Discharge: 2018-07-06 | Disposition: A | Discharge status: Home / Self Care | Payer: Medicare Other | Source: Ambulatory Visit | Attending: Family | Admitting: Family

## 2018-07-06 VITALS — BP 160/71 | HR 86 | Temp 97.0°F | Resp 20 | Ht 64.5 in | Wt 157.0 lb

## 2018-07-06 DIAGNOSIS — Z95828 Presence of other vascular implants and grafts: Secondary | ICD-10-CM | POA: Insufficient documentation

## 2018-07-06 DIAGNOSIS — I10 Essential (primary) hypertension: Secondary | ICD-10-CM | POA: Insufficient documentation

## 2018-07-06 DIAGNOSIS — E1151 Type 2 diabetes mellitus with diabetic peripheral angiopathy without gangrene: Secondary | ICD-10-CM | POA: Diagnosis not present

## 2018-07-06 DIAGNOSIS — Z87891 Personal history of nicotine dependence: Secondary | ICD-10-CM | POA: Insufficient documentation

## 2018-07-06 DIAGNOSIS — I779 Disorder of arteries and arterioles, unspecified: Secondary | ICD-10-CM

## 2018-07-06 DIAGNOSIS — E785 Hyperlipidemia, unspecified: Secondary | ICD-10-CM | POA: Insufficient documentation

## 2018-07-06 DIAGNOSIS — I2510 Atherosclerotic heart disease of native coronary artery without angina pectoris: Secondary | ICD-10-CM | POA: Diagnosis not present

## 2018-07-06 DIAGNOSIS — I6522 Occlusion and stenosis of left carotid artery: Secondary | ICD-10-CM | POA: Diagnosis not present

## 2018-07-06 DIAGNOSIS — I6523 Occlusion and stenosis of bilateral carotid arteries: Secondary | ICD-10-CM | POA: Diagnosis present

## 2018-07-06 NOTE — Progress Notes (Signed)
Patient name: Dylan Knight MRN: [id number] DOB: 1923-12-11 Sex: male  REASON FOR VISIT:   Follow-up of carotid disease and peripheral vascular disease.  HPI:   Dylan Knight is a pleasant 84 y.o. male who is undergone a previous right carotid endarterectomy in 2007.  I think he previously had a left carotid stent.  In addition I prepared an abdominal aortic aneurysm on in 2003.  In 2005 he had a right femoropopliteal bypass.  I been following him with history of carotid disease and peripheral vascular disease.  When I saw him last his bypass graft in the right was patent with an ABI of 98%.  He had infrainguinal arterial occlusive disease in the left with minimal symptoms.  His carotid disease was stable with no significant recurrent carotid stenosis on either side.  He comes in for routine follow-up visit.  The patient is doing well today with no specific complaints.  Unfortunately his wife was in the hospital with some leg weakness and is now getting therapy and hopefully will be okay.  He denies any history of stroke, TIAs, expressive or receptive aphasia, or amaurosis fugax.  I do not get any clear-cut history of claudication although I think his activity is fairly limited.  He is ambulatory with a walker.  He denies any history of rest pain or nonhealing ulcers.  When I saw him before he had some cellulitis of the left leg which has resolved.  He does have chronic venous insufficiency with some aching pain in his legs with standing.  Past Medical History:  Diagnosis Date  . Arthritis    left hip  . BPH (benign prostatic hyperplasia)   . Cancer [facility])    Skin cancers removed  . Carotid artery occlusion   . COPD (chronic obstructive pulmonary disease) ([place])   . Diabetes mellitus without complication ([place])   . Hyperlipidemia   . Hypertension   . Hypothyroidism   . Macular degeneration   . Macular degeneration   . Peripheral arterial disease ([place]) 2002  . Stroke [facility]) 2005     Family History  Problem Relation Age of Onset  . Stroke Mother   . Hypertension Mother   . Heart disease Father   . Heart attack Father   . Cancer Brother   . Hypertension Brother     SOCIAL HISTORY: Social History   Tobacco Use  . Smoking status: Former Smoker    Packs/day: 1.00    Years: 63.00    Pack years: 63.00    Types: Cigarettes    Last attempt to quit: 04/09/2004    Years since quitting: 14.2  . Smokeless tobacco: Former Systems developer  . Tobacco comment: Quit smoking in 2005  Substance Use Topics  . Alcohol use: No    Alcohol/week: 0.0 standard drinks    Allergies  Allergen Reactions  . Iohexol Hives and Other (See Comments)    Went away with benadryl   . Iodine Rash    Current Outpatient Medications  Medication Sig Dispense Refill  . acetaminophen (TYLENOL) 325 MG tablet Take 650 mg by mouth every 6 (six) hours as needed for mild pain.     Marland Kitchen albuterol (PROVENTIL HFA;VENTOLIN HFA) 108 (90 BASE) MCG/ACT inhaler Inhale 2 puffs into the lungs every 6 (six) hours as needed for wheezing or shortness of breath. 1 Inhaler 6  . atenolol-chlorthalidone (TENORETIC) 50-25 MG tablet TAKE 1/2 TABLET BY ORAL ROUTE EVERY DAY 90 tablet 3  . benzonatate (TESSALON) 200 MG  capsule TAKE 1 CAPSULE BY MOUTH THREE TIMES A DAY AS NEEDED 30 capsule 3  . clopidogrel (PLAVIX) 75 MG tablet TAKE 1 TABLET (75MG ) BY MOUTH EVERY DAY 90 tablet 3  . ezetimibe-simvastatin (VYTORIN) 10-40 MG tablet TAKE ONE TABLET BY MOUTH DAILY 30 tablet 3  . Fluticasone-Salmeterol (ADVAIR DISKUS) 250-50 MCG/DOSE AEPB Inhale 1 puff into the lungs 2 (two) times daily. 1 each 12  . guaiFENesin (MUCINEX) 600 MG 12 hr tablet Take 600 mg by mouth 2 (two) times daily.     Marland Kitchen ipratropium-albuterol (DUONEB) 0.5-2.5 (3) MG/3ML SOLN Take 3 mLs by nebulization 3 (three) times daily as needed. 360 mL 11  . JANUMET 50-500 MG tablet TAKE 1 TABLET BY MOUTH EVERY DAY 90 tablet 1  . levothyroxine (SYNTHROID, LEVOTHROID) 25 MCG tablet  TAKE 1.5 TABLET BY MOUTH EVERY DAY ON MONDAY THROUGH FRIDAY AND 2 TABS ON SAT/SUN 148 tablet 1  . loratadine (CLARITIN) 10 MG tablet Take 10 mg by mouth daily. Reported on 01/03/2016    . Multiple Vitamins-Minerals (CENTRUM SILVER PO) Take 1 tablet by mouth daily.    . Multiple Vitamins-Minerals (OCUVITE PRESERVISION PO) Take 1 tablet by mouth 2 (two) times daily.    . Omega-3 Fatty Acids (FISH OIL) 1000 MG CAPS Take by mouth.     No current facility-administered medications for this visit.     REVIEW OF SYSTEMS:  [X]  denotes positive finding, [ ]  denotes negative finding Cardiac  Comments:  Chest pain or chest pressure:    Shortness of breath upon exertion:    Short of breath when lying flat:    Irregular heart rhythm:        Vascular    Pain in calf, thigh, or hip brought on by ambulation:    Pain in feet at night that wakes you up from your sleep:     Blood clot in your veins:    Leg swelling:         Pulmonary    Oxygen at home:    Productive cough:     Wheezing:         Neurologic    Sudden weakness in arms or legs:     Sudden numbness in arms or legs:     Sudden onset of difficulty speaking or slurred speech:    Temporary loss of vision in one eye:     Problems with dizziness:         Gastrointestinal    Blood in stool:     Vomited blood:         Genitourinary    Burning when urinating:     Blood in urine:        Psychiatric    Major depression:         Hematologic    Bleeding problems:    Problems with blood clotting too easily:        Skin    Rashes or ulcers:        Constitutional    Fever or chills:     PHYSICAL EXAM:   Vitals:   07/06/18 1350 07/06/18 1354  BP: 131/73 (!) 160/71  Pulse: 86   Resp: 20   Temp: (!) 97 F (36.1 C)   TempSrc: Oral   SpO2: 97%   Weight: 157 lb (71.2 kg)   Height: 5' 4.5" (1.638 m)     GENERAL: The patient is a well-nourished male, in no acute distress. The vital signs are documented above. CARDIAC: There  is  a regular rate and rhythm.  VASCULAR: I do not detect carotid bruits. He has palpable femoral pulses. Both feet are warm and well-perfused. He has hyperpigmentation bilaterally consistent with chronic venous insufficiency. PULMONARY: There is good air exchange bilaterally without wheezing or rales. ABDOMEN: Soft and non-tender with normal pitched bowel sounds.  MUSCULOSKELETAL: He has had a previous right fifth toe amputation which is healed. NEUROLOGIC: No focal weakness or paresthesias are detected. SKIN: There are no ulcers or rashes noted. PSYCHIATRIC: The patient has a normal affect.  DATA:    CAROTID DUPLEX: I have independently interpreted his carotid duplex scan today.  On the right side the right carotid endarterectomy site is widely patent without evidence of restenosis.  On the left side he had a carotid stent and this has some mild the elevated velocities with a less than 50% stenosis.  The right vertebral artery is patent with antegrade flow.  The left vertebral artery could not be identified.  MEDICAL ISSUES:   BILATERAL CAROTID DISEASE: The patient has widely patent carotids bilaterally.  He status post previous left carotid stent and right carotid endarterectomy.  The left carotid stent was done in [facility] [place].  I have ordered a follow-up carotid duplex scan in 1 year and I will see him back at that time.  He is on Plavix.  PERIPHERAL VASCULAR DISEASE: He has a patent right femoropopliteal bypass graft.  He has evidence of infrainguinal arterial occlusive disease bilaterally with minimal symptoms.  CHRONIC VENOUS INSUFFICIENCY: The cellulitis of his left leg has resolved.  He does have chronic venous insufficiency with hyperpigmentation consistent with CEAP clinical class 4a disease.  We have discussed the importance of intermittent leg elevation and the proper positioning for this.  He has been doing this daily.  Deitra [facility] [phone]

## 2018-07-07 ENCOUNTER — Encounter: Payer: Medicare Other | Admitting: Family Medicine

## 2018-07-10 ENCOUNTER — Other Ambulatory Visit: Payer: Self-pay | Admitting: Family Medicine

## 2018-07-12 NOTE — Telephone Encounter (Signed)
Electronic refill request Last office visit 07/01/18  Last refill 10/11/17  #30/3 See drug warning

## 2018-07-13 NOTE — Telephone Encounter (Signed)
Sent. Thanks.  Okay to continue. 

## 2018-07-20 ENCOUNTER — Ambulatory Visit: Payer: Medicare Other | Admitting: Family Medicine

## 2018-07-20 ENCOUNTER — Encounter (INDEPENDENT_AMBULATORY_CARE_PROVIDER_SITE_OTHER): Payer: Medicare Other | Admitting: Ophthalmology

## 2018-07-20 ENCOUNTER — Encounter: Payer: Self-pay | Admitting: Family Medicine

## 2018-07-20 VITALS — BP 110/64 | HR 82 | Temp 97.6°F | Ht 64.5 in | Wt 157.8 lb

## 2018-07-20 DIAGNOSIS — E785 Hyperlipidemia, unspecified: Secondary | ICD-10-CM | POA: Diagnosis not present

## 2018-07-20 DIAGNOSIS — H353221 Exudative age-related macular degeneration, left eye, with active choroidal neovascularization: Secondary | ICD-10-CM | POA: Diagnosis not present

## 2018-07-20 DIAGNOSIS — E1159 Type 2 diabetes mellitus with other circulatory complications: Secondary | ICD-10-CM

## 2018-07-20 DIAGNOSIS — Z9181 History of falling: Secondary | ICD-10-CM | POA: Diagnosis not present

## 2018-07-20 DIAGNOSIS — E039 Hypothyroidism, unspecified: Secondary | ICD-10-CM

## 2018-07-20 DIAGNOSIS — H35033 Hypertensive retinopathy, bilateral: Secondary | ICD-10-CM | POA: Diagnosis not present

## 2018-07-20 DIAGNOSIS — H353111 Nonexudative age-related macular degeneration, right eye, early dry stage: Secondary | ICD-10-CM | POA: Diagnosis not present

## 2018-07-20 DIAGNOSIS — H43813 Vitreous degeneration, bilateral: Secondary | ICD-10-CM

## 2018-07-20 DIAGNOSIS — H348112 Central retinal vein occlusion, right eye, stable: Secondary | ICD-10-CM

## 2018-07-20 DIAGNOSIS — I10 Essential (primary) hypertension: Secondary | ICD-10-CM

## 2018-07-20 NOTE — Patient Instructions (Addendum)
We may need to taper your sugar meds.  Try checking your sugar and let me know about that, especially if you feel jittery.   Find out which meter is covered and we can send an order.   Recheck labs in about 3 months at a visit.  You don't need to fast.  Take care.  Glad to see you.

## 2018-07-20 NOTE — Progress Notes (Signed)
Already has power mobility device.  He needs a new battery if he is going to continue to use the device. He may not qualify- he is using a walker now.   He had fallen earlier this year.  We talked about PT for fall prevention.  He'll let me know if he wants to go to PT.  We talked about HEP- he'll try that.    He prev qualified for power mobility device when he was much more debilitated.  Discussed.  Diabetes:  Using medications without difficulties: yes Hypoglycemic episodes: unclear, he had occ episodes of feeling jittery Hyperglycemic episodes:no Feet problems:  Blood Sugars averaging: not checked.   A1c 7.1   He'll get a meter.  D/w pt.  See AVS.    LDL at goal on current meds.   No adverse effect on current medication.  Labs discussed with patient.  Hypothyroidism.  TSH slightly high but likely not significant.  No neck mass.  PMH and SH reviewed  Meds, vitals, and allergies reviewed.   ROS: Per HPI unless specifically indicated in ROS section   GEN: nad, alert and oriented HEENT: mucous membranes moist NECK: supple w/o LA CV: rrr. PULM: ctab, no inc wob ABD: soft, +bs EXT: no edema SKIN: no acute rash  Diabetic foot exam: Normal inspection except for absent R5th toe No skin breakdown No calluses  Normal DP pulses Normal sensation to light touch and monofilament Nails thickened.

## 2018-07-21 DIAGNOSIS — Z9181 History of falling: Secondary | ICD-10-CM | POA: Insufficient documentation

## 2018-07-21 NOTE — Assessment & Plan Note (Signed)
Continue current lipid medications.  Labs discussed with patient.  He agrees.  No adverse effect on medication.

## 2018-07-21 NOTE — Assessment & Plan Note (Signed)
Minimal increase in TSH.  No thyromegaly on exam.  Discussed with patient about recheck TSH in about 3 months.  He agrees.  Likely not needed to change dose at this point.

## 2018-07-21 NOTE — Assessment & Plan Note (Signed)
He will check on getting a meter and start checking his blood sugar more often at home.  Unclear if he had some mild episodes of hypoglycemia where he felt slightly jittery.  He can check his blood pressure at home and update me.  We can taper his diabetic medications as needed.  Previous labs discussed with patient. >25 minutes spent in face to face time with patient, >50% spent in counselling or coordination of care.  Plan on recheck in a few months, sooner if needed.

## 2018-07-21 NOTE — Assessment & Plan Note (Signed)
Continue using walker.  Would not qualify for power mobility device at this point.  Discussed with patient.  He agrees.  When he did qualify years ago he was more debilitated.  He will restart home exercise program.  Update me as needed.  He agrees.

## 2018-07-26 ENCOUNTER — Encounter: Payer: Self-pay | Admitting: Family Medicine

## 2018-07-27 ENCOUNTER — Other Ambulatory Visit: Payer: Self-pay | Admitting: Family Medicine

## 2018-07-27 DIAGNOSIS — Z9181 History of falling: Secondary | ICD-10-CM

## 2018-08-01 ENCOUNTER — Telehealth: Payer: Self-pay | Admitting: Family Medicine

## 2018-08-01 NOTE — Telephone Encounter (Signed)
Thanks. Noted.

## 2018-08-01 NOTE — Telephone Encounter (Signed)
Copied from [place] [id number]. Topic: Quick Communication - See Telephone Encounter >> Aug 01, 2018  8:45 AM Conception Chancy, NT wrote: CRM for notification. See Telephone encounter for: 08/01/18.  Benjamine Mola is a Marine scientist with [facility] calling and states they received the physical therapy form. She will get services started tomorrow 08/02/18.  [phone]

## 2018-08-17 DIAGNOSIS — E1159 Type 2 diabetes mellitus with other circulatory complications: Secondary | ICD-10-CM

## 2018-08-17 DIAGNOSIS — R262 Difficulty in walking, not elsewhere classified: Secondary | ICD-10-CM

## 2018-08-17 DIAGNOSIS — Z7984 Long term (current) use of oral hypoglycemic drugs: Secondary | ICD-10-CM

## 2018-08-17 DIAGNOSIS — Z789 Other specified health status: Secondary | ICD-10-CM

## 2018-08-17 DIAGNOSIS — J449 Chronic obstructive pulmonary disease, unspecified: Secondary | ICD-10-CM

## 2018-08-17 DIAGNOSIS — M6281 Muscle weakness (generalized): Secondary | ICD-10-CM

## 2018-08-17 DIAGNOSIS — Z9181 History of falling: Secondary | ICD-10-CM

## 2018-08-17 DIAGNOSIS — I69398 Other sequelae of cerebral infarction: Secondary | ICD-10-CM

## 2018-08-17 DIAGNOSIS — Z7902 Long term (current) use of antithrombotics/antiplatelets: Secondary | ICD-10-CM

## 2018-10-09 ENCOUNTER — Other Ambulatory Visit: Payer: Self-pay | Admitting: Family Medicine

## 2018-10-21 ENCOUNTER — Ambulatory Visit: Payer: Medicare Other | Admitting: Family Medicine

## 2018-10-21 ENCOUNTER — Encounter: Payer: Self-pay | Admitting: Family Medicine

## 2018-10-21 VITALS — BP 110/64 | HR 84 | Temp 97.5°F | Ht 64.5 in | Wt 155.5 lb

## 2018-10-21 DIAGNOSIS — E1159 Type 2 diabetes mellitus with other circulatory complications: Secondary | ICD-10-CM | POA: Diagnosis not present

## 2018-10-21 DIAGNOSIS — E039 Hypothyroidism, unspecified: Secondary | ICD-10-CM | POA: Diagnosis not present

## 2018-10-21 DIAGNOSIS — Z9181 History of falling: Secondary | ICD-10-CM

## 2018-10-21 LAB — HEMOGLOBIN A1C: Hgb A1c MFr Bld: 6.6 % — ABNORMAL HIGH (ref 4.6–6.5)

## 2018-10-21 LAB — TSH: TSH: 3.63 u[IU]/mL (ref 0.35–4.50)

## 2018-10-21 NOTE — Progress Notes (Signed)
Diabetes:  Using medications without difficulties: yes Hypoglycemic episodes: no sx Hyperglycemic episodes: no sx Feet problems: no acute changes.  See exam below.   Blood Sugars averaging: meter is broken, see avs.  Labs pending.   Hypothyroidism.  Due for f/u TSH.  Compliant.   His wife had a CVA and this was a sig change.  She is back home now, d/w pt.  He did PT for fall prevention.  Discussed HEP.  Fall cautions d/w pt.  Using walker at baseline.    Meds, vitals, and allergies reviewed.  ROS: Per HPI unless specifically indicated in ROS section   GEN: nad, alert and oriented HEENT: mucous membranes moist NECK: supple w/o LA CV: rrr. PULM: ctab, no inc wob ABD: soft, +bs EXT: trace, almost no, BLE edema SKIN: no acute rash  Diabetic foot exam: Normal inspection except for absent R5th toe.   No skin breakdown No calluses  Normal DP pulses Normal sensation to light touch and monofilament L 1st nail absent.

## 2018-10-21 NOTE — Patient Instructions (Addendum)
Check to see which meter is covered and I can send in an order for that.   Go to the lab on the way out.  We'll contact you with your lab report. Plan on a recheck in about 3 months.  We can do labs at the visit.  Take care.  Glad to see you.

## 2018-10-23 NOTE — Assessment & Plan Note (Signed)
See notes on A1c.  Discussed with patient about checking to see which meter is covered by his insurance and then we can send that in, if he will let us know.  Goal is to avoid hypoglycemia.  No changes in meds at the office visit.  See after visit summary.  He agrees with plan.

## 2018-10-23 NOTE — Assessment & Plan Note (Signed)
See notes on follow-up TSH. 

## 2018-10-23 NOTE — Assessment & Plan Note (Signed)
Continue using a walker.  Home exercise program discussed.  He previously did PT for fall prevention.

## 2018-10-27 ENCOUNTER — Encounter: Payer: Self-pay | Admitting: Family Medicine

## 2018-10-27 ENCOUNTER — Other Ambulatory Visit: Payer: Self-pay | Admitting: *Deleted

## 2018-10-27 MED ORDER — BLOOD GLUCOSE METER KIT: PACK | 0 refills | Status: DC

## 2018-11-10 LAB — HM DIABETES EYE EXAM

## 2018-11-11 ENCOUNTER — Encounter (INDEPENDENT_AMBULATORY_CARE_PROVIDER_SITE_OTHER): Payer: Medicare Other | Admitting: Ophthalmology

## 2018-11-11 DIAGNOSIS — H43813 Vitreous degeneration, bilateral: Secondary | ICD-10-CM

## 2018-11-11 DIAGNOSIS — H353111 Nonexudative age-related macular degeneration, right eye, early dry stage: Secondary | ICD-10-CM | POA: Diagnosis not present

## 2018-11-11 DIAGNOSIS — H35033 Hypertensive retinopathy, bilateral: Secondary | ICD-10-CM

## 2018-11-11 DIAGNOSIS — H348112 Central retinal vein occlusion, right eye, stable: Secondary | ICD-10-CM

## 2018-11-11 DIAGNOSIS — H353221 Exudative age-related macular degeneration, left eye, with active choroidal neovascularization: Secondary | ICD-10-CM

## 2018-11-11 DIAGNOSIS — I10 Essential (primary) hypertension: Secondary | ICD-10-CM | POA: Diagnosis not present

## 2018-11-17 ENCOUNTER — Encounter: Payer: Self-pay | Admitting: Emergency Medicine

## 2018-11-17 ENCOUNTER — Ambulatory Visit: Payer: Medicare Other | Admitting: Emergency Medicine

## 2018-11-17 DIAGNOSIS — R059 Cough, unspecified: Secondary | ICD-10-CM

## 2018-11-17 DIAGNOSIS — R05 Cough: Secondary | ICD-10-CM

## 2018-11-17 MED ORDER — IPRATROPIUM-ALBUTEROL 0.5-2.5 (3) MG/3ML IN SOLN: 3.0000 mL | Freq: Three times a day (TID) | RESPIRATORY_TRACT | 2 refills | Status: DC

## 2018-11-17 MED ORDER — UMECLIDINIUM-VILANTEROL 62.5-25 MCG/INH IN AEPB: 1.0000 | INHALATION_SPRAY | Freq: Every day | RESPIRATORY_TRACT | 0 refills | Status: DC

## 2018-11-17 NOTE — Progress Notes (Signed)
  Subjective:    Patient ID: Dylan Knight, male    DOB: 13-Nov-1923, 85 y.o.   MRN: [id number]  HPI  ROV 04/18/18 --85 year old gentleman with a history of COPD, peripheral vascular disease, hypertension, diabetes.  I have followed him for his COPD, frequent exacerbations.  He also has allergic rhinitis and deals with chronic mucus, chronic cough. He has advair and is using reliably. He has duoneb available but does not use very often - his wife believes that he should more frequently.   ROV 11/17/18 --[name] is 85 has a history of PVD, hypertension, diabetes.  He is followed here for COPD last seen in June.  He has been managed on Advair but he hasn't been taking reliably, uses DuoNeb as needed. He reports using it only rarely - wants a new script as his supply is old.  He is on loratadine and Mucinex for allergic congestion and chest congestion. He reports that he is breathing fairly well. He has had some sneezing, some increased UA noise.       Objective:   Physical Exam  Vitals:   11/17/18 1008  BP: 130/64  Pulse: 83  SpO2: 92%  Weight: 161 lb (73 kg)  Height: 5\' 6"  (1.676 m)   Gen: Pleasant, elderly man, in no distress,  normal affect  ENT: No lesions,  mouth clear,  oropharynx clear, no postnasal drip  Neck: No JVD, some upper airway mucus that he needs to clear, no overt stridor  Lungs: No use of accessory muscles, distant, no wheeze on a normal respiration but he does wheeze on forced expiration  Cardiovascular: RRR, heart sounds normal, no murmur or gallops, no peripheral edema  Musculoskeletal: No deformities, no cyanosis or clubbing  Neuro: alert, awake, somewhat poor memory  Skin: Warm, no lesions or rash     Assessment & Plan:  COPD (chronic obstructive pulmonary disease) ([place]) He is fairly asymptomatic currently but he is wheezing and is on no good maintenance therapy.  He was supposed to be on Advair but he does not remember to take it.  He has not used his  DuoNeb.  I think we need to start him on good maintenance therapy and I like to switch him to Anoro since he is tolerated powdered mechanism before.  Encouraged him to use his DuoNeb as needed we will refill this.  His immunizations are up-to-date  Cough With allergic rhinitis.  He does take his loratadine and Mucinex and have asked him to continue this.  Baltazar Apo, MD, PhD 11/17/2018, 10:35 AM [facility] [phone] or if no answer [phone]

## 2018-11-17 NOTE — Assessment & Plan Note (Signed)
With allergic rhinitis.  He does take his loratadine and Mucinex and have asked him to continue this.

## 2018-11-17 NOTE — Patient Instructions (Addendum)
We will stop Advair.  Start Anoro, one inhalation once a day.  We will refill your prescription for DuoNeb. Use this medication up to every 6 hours if needed for shortness of breath, wheezing, chest tightness, cough. Flu shot is up-to-date, pneumonia shot is up-to-date Follow with Dr Lamonte Sakai in 4 months or sooner if you have any problems.

## 2018-11-17 NOTE — Assessment & Plan Note (Signed)
He is fairly asymptomatic currently but he is wheezing and is on no good maintenance therapy.  He was supposed to be on Advair but he does not remember to take it.  He has not used his DuoNeb.  I think we need to start him on good maintenance therapy and I like to switch him to Anoro since he is tolerated powdered mechanism before.  Encouraged him to use his DuoNeb as needed we will refill this.  His immunizations are up-to-date

## 2018-12-01 ENCOUNTER — Encounter: Payer: Self-pay | Admitting: Family Medicine

## 2018-12-05 ENCOUNTER — Other Ambulatory Visit: Payer: Self-pay | Admitting: Family Medicine

## 2018-12-29 ENCOUNTER — Encounter: Payer: Self-pay | Admitting: Family Medicine

## 2018-12-30 ENCOUNTER — Other Ambulatory Visit: Payer: Self-pay | Admitting: *Deleted

## 2018-12-30 MED ORDER — LEVOTHYROXINE SODIUM 25 MCG PO TABS: ORAL_TABLET | ORAL | 1 refills | Status: DC

## 2019-01-14 ENCOUNTER — Other Ambulatory Visit: Payer: Self-pay | Admitting: Family Medicine

## 2019-01-19 ENCOUNTER — Other Ambulatory Visit: Payer: Self-pay | Admitting: *Deleted

## 2019-01-19 ENCOUNTER — Ambulatory Visit (INDEPENDENT_AMBULATORY_CARE_PROVIDER_SITE_OTHER): Payer: Medicare Other | Admitting: Family Medicine

## 2019-01-19 ENCOUNTER — Other Ambulatory Visit: Payer: Self-pay

## 2019-01-19 ENCOUNTER — Encounter: Payer: Self-pay | Admitting: Family Medicine

## 2019-01-19 VITALS — BP 132/66 | HR 86 | Temp 97.5°F | Ht 66.0 in | Wt 157.0 lb

## 2019-01-19 DIAGNOSIS — E1159 Type 2 diabetes mellitus with other circulatory complications: Secondary | ICD-10-CM | POA: Diagnosis not present

## 2019-01-19 DIAGNOSIS — Z9181 History of falling: Secondary | ICD-10-CM | POA: Diagnosis not present

## 2019-01-19 DIAGNOSIS — R451 Restlessness and agitation: Secondary | ICD-10-CM

## 2019-01-19 DIAGNOSIS — R269 Unspecified abnormalities of gait and mobility: Secondary | ICD-10-CM | POA: Diagnosis not present

## 2019-01-19 DIAGNOSIS — E119 Type 2 diabetes mellitus without complications: Secondary | ICD-10-CM | POA: Diagnosis not present

## 2019-01-19 LAB — COMPREHENSIVE METABOLIC PANEL
ALT: 12 U/L (ref 0–53)
AST: 22 U/L (ref 0–37)
Albumin: 4.7 g/dL (ref 3.5–5.2)
Alkaline Phosphatase: 43 U/L (ref 39–117)
BUN: 16 mg/dL (ref 6–23)
CO2: 28 mEq/L (ref 19–32)
Calcium: 10.3 mg/dL (ref 8.4–10.5)
Chloride: 95 mEq/L — ABNORMAL LOW (ref 96–112)
Creatinine, Ser: 1.28 mg/dL (ref 0.40–1.50)
GFR: 52.44 mL/min — ABNORMAL LOW (ref >60.00)
Glucose, Bld: 130 mg/dL — ABNORMAL HIGH (ref 70–99)
Potassium: 4.1 mEq/L (ref 3.5–5.1)
Sodium: 133 mEq/L — ABNORMAL LOW (ref 135–145)
Total Bilirubin: 1.4 mg/dL — ABNORMAL HIGH (ref 0.2–1.2)
Total Protein: 7 g/dL (ref 6.0–8.3)

## 2019-01-19 LAB — CBC WITH DIFFERENTIAL/PLATELET
Basophils Absolute: 0 10*3/uL (ref 0.0–0.1)
Basophils Relative: 0.4 % (ref 0.0–3.0)
Eosinophils Absolute: 0.3 10*3/uL (ref 0.0–0.7)
Eosinophils Relative: 3.3 % (ref 0.0–5.0)
HCT: 48.2 % (ref 39.0–52.0)
Hemoglobin: 16.6 g/dL (ref 13.0–17.0)
Lymphocytes Relative: 10.2 % — ABNORMAL LOW (ref 12.0–46.0)
Lymphs Abs: 0.9 10*3/uL (ref 0.7–4.0)
MCHC: 34.5 g/dL (ref 30.0–36.0)
MCV: 100.9 fl — ABNORMAL HIGH (ref 78.0–100.0)
Monocytes Absolute: 0.9 10*3/uL (ref 0.1–1.0)
Monocytes Relative: 10.9 % (ref 3.0–12.0)
Neutro Abs: 6.4 10*3/uL (ref 1.4–7.7)
Neutrophils Relative %: 75.2 % (ref 43.0–77.0)
Platelets: 206 10*3/uL (ref 150.0–400.0)
RBC: 4.78 Mil/uL (ref 4.22–5.81)
RDW: 13.8 % (ref 11.5–15.5)
WBC: 8.5 10*3/uL (ref 4.0–10.5)

## 2019-01-19 LAB — TSH: TSH: 4.76 u[IU]/mL — ABNORMAL HIGH (ref 0.35–4.50)

## 2019-01-19 LAB — HEMOGLOBIN A1C: Hgb A1c MFr Bld: 6.9 % — ABNORMAL HIGH (ref 4.6–6.5)

## 2019-01-19 MED ORDER — GLUCOSE BLOOD VI STRP: ORAL_STRIP | 3 refills | Status: DC

## 2019-01-22 ENCOUNTER — Encounter: Payer: Self-pay | Admitting: Family Medicine

## 2019-01-22 NOTE — Progress Notes (Signed)
Lower back pain.  Walking with walker at baseline.  He and his wife were asking about getting a lift chair.  I discouraged this because it generally increases the fall risk for patient.  We talked about getting physical therapy set up to try to help him with his gait abnormality and overall stability as that would likely be more useful.  We will try to get home health physical therapy set up.  He has been episodically waking up frightened.  He has been restless at night over the last month.  His sleep cycle has been affected.  He is not on any new medications that would trigger the changes described above.  His sugars been controlled on previous checks but then his meter broke and he is going to check on getting a replacement meter.  We talked about getting a fall button to increase his relative level of safety at home.  We also talked about them looking into getting extra help at home.  He may also benefit from seeing a chiropractor, if he could help with his lower back pain.  He still has some trace bilateral lower extremity edema but no cellulitis recently noted.  PMH and SH reviewed  ROS: Per HPI unless specifically indicated in ROS section   Meds, vitals, and allergies reviewed.   GEN: nad, alert and oriented HEENT: mucous membranes moist NECK: supple w/o LA CV: rrr PULM: ctab, no inc wob ABD: soft, +bs EXT: Trace bilateral lower extremity edema SKIN: no acute rash no sign of cellulitis on the lower extremities. Walking with walker at baseline

## 2019-01-23 ENCOUNTER — Encounter: Payer: Self-pay | Admitting: Family Medicine

## 2019-01-23 ENCOUNTER — Other Ambulatory Visit: Payer: Self-pay | Admitting: Family Medicine

## 2019-01-23 DIAGNOSIS — R451 Restlessness and agitation: Secondary | ICD-10-CM | POA: Insufficient documentation

## 2019-01-23 NOTE — Assessment & Plan Note (Signed)
Reasonable to get a fall button.  Family can look into getting a sitter to help at home.  I discouraged him from getting a lift chair as it may increase his fall risk.

## 2019-01-23 NOTE — Assessment & Plan Note (Signed)
See notes on labs.  A1c done at visit.

## 2019-01-23 NOTE — Assessment & Plan Note (Signed)
Before changing any of his medicines around I thought was reasonable to recheck his basic labs first.  Check CBC c-Met TSH.  He agrees.  See notes on labs. >25 minutes spent in face to face time with patient, >50% spent in counselling or coordination of care.

## 2019-01-23 NOTE — Assessment & Plan Note (Signed)
Reasonable to restart home health PT.  He was asking about getting set up with [facility].  They had helped him previously.

## 2019-01-29 ENCOUNTER — Encounter: Payer: Self-pay | Admitting: Family Medicine

## 2019-01-31 ENCOUNTER — Other Ambulatory Visit: Payer: Self-pay | Admitting: Family Medicine

## 2019-01-31 MED ORDER — CEPHALEXIN 500 MG PO CAPS: 500.0000 mg | ORAL_CAPSULE | Freq: Two times a day (BID) | ORAL | 0 refills | Status: DC

## 2019-02-01 ENCOUNTER — Telehealth: Payer: Self-pay | Admitting: Family Medicine

## 2019-02-01 ENCOUNTER — Telehealth: Payer: Self-pay

## 2019-02-01 NOTE — Telephone Encounter (Signed)
Please give the order.  Thanks.   

## 2019-02-01 NOTE — Telephone Encounter (Signed)
See my chart message from patient's wife.  Late entry.  I called his wife yesterday.  He had gradually gotten worse since the last office visit.  He has had episodic confusion and panic symptoms with sleep changes noted.  No fevers.  He is not short of breath.  The only specific focal change appears to be a change in urination.  He is having frequency with decreased voiding per episode.  His wife reports that he has not stated any history of painful urination.  We talked about options.  Given the pandemic, she was worried about coming to the office/leaving her home.  I get her point.  Under normal circumstances we would examine the patient face-to-face and check his urine.  Given recent extraordinary circumstances, it may make sense to just start Keflex in the meantime for possible/presumed cystitis and then update me in a day or two, sooner if needed.  She agreed with plan.  I appreciate the help of all involved.  We have been checking on getting home health set up.  That is pending.  She was aware.

## 2019-02-01 NOTE — Telephone Encounter (Signed)
Ben PT with [facility] left v/m requesting verbal orders for [facility] PT 2 x a wk for 3 wks and 1 x a wk for 1 wk. Also HH OT, [place] skilled nursing and  [place] social worker evaluations.Please advise.

## 2019-02-02 NOTE — Telephone Encounter (Signed)
Ben with [facility] advised.

## 2019-02-03 ENCOUNTER — Telehealth: Payer: Self-pay | Admitting: Family Medicine

## 2019-02-03 ENCOUNTER — Encounter: Payer: Self-pay | Admitting: Family Medicine

## 2019-02-03 MED ORDER — TAMSULOSIN HCL 0.4 MG PO CAPS: 0.4000 mg | ORAL_CAPSULE | Freq: Every day | ORAL | 3 refills | Status: DC

## 2019-02-03 NOTE — Telephone Encounter (Signed)
If he has absolute urinary retention and cannot produce any urine at all, then needs ER ASAP re: foley placement.  O/w would try adding on flomax to see if that helps.  Some of his sx could be related to BPH.  If fever or feeling worse then let me know.  Thanks.   rx sent.

## 2019-02-03 NOTE — Telephone Encounter (Signed)
Need verbal order for Nursing    2 times  for 2 wk  1 times for 4wk

## 2019-02-03 NOTE — Telephone Encounter (Signed)
Please give the order.  Thanks.  And please get update on patient.  See last phone note.  Started on keflex for possible UTI.

## 2019-02-03 NOTE — Telephone Encounter (Signed)
Wife advised.  Patient is producing a little urine but not much.  Patient has no fever and is not in pain with urination.  Advised wife that Rx has been sent in to the pharmacy and that if things get worse, he will need to go to ER.

## 2019-02-03 NOTE — Telephone Encounter (Signed)
Order given to [place].  Wife says the [facility] nurse has been out this morning.  Patient is on the medication but was up all night trying to urinate.  Patient feels the urge but can't urinate.

## 2019-02-03 NOTE — Telephone Encounter (Signed)
Noted. Thanks.

## 2019-02-06 ENCOUNTER — Telehealth: Payer: Self-pay | Admitting: *Deleted

## 2019-02-06 DIAGNOSIS — R300 Dysuria: Secondary | ICD-10-CM

## 2019-02-06 NOTE — Telephone Encounter (Signed)
Please give the order.  I thank all involved and appreciate PT for helping with fall risk reduction.  Was the patient able to start flomax/tamsulosin and if so has there been any improvement in urination?  Please let me know.  Thanks.

## 2019-02-06 NOTE — Telephone Encounter (Signed)
[employer] Social Worker with [facility] called stating that she is out with patient now.  Millie requested a verbal order to go back out for a follow-up visit. Millie stated that the patient is having panic attacks and not sleeping because he is getting up during the night to go to the bathroom and is not able to do anything. Millie stated that the patient has fallen several times, has no injuries, but som bruising. Millie stated that she is going to reach out to physical therapy regarding the patient's falls.

## 2019-02-07 ENCOUNTER — Encounter: Payer: Self-pay | Admitting: Family Medicine

## 2019-02-07 DIAGNOSIS — R300 Dysuria: Secondary | ICD-10-CM | POA: Insufficient documentation

## 2019-02-07 NOTE — Telephone Encounter (Signed)
Faxed order to [facility] at [phone].  Spoke with pt's wife, [name] (on dpr), relaying Dr. Josefine Class message.  Verbalizes understanding and expresses her thanks. [name] wants to make Dr. Damita Dunnings aware pt urinated 1 cupful at 2:05.  Says he went at 10:00 and 11:00 this morning. Fyi to Dr. Damita Dunnings.

## 2019-02-07 NOTE — Telephone Encounter (Signed)
Was speaking to [facility] about a pt of Dr. Marliss Coots and she wanted me to check the status of this pt. I gave the verbal order to her but she did want me to let Dr. Damita Dunnings know that pt hasn't had any improvement in his urination issues. She said pt's urine is also very dark and there is a significant odor. Millie ? If you would like to send in a order for them to cath pt. I asked [employer] is that something he can do a verbal order for and she said no it would have to be an order faxed to [facility] directly [phone], I told her I would relay the message and Dr. Josefine Class assistant will call her back

## 2019-02-07 NOTE — Telephone Encounter (Signed)
Noted.  Thanks, will await ucx results.

## 2019-02-07 NOTE — Telephone Encounter (Signed)
Please call wife/pt and tell them I think it makes sense to get in/out cath done once.    Order done, printed.  Please fax.   Proceed with in and out cath.   Please have them check the total amount of urine output with in and out cath and update me.   Collect sample for ucx in sterile container.  Lab ordered.   We'll go from there.  Thanks.

## 2019-02-09 ENCOUNTER — Telehealth: Payer: Self-pay

## 2019-02-09 NOTE — Telephone Encounter (Signed)
Inbound call to triage line - PT Dylan Knight reports patient had fall on 02/09/19. Per PT Dylan Knight, patient has had multiple falls on 02/05/19, 02/06/19, and 02/07/19. Bruising present. Ambulating with walker. Very lethargic, easily falls asleep. Patient finished antibiotics for UTI on 02/08/19.   Writer plans to contact spouse when daughter comes at 1PM to schedule virtual video appointment with PCP.

## 2019-02-09 NOTE — Telephone Encounter (Deleted)
Patient's wife returned Lesia's call.  Please call her back at [phone].

## 2019-02-10 ENCOUNTER — Other Ambulatory Visit: Payer: Self-pay

## 2019-02-10 ENCOUNTER — Ambulatory Visit (INDEPENDENT_AMBULATORY_CARE_PROVIDER_SITE_OTHER): Payer: Medicare Other | Admitting: Family Medicine

## 2019-02-10 ENCOUNTER — Encounter: Payer: Self-pay | Admitting: Family Medicine

## 2019-02-10 DIAGNOSIS — R339 Retention of urine, unspecified: Secondary | ICD-10-CM

## 2019-02-10 DIAGNOSIS — Z8673 Personal history of transient ischemic attack (TIA), and cerebral infarction without residual deficits: Secondary | ICD-10-CM

## 2019-02-10 DIAGNOSIS — B359 Dermatophytosis, unspecified: Secondary | ICD-10-CM | POA: Diagnosis not present

## 2019-02-10 DIAGNOSIS — E1159 Type 2 diabetes mellitus with other circulatory complications: Secondary | ICD-10-CM

## 2019-02-10 DIAGNOSIS — Z7984 Long term (current) use of oral hypoglycemic drugs: Secondary | ICD-10-CM

## 2019-02-10 DIAGNOSIS — Z7902 Long term (current) use of antithrombotics/antiplatelets: Secondary | ICD-10-CM

## 2019-02-10 DIAGNOSIS — Z9181 History of falling: Secondary | ICD-10-CM

## 2019-02-10 DIAGNOSIS — N390 Urinary tract infection, site not specified: Secondary | ICD-10-CM

## 2019-02-10 DIAGNOSIS — R451 Restlessness and agitation: Secondary | ICD-10-CM | POA: Diagnosis not present

## 2019-02-10 DIAGNOSIS — M545 Low back pain: Secondary | ICD-10-CM

## 2019-02-10 DIAGNOSIS — J439 Emphysema, unspecified: Secondary | ICD-10-CM

## 2019-02-10 DIAGNOSIS — R269 Unspecified abnormalities of gait and mobility: Secondary | ICD-10-CM

## 2019-02-10 MED ORDER — ALPRAZOLAM 0.25 MG PO TABS: 0.1250 mg | ORAL_TABLET | Freq: Every evening | ORAL | 0 refills | Status: DC | PRN

## 2019-02-10 MED ORDER — TAMSULOSIN HCL 0.4 MG PO CAPS: 0.8000 mg | ORAL_CAPSULE | Freq: Every day | ORAL | 3 refills | Status: DC

## 2019-02-10 MED ORDER — NYSTATIN 100000 UNIT/GM EX POWD: Freq: Three times a day (TID) | CUTANEOUS | 1 refills | Status: DC

## 2019-02-10 NOTE — Telephone Encounter (Signed)
Thanks. Noted.  He is scheduled for later today.

## 2019-02-10 NOTE — Progress Notes (Signed)
Virtual visit completed through WebEx.  Patient location: home  Provider location: [place] at [facility], office   Call with wife, patient, and daughter.    Urinary retention.  He had in and out cath done with 700cc urine output.  He felt better after that.  ucx is pending.  He is still on flomax 0.4mg  QD.  No adverse effect to medication.  Discussed options.  No fevers.    He was up 13 times last night.  Slept better the previous night, after I&O cath.    He fell yesterday, fall cautions discussed with patient and wife and family. Sleep deprivation is likely an issue, related to urinary retention.  He has a bruise noted on the right flank in the meantime.  Meds, vitals, and allergies reviewed.   ROS: Per HPI unless specifically indicated in ROS section   nad Speaking in complete sentences but hard of hearing, at baseline Bruise R flank, diffuse.  Not sore unless putting pressure on the area.   R groin with tinea rash.

## 2019-02-12 ENCOUNTER — Telehealth: Payer: Self-pay | Admitting: Family Medicine

## 2019-02-12 DIAGNOSIS — B356 Tinea cruris: Secondary | ICD-10-CM | POA: Insufficient documentation

## 2019-02-12 DIAGNOSIS — B359 Dermatophytosis, unspecified: Secondary | ICD-10-CM | POA: Insufficient documentation

## 2019-02-12 NOTE — Assessment & Plan Note (Signed)
Looks to have a tinea rash in the groin.  Can use nystatin and update me as needed.

## 2019-02-12 NOTE — Assessment & Plan Note (Addendum)
700 cc urine output after previous in and out catheterization.  Urine culture pending.  We sent order to home health for repeat in and out cath if needed.  We can get the family taught how to perform this if needed.  Increase Flomax to 0.8 mg a day for now.  Family to update me in the near future.  >25 minutes spent in face to face time with patient, >50% spent in counselling or coordination of care.

## 2019-02-12 NOTE — Assessment & Plan Note (Addendum)
Fall cautions discussed with patient and family.  We talked about options.  I think the sleep deprivation related urinary retention is likely exacerbating his situation.  Hopefully by treating his urinary retention he will be able to get more sleep at night and that will improve his situation overall and decrease his risk for falls.  Discussed.  I would expect his bruising to gradually resolve.

## 2019-02-12 NOTE — Telephone Encounter (Signed)
Please get update on patient on Monday.  Thanks. 

## 2019-02-12 NOTE — Assessment & Plan Note (Signed)
Exacerbated by urinary retention and sleep deprivation.  We talked about options in case he got panicked.  He can get another in and out catheterization done.  If he is still having profound restlessness in spite of that and the higher dose of Flomax then he can use a very low dose of Xanax at night to try to get some rest.  Fall cautions discussed with patient.  I did not want to put him on any other medication that could increase the risk for urinary retention.  Rationale discussed with patient and family and plan agreed.

## 2019-02-13 ENCOUNTER — Telehealth: Payer: Self-pay | Admitting: *Deleted

## 2019-02-13 NOTE — Telephone Encounter (Signed)
Received faxed from [place] on  02/11/19 that Dylan Knight from [facility] called to get an order to go see patient who is severely constipated, See fax that she spoke with on call provider. Fax is on your desk.

## 2019-02-13 NOTE — Telephone Encounter (Signed)
Spoke to pt's wife, [name]. He did well on Saturday night. But, last night he was up frequently and could never do more than a dribble of urine. Had a BM on Saturday after taking M.O.M but nothing since.

## 2019-02-13 NOTE — Telephone Encounter (Signed)
I am glad he had at least one decent night.  See if home health can do a repeat I&O cath today, if urine output doesn't pick up with higher dose of flomax.  Thanks.

## 2019-02-13 NOTE — Telephone Encounter (Signed)
Recent BM noted after MOM use, per prev notes.  Okay with me to give order for them to see patient if constipation returns.  Thanks.

## 2019-02-13 NOTE — Telephone Encounter (Addendum)
Verbal orders given to [facility] at [facility] for the I&O Cath

## 2019-02-14 ENCOUNTER — Telehealth: Payer: Self-pay

## 2019-02-14 NOTE — Telephone Encounter (Addendum)
Left message on voicemail for Dylan Knight to call back and ask to speak to triage.

## 2019-02-14 NOTE — Telephone Encounter (Signed)
Spoke with [name] [facility] nurse) regarding Dr. Josefine Class instructions.  [name] discussed with patient's wife.    Patient has voided a good amount of urine X 3 this am per wifes report.  Per Dr. Damita Dunnings at this point given that patient is voiding on his own now, they should continue to monitor his output.  If he stops urinating and/or develops any increasing concerning symptoms then he should go to ER. Otherwise continue to monitor and update PCP as to how patient is doing.    We will place all other plans on hold at this point pending how patient is progressing with output.  [name] made aware and will discuss with wife and call back if any further concerns.

## 2019-02-14 NOTE — Telephone Encounter (Signed)
[name] nurse with [facility] said she did in and out cath this AM with no return of urine.There was no resistance when doing cath. Pt tried to void after I&O cath and pt voided approx 10 cc. Last urination was 12 midnight. Pt got up 7 times during the night to urinate with no results. Pt is drinking, no abd distention, good bowel sounds. T 97.9 and BP 126/72, pulse ox 100%. Pt does not appear in any distress or pain; some shallow breathing and anxiousness which is pts normal levels. [name] has left the home but request cb with further instructions.

## 2019-02-14 NOTE — Telephone Encounter (Addendum)
Please call home health/family and have them place foley with leg bag.   If the patient has urine return in foley, then leave foley/bag in place at least until tomorrow and update me tomorrow.  If the patient has no urine in the foley, then he is going to need ER eval since I can't do a bladder scan here.    I don't have a foley here in clinic to use, so coming to the clinic here will not change the plan or add a benefit.    The other option is to have him go straight to the ER.    I an uncertain why the most recent I&O cath didn't yield more/any urine.  That theoretically could have been from prostate enlargement with compression of the catheter.    My thoughts at this point are that he likely has sleep deprivation due to urinary frequency that is likely a result of prostate enlargement, given his prev 700cc on initial I&O cath.    Thanks.

## 2019-02-15 ENCOUNTER — Encounter: Payer: Self-pay | Admitting: Family Medicine

## 2019-02-15 ENCOUNTER — Telehealth: Payer: Self-pay | Admitting: Family Medicine

## 2019-02-15 MED ORDER — ALPRAZOLAM 0.25 MG PO TABS: ORAL_TABLET | ORAL | 0 refills | Status: DC

## 2019-02-15 NOTE — Telephone Encounter (Signed)
Unable to reach [place] and I spoke with pts wife pt has had urine output today and pt has gone several times to urinate today and each time is 1/2 c; pt was up and down all night long last night;pt was not anxious but was restless and could not sleep last night except for small periods of time.  Mrs Poffenberger wanted to know could give pt a whole alprazolam 0.25 mg to help him rest at night. Mrs Zerby said the [facility] nurse is supposed to come to see pt this afternoon. Mrs Schweikert has sent 2 pt messages today as well. Mrs Venn request cb today.

## 2019-02-15 NOTE — Telephone Encounter (Signed)
Please get an update on patient, about how he slept last night and about his urine output today.   Thanks.

## 2019-02-15 NOTE — Telephone Encounter (Signed)
Best number [phone]  [name] @ [facility] called wanting to get the mg of xanax pt is to take. She stated she forgot what regina said

## 2019-02-15 NOTE — Telephone Encounter (Signed)
I was unable to reach Dylan Knight with [facility] and I spoke with Dylan Knight and notified her as instructed and she voiced understanding and was very appreciative. Dylan Knight arrived at pts home and I spoke with Dylan Knight nurse with [facility]; she was notified as instructed and voiced understanding and she said she would monitor pts urine output and how he is doing and she will keep Dr Damita Dunnings updated. FYI to Dr Damita Dunnings.

## 2019-02-15 NOTE — Telephone Encounter (Signed)
[name] from [employer]...[phone]

## 2019-02-15 NOTE — Telephone Encounter (Signed)
[name] nurse with [facility] notified that Dr Damita Dunnings said OK for pt to take 1 tab (0.25 mg) of alprazolam(xanax) if needed at hs to help sleep or anxiety. Med list updated. [name] voiced understanding.

## 2019-02-15 NOTE — Telephone Encounter (Signed)
I just saw the mychart messages.  Would be okay to use a whole xanax at night if needed.  If his UOP doesn't pick up and he keeps having urinary frequency, then I think he'll need a foley placed with a leg bag attached.  We would need him to see urology also.    If the flomax makes a huge difference and he is voiding well, then we can defer on the foley and the urology referral.   While I hope it isn't the case, I think he is going to end up needing a foley/urol eval.  Thanks.   Brigitte Pulse

## 2019-02-15 NOTE — Telephone Encounter (Signed)
Thanks

## 2019-02-22 ENCOUNTER — Encounter: Payer: Self-pay | Admitting: Family Medicine

## 2019-02-22 ENCOUNTER — Other Ambulatory Visit: Payer: Self-pay | Admitting: Family Medicine

## 2019-02-22 NOTE — Telephone Encounter (Signed)
Electronic refill request Alprazolam Last office visit 02/10/19 Last refill 02/15/19 #10

## 2019-02-22 NOTE — Telephone Encounter (Signed)
Sent. Thanks.   

## 2019-02-22 NOTE — Telephone Encounter (Signed)
Message left for Santiago Glad to return my call.

## 2019-02-24 NOTE — Telephone Encounter (Signed)
Message left for Santiago Glad to return my call.

## 2019-02-28 NOTE — Telephone Encounter (Addendum)
Left the 3rd message. Will close encounter since this was first initialed 2 weeks ago  Also have spoken to patient's wife and she stated that patient is doing okay on MOM.

## 2019-03-10 ENCOUNTER — Other Ambulatory Visit: Payer: Self-pay | Admitting: Family Medicine

## 2019-04-14 ENCOUNTER — Other Ambulatory Visit: Payer: Self-pay

## 2019-04-14 ENCOUNTER — Encounter (INDEPENDENT_AMBULATORY_CARE_PROVIDER_SITE_OTHER): Payer: Medicare Other | Admitting: Ophthalmology

## 2019-04-14 DIAGNOSIS — H35033 Hypertensive retinopathy, bilateral: Secondary | ICD-10-CM | POA: Diagnosis not present

## 2019-04-14 DIAGNOSIS — H348112 Central retinal vein occlusion, right eye, stable: Secondary | ICD-10-CM | POA: Diagnosis not present

## 2019-04-14 DIAGNOSIS — I10 Essential (primary) hypertension: Secondary | ICD-10-CM | POA: Diagnosis not present

## 2019-04-14 DIAGNOSIS — H353221 Exudative age-related macular degeneration, left eye, with active choroidal neovascularization: Secondary | ICD-10-CM | POA: Diagnosis not present

## 2019-04-14 DIAGNOSIS — H43813 Vitreous degeneration, bilateral: Secondary | ICD-10-CM

## 2019-05-04 ENCOUNTER — Ambulatory Visit (INDEPENDENT_AMBULATORY_CARE_PROVIDER_SITE_OTHER): Payer: Medicare Other | Admitting: Emergency Medicine

## 2019-05-04 ENCOUNTER — Other Ambulatory Visit: Payer: Self-pay

## 2019-05-04 ENCOUNTER — Encounter: Payer: Self-pay | Admitting: Emergency Medicine

## 2019-05-04 DIAGNOSIS — J449 Chronic obstructive pulmonary disease, unspecified: Secondary | ICD-10-CM | POA: Diagnosis not present

## 2019-05-04 NOTE — Progress Notes (Signed)
  Subjective:    Patient ID: Dylan Knight, male    DOB: 30-Jan-1924, 85 y.o.   MRN: [id number]  HPI  ROV 04/18/18 --85 year old gentleman with a history of COPD, peripheral vascular disease, hypertension, diabetes.  I have followed him for his COPD, frequent exacerbations.  He also has allergic rhinitis and deals with chronic mucus, chronic cough. He has advair and is using reliably. He has duoneb available but does not use very often - his wife believes that he should more frequently.   ROV 11/17/18 --Dylan Knight is 35 has a history of PVD, hypertension, diabetes.  He is followed here for COPD last seen in June.  He has been managed on Advair but he hasn't been taking reliably, uses DuoNeb as needed. He reports using it only rarely - wants a new script as his supply is old.  He is on loratadine and Mucinex for allergic congestion and chest congestion. He reports that he is breathing fairly well. He has had some sneezing, some increased UA noise.   ROV 05/04/2019 --85 year old gentleman with diabetes, hypertension, PVD and COPD with allergic rhinitis which we have been following.  At his last visit 6 months ago I tried switching his Advair to Anoro to see if he would get benefit. He likes it - feels that he is benefiting. Less SOB.  He also uses DuoNeb as needed - very rarely.  He is also on loratadine, Mucinex.      Objective:   Physical Exam  Vitals:   05/04/19 1053  BP: 118/70  Pulse: 82  SpO2: 94%  Weight: 155 lb (70.3 kg)   Gen: Pleasant, elderly man, in no distress,  normal affect  ENT: No lesions,  mouth clear,  oropharynx clear, no postnasal drip  Neck: No JVD, some upper airway mucus that he needs to clear, no overt stridor  Lungs: No use of accessory muscles, distant, no wheeze on a normal respiration but he does wheeze on forced expiration  Cardiovascular: RRR, heart sounds normal, no murmur or gallops, no peripheral edema  Musculoskeletal: No deformities, no cyanosis or  clubbing  Neuro: alert, awake, somewhat poor memory  Skin: Warm, no lesions or rash     Assessment & Plan:  COPD (chronic obstructive pulmonary disease) (HCC) Please continue Anoro once daily as you have been taking it. Keep DuoNeb available to use if you needed for shortness of breath, chest tightness, wheezing. Continue loratadine and Mucinex as you have been taking them. Flu shot in the Fall.  Follow with Dr Lamonte Sakai in 6 months or sooner if you have any problems  Baltazar Apo, MD, PhD 05/04/2019, 11:16 AM [facility] [phone] or if no answer [phone]

## 2019-05-04 NOTE — Patient Instructions (Signed)
Please continue Anoro once daily as you have been taking it. Keep DuoNeb available to use if you needed for shortness of breath, chest tightness, wheezing. Continue loratadine and Mucinex as you have been taking them. Flu shot in the Fall.  Follow with Dr Lamonte Sakai in 6 months or sooner if you have any problems

## 2019-05-04 NOTE — Assessment & Plan Note (Signed)
Please continue Anoro once daily as you have been taking it. Keep DuoNeb available to use if you needed for shortness of breath, chest tightness, wheezing. Continue loratadine and Mucinex as you have been taking them. Flu shot in the Fall.  Follow with Dr Lamonte Sakai in 6 months or sooner if you have any problems

## 2019-05-05 ENCOUNTER — Other Ambulatory Visit: Payer: Self-pay | Admitting: Family Medicine

## 2019-06-08 ENCOUNTER — Other Ambulatory Visit: Payer: Self-pay

## 2019-06-08 NOTE — Patient Outreach (Signed)
[facility]) Care Management  06/08/2019  Dylan Knight [birth year] [id number]   Medication Adherence call to [name] [facility] Identifiers Verify spoke with patient wife patient is past due on Ezetimibe/Simvastatin 10/40 mg patients wife explain he is only taking this medication on Monday ,Tuesday,Friday.patient has plenty at this time and has an appointment at the end of the month for blood work.[name] is showing past due under [place].   [place] Management Direct Dial [phone]  Fax [phone] [email or web address]

## 2019-06-11 ENCOUNTER — Other Ambulatory Visit: Payer: Self-pay | Admitting: Family Medicine

## 2019-06-30 ENCOUNTER — Ambulatory Visit: Payer: Medicare Other

## 2019-07-06 ENCOUNTER — Other Ambulatory Visit: Payer: Self-pay | Admitting: Family Medicine

## 2019-07-06 ENCOUNTER — Other Ambulatory Visit: Payer: Self-pay

## 2019-07-06 ENCOUNTER — Other Ambulatory Visit (INDEPENDENT_AMBULATORY_CARE_PROVIDER_SITE_OTHER): Payer: Medicare Other

## 2019-07-06 DIAGNOSIS — E1159 Type 2 diabetes mellitus with other circulatory complications: Secondary | ICD-10-CM | POA: Diagnosis not present

## 2019-07-06 LAB — LIPID PANEL
Cholesterol: 123 mg/dL (ref 0–200)
HDL: 46.5 mg/dL (ref >39.00)
LDL Cholesterol: 49 mg/dL (ref 0–99)
NonHDL: 76.54
Total CHOL/HDL Ratio: 3
Triglycerides: 140 mg/dL (ref 0.0–149.0)
VLDL: 28 mg/dL (ref 0.0–40.0)

## 2019-07-06 LAB — COMPREHENSIVE METABOLIC PANEL
ALT: 10 U/L (ref 0–53)
AST: 18 U/L (ref 0–37)
Albumin: 4.4 g/dL (ref 3.5–5.2)
Alkaline Phosphatase: 41 U/L (ref 39–117)
BUN: 12 mg/dL (ref 6–23)
CO2: 31 mEq/L (ref 19–32)
Calcium: 9.9 mg/dL (ref 8.4–10.5)
Chloride: 96 mEq/L (ref 96–112)
Creatinine, Ser: 1.25 mg/dL (ref 0.40–1.50)
GFR: 53.84 mL/min — ABNORMAL LOW (ref >60.00)
Glucose, Bld: 135 mg/dL — ABNORMAL HIGH (ref 70–99)
Potassium: 3.8 mEq/L (ref 3.5–5.1)
Sodium: 135 mEq/L (ref 135–145)
Total Bilirubin: 0.8 mg/dL (ref 0.2–1.2)
Total Protein: 6.7 g/dL (ref 6.0–8.3)

## 2019-07-06 LAB — HEMOGLOBIN A1C: Hgb A1c MFr Bld: 6.4 % (ref 4.6–6.5)

## 2019-07-06 LAB — TSH: TSH: 6.23 u[IU]/mL — ABNORMAL HIGH (ref 0.35–4.50)

## 2019-07-07 ENCOUNTER — Encounter: Payer: Medicare Other | Admitting: Family Medicine

## 2019-07-13 ENCOUNTER — Other Ambulatory Visit: Payer: Self-pay

## 2019-07-13 ENCOUNTER — Encounter: Payer: Self-pay | Admitting: Family Medicine

## 2019-07-13 ENCOUNTER — Ambulatory Visit (INDEPENDENT_AMBULATORY_CARE_PROVIDER_SITE_OTHER): Payer: Medicare Other | Admitting: Family Medicine

## 2019-07-13 VITALS — BP 122/60 | HR 76 | Temp 98.2°F | Ht 66.0 in | Wt 157.1 lb

## 2019-07-13 DIAGNOSIS — Z0000 Encounter for general adult medical examination without abnormal findings: Secondary | ICD-10-CM

## 2019-07-13 DIAGNOSIS — E1159 Type 2 diabetes mellitus with other circulatory complications: Secondary | ICD-10-CM

## 2019-07-13 DIAGNOSIS — J449 Chronic obstructive pulmonary disease, unspecified: Secondary | ICD-10-CM | POA: Diagnosis not present

## 2019-07-13 DIAGNOSIS — I10 Essential (primary) hypertension: Secondary | ICD-10-CM

## 2019-07-13 DIAGNOSIS — E785 Hyperlipidemia, unspecified: Secondary | ICD-10-CM | POA: Diagnosis not present

## 2019-07-13 DIAGNOSIS — B359 Dermatophytosis, unspecified: Secondary | ICD-10-CM | POA: Diagnosis not present

## 2019-07-13 DIAGNOSIS — E039 Hypothyroidism, unspecified: Secondary | ICD-10-CM | POA: Diagnosis not present

## 2019-07-13 DIAGNOSIS — Z7189 Other specified counseling: Secondary | ICD-10-CM

## 2019-07-13 MED ORDER — LEVOTHYROXINE SODIUM 25 MCG PO TABS: ORAL_TABLET | ORAL | Status: DC

## 2019-07-13 NOTE — Progress Notes (Signed)
I have personally reviewed the Medicare Annual Wellness questionnaire and have noted 1. The patient's medical and social history 2. Their use of alcohol, tobacco or illicit drugs 3. Their current medications and supplements 4. The patient's functional ability including ADL's, fall risks, home safety risks and hearing or visual             impairment. 5. Diet and physical activities 6. Evidence for depression or mood disorders  The patients weight, height, BMI have been recorded in the chart and visual acuity is per eye clinic.  I have made referrals, counseling and provided education to the patient based review of the above and I have provided the pt with a written personalized care plan for preventive services.  Provider list updated- see scanned forms.  Routine anticipatory guidance given to patient.  See health maintenance. The possibility exists that previously documented standard health maintenance information may have been brought forward from a previous encounter into this note.  If needed, that same information has been updated to reflect the current situation based on today's encounter.    Flu 2020 Shingles up-to-date PNA up-to-date Tetanus 2019 Colon cancer screening not applicable due to age. Prostate cancer screening not applicable due to age Advance directive-wife designated if patient were incapacitated.   Cognitive function addressed- see scanned forms- and if abnormal then additional documentation follows.   Diabetes:  Using medications without difficulties: yes Hypoglycemic episodes:no Hyperglycemic episodes:no Feet problems: no Blood Sugars averaging: ~140s eye exam within last year: yes  COPD.  Some wheeze.  Still has his inhalers but hasn't used recently.  Scant sputum, white, occ, at baseline.  No worse recently.  No covid exposures.  Limited social contact.    He has some chronic irritation and itching in the groin with a little help from nystatin.    Elevated  Cholesterol: Using medications without problems: yes Muscle aches: no Diet compliance: yes Exercise:limited, using walker at baseline.    Hypothyroidism.  Taking 11.5 tabs per week of 60mcg tabs, discussed with patient about inc to 12 tabs in a week.  No thyromegaly.  No masses noted in the neck.  Hypertension:    Using medication without problems or lightheadedness: yes Chest pain with exertion:no Edema: trace BLE edema.  Short of breath: related to COPD but not acutely different.    PMH and SH reviewed  Meds, vitals, and allergies reviewed.   ROS: Per HPI.  Unless specifically indicated otherwise in HPI, the patient denies:  General: fever. Eyes: acute vision changes ENT: sore throat Cardiovascular: chest pain Respiratory: SOB GI: vomiting GU: dysuria Musculoskeletal: acute back pain Derm: acute rash Neuro: acute motor dysfunction Psych: worsening mood Endocrine: polydipsia Heme: bleeding Allergy: hayfever  GEN: nad, alert and oriented HEENT: ncat, TM wnl B NECK: supple w/o LA CV: rrr. PULM: ctab, no inc wob, occ cough noted.   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   No skin breakdown No calluses  Intact DP pulses Normal sensation to light touch and monofilament Nails thickened.   Health Maintenance  Topic Date Due  . OPHTHALMOLOGY EXAM  11/11/2019  . HEMOGLOBIN A1C  01/06/2020  . FOOT EXAM  07/12/2020  . TETANUS/TDAP  04/17/2028  . INFLUENZA VACCINE  Completed  . PNA vac Low Risk Adult  Completed

## 2019-07-13 NOTE — Patient Instructions (Addendum)
Change thyroid medicine.  2 tabs a day except none on Sunday, 12 tabs in 1 week.  Recheck A1c and thyroid test in about 3 months prior to a visit.  Restart your inhalers.  Try using nystatin powder at night and hydrocortisone cream in the AM.  See if that helps more.  Take care.  Glad to see you.

## 2019-07-17 DIAGNOSIS — Z0000 Encounter for general adult medical examination without abnormal findings: Secondary | ICD-10-CM | POA: Insufficient documentation

## 2019-07-17 NOTE — Assessment & Plan Note (Signed)
He can try using nystatin powder at night and hydrocortisone cream in the AM.  He can see if that helps more.  Update me as needed.

## 2019-07-17 NOTE — Assessment & Plan Note (Signed)
No change in meds.  Update me as needed.  Recheck A1c in about 3 months.  Labs discussed with patient.

## 2019-07-17 NOTE — Assessment & Plan Note (Signed)
Change in dose to 2 tabs a day except none on Sunday, 12 tabs in 1 week.  Recheck tsh in about 3 months prior to a visit.

## 2019-07-17 NOTE — Assessment & Plan Note (Signed)
Continue statin.  Labs discussed with patient.  No change in meds.

## 2019-07-17 NOTE — Assessment & Plan Note (Signed)
No increased work of breathing.  Discussed restarting inhalers and updating me as needed.

## 2019-07-17 NOTE — Assessment & Plan Note (Signed)
No change in meds.  Update me as needed.  Labs discussed with patient.

## 2019-07-17 NOTE — Assessment & Plan Note (Signed)
Advance directive- wife designated if patient were incapacitated.  

## 2019-07-17 NOTE — Assessment & Plan Note (Signed)
Flu 2020 Shingles up-to-date PNA up-to-date Tetanus 2019 Colon cancer screening not applicable due to age. Prostate cancer screening not applicable due to age Advance directive-wife designated if patient were incapacitated.   Cognitive function addressed- see scanned forms- and if abnormal then additional documentation follows.

## 2019-09-22 ENCOUNTER — Encounter (INDEPENDENT_AMBULATORY_CARE_PROVIDER_SITE_OTHER): Payer: Medicare Other | Admitting: Ophthalmology

## 2019-09-22 DIAGNOSIS — H353221 Exudative age-related macular degeneration, left eye, with active choroidal neovascularization: Secondary | ICD-10-CM

## 2019-09-22 DIAGNOSIS — I10 Essential (primary) hypertension: Secondary | ICD-10-CM | POA: Diagnosis not present

## 2019-09-22 DIAGNOSIS — H35033 Hypertensive retinopathy, bilateral: Secondary | ICD-10-CM | POA: Diagnosis not present

## 2019-09-22 DIAGNOSIS — H348112 Central retinal vein occlusion, right eye, stable: Secondary | ICD-10-CM

## 2019-09-22 DIAGNOSIS — H43813 Vitreous degeneration, bilateral: Secondary | ICD-10-CM

## 2019-10-10 ENCOUNTER — Other Ambulatory Visit (INDEPENDENT_AMBULATORY_CARE_PROVIDER_SITE_OTHER): Payer: Medicare Other

## 2019-10-10 ENCOUNTER — Other Ambulatory Visit: Payer: Self-pay

## 2019-10-10 DIAGNOSIS — E039 Hypothyroidism, unspecified: Secondary | ICD-10-CM

## 2019-10-10 DIAGNOSIS — E1159 Type 2 diabetes mellitus with other circulatory complications: Secondary | ICD-10-CM | POA: Diagnosis not present

## 2019-10-10 LAB — TSH: TSH: 7.8 u[IU]/mL — ABNORMAL HIGH (ref 0.35–4.50)

## 2019-10-10 LAB — HEMOGLOBIN A1C: Hgb A1c MFr Bld: 6.6 % — ABNORMAL HIGH (ref 4.6–6.5)

## 2019-10-13 ENCOUNTER — Other Ambulatory Visit: Payer: Self-pay

## 2019-10-13 ENCOUNTER — Encounter: Payer: Self-pay | Admitting: Family Medicine

## 2019-10-13 ENCOUNTER — Ambulatory Visit (INDEPENDENT_AMBULATORY_CARE_PROVIDER_SITE_OTHER): Payer: Medicare Other | Admitting: Family Medicine

## 2019-10-13 VITALS — BP 130/76 | HR 71 | Temp 97.6°F | Ht 66.0 in | Wt 157.4 lb

## 2019-10-13 DIAGNOSIS — E1159 Type 2 diabetes mellitus with other circulatory complications: Secondary | ICD-10-CM

## 2019-10-13 DIAGNOSIS — E039 Hypothyroidism, unspecified: Secondary | ICD-10-CM

## 2019-10-13 DIAGNOSIS — G4700 Insomnia, unspecified: Secondary | ICD-10-CM | POA: Diagnosis not present

## 2019-10-13 MED ORDER — ALPRAZOLAM 0.25 MG PO TABS: ORAL_TABLET | ORAL | 0 refills | Status: DC

## 2019-10-13 MED ORDER — LEVOTHYROXINE SODIUM 25 MCG PO TABS: ORAL_TABLET | ORAL | Status: DC

## 2019-10-13 NOTE — Progress Notes (Signed)
This visit occurred during the SARS-CoV-2 public health emergency.  Safety protocols were in place, including screening questions prior to the visit, additional usage of staff PPE, and extensive cleaning of exam room while observing appropriate contact time as indicated for disinfecting solutions.   Diabetes:  Using medications without difficulties: yes Hypoglycemic episodes: no Hyperglycemic episodes: no Feet problems: no Blood Sugars averaging: ~130s usually.   eye exam within last year: yes Discussed goal to avoid hypoglycemia.    Hypothyroidism.  TSH slightly up.  Discussed slight increase in dose.  See orders.  Rare use of xanax at night, when panicked.  Maybe 2x/week use.  No ADE on med.   PMH and SH reviewed  Meds, vitals, and allergies reviewed.   ROS: Per HPI unless specifically indicated in ROS section   GEN: nad, alert and oriented HEENT: mucous membranes moist, ncat NECK: supple w/o LA CV: rrr. PULM: ctab, no inc wob ABD: soft, +bs EXT: trace, almost no, BLE edema SKIN: no acute rash but chronic changes on the L shin at baseline.

## 2019-10-13 NOTE — Patient Instructions (Signed)
2 tabs of thyroid medicine a day.  Use the sleep medicine as needed.  Recheck A1c and TSH in about 3-4 months before a visit if possible.  Take care.  Glad to see you.

## 2019-10-15 DIAGNOSIS — G4700 Insomnia, unspecified: Secondary | ICD-10-CM | POA: Insufficient documentation

## 2019-10-15 NOTE — Assessment & Plan Note (Signed)
Discussed options.  All sleep aids would have fall risk and routine cautions.  At least Xanax should not increase his risk of urinary retention and taking it at a low dose intermittently may help him sleep.  Routine cautions discussed with patient.  Continue as needed use.  Update me as needed.  He agrees with plan.

## 2019-10-15 NOTE — Assessment & Plan Note (Signed)
Increase to 50 mcg daily.  That would be 2 of the 25 mcg tablets.  We can change his prescription later on if his dose is stable and his TSH is normal.  Recheck TSH with next lab visit.

## 2019-10-15 NOTE — Assessment & Plan Note (Addendum)
Discussed goal to avoid hypoglycemia.  A1c controlled.  If he has any low sugars then we can taper his medications.  He agrees with plan.  >25 minutes spent in face to face time with patient, >50% spent in counselling or coordination of care

## 2019-11-29 ENCOUNTER — Encounter: Payer: Self-pay | Admitting: Family Medicine

## 2019-11-29 ENCOUNTER — Ambulatory Visit: Payer: Medicare PPO | Attending: Internal Medicine

## 2019-11-29 DIAGNOSIS — Z23 Encounter for immunization: Secondary | ICD-10-CM | POA: Insufficient documentation

## 2019-11-29 NOTE — Progress Notes (Signed)
   Covid-19 Vaccination Clinic  Name:  Dylan Knight    MRN: [id number] DOB: 17-Jan-1924  11/29/2019  [name] was observed post Covid-19 immunization for 15 minutes without incidence. He was provided with Vaccine Information Sheet and instruction to access the V-Safe system.   [name] was instructed to call 911 with any severe reactions post vaccine: Marland Kitchen Difficulty breathing  . Swelling of your face and throat  . A fast heartbeat  . A bad rash all over your body  . Dizziness and weakness    Immunizations Administered    Name Date Dose VIS Date Route   Pfizer COVID-19 Vaccine 11/29/2019  8:55 AM 0.3 mL 10/20/2019 Intramuscular   Manufacturer: [place]   Lot: [id number]   [place]: [id number]

## 2019-12-06 ENCOUNTER — Other Ambulatory Visit: Payer: Self-pay | Admitting: Family Medicine

## 2019-12-07 NOTE — Telephone Encounter (Signed)
Electronic refill request. Clopidogrel Last office visit:   10/13/2019 Last Filled:    90 tablet 3 01/23/2019  Please advise.

## 2019-12-08 NOTE — Telephone Encounter (Signed)
Sent. Thanks.   

## 2019-12-18 ENCOUNTER — Other Ambulatory Visit: Payer: Self-pay | Admitting: Family Medicine

## 2019-12-18 NOTE — Telephone Encounter (Signed)
Electronic refill request. Alprazolam Last office visit:   10/13/2019 Last Filled:    30 tablet 0 10/13/2019  Please advise.

## 2019-12-19 NOTE — Telephone Encounter (Signed)
Sent. Thanks.   

## 2019-12-20 ENCOUNTER — Ambulatory Visit: Payer: Medicare PPO | Attending: Internal Medicine

## 2019-12-20 DIAGNOSIS — Z23 Encounter for immunization: Secondary | ICD-10-CM | POA: Insufficient documentation

## 2019-12-20 NOTE — Progress Notes (Signed)
   Covid-19 Vaccination Clinic  Name:  Dylan Knight    MRN: [id number] DOB: November 07, 1924  12/20/2019  [name] was observed post Covid-19 immunization for 15 minutes without incidence. He was provided with Vaccine Information Sheet and instruction to access the V-Safe system.   [name] was instructed to call 911 with any severe reactions post vaccine: Marland Kitchen Difficulty breathing  . Swelling of your face and throat  . A fast heartbeat  . A bad rash all over your body  . Dizziness and weakness    Immunizations Administered    Name Date Dose VIS Date Route   Pfizer COVID-19 Vaccine 12/20/2019  8:22 AM 0.3 mL 10/20/2019 Intramuscular   Manufacturer: [place]   Lot: [id number]   [place]: [id number]

## 2020-01-11 ENCOUNTER — Telehealth: Payer: Self-pay

## 2020-01-11 NOTE — Telephone Encounter (Signed)
LVM w COVID screen, front door and back lab info 3.4.2021 TLJ

## 2020-01-15 ENCOUNTER — Other Ambulatory Visit: Payer: Self-pay

## 2020-01-15 ENCOUNTER — Other Ambulatory Visit: Payer: Self-pay | Admitting: Family Medicine

## 2020-01-15 ENCOUNTER — Other Ambulatory Visit (INDEPENDENT_AMBULATORY_CARE_PROVIDER_SITE_OTHER): Payer: Medicare PPO

## 2020-01-15 DIAGNOSIS — E039 Hypothyroidism, unspecified: Secondary | ICD-10-CM

## 2020-01-15 DIAGNOSIS — E1159 Type 2 diabetes mellitus with other circulatory complications: Secondary | ICD-10-CM

## 2020-01-15 LAB — MICROALBUMIN / CREATININE URINE RATIO
Creatinine,U: 52.3 mg/dL
Microalb Creat Ratio: 1.3 mg/g (ref 0.0–30.0)
Microalb, Ur: <0.7 mg/dL (ref 0.0–1.9)

## 2020-01-15 LAB — TSH: TSH: 6.97 u[IU]/mL — ABNORMAL HIGH (ref 0.35–4.50)

## 2020-01-15 LAB — HEMOGLOBIN A1C: Hgb A1c MFr Bld: 6.6 % — ABNORMAL HIGH (ref 4.6–6.5)

## 2020-01-15 NOTE — Addendum Note (Signed)
Addended by: Cloyd Stagers on: 01/15/2020 12:53 PM   Modules accepted: Orders

## 2020-01-19 ENCOUNTER — Encounter: Payer: Self-pay | Admitting: Family Medicine

## 2020-01-19 ENCOUNTER — Ambulatory Visit: Payer: Medicare PPO | Admitting: Family Medicine

## 2020-01-19 ENCOUNTER — Other Ambulatory Visit: Payer: Self-pay

## 2020-01-19 VITALS — BP 126/74 | HR 79 | Temp 96.6°F | Ht 66.0 in | Wt 155.3 lb

## 2020-01-19 DIAGNOSIS — E039 Hypothyroidism, unspecified: Secondary | ICD-10-CM | POA: Diagnosis not present

## 2020-01-19 DIAGNOSIS — E1159 Type 2 diabetes mellitus with other circulatory complications: Secondary | ICD-10-CM

## 2020-01-19 DIAGNOSIS — G4700 Insomnia, unspecified: Secondary | ICD-10-CM

## 2020-01-19 NOTE — Patient Instructions (Signed)
Don't change your meds for now.  Recheck in about 3-4 months with labs at the visit.  Take care.  Glad to see you.  Gently try to "pop" your ears as needed.  Update me as needed.  Take care.  Glad to see you.

## 2020-01-19 NOTE — Progress Notes (Signed)
This visit occurred during the SARS-CoV-2 public health emergency.  Safety protocols were in place, including screening questions prior to the visit, additional usage of staff PPE, and extensive cleaning of exam room while observing appropriate contact time as indicated for disinfecting solutions.  Diabetes:  Using medications without difficulties: yes Hypoglycemic episodes: no Hyperglycemic episodes: no Feet problems:  See exam, rare tingling in the feet.   Blood Sugars averaging: ~130 eye exam within last year: due later this year.  Labs d/w pt.  MALB wnl.   He had his covid vaccine.  Discussed.  He isn't on O2 at baseline.  Recheck pulse ox wnl.  Has had normal pulse ox at home, mid 90s.    Hypothyroidism.  Minimally elevated TSH, improved from prev.  Discussed.  Occ use BZD w/o ADE on med, with relief with use.    Meds, vitals, and allergies reviewed.   ROS: Per HPI unless specifically indicated in ROS section   GEN: nad, alert and oriented HEENT: ncat NECK: supple w/o LA CV: rrr PULM: ctab, no inc wob ABD: soft, +bs EXT: no edema SKIN: no acute rash TM wnl but ETD improved with valsava on exam.    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  No skin breakdown No calluses  Dec DP pulses Normal sensation to light touch and monofilament Nails thickened.   Normal cap refill with some likely chronic venous pooling

## 2020-01-21 NOTE — Assessment & Plan Note (Addendum)
Labs d/w pt.  MALB wnl.  No change in meds.  Continue Janumet.  Recheck periodically.  He agrees.  Foot care discussed with patient.  Update me as needed in the meantime.

## 2020-01-21 NOTE — Assessment & Plan Note (Signed)
Minimally elevated TSH, improved from prev.  Discussed.  Likely not at the point of needing to change his dose at this point.  Continue as is.  We can recheck periodically.  He agrees.

## 2020-01-21 NOTE — Assessment & Plan Note (Signed)
Occ use BZD w/o ADE on med, with relief with use.  He is taking a low dose of alprazolam this seems to help so I would continue as is for now.

## 2020-01-22 ENCOUNTER — Other Ambulatory Visit: Payer: Medicare Other

## 2020-01-25 ENCOUNTER — Ambulatory Visit: Payer: Medicare Other | Admitting: Family Medicine

## 2020-02-12 ENCOUNTER — Other Ambulatory Visit: Payer: Self-pay | Admitting: Family Medicine

## 2020-02-13 NOTE — Telephone Encounter (Signed)
Electronic refill request. Alprazolam Last office visit:   01/19/2020 Last Filled:    30 tablet 0 12/19/2019  Please advise.

## 2020-02-14 NOTE — Telephone Encounter (Signed)
Sent. Thanks.   

## 2020-02-21 DIAGNOSIS — M5416 Radiculopathy, lumbar region: Secondary | ICD-10-CM | POA: Diagnosis not present

## 2020-02-21 DIAGNOSIS — M9903 Segmental and somatic dysfunction of lumbar region: Secondary | ICD-10-CM | POA: Diagnosis not present

## 2020-02-21 DIAGNOSIS — M9901 Segmental and somatic dysfunction of cervical region: Secondary | ICD-10-CM | POA: Diagnosis not present

## 2020-02-21 DIAGNOSIS — M545 Low back pain: Secondary | ICD-10-CM | POA: Diagnosis not present

## 2020-02-22 DIAGNOSIS — M5416 Radiculopathy, lumbar region: Secondary | ICD-10-CM | POA: Diagnosis not present

## 2020-02-22 DIAGNOSIS — M545 Low back pain: Secondary | ICD-10-CM | POA: Diagnosis not present

## 2020-02-22 DIAGNOSIS — M9903 Segmental and somatic dysfunction of lumbar region: Secondary | ICD-10-CM | POA: Diagnosis not present

## 2020-02-22 DIAGNOSIS — M9901 Segmental and somatic dysfunction of cervical region: Secondary | ICD-10-CM | POA: Diagnosis not present

## 2020-02-23 ENCOUNTER — Encounter (INDEPENDENT_AMBULATORY_CARE_PROVIDER_SITE_OTHER): Payer: Medicare Other | Admitting: Ophthalmology

## 2020-02-23 ENCOUNTER — Other Ambulatory Visit: Payer: Self-pay

## 2020-02-23 DIAGNOSIS — H348112 Central retinal vein occlusion, right eye, stable: Secondary | ICD-10-CM | POA: Diagnosis not present

## 2020-02-23 DIAGNOSIS — H353221 Exudative age-related macular degeneration, left eye, with active choroidal neovascularization: Secondary | ICD-10-CM | POA: Diagnosis not present

## 2020-02-23 DIAGNOSIS — H43813 Vitreous degeneration, bilateral: Secondary | ICD-10-CM

## 2020-02-23 DIAGNOSIS — H35033 Hypertensive retinopathy, bilateral: Secondary | ICD-10-CM | POA: Diagnosis not present

## 2020-02-23 DIAGNOSIS — I10 Essential (primary) hypertension: Secondary | ICD-10-CM | POA: Diagnosis not present

## 2020-02-26 DIAGNOSIS — M5416 Radiculopathy, lumbar region: Secondary | ICD-10-CM | POA: Diagnosis not present

## 2020-02-26 DIAGNOSIS — M545 Low back pain: Secondary | ICD-10-CM | POA: Diagnosis not present

## 2020-02-26 DIAGNOSIS — M9901 Segmental and somatic dysfunction of cervical region: Secondary | ICD-10-CM | POA: Diagnosis not present

## 2020-02-26 DIAGNOSIS — M9903 Segmental and somatic dysfunction of lumbar region: Secondary | ICD-10-CM | POA: Diagnosis not present

## 2020-02-28 DIAGNOSIS — M9903 Segmental and somatic dysfunction of lumbar region: Secondary | ICD-10-CM | POA: Diagnosis not present

## 2020-02-28 DIAGNOSIS — M5416 Radiculopathy, lumbar region: Secondary | ICD-10-CM | POA: Diagnosis not present

## 2020-02-28 DIAGNOSIS — M9901 Segmental and somatic dysfunction of cervical region: Secondary | ICD-10-CM | POA: Diagnosis not present

## 2020-02-28 DIAGNOSIS — M545 Low back pain: Secondary | ICD-10-CM | POA: Diagnosis not present

## 2020-02-29 ENCOUNTER — Other Ambulatory Visit: Payer: Self-pay | Admitting: Family Medicine

## 2020-03-02 ENCOUNTER — Other Ambulatory Visit: Payer: Self-pay | Admitting: Family Medicine

## 2020-03-04 DIAGNOSIS — M9901 Segmental and somatic dysfunction of cervical region: Secondary | ICD-10-CM | POA: Diagnosis not present

## 2020-03-04 DIAGNOSIS — M9903 Segmental and somatic dysfunction of lumbar region: Secondary | ICD-10-CM | POA: Diagnosis not present

## 2020-03-04 DIAGNOSIS — M5416 Radiculopathy, lumbar region: Secondary | ICD-10-CM | POA: Diagnosis not present

## 2020-03-04 DIAGNOSIS — M545 Low back pain: Secondary | ICD-10-CM | POA: Diagnosis not present

## 2020-03-06 ENCOUNTER — Other Ambulatory Visit: Payer: Self-pay | Admitting: *Deleted

## 2020-03-06 DIAGNOSIS — M9901 Segmental and somatic dysfunction of cervical region: Secondary | ICD-10-CM | POA: Diagnosis not present

## 2020-03-06 DIAGNOSIS — M545 Low back pain: Secondary | ICD-10-CM | POA: Diagnosis not present

## 2020-03-06 DIAGNOSIS — I779 Disorder of arteries and arterioles, unspecified: Secondary | ICD-10-CM

## 2020-03-06 DIAGNOSIS — M5416 Radiculopathy, lumbar region: Secondary | ICD-10-CM | POA: Diagnosis not present

## 2020-03-06 DIAGNOSIS — M9903 Segmental and somatic dysfunction of lumbar region: Secondary | ICD-10-CM | POA: Diagnosis not present

## 2020-03-06 DIAGNOSIS — I6523 Occlusion and stenosis of bilateral carotid arteries: Secondary | ICD-10-CM

## 2020-03-11 DIAGNOSIS — M545 Low back pain: Secondary | ICD-10-CM | POA: Diagnosis not present

## 2020-03-11 DIAGNOSIS — M9903 Segmental and somatic dysfunction of lumbar region: Secondary | ICD-10-CM | POA: Diagnosis not present

## 2020-03-11 DIAGNOSIS — M9901 Segmental and somatic dysfunction of cervical region: Secondary | ICD-10-CM | POA: Diagnosis not present

## 2020-03-11 DIAGNOSIS — M5416 Radiculopathy, lumbar region: Secondary | ICD-10-CM | POA: Diagnosis not present

## 2020-03-13 ENCOUNTER — Encounter (HOSPITAL_COMMUNITY): Payer: Medicare PPO

## 2020-03-13 ENCOUNTER — Ambulatory Visit: Payer: Medicare PPO | Admitting: Vascular Surgery

## 2020-03-13 DIAGNOSIS — M9903 Segmental and somatic dysfunction of lumbar region: Secondary | ICD-10-CM | POA: Diagnosis not present

## 2020-03-13 DIAGNOSIS — M5416 Radiculopathy, lumbar region: Secondary | ICD-10-CM | POA: Diagnosis not present

## 2020-03-13 DIAGNOSIS — M545 Low back pain: Secondary | ICD-10-CM | POA: Diagnosis not present

## 2020-03-13 DIAGNOSIS — M9901 Segmental and somatic dysfunction of cervical region: Secondary | ICD-10-CM | POA: Diagnosis not present

## 2020-03-18 DIAGNOSIS — M9901 Segmental and somatic dysfunction of cervical region: Secondary | ICD-10-CM | POA: Diagnosis not present

## 2020-03-18 DIAGNOSIS — M5416 Radiculopathy, lumbar region: Secondary | ICD-10-CM | POA: Diagnosis not present

## 2020-03-18 DIAGNOSIS — M9903 Segmental and somatic dysfunction of lumbar region: Secondary | ICD-10-CM | POA: Diagnosis not present

## 2020-03-18 DIAGNOSIS — M545 Low back pain: Secondary | ICD-10-CM | POA: Diagnosis not present

## 2020-03-20 DIAGNOSIS — M9901 Segmental and somatic dysfunction of cervical region: Secondary | ICD-10-CM | POA: Diagnosis not present

## 2020-03-20 DIAGNOSIS — M9903 Segmental and somatic dysfunction of lumbar region: Secondary | ICD-10-CM | POA: Diagnosis not present

## 2020-03-20 DIAGNOSIS — M545 Low back pain: Secondary | ICD-10-CM | POA: Diagnosis not present

## 2020-03-20 DIAGNOSIS — M5416 Radiculopathy, lumbar region: Secondary | ICD-10-CM | POA: Diagnosis not present

## 2020-03-25 DIAGNOSIS — M5416 Radiculopathy, lumbar region: Secondary | ICD-10-CM | POA: Diagnosis not present

## 2020-03-25 DIAGNOSIS — M9903 Segmental and somatic dysfunction of lumbar region: Secondary | ICD-10-CM | POA: Diagnosis not present

## 2020-03-25 DIAGNOSIS — M545 Low back pain: Secondary | ICD-10-CM | POA: Diagnosis not present

## 2020-03-25 DIAGNOSIS — M9901 Segmental and somatic dysfunction of cervical region: Secondary | ICD-10-CM | POA: Diagnosis not present

## 2020-03-27 DIAGNOSIS — M9903 Segmental and somatic dysfunction of lumbar region: Secondary | ICD-10-CM | POA: Diagnosis not present

## 2020-03-27 DIAGNOSIS — M5416 Radiculopathy, lumbar region: Secondary | ICD-10-CM | POA: Diagnosis not present

## 2020-03-27 DIAGNOSIS — M545 Low back pain: Secondary | ICD-10-CM | POA: Diagnosis not present

## 2020-03-27 DIAGNOSIS — M9901 Segmental and somatic dysfunction of cervical region: Secondary | ICD-10-CM | POA: Diagnosis not present

## 2020-03-28 ENCOUNTER — Encounter: Payer: Self-pay | Admitting: Family Medicine

## 2020-03-29 ENCOUNTER — Encounter: Payer: Self-pay | Admitting: Family Medicine

## 2020-03-29 ENCOUNTER — Other Ambulatory Visit: Payer: Self-pay

## 2020-03-29 ENCOUNTER — Ambulatory Visit: Payer: Medicare PPO | Admitting: Family Medicine

## 2020-03-29 DIAGNOSIS — B356 Tinea cruris: Secondary | ICD-10-CM | POA: Diagnosis not present

## 2020-03-29 MED ORDER — FLUCONAZOLE 150 MG PO TABS: 150.0000 mg | ORAL_TABLET | Freq: Once | ORAL | 0 refills | Status: DC

## 2020-03-29 MED ORDER — LEVOTHYROXINE SODIUM 25 MCG PO TABS: 50.0000 ug | ORAL_TABLET | Freq: Every day | ORAL | Status: DC

## 2020-03-29 MED ORDER — CLOTRIMAZOLE-BETAMETHASONE 1-0.05 % EX CREA: 1.0000 "application " | TOPICAL_CREAM | Freq: Two times a day (BID) | CUTANEOUS | 0 refills | Status: DC

## 2020-03-29 NOTE — Patient Instructions (Signed)
Take 1 dose of diflucan/fluconazole.  Repeat in 1 week if needed.   When you take diflucan, skip your vytorin that day and the next day.   If needed, use lotrisone on the irritated area.   If you worsening pain or fever, then you need to get rechecked in the meantime.   Take care.  Glad to see you.

## 2020-03-29 NOTE — Progress Notes (Signed)
This visit occurred during the SARS-CoV-2 public health emergency.  Safety protocols were in place, including screening questions prior to the visit, additional usage of staff PPE, and extensive cleaning of exam room while observing appropriate contact time as indicated for disinfecting solutions.  He has had recurrent skin irritation.  Locally irritated.  No fevers.  Recently noted.  Prev treated with improvement in the past. Last A1c <7.  Sugar 130 recently.    Meds, vitals, and allergies reviewed.   ROS: Per HPI unless specifically indicated in ROS section.   nad ncat Irritation in right groin area consistent with a superficial fungal infection.  Much less irritation in the left groin.

## 2020-04-01 DIAGNOSIS — M9903 Segmental and somatic dysfunction of lumbar region: Secondary | ICD-10-CM | POA: Diagnosis not present

## 2020-04-01 DIAGNOSIS — M9901 Segmental and somatic dysfunction of cervical region: Secondary | ICD-10-CM | POA: Diagnosis not present

## 2020-04-01 DIAGNOSIS — M5416 Radiculopathy, lumbar region: Secondary | ICD-10-CM | POA: Diagnosis not present

## 2020-04-01 DIAGNOSIS — M545 Low back pain: Secondary | ICD-10-CM | POA: Diagnosis not present

## 2020-04-01 NOTE — Assessment & Plan Note (Signed)
Okay for outpatient follow-up.  This does not look like Fournier's gangrene.  Take 1 dose of diflucan/fluconazole today.  Repeat in 1 week if needed.   When taking diflucan, skip vytorin that day and the next day.  Rationale discussed with patient  If needed, use lotrisone on the irritated area.   If you worsening pain or fever, then he will need to get rechecked in the meantime.  He agrees.  Okay for outpatient follow-up.

## 2020-04-03 ENCOUNTER — Other Ambulatory Visit: Payer: Self-pay | Admitting: Family Medicine

## 2020-04-03 DIAGNOSIS — M9903 Segmental and somatic dysfunction of lumbar region: Secondary | ICD-10-CM | POA: Diagnosis not present

## 2020-04-03 DIAGNOSIS — M5416 Radiculopathy, lumbar region: Secondary | ICD-10-CM | POA: Diagnosis not present

## 2020-04-03 DIAGNOSIS — M9901 Segmental and somatic dysfunction of cervical region: Secondary | ICD-10-CM | POA: Diagnosis not present

## 2020-04-03 DIAGNOSIS — M545 Low back pain: Secondary | ICD-10-CM | POA: Diagnosis not present

## 2020-04-09 DIAGNOSIS — M5416 Radiculopathy, lumbar region: Secondary | ICD-10-CM | POA: Diagnosis not present

## 2020-04-09 DIAGNOSIS — M545 Low back pain: Secondary | ICD-10-CM | POA: Diagnosis not present

## 2020-04-09 DIAGNOSIS — M9901 Segmental and somatic dysfunction of cervical region: Secondary | ICD-10-CM | POA: Diagnosis not present

## 2020-04-09 DIAGNOSIS — M9903 Segmental and somatic dysfunction of lumbar region: Secondary | ICD-10-CM | POA: Diagnosis not present

## 2020-04-10 DIAGNOSIS — M545 Low back pain: Secondary | ICD-10-CM | POA: Diagnosis not present

## 2020-04-10 DIAGNOSIS — M5416 Radiculopathy, lumbar region: Secondary | ICD-10-CM | POA: Diagnosis not present

## 2020-04-10 DIAGNOSIS — M9901 Segmental and somatic dysfunction of cervical region: Secondary | ICD-10-CM | POA: Diagnosis not present

## 2020-04-10 DIAGNOSIS — M9903 Segmental and somatic dysfunction of lumbar region: Secondary | ICD-10-CM | POA: Diagnosis not present

## 2020-04-12 ENCOUNTER — Other Ambulatory Visit: Payer: Self-pay | Admitting: Family Medicine

## 2020-04-15 ENCOUNTER — Other Ambulatory Visit: Payer: Self-pay | Admitting: Family Medicine

## 2020-04-15 ENCOUNTER — Encounter: Payer: Self-pay | Admitting: Family Medicine

## 2020-04-15 DIAGNOSIS — M545 Low back pain: Secondary | ICD-10-CM | POA: Diagnosis not present

## 2020-04-15 DIAGNOSIS — M5416 Radiculopathy, lumbar region: Secondary | ICD-10-CM | POA: Diagnosis not present

## 2020-04-15 DIAGNOSIS — M9903 Segmental and somatic dysfunction of lumbar region: Secondary | ICD-10-CM | POA: Diagnosis not present

## 2020-04-15 DIAGNOSIS — M9901 Segmental and somatic dysfunction of cervical region: Secondary | ICD-10-CM | POA: Diagnosis not present

## 2020-04-15 NOTE — Telephone Encounter (Signed)
Electronic refill request. Fluconazole Last office visit:   03/29/2020 Last Filled:   03/29/2020  Electronic refill request. Clotrimazole-betamethasone Last office visit:   03/29/2020 Last Filled:    30 g 0 03/29/2020  Please advise.

## 2020-04-16 NOTE — Telephone Encounter (Signed)
No, it is not an automatic refill.  Patient's wife sent a MyChart note stating that he is having the same problem as before.  She simply stated that she had requested the refill and after I saw the refill come through, I did not forward the MyChart message.  Sorry for the confusion.

## 2020-04-16 NOTE — Telephone Encounter (Signed)
Please get update on patient about his skin condition.  This may have been an automatic refill.  Thanks.

## 2020-04-17 DIAGNOSIS — M9901 Segmental and somatic dysfunction of cervical region: Secondary | ICD-10-CM | POA: Diagnosis not present

## 2020-04-17 DIAGNOSIS — M9903 Segmental and somatic dysfunction of lumbar region: Secondary | ICD-10-CM | POA: Diagnosis not present

## 2020-04-17 DIAGNOSIS — M545 Low back pain: Secondary | ICD-10-CM | POA: Diagnosis not present

## 2020-04-17 DIAGNOSIS — M5416 Radiculopathy, lumbar region: Secondary | ICD-10-CM | POA: Diagnosis not present

## 2020-04-17 NOTE — Telephone Encounter (Signed)
Noted.  Sent. Thanks.   If not better with this then please let me know.

## 2020-04-22 DIAGNOSIS — M5416 Radiculopathy, lumbar region: Secondary | ICD-10-CM | POA: Diagnosis not present

## 2020-04-22 DIAGNOSIS — M9903 Segmental and somatic dysfunction of lumbar region: Secondary | ICD-10-CM | POA: Diagnosis not present

## 2020-04-22 DIAGNOSIS — M9901 Segmental and somatic dysfunction of cervical region: Secondary | ICD-10-CM | POA: Diagnosis not present

## 2020-04-22 DIAGNOSIS — M545 Low back pain: Secondary | ICD-10-CM | POA: Diagnosis not present

## 2020-04-23 ENCOUNTER — Other Ambulatory Visit: Payer: Self-pay

## 2020-04-23 ENCOUNTER — Ambulatory Visit (INDEPENDENT_AMBULATORY_CARE_PROVIDER_SITE_OTHER): Payer: Medicare PPO | Admitting: Family Medicine

## 2020-04-23 ENCOUNTER — Encounter: Payer: Self-pay | Admitting: Family Medicine

## 2020-04-23 VITALS — BP 140/80 | HR 84 | Temp 96.0°F | Ht 66.0 in | Wt 151.4 lb

## 2020-04-23 DIAGNOSIS — E119 Type 2 diabetes mellitus without complications: Secondary | ICD-10-CM | POA: Diagnosis not present

## 2020-04-23 DIAGNOSIS — H6980 Other specified disorders of Eustachian tube, unspecified ear: Secondary | ICD-10-CM | POA: Diagnosis not present

## 2020-04-23 DIAGNOSIS — E1159 Type 2 diabetes mellitus with other circulatory complications: Secondary | ICD-10-CM

## 2020-04-23 DIAGNOSIS — B356 Tinea cruris: Secondary | ICD-10-CM

## 2020-04-23 LAB — POCT GLYCOSYLATED HEMOGLOBIN (HGB A1C): Hemoglobin A1C: 6.2 % — AB (ref 4.0–5.6)

## 2020-04-23 NOTE — Progress Notes (Signed)
This visit occurred during the SARS-CoV-2 public health emergency.  Safety protocols were in place, including screening questions prior to the visit, additional usage of staff PPE, and extensive cleaning of exam room while observing appropriate contact time as indicated for disinfecting solutions.  Diabetes:  Using medications without difficulties: yes Hypoglycemic episodes: no Hyperglycemic episodes: no Feet problems: rare tingling in the feet, he has vascular f/u pending.   Blood Sugars averaging: checked occ at home, usually ~130 eye exam within last year: has fu pending for later this year.   A1c 6.2.   Rash persists in the bilateral inguinal area.  S/p tx with diflucan, most recently yesterday.  No fevers.  He thought he was improving but not resolved with lotrisone cream.    His wife is still on O2 at baseline, d/w pt.    He is seeing chiropractor for his back with some relief.    He is noted ear fullness without fever.  Meds, vitals, and allergies reviewed.   ROS: Per HPI unless specifically indicated in ROS section   GEN: nad, alert and oriented HEENT: ncat, tympanic membranes without erythema.  No tympanic membrane movement on Valsalva. NECK: supple w/o LA CV: rrr. PULM: ctab, no inc wob ABD: soft, +bs SKIN: B inguinal rash improved but not resolved.

## 2020-04-23 NOTE — Patient Instructions (Signed)
If you have any trouble with low sugars then let me know.   Keep using the cream for now and repeat the diflucan in 1 week.  Update me as needed.  Recheck labs at a visit in about 3 months at a visit like today.  Take care.  Glad to see you.

## 2020-04-24 ENCOUNTER — Ambulatory Visit (HOSPITAL_COMMUNITY)
Admission: RE | Admit: 2020-04-24 | Discharge: 2020-04-24 | Disposition: A | Discharge status: Home / Self Care | Payer: Medicare PPO | Source: Ambulatory Visit | Attending: Vascular Surgery | Admitting: Vascular Surgery

## 2020-04-24 ENCOUNTER — Other Ambulatory Visit: Payer: Self-pay

## 2020-04-24 ENCOUNTER — Encounter: Payer: Self-pay | Admitting: Vascular Surgery

## 2020-04-24 ENCOUNTER — Ambulatory Visit (INDEPENDENT_AMBULATORY_CARE_PROVIDER_SITE_OTHER): Payer: Medicare PPO | Admitting: Vascular Surgery

## 2020-04-24 ENCOUNTER — Ambulatory Visit (INDEPENDENT_AMBULATORY_CARE_PROVIDER_SITE_OTHER)
Admission: RE | Admit: 2020-04-24 | Discharge: 2020-04-24 | Disposition: A | Discharge status: Home / Self Care | Payer: Medicare PPO | Source: Ambulatory Visit | Attending: Vascular Surgery | Admitting: Vascular Surgery

## 2020-04-24 VITALS — BP 128/79 | HR 66 | Temp 97.6°F | Resp 20 | Ht 66.0 in | Wt 150.0 lb

## 2020-04-24 DIAGNOSIS — I779 Disorder of arteries and arterioles, unspecified: Secondary | ICD-10-CM

## 2020-04-24 DIAGNOSIS — I6523 Occlusion and stenosis of bilateral carotid arteries: Secondary | ICD-10-CM | POA: Diagnosis not present

## 2020-04-24 DIAGNOSIS — H6980 Other specified disorders of Eustachian tube, unspecified ear: Secondary | ICD-10-CM | POA: Insufficient documentation

## 2020-04-24 NOTE — Assessment & Plan Note (Signed)
No change in meds.  Recheck periodically.  Update me as needed.  Hypoglycemia cautions discussed with patient.

## 2020-04-24 NOTE — Progress Notes (Signed)
REASON FOR VISIT:   Follow-up of peripheral vascular disease and carotid artery disease.  MEDICAL ISSUES:   BILATERAL CAROTID DISEASE: This patient underwent a right carotid endarterectomy in 2007.  There is no evidence of recurrent carotid stenosis on the right.  He subsequently had a left carotid stent which is also patent.  He is asymptomatic.  He is on Plavix.  I have ordered a follow-up carotid duplex scan in 1 year and I will see him back at that time.  He knows to call sooner if he has problems.  PERIPHERAL VASCULAR DISEASE: This patient underwent abdominal aortic aneurysm repair in 2003.  He has palpable femoral pulses and his graft is patent.  He also has a right femoropopliteal bypass graft.  On the right side he has an ABI 100% with a biphasic dorsalis pedis signal.  On the left side ABI 62% but he does have a biphasic dorsalis pedis signal on the left also.  Toe pressures 73 on the left which is certainly adequate.  He is not a smoker.  I encouraged him to stay as active as possible.  I think he is in excellent shape for his age.  I ordered follow-up ABIs in 1 year and I will see him back at that time.  He knows to call sooner if he has problems.   HPI:   Dylan Knight is a pleasant 86 y.o. male who I last saw on 07/06/2018 for follow-up of his carotid disease and peripheral vascular disease.  He had a previous right carotid endarterectomy in 2007.  He subsequently had a left carotid stent.  I also repaired his abdominal aortic aneurysm in 2003.  In addition in 2005 he had a right femoropopliteal bypass.  He comes in for a 1 year follow-up visit.  Past Medical History:  Diagnosis Date  . Arthritis    left hip  . BPH (benign prostatic hyperplasia)   . Cancer [facility])    Skin cancers removed  . Carotid artery occlusion   . COPD (chronic obstructive pulmonary disease) ([place])   . Diabetes mellitus without complication ([place])   . Hyperlipidemia   . Hypertension   . Hypothyroidism    . Macular degeneration   . Peripheral arterial disease ([place]) 2002  . Stroke [facility]) 2005    Family History  Problem Relation Age of Onset  . Stroke Mother   . Hypertension Mother   . Heart disease Father   . Heart attack Father   . Cancer Brother   . Hypertension Brother     SOCIAL HISTORY: Social History   Tobacco Use  . Smoking status: Former Smoker    Packs/day: 1.00    Years: 63.00    Pack years: 63.00    Types: Cigarettes    Quit date: 04/09/2004    Years since quitting: 16.0  . Smokeless tobacco: Former Systems developer  . Tobacco comment: Quit smoking in 2005  Substance Use Topics  . Alcohol use: No    Alcohol/week: 0.0 standard drinks    Allergies  Allergen Reactions  . Iohexol Hives and Other (See Comments)    Went away with benadryl   . Iodine Rash    Current Outpatient Medications  Medication Sig Dispense Refill  . acetaminophen (TYLENOL) 325 MG tablet Take 650 mg by mouth every 6 (six) hours as needed for mild pain.     Marland Kitchen albuterol (PROVENTIL HFA;VENTOLIN HFA) 108 (90 BASE) MCG/ACT inhaler Inhale 2 puffs into the lungs every 6 (  six) hours as needed for wheezing or shortness of breath. 1 Inhaler 6  . ALPRAZolam (XANAX) 0.25 MG tablet TAKE 0.5 TABLETS (0.125 MG TOTAL) BY MOUTH AT BEDTIME AS NEEDED FOR ANXIETY OR SLEEP. 15 tablet 1  . atenolol-chlorthalidone (TENORETIC) 50-25 MG tablet TAKE 1/2 TABLET BY MOUTH EVERY DAY 45 tablet 2  . benzonatate (TESSALON) 200 MG capsule TAKE 1 CAPSULE BY MOUTH THREE TIMES A DAY AS NEEDED 30 capsule 3  . blood glucose meter kit and supplies Dispense based on patient and insurance preference. Use to check blood sugar daily as directed. (FOR ICD-10 E10.9, E11.9). 1 each 0  . clopidogrel (PLAVIX) 75 MG tablet TAKE 1 TABLET (75MG) BY MOUTH EVERY DAY 90 tablet 3  . clotrimazole-betamethasone (LOTRISONE) cream APPLY TO AFFECTED AREA TWICE A DAY 30 g 0  . ezetimibe-simvastatin (VYTORIN) 10-40 MG tablet TAKE 1 TABLET BY MOUTH EVERY DAY 90  tablet 0  . fluconazole (DIFLUCAN) 150 MG tablet     . glucose blood (ACCU-CHEK GUIDE) test strip Use as instructed to check blood sugar daily as directed.  Diagnosis E10.9  Non insulin dependent. 100 each 3  . guaiFENesin (MUCINEX) 600 MG 12 hr tablet Take 600 mg by mouth 2 (two) times daily.     Marland Kitchen ipratropium-albuterol (DUONEB) 0.5-2.5 (3) MG/3ML SOLN Take 3 mLs by nebulization 3 (three) times daily. As directed 180 mL 2  . JANUMET 50-500 MG tablet TAKE 1 TABLET BY MOUTH EVERY DAY 90 tablet 2  . levothyroxine (SYNTHROID) 25 MCG tablet Take 2 tablets (50 mcg total) by mouth daily before breakfast.    . loratadine (CLARITIN) 10 MG tablet Take 10 mg by mouth daily. Reported on 01/03/2016    . Multiple Vitamins-Minerals (CENTRUM SILVER PO) Take 1 tablet by mouth daily.    . Multiple Vitamins-Minerals (OCUVITE PRESERVISION PO) Take 1 tablet by mouth 2 (two) times daily.    Marland Kitchen nystatin (MYCOSTATIN/NYSTOP) powder Apply topically 3 (three) times daily. 30 g 1  . tamsulosin (FLOMAX) 0.4 MG CAPS capsule TAKE 2 CAPSULES BY MOUTH EVERY DAY 180 capsule 0  . umeclidinium-vilanterol (ANORO ELLIPTA) 62.5-25 MCG/INH AEPB Inhale 1 puff into the lungs daily. 14 each 0   No current facility-administered medications for this visit.    REVIEW OF SYSTEMS:  _0  denotes positive finding, _1  denotes negative finding Cardiac  Comments:  Chest pain or chest pressure:    Shortness of breath upon exertion: x   Short of breath when lying flat:    Irregular heart rhythm:        Vascular    Pain in calf, thigh, or hip brought on by ambulation:    Pain in feet at night that wakes you up from your sleep:     Blood clot in your veins:    Leg swelling:         Pulmonary    Oxygen at home:    Productive cough:     Wheezing:         Neurologic    Sudden weakness in arms or legs:     Sudden numbness in arms or legs:     Sudden onset of difficulty speaking or slurred speech:    Temporary loss of vision in one eye:      Problems with dizziness:         Gastrointestinal    Blood in stool:     Vomited blood:         Genitourinary    Burning  when urinating:     Blood in urine:        Psychiatric    Major depression:         Hematologic    Bleeding problems:    Problems with blood clotting too easily:        Skin    Rashes or ulcers:        Constitutional    Fever or chills:     PHYSICAL EXAM:   Vitals:   04/24/20 1118 04/24/20 1120  BP: 128/70 128/79  Pulse: 66   Resp: 20   Temp: 97.6 F (36.4 C)   TempSrc: Temporal   SpO2: 93%   Weight: 150 lb (68 kg)   Height: _0  (1.676 m)     GENERAL: The patient is a well-nourished male, in no acute distress. The vital signs are documented above. CARDIAC: There is a regular rate and rhythm.  VASCULAR: I do not detect carotid bruits. He has palpable femoral pulses. I cannot palpate pedal pulses. PULMONARY: There is good air exchange bilaterally without wheezing or rales. ABDOMEN: Soft and non-tender with normal pitched bowel sounds.  MUSCULOSKELETAL: He has had a previous right fifth toe amputation which is healed. NEUROLOGIC: No focal weakness or paresthesias are detected. SKIN: There are no ulcers or rashes noted. PSYCHIATRIC: The patient has a normal affect.  DATA:    CAROTID DUPLEX: I have independently interpreted his carotid duplex scan today.  On the right side there is a less than 39% stenosis.  The right vertebral artery is patent with antegrade flow.  On the left side the left carotid stent is patent.  There is no evidence of restenosis.  The left vertebral artery is occluded for angio.  ARTERIAL DOPPLER STUDY: I have independently interpreted his arterial Doppler study today.  On the right side there is a monophasic posterior tibial signal with a biphasic dorsalis pedis signal.  ABI is 100%.  Toe pressure 72 mmHg.  On the left side there is a monophasic posterior tibial signal with a biphasic dorsalis pedis signal.  ABI  62%.  Toe pressure 73 mmHg.  Deitra Mayo [facility] [phone]

## 2020-04-24 NOTE — Assessment & Plan Note (Signed)
Improved but not resolved.  Can retreat with Diflucan in future if needed.  Continue Lotrisone cream for now.  Update me as needed.  Fortunately his sugar is reasonable.

## 2020-04-24 NOTE — Assessment & Plan Note (Signed)
He can try Flonase and gently perform Valsalva and then update me as needed.

## 2020-04-25 DIAGNOSIS — M545 Low back pain: Secondary | ICD-10-CM | POA: Diagnosis not present

## 2020-04-25 DIAGNOSIS — M5416 Radiculopathy, lumbar region: Secondary | ICD-10-CM | POA: Diagnosis not present

## 2020-04-25 DIAGNOSIS — M9903 Segmental and somatic dysfunction of lumbar region: Secondary | ICD-10-CM | POA: Diagnosis not present

## 2020-04-25 DIAGNOSIS — M9901 Segmental and somatic dysfunction of cervical region: Secondary | ICD-10-CM | POA: Diagnosis not present

## 2020-05-01 DIAGNOSIS — M5416 Radiculopathy, lumbar region: Secondary | ICD-10-CM | POA: Diagnosis not present

## 2020-05-01 DIAGNOSIS — M9903 Segmental and somatic dysfunction of lumbar region: Secondary | ICD-10-CM | POA: Diagnosis not present

## 2020-05-01 DIAGNOSIS — M9901 Segmental and somatic dysfunction of cervical region: Secondary | ICD-10-CM | POA: Diagnosis not present

## 2020-05-01 DIAGNOSIS — M545 Low back pain: Secondary | ICD-10-CM | POA: Diagnosis not present

## 2020-05-06 DIAGNOSIS — M545 Low back pain: Secondary | ICD-10-CM | POA: Diagnosis not present

## 2020-05-06 DIAGNOSIS — M9903 Segmental and somatic dysfunction of lumbar region: Secondary | ICD-10-CM | POA: Diagnosis not present

## 2020-05-06 DIAGNOSIS — M5416 Radiculopathy, lumbar region: Secondary | ICD-10-CM | POA: Diagnosis not present

## 2020-05-06 DIAGNOSIS — M9901 Segmental and somatic dysfunction of cervical region: Secondary | ICD-10-CM | POA: Diagnosis not present

## 2020-05-08 DIAGNOSIS — M9901 Segmental and somatic dysfunction of cervical region: Secondary | ICD-10-CM | POA: Diagnosis not present

## 2020-05-08 DIAGNOSIS — M9903 Segmental and somatic dysfunction of lumbar region: Secondary | ICD-10-CM | POA: Diagnosis not present

## 2020-05-08 DIAGNOSIS — M5416 Radiculopathy, lumbar region: Secondary | ICD-10-CM | POA: Diagnosis not present

## 2020-05-08 DIAGNOSIS — M545 Low back pain: Secondary | ICD-10-CM | POA: Diagnosis not present

## 2020-05-14 DIAGNOSIS — M9903 Segmental and somatic dysfunction of lumbar region: Secondary | ICD-10-CM | POA: Diagnosis not present

## 2020-05-14 DIAGNOSIS — M545 Low back pain: Secondary | ICD-10-CM | POA: Diagnosis not present

## 2020-05-14 DIAGNOSIS — M9901 Segmental and somatic dysfunction of cervical region: Secondary | ICD-10-CM | POA: Diagnosis not present

## 2020-05-14 DIAGNOSIS — M5416 Radiculopathy, lumbar region: Secondary | ICD-10-CM | POA: Diagnosis not present

## 2020-05-16 ENCOUNTER — Other Ambulatory Visit: Payer: Self-pay | Admitting: Family Medicine

## 2020-05-17 NOTE — Telephone Encounter (Signed)
Refill request Vytorin Last refill 04/03/20 #90 Last office visit 04/23/20 See drug warning with Fluconozole

## 2020-05-19 NOTE — Telephone Encounter (Signed)
He is done with Diflucan so it should not be an issue right now but he is aware to skip his statin if he has to use Diflucan.  Thanks.  Rx sent.

## 2020-05-20 ENCOUNTER — Other Ambulatory Visit: Payer: Self-pay | Admitting: Family Medicine

## 2020-05-20 NOTE — Telephone Encounter (Signed)
Refill request  Lotrisone Last office visit 04/23/20 Last refill 04/17/20   30G

## 2020-05-21 ENCOUNTER — Encounter: Payer: Self-pay | Admitting: Family Medicine

## 2020-05-21 NOTE — Telephone Encounter (Signed)
Prev sent.  Thanks.  

## 2020-05-23 ENCOUNTER — Other Ambulatory Visit: Payer: Self-pay

## 2020-05-23 ENCOUNTER — Encounter: Payer: Self-pay | Admitting: Family Medicine

## 2020-05-23 ENCOUNTER — Ambulatory Visit: Payer: Medicare PPO | Admitting: Family Medicine

## 2020-05-23 ENCOUNTER — Telehealth: Payer: Self-pay | Admitting: Family Medicine

## 2020-05-23 VITALS — BP 110/58 | HR 89 | Temp 97.1°F | Ht 66.0 in | Wt 149.5 lb

## 2020-05-23 DIAGNOSIS — R609 Edema, unspecified: Secondary | ICD-10-CM

## 2020-05-23 DIAGNOSIS — B356 Tinea cruris: Secondary | ICD-10-CM | POA: Diagnosis not present

## 2020-05-23 MED ORDER — FUROSEMIDE 20 MG PO TABS
20.0000 mg | ORAL_TABLET | Freq: Every day | ORAL | 3 refills | Status: DC | PRN
Start: 2020-05-23 — End: 2020-07-24

## 2020-05-23 MED ORDER — ATENOLOL-CHLORTHALIDONE 50-25 MG PO TABS: ORAL_TABLET | ORAL | Status: DC

## 2020-05-23 NOTE — Telephone Encounter (Signed)
Noted. Thanks.

## 2020-05-23 NOTE — Telephone Encounter (Signed)
Please triage patient about his foot swelling.  See about his breathing, see if his weight is increased, and see if he has any other new symptoms such as chest pain.  Please let me know.  Thanks.

## 2020-05-23 NOTE — Patient Instructions (Signed)
Go to the lab on the way out.   If you have mychart we'll likely use that to update you.    Stop atentolol/chlorthalidone for now.  Start furosemide 20mg  in the AM.   Drink enough water to keep your urine clear.   Try to elevate your feet when possible.  Update me about your swelling in a few days, sooner if needed.  Take care.  Glad to see you.

## 2020-05-23 NOTE — Telephone Encounter (Addendum)
I spoke with pt's wife. pt has swelling in both feet. Rt lower leg is swollen but no redness or pain. No pain or redness. Swelling does not go down overnight when pt is sleeping.pt cannot wear his shoes due to swelling. No CP or SOB. Lt lower leg is swollen and red (no pain) but that has been going on for several wks and Dr Damita Dunnings did see his leg swollen at last visit in June. Pt got good report from Dr Doren Custard 04/24/20.  Pt does not weigh routinely but pts wife said pt does not look like he is gaining wt. Asked if could weigh pt each morning and record wts and Mrs Arabie said yes. Pt is not on any diuretic at this time.scheduled 30' appt with Dr Damita Dunnings today at 4 PM. UC & ED precautions given and pts wife voiced understanding.

## 2020-05-23 NOTE — Progress Notes (Signed)
This visit occurred during the SARS-CoV-2 public health emergency.  Safety protocols were in place, including screening questions prior to the visit, additional usage of staff PPE, and extensive cleaning of exam room while observing appropriate contact time as indicated for disinfecting solutions.  His wife was in the hospital then at SNF, she is recently home.  Discussed.  He has had groin rash, treated with topical antifungal.  Locally irritated.  This was initially getting better but he ran out of topical cream.  He has restarted in the meantime with some improvement.  No fevers.  Lower extremity swelling.  Recheck pulse ox 91%.  No extra salt.  More BLE edema in the last few days.  Weight is stable.  No fevers.  Using a walker at baseline.  No chest pain.  GEN: nad, alert and oriented HEENT: ncat NECK: supple w/o LA CV: rrr.  PULM: ctab, no inc wob ABD: soft, +bs EXT: 1+ BLE (L>R) edema SKIN: He has bilateral tinea changes in the inguinal area but no ulceration.

## 2020-05-24 LAB — BASIC METABOLIC PANEL
BUN: 21 mg/dL (ref 6–23)
CO2: 29 mEq/L (ref 19–32)
Calcium: 10.3 mg/dL (ref 8.4–10.5)
Chloride: 92 mEq/L — ABNORMAL LOW (ref 96–112)
Creatinine, Ser: 1.27 mg/dL (ref 0.40–1.50)
GFR: 52.76 mL/min — ABNORMAL LOW (ref >60.00)
Glucose, Bld: 134 mg/dL — ABNORMAL HIGH (ref 70–99)
Potassium: 4.1 mEq/L (ref 3.5–5.1)
Sodium: 130 mEq/L — ABNORMAL LOW (ref 135–145)

## 2020-05-26 DIAGNOSIS — R609 Edema, unspecified: Secondary | ICD-10-CM | POA: Insufficient documentation

## 2020-05-26 NOTE — Assessment & Plan Note (Signed)
Continue topical clotrimazole and update me as needed.

## 2020-05-26 NOTE — Assessment & Plan Note (Signed)
Discussed options.  I do not want to induce hypotension with starting Lasix.  Therefore stop atentolol/chlorthalidone for now.  Start furosemide 20mg  in the AM.   Drink enough water to keep urine clear.   Try to elevate feet when possible.  He can update me about his swelling in a few days, sooner if needed.  See notes on labs.

## 2020-05-27 ENCOUNTER — Encounter: Payer: Self-pay | Admitting: Family Medicine

## 2020-06-03 ENCOUNTER — Encounter: Payer: Self-pay | Admitting: Family Medicine

## 2020-06-03 ENCOUNTER — Other Ambulatory Visit: Payer: Self-pay | Admitting: Family Medicine

## 2020-06-03 NOTE — Telephone Encounter (Signed)
Electronic refill request. Lotrisone Cream Last office visit:   05/23/2020 Last Filled:    30 g 0 05/20/2020  Please advise.

## 2020-06-04 MED ORDER — FLUCONAZOLE 150 MG PO TABS: 150.0000 mg | ORAL_TABLET | Freq: Once | ORAL | 0 refills | Status: AC

## 2020-06-04 NOTE — Telephone Encounter (Signed)
Sent.  See mychart message.  Thanks.

## 2020-06-14 ENCOUNTER — Encounter: Payer: Self-pay | Admitting: Family Medicine

## 2020-07-07 ENCOUNTER — Encounter: Payer: Self-pay | Admitting: Family Medicine

## 2020-07-08 ENCOUNTER — Ambulatory Visit
Admission: EM | Admit: 2020-07-08 | Discharge: 2020-07-08 | Disposition: A | Discharge status: Home / Self Care | Payer: Medicare PPO | Attending: Emergency Medicine | Admitting: Emergency Medicine

## 2020-07-08 ENCOUNTER — Telehealth: Payer: Self-pay | Admitting: Family Medicine

## 2020-07-08 ENCOUNTER — Ambulatory Visit (INDEPENDENT_AMBULATORY_CARE_PROVIDER_SITE_OTHER): Payer: Medicare PPO

## 2020-07-08 DIAGNOSIS — R042 Hemoptysis: Secondary | ICD-10-CM | POA: Diagnosis not present

## 2020-07-08 DIAGNOSIS — J189 Pneumonia, unspecified organism: Secondary | ICD-10-CM | POA: Diagnosis not present

## 2020-07-08 DIAGNOSIS — Z1152 Encounter for screening for COVID-19: Secondary | ICD-10-CM | POA: Diagnosis not present

## 2020-07-08 DIAGNOSIS — R05 Cough: Secondary | ICD-10-CM | POA: Diagnosis not present

## 2020-07-08 DIAGNOSIS — J180 Bronchopneumonia, unspecified organism: Secondary | ICD-10-CM | POA: Diagnosis not present

## 2020-07-08 MED ORDER — AMOXICILLIN-POT CLAVULANATE 875-125 MG PO TABS
1.0000 | ORAL_TABLET | Freq: Two times a day (BID) | ORAL | 0 refills | Status: DC
Start: 2020-07-08 — End: 2020-07-19

## 2020-07-08 MED ORDER — AZITHROMYCIN 250 MG PO TABS
250.0000 mg | ORAL_TABLET | Freq: Every day | ORAL | 0 refills | Status: DC
Start: 2020-07-08 — End: 2020-07-19

## 2020-07-08 MED ORDER — BENZONATATE 200 MG PO CAPS
200.0000 mg | ORAL_CAPSULE | Freq: Three times a day (TID) | ORAL | 0 refills | Status: AC | PRN
Start: 2020-07-08 — End: 2020-07-15

## 2020-07-08 MED ORDER — ALBUTEROL SULFATE HFA 108 (90 BASE) MCG/ACT IN AERS: 2.0000 | INHALATION_SPRAY | Freq: Four times a day (QID) | RESPIRATORY_TRACT | 0 refills | Status: DC | PRN

## 2020-07-08 NOTE — Telephone Encounter (Signed)
See following phone note re: UC.

## 2020-07-08 NOTE — ED Triage Notes (Signed)
Pt states coughed up blood x2 last night and once today. States his PCP sent him here for a chest x-ray. States has had increase SOB over the past week, hx of COPD.

## 2020-07-08 NOTE — Telephone Encounter (Signed)
Thanks. Noted.  Will await UC notes.

## 2020-07-08 NOTE — Telephone Encounter (Signed)
Called and spoke to patient's wife and was advised that her husband has been coughing up some sputum that has bloody streaks in it. Patient's wife stated that this happened twice last night and a couple of times this morning. Patient's wife stated that the blood is a bright red. Patient's wife denies SOB other than what he normally has with his COPD  Patient's wife denies fever, diarrhea, or difficulty breathing . Mrs. Bhagat stated that his feet and legs are still swollen. After speaking with Dr. Damita Dunnings patient's wife was advised that her husband needs a face to face evaluation and may need a chest xray. Patient's wife was given information on the [facility] at [facility]. Patient's wife stated that they are dressed and ready to go and will head to the UC now.

## 2020-07-08 NOTE — Telephone Encounter (Signed)
See mychart message re: cough and bloody sputum.  Please triage patient.  Thanks.

## 2020-07-08 NOTE — Discharge Instructions (Addendum)
Chest x-ray concerning for pneumonia Begin Augmentin twice daily for 1 week Azithromycin-2 tablets today, 1 tablet for the following 4 days Tessalon/benzonatate every 8 hours as needed for cough Albuterol inhaler as needed for shortness of breath, chest tightness If any symptoms not improving or worsening despite adding in above antibiotics please follow-up in the emergency room Follow-up for repeat chest x-ray in 4 to 6 weeks to ensure resolution

## 2020-07-08 NOTE — ED Provider Notes (Signed)
[name]    CSN: [id number] Arrival date & time: 07/08/20  1339      History   Chief Complaint Chief Complaint  Patient presents with  . coughed up blood    HPI Dylan Knight is a 86 y.o. male history of hypertension, hyperlipidemia, DM type II, COPD, A. fib evaluation of hemoptysis.  Patient reports over the past 24 hours he has had 2 episodes of bloody sputum.  Overall has cough has remained his normal cough and has been productive.  His wife reports that he has seemed slightly more shortness of breath over the past week than his baseline.  Reports 63 years of smoking.  Denies fevers.  Fatigue and energy level have been normal.  Denies any vomiting.  HPI  Past Medical History:  Diagnosis Date  . Arthritis    left hip  . BPH (benign prostatic hyperplasia)   . Cancer [facility])    Skin cancers removed  . Carotid artery occlusion   . COPD (chronic obstructive pulmonary disease) ([place])   . Diabetes mellitus without complication ([place])   . Hyperlipidemia   . Hypertension   . Hypothyroidism   . Macular degeneration   . Peripheral arterial disease ([place]) 2002  . Stroke [facility]) 2005    Patient Active Problem List   Diagnosis Date Noted  . Edema 05/26/2020  . ETD (eustachian tube dysfunction) 04/24/2020  . Insomnia 10/15/2019  . Medicare annual wellness visit, subsequent 07/17/2019  . Tinea cruris 02/12/2019  . Urinary retention 02/10/2019  . Dysuria 02/07/2019  . Restlessness 01/23/2019  . At risk for falls 07/21/2018  . Laceration of left hand 11/13/2017  . Gait abnormality 05/19/2017  . Hyperlipemia 03/29/2017  . HTN (hypertension) 03/29/2017  . Advance care planning 02/25/2017  . Macular degeneration 02/25/2017  . Transient speech disturbance   . Bilateral carotid artery stenosis   . History of stroke   . TIA (transient ischemic attack) 01/30/2017  . Acute ischemic stroke ([place]) 01/30/2017  . Low back pain with right-sided sciatica 12/01/2016  .  Diabetes mellitus type 2, controlled ([place]) 12/01/2016  . CAD (coronary artery disease) 12/01/2016  . Hypothyroidism 12/01/2016  . Back pain 11/02/2016  . Aftercare following surgery of the circulatory system, [place] 06/28/2013  . Cough 04/18/2013  . COPD (chronic obstructive pulmonary disease) ([place]) 11/22/2012  . Occlusion and stenosis of carotid artery without mention of cerebral infarction 06/29/2012  . Peripheral vascular disease ([place]) 12/30/2011    Past Surgical History:  Procedure Laterality Date  . ABDOMINAL AORTIC ANEURYSM REPAIR  2003   Aorto-left femoral-right iliac BPG by Dr. Scot Dock  . Aortic aneursym surgery     2003- Dr. Scot Dock  . BACK SURGERY     1960  . CAROTID ENDARTERECTOMY  Jan. 25,2007   Right CEA  . Carotid stenosis surgery on right     2007  . CARPAL TUNNEL RELEASE Left 06/02/2016   Procedure: LEFT CARPAL TUNNEL RELEASE;  Surgeon: Daryll Brod, MD;  Location: [facility];  Service: Orthopedics;  Laterality: Left;  . EYE SURGERY     Catarct surgery and lens implant - bilateral  . PR VEIN BYPASS GRAFT,AORTO-FEM-POP  2005   Revision Right Fem-pop  . PR VEIN BYPASS GRAFT,AORTO-FEM-POP  2002   Right Fem-pop  . RADIOLOGY WITH ANESTHESIA N/A 12/02/2016   Procedure: MRI LUMBAR SPINE WITHOUT;  Surgeon: Medication Radiologist, MD;  Location: [facility];  Service: Radiology;  Laterality: N/A;  . Right leg bypass  2002 -  and amputation of right fifth toe  . SPINE SURGERY  1960   lumbar spine  . [place], 2007   cervical spine  . TOE AMPUTATION  08-2001   right 5th toe amputation       Home Medications    Prior to Admission medications   Medication Sig Start Date End Date Taking? Authorizing Provider  acetaminophen (TYLENOL) 325 MG tablet Take 650 mg by mouth every 6 (six) hours as needed for mild pain.     [provider]  albuterol (VENTOLIN HFA) 108 (90 Base) MCG/ACT inhaler Inhale 2 puffs into the lungs every 6 (six) hours as  needed for wheezing or shortness of breath. 07/08/20   Monterrius Cardosa C, PA-C  amoxicillin-clavulanate (AUGMENTIN) 875-125 MG tablet Take 1 tablet by mouth every 12 (twelve) hours. 07/08/20   Kahlie Deutscher, Elesa Hacker, PA-C  atenolol-chlorthalidone (TENORETIC) 50-25 MG tablet Held as of 05/23/20 05/23/20   Tonia Ghent, MD  azithromycin (ZITHROMAX) 250 MG tablet Take 1 tablet (250 mg total) by mouth daily. Take first 2 tablets together, then 1 every day until finished. 07/08/20   Raesha Coonrod C, PA-C  benzonatate (TESSALON) 200 MG capsule Take 1 capsule (200 mg total) by mouth 3 (three) times daily as needed for up to 7 days for cough. 07/08/20 07/15/20  Bathsheba Durrett C, PA-C  blood glucose meter kit and supplies Dispense based on patient and insurance preference. Use to check blood sugar daily as directed. (FOR ICD-10 E10.9, E11.9). 10/27/18   Tonia Ghent, MD  clopidogrel (PLAVIX) 75 MG tablet TAKE 1 TABLET (75MG) BY MOUTH EVERY DAY 12/08/19   Tonia Ghent, MD  clotrimazole-betamethasone (LOTRISONE) cream APPLY TO AFFECTED AREA TWICE A DAY 06/04/20   Tonia Ghent, MD  ezetimibe-simvastatin (VYTORIN) 10-40 MG tablet TAKE 1 TABLET BY MOUTH EVERY DAY 05/19/20   Tonia Ghent, MD  furosemide (LASIX) 20 MG tablet Take 1 tablet (20 mg total) by mouth daily as needed for fluid. 05/23/20   Tonia Ghent, MD  glucose blood (ACCU-CHEK GUIDE) test strip Use as instructed to check blood sugar daily as directed.  Diagnosis E10.9  Non insulin dependent. 01/19/19   Tonia Ghent, MD  guaiFENesin (MUCINEX) 600 MG 12 hr tablet Take 600 mg by mouth 2 (two) times daily.     [provider]  ipratropium-albuterol (DUONEB) 0.5-2.5 (3) MG/3ML SOLN Take 3 mLs by nebulization 3 (three) times daily. As directed 11/17/18   Collene Gobble, MD  JANUMET 50-500 MG tablet TAKE 1 TABLET BY MOUTH EVERY DAY 02/29/20   Tonia Ghent, MD  levothyroxine (SYNTHROID) 25 MCG tablet Take 2 tablets (50 mcg total) by  mouth daily before breakfast. 03/29/20   Tonia Ghent, MD  loratadine (CLARITIN) 10 MG tablet Take 10 mg by mouth daily. Reported on 01/03/2016    [provider]  Multiple Vitamins-Minerals (CENTRUM SILVER PO) Take 1 tablet by mouth daily.    [provider]  Multiple Vitamins-Minerals (OCUVITE PRESERVISION PO) Take 1 tablet by mouth 2 (two) times daily.    [provider]  nystatin (MYCOSTATIN/NYSTOP) powder Apply topically 3 (three) times daily. 02/10/19   Tonia Ghent, MD  tamsulosin (FLOMAX) 0.4 MG CAPS capsule TAKE 2 CAPSULES BY MOUTH EVERY DAY 05/05/19   Tonia Ghent, MD  umeclidinium-vilanterol [facility] ELLIPTA) 62.5-25 MCG/INH AEPB Inhale 1 puff into the lungs daily. 11/17/18   Collene Gobble, MD  Family History Family History  Problem Relation Age of Onset  . Stroke Mother   . Hypertension Mother   . Heart disease Father   . Heart attack Father   . Cancer Brother   . Hypertension Brother     Social History Social History   Tobacco Use  . Smoking status: Former Smoker    Packs/day: 1.00    Years: 63.00    Pack years: 63.00    Types: Cigarettes    Quit date: 04/09/2004    Years since quitting: 16.2  . Smokeless tobacco: Former Systems developer  . Tobacco comment: Quit smoking in 2005  Vaping Use  . Vaping Use: Never used  Substance Use Topics  . Alcohol use: No    Alcohol/week: 0.0 standard drinks  . Drug use: No     Allergies   Iohexol and Iodine   Review of Systems Review of Systems  Constitutional: Negative for activity change, appetite change, chills, fatigue and fever.  HENT: Negative for congestion, ear pain, rhinorrhea, sinus pressure, sore throat and trouble swallowing.   Eyes: Negative for discharge and redness.  Respiratory: Positive for cough. Negative for chest tightness and shortness of breath.   Cardiovascular: Negative for chest pain.  Gastrointestinal: Negative for abdominal pain, diarrhea, nausea and vomiting.    Musculoskeletal: Negative for myalgias.  Skin: Negative for rash.  Neurological: Negative for dizziness, light-headedness and headaches.     Physical Exam Triage Vital Signs ED Triage Vitals  Enc Vitals Group     BP 07/08/20 1559 (!) 172/100     Pulse Rate 07/08/20 1559 95     Resp 07/08/20 1559 20     Temp 07/08/20 1559 97.6 F (36.4 C)     Temp Source 07/08/20 1559 Oral     SpO2 07/08/20 1559 91 %     Weight --      Height --      Head Circumference --      Peak Flow --      Pain Score 07/08/20 1600 0     Pain Loc --      Pain Edu? --      Excl. in [place]? --    No data found.  Updated Vital Signs BP (!) 172/100 (BP Location: Left Arm)   Pulse 95   Temp 97.6 F (36.4 C) (Oral)   Resp 20   SpO2 91%   Visual Acuity Right Eye Distance:   Left Eye Distance:   Bilateral Distance:    Right Eye Near:   Left Eye Near:    Bilateral Near:     Physical Exam Vitals and nursing note reviewed.  Constitutional:      Appearance: He is well-developed.     Comments: No acute distress  HENT:     Head: Normocephalic and atraumatic.     Nose: Nose normal.     Mouth/Throat:     Comments: Oral mucosa pink and moist, no tonsillar enlargement or exudate. Posterior pharynx patent and nonerythematous, no uvula deviation or swelling. Normal phonation. Eyes:     Conjunctiva/sclera: Conjunctivae normal.     Comments: Wearing glasses  Cardiovascular:     Rate and Rhythm: Normal rate.  Pulmonary:     Effort: Pulmonary effort is normal. No respiratory distress.     Comments: Breathing comfortably at rest, CTABL, no wheezing, rales or other adventitious sounds auscultated  Appears tachypneic and shortness of breath with exertion, but improves with rest Abdominal:     General: There  is no distension.  Musculoskeletal:        General: Normal range of motion.     Cervical back: Neck supple.     Comments: Ambulates slowly with walker  Skin:    General: Skin is warm and dry.   Neurological:     Mental Status: He is alert and oriented to person, place, and time.      UC Treatments / Results  Labs (all labs ordered are listed, but only abnormal results are displayed) Labs Reviewed  NOVEL CORONAVIRUS, NAA    EKG   Radiology DG Chest 2 View  Result Date: 07/08/2020 CLINICAL DATA:  86 year old male with history of cough with sputum production and small amount of hemoptysis. EXAM: CHEST - 2 VIEW COMPARISON:  Chest x-ray 11/02/2016. FINDINGS: Lung volumes are increased. Diffuse peribronchial cuffing with widespread areas of interstitial prominence scattered throughout the lungs bilaterally, most severe throughout the mid to lower right lung. No confluent consolidative airspace disease. No pleural effusions. No evidence of pulmonary edema. No pneumothorax. Heart size is normal. Upper mediastinal contours are within normal limits. Aortic atherosclerosis. IMPRESSION: 1. Findings are concerning for bronchitis and potential bronchopneumonia developing throughout the right middle and lower lobes. 2. Aortic atherosclerosis. Electronically Signed   By: Vinnie Langton M.D.   On: 07/08/2020 16:28    Procedures Procedures (including critical care time)  Medications Ordered in UC Medications - No data to display  Initial Impression / Assessment and Plan / UC Course  I have reviewed the triage vital signs and the nursing notes.  Pertinent labs & imaging results that were available during my care of the patient were reviewed by me and considered in my medical decision making (see chart for details).     X-ray concerning for bronchial pneumonia.  Given appearance, less suggestive of Covid, but will obtain Covid test for screening.  O2 averaging around 91%, on chart review prior oxygen readings have been remained in the low 90s.  O2 level seems to be at baseline, wife agrees.  Given patient's age discussed concerns over possible need for admission, through shared decision  making opted to proceed with outpatient therapy with extremely close monitoring, any worsening patient to follow-up in emergency room.  Augmentin and azithromycin prescribed Tessalon to use as needed for cough.  Follow-up for repeat chest x-ray in 4 to 6 weeks to ensure resolution.  Discussed strict return precautions. Patient verbalized understanding and is agreeable with plan.  Final Clinical Impressions(s) / UC Diagnoses   Final diagnoses:  Community acquired pneumonia of right middle lobe of lung  Hemoptysis  Encounter for screening for COVID-19     Discharge Instructions     Chest x-ray concerning for pneumonia Begin Augmentin twice daily for 1 week Azithromycin-2 tablets today, 1 tablet for the following 4 days Tessalon/benzonatate every 8 hours as needed for cough Albuterol inhaler as needed for shortness of breath, chest tightness If any symptoms not improving or worsening despite adding in above antibiotics please follow-up in the emergency room Follow-up for repeat chest x-ray in 4 to 6 weeks to ensure resolution    ED Prescriptions    Medication Sig Dispense Auth. Provider   amoxicillin-clavulanate (AUGMENTIN) 875-125 MG tablet Take 1 tablet by mouth every 12 (twelve) hours. 14 tablet Nature Kueker C, PA-C   azithromycin (ZITHROMAX) 250 MG tablet Take 1 tablet (250 mg total) by mouth daily. Take first 2 tablets together, then 1 every day until finished. 6 tablet Keonte Daubenspeck, [place] C, PA-C  benzonatate (TESSALON) 200 MG capsule Take 1 capsule (200 mg total) by mouth 3 (three) times daily as needed for up to 7 days for cough. 28 capsule Kashawn Manzano C, PA-C   albuterol (VENTOLIN HFA) 108 (90 Base) MCG/ACT inhaler Inhale 2 puffs into the lungs every 6 (six) hours as needed for wheezing or shortness of breath. 18 g Jamaris Theard, [place] C, PA-C     PDMP not reviewed this encounter.   Janith Lima, [place] 07/09/20 [phone]

## 2020-07-10 ENCOUNTER — Encounter: Payer: Self-pay | Admitting: Family Medicine

## 2020-07-10 LAB — NOVEL CORONAVIRUS, NAA: SARS-CoV-2, NAA: NOT DETECTED

## 2020-07-19 ENCOUNTER — Emergency Department: Payer: Medicare PPO

## 2020-07-19 ENCOUNTER — Inpatient Hospital Stay
Admission: EM | Admit: 2020-07-19 | Discharge: 2020-07-24 | DRG: 291 | Disposition: A | Discharge status: Home Health | Payer: Medicare PPO | Attending: Internal Medicine | Admitting: Internal Medicine

## 2020-07-19 ENCOUNTER — Other Ambulatory Visit: Payer: Self-pay

## 2020-07-19 ENCOUNTER — Ambulatory Visit
Admission: EM | Admit: 2020-07-19 | Discharge: 2020-07-19 | Disposition: A | Discharge status: Home / Self Care | Payer: Medicare PPO

## 2020-07-19 ENCOUNTER — Ambulatory Visit: Payer: Medicare PPO

## 2020-07-19 DIAGNOSIS — N400 Enlarged prostate without lower urinary tract symptoms: Secondary | ICD-10-CM | POA: Diagnosis present

## 2020-07-19 DIAGNOSIS — R0682 Tachypnea, not elsewhere classified: Secondary | ICD-10-CM

## 2020-07-19 DIAGNOSIS — J449 Chronic obstructive pulmonary disease, unspecified: Secondary | ICD-10-CM | POA: Diagnosis not present

## 2020-07-19 DIAGNOSIS — I739 Peripheral vascular disease, unspecified: Secondary | ICD-10-CM | POA: Diagnosis present

## 2020-07-19 DIAGNOSIS — J9601 Acute respiratory failure with hypoxia: Secondary | ICD-10-CM | POA: Diagnosis present

## 2020-07-19 DIAGNOSIS — H3530 Unspecified macular degeneration: Secondary | ICD-10-CM | POA: Diagnosis present

## 2020-07-19 DIAGNOSIS — R0602 Shortness of breath: Secondary | ICD-10-CM | POA: Diagnosis not present

## 2020-07-19 DIAGNOSIS — R0689 Other abnormalities of breathing: Secondary | ICD-10-CM | POA: Diagnosis not present

## 2020-07-19 DIAGNOSIS — Z8249 Family history of ischemic heart disease and other diseases of the circulatory system: Secondary | ICD-10-CM | POA: Diagnosis not present

## 2020-07-19 DIAGNOSIS — E039 Hypothyroidism, unspecified: Secondary | ICD-10-CM | POA: Diagnosis not present

## 2020-07-19 DIAGNOSIS — I10 Essential (primary) hypertension: Secondary | ICD-10-CM | POA: Diagnosis not present

## 2020-07-19 DIAGNOSIS — Z89421 Acquired absence of other right toe(s): Secondary | ICD-10-CM

## 2020-07-19 DIAGNOSIS — Z79899 Other long term (current) drug therapy: Secondary | ICD-10-CM | POA: Diagnosis not present

## 2020-07-19 DIAGNOSIS — Z888 Allergy status to other drugs, medicaments and biological substances status: Secondary | ICD-10-CM

## 2020-07-19 DIAGNOSIS — J84112 Idiopathic pulmonary fibrosis: Secondary | ICD-10-CM | POA: Diagnosis present

## 2020-07-19 DIAGNOSIS — Z8673 Personal history of transient ischemic attack (TIA), and cerebral infarction without residual deficits: Secondary | ICD-10-CM | POA: Diagnosis not present

## 2020-07-19 DIAGNOSIS — R0600 Dyspnea, unspecified: Secondary | ICD-10-CM | POA: Diagnosis not present

## 2020-07-19 DIAGNOSIS — Z20822 Contact with and (suspected) exposure to covid-19: Secondary | ICD-10-CM | POA: Diagnosis not present

## 2020-07-19 DIAGNOSIS — I2510 Atherosclerotic heart disease of native coronary artery without angina pectoris: Secondary | ICD-10-CM | POA: Diagnosis not present

## 2020-07-19 DIAGNOSIS — I110 Hypertensive heart disease with heart failure: Secondary | ICD-10-CM | POA: Diagnosis not present

## 2020-07-19 DIAGNOSIS — Z809 Family history of malignant neoplasm, unspecified: Secondary | ICD-10-CM | POA: Diagnosis not present

## 2020-07-19 DIAGNOSIS — J189 Pneumonia, unspecified organism: Secondary | ICD-10-CM | POA: Diagnosis not present

## 2020-07-19 DIAGNOSIS — R0902 Hypoxemia: Secondary | ICD-10-CM

## 2020-07-19 DIAGNOSIS — E1151 Type 2 diabetes mellitus with diabetic peripheral angiopathy without gangrene: Secondary | ICD-10-CM | POA: Diagnosis present

## 2020-07-19 DIAGNOSIS — R531 Weakness: Secondary | ICD-10-CM | POA: Diagnosis not present

## 2020-07-19 DIAGNOSIS — Z7902 Long term (current) use of antithrombotics/antiplatelets: Secondary | ICD-10-CM

## 2020-07-19 DIAGNOSIS — Z85828 Personal history of other malignant neoplasm of skin: Secondary | ICD-10-CM | POA: Diagnosis not present

## 2020-07-19 DIAGNOSIS — Z87891 Personal history of nicotine dependence: Secondary | ICD-10-CM | POA: Diagnosis not present

## 2020-07-19 DIAGNOSIS — Z823 Family history of stroke: Secondary | ICD-10-CM | POA: Diagnosis not present

## 2020-07-19 DIAGNOSIS — J440 Chronic obstructive pulmonary disease with acute lower respiratory infection: Secondary | ICD-10-CM | POA: Diagnosis present

## 2020-07-19 DIAGNOSIS — E876 Hypokalemia: Secondary | ICD-10-CM | POA: Diagnosis not present

## 2020-07-19 DIAGNOSIS — I509 Heart failure, unspecified: Secondary | ICD-10-CM | POA: Diagnosis not present

## 2020-07-19 DIAGNOSIS — Z9181 History of falling: Secondary | ICD-10-CM

## 2020-07-19 DIAGNOSIS — J849 Interstitial pulmonary disease, unspecified: Secondary | ICD-10-CM | POA: Diagnosis not present

## 2020-07-19 DIAGNOSIS — J9621 Acute and chronic respiratory failure with hypoxia: Secondary | ICD-10-CM | POA: Diagnosis not present

## 2020-07-19 DIAGNOSIS — J441 Chronic obstructive pulmonary disease with (acute) exacerbation: Secondary | ICD-10-CM | POA: Diagnosis present

## 2020-07-19 DIAGNOSIS — R000 Tachycardia, unspecified: Secondary | ICD-10-CM | POA: Diagnosis not present

## 2020-07-19 DIAGNOSIS — I5033 Acute on chronic diastolic (congestive) heart failure: Secondary | ICD-10-CM | POA: Diagnosis not present

## 2020-07-19 DIAGNOSIS — E785 Hyperlipidemia, unspecified: Secondary | ICD-10-CM | POA: Diagnosis not present

## 2020-07-19 DIAGNOSIS — Z66 Do not resuscitate: Secondary | ICD-10-CM | POA: Diagnosis not present

## 2020-07-19 DIAGNOSIS — Z8701 Personal history of pneumonia (recurrent): Secondary | ICD-10-CM

## 2020-07-19 LAB — COMPREHENSIVE METABOLIC PANEL
ALT: 10 U/L (ref 0–44)
AST: 37 U/L (ref 15–41)
Albumin: 3.6 g/dL (ref 3.5–5.0)
Alkaline Phosphatase: 51 U/L (ref 38–126)
Anion gap: 11 (ref 5–15)
BUN: 12 mg/dL (ref 8–23)
CO2: 23 mmol/L (ref 22–32)
Calcium: 8.8 mg/dL — ABNORMAL LOW (ref 8.9–10.3)
Chloride: 100 mmol/L (ref 98–111)
Creatinine, Ser: 1.19 mg/dL (ref 0.61–1.24)
GFR calc Af Amer: >60 mL/min (ref >60)
GFR calc non Af Amer: 52 mL/min — ABNORMAL LOW (ref >60)
Glucose, Bld: 110 mg/dL — ABNORMAL HIGH (ref 70–99)
Potassium: 4 mmol/L (ref 3.5–5.1)
Sodium: 134 mmol/L — ABNORMAL LOW (ref 135–145)
Total Bilirubin: 1.7 mg/dL — ABNORMAL HIGH (ref 0.3–1.2)
Total Protein: 6 g/dL — ABNORMAL LOW (ref 6.5–8.1)

## 2020-07-19 LAB — URINALYSIS, COMPLETE (UACMP) WITH MICROSCOPIC
Bacteria, UA: NONE SEEN
Bilirubin Urine: NEGATIVE
Glucose, UA: NEGATIVE mg/dL
Hgb urine dipstick: NEGATIVE
Ketones, ur: NEGATIVE mg/dL
Leukocytes,Ua: NEGATIVE
Nitrite: NEGATIVE
Protein, ur: NEGATIVE mg/dL
Specific Gravity, Urine: 1.008 (ref 1.005–1.030)
pH: 7 (ref 5.0–8.0)

## 2020-07-19 LAB — SARS CORONAVIRUS 2 BY RT PCR (HOSPITAL ORDER, PERFORMED IN ~~LOC~~ HOSPITAL LAB): SARS Coronavirus 2: NEGATIVE

## 2020-07-19 LAB — CBC WITH DIFFERENTIAL/PLATELET
Abs Immature Granulocytes: 0.05 10*3/uL (ref 0.00–0.07)
Basophils Absolute: 0 10*3/uL (ref 0.0–0.1)
Basophils Relative: 1 %
Eosinophils Absolute: 0.2 10*3/uL (ref 0.0–0.5)
Eosinophils Relative: 2 %
HCT: 43.4 % (ref 39.0–52.0)
Hemoglobin: 14.9 g/dL (ref 13.0–17.0)
Immature Granulocytes: 1 %
Lymphocytes Relative: 9 %
Lymphs Abs: 0.6 10*3/uL — ABNORMAL LOW (ref 0.7–4.0)
MCH: 35.8 pg — ABNORMAL HIGH (ref 26.0–34.0)
MCHC: 34.3 g/dL (ref 30.0–36.0)
MCV: 104.3 fL — ABNORMAL HIGH (ref 80.0–100.0)
Monocytes Absolute: 0.8 10*3/uL (ref 0.1–1.0)
Monocytes Relative: 10 %
Neutro Abs: 5.7 10*3/uL (ref 1.7–7.7)
Neutrophils Relative %: 77 %
Platelets: 192 10*3/uL (ref 150–400)
RBC: 4.16 MIL/uL — ABNORMAL LOW (ref 4.22–5.81)
RDW: 13.8 % (ref 11.5–15.5)
WBC: 7.4 10*3/uL (ref 4.0–10.5)
nRBC: 0 % (ref 0.0–0.2)

## 2020-07-19 LAB — LACTIC ACID, PLASMA: Lactic Acid, Venous: 1.5 mmol/L (ref 0.5–1.9)

## 2020-07-19 LAB — PROTIME-INR
INR: 1.1 (ref 0.8–1.2)
Prothrombin Time: 14 seconds (ref 11.4–15.2)

## 2020-07-19 LAB — BRAIN NATRIURETIC PEPTIDE
B Natriuretic Peptide: 543.1 pg/mL — ABNORMAL HIGH (ref 0.0–100.0)
B Natriuretic Peptide: 506.4 pg/mL — ABNORMAL HIGH (ref 0.0–100.0)

## 2020-07-19 LAB — APTT: aPTT: 34 seconds (ref 24–36)

## 2020-07-19 MED ORDER — GUAIFENESIN ER 600 MG PO TB12
600.0000 mg | ORAL_TABLET | Freq: Two times a day (BID) | ORAL | Status: DC
  Administered 2020-07-19: 600 mg via ORAL
  Filled 2020-07-19: qty 1

## 2020-07-19 MED ORDER — SODIUM CHLORIDE 0.9% FLUSH: 3.0000 mL | INTRAVENOUS | Status: DC | PRN

## 2020-07-19 MED ORDER — CLOTRIMAZOLE 1 % EX CREA
TOPICAL_CREAM | Freq: Two times a day (BID) | CUTANEOUS | Status: DC
  Filled 2020-07-19 (×3): qty 15

## 2020-07-19 MED ORDER — SODIUM CHLORIDE 0.9% FLUSH
3.0000 mL | Freq: Two times a day (BID) | INTRAVENOUS | Status: DC
  Administered 2020-07-20 – 2020-07-24 (×9): 3 mL via INTRAVENOUS

## 2020-07-19 MED ORDER — ACETAMINOPHEN 325 MG PO TABS: 650.0000 mg | ORAL_TABLET | Freq: Four times a day (QID) | ORAL | Status: DC | PRN

## 2020-07-19 MED ORDER — EZETIMIBE 10 MG PO TABS
10.0000 mg | ORAL_TABLET | Freq: Every day | ORAL | Status: DC
  Administered 2020-07-20 – 2020-07-24 (×5): 10 mg via ORAL
  Filled 2020-07-19 (×5): qty 1

## 2020-07-19 MED ORDER — ACETAMINOPHEN 325 MG PO TABS
650.0000 mg | ORAL_TABLET | ORAL | Status: DC | PRN
  Administered 2020-07-19 – 2020-07-23 (×5): 650 mg via ORAL
  Filled 2020-07-19 (×5): qty 2

## 2020-07-19 MED ORDER — ENOXAPARIN SODIUM 40 MG/0.4ML ~~LOC~~ SOLN
40.0000 mg | SUBCUTANEOUS | Status: DC
  Administered 2020-07-19 – 2020-07-23 (×5): 40 mg via SUBCUTANEOUS
  Filled 2020-07-19 (×5): qty 0.4

## 2020-07-19 MED ORDER — FUROSEMIDE 10 MG/ML IJ SOLN
60.0000 mg | Freq: Once | INTRAMUSCULAR | Status: AC
  Administered 2020-07-19: 60 mg via INTRAVENOUS
  Filled 2020-07-19: qty 8

## 2020-07-19 MED ORDER — SODIUM CHLORIDE 0.9 % IV SOLN: 250.0000 mL | INTRAVENOUS | Status: DC | PRN

## 2020-07-19 MED ORDER — ALBUTEROL SULFATE (2.5 MG/3ML) 0.083% IN NEBU
2.5000 mg | INHALATION_SOLUTION | Freq: Four times a day (QID) | RESPIRATORY_TRACT | Status: DC | PRN
  Administered 2020-07-19: 2.5 mg via RESPIRATORY_TRACT
  Filled 2020-07-19: qty 3

## 2020-07-19 MED ORDER — ONDANSETRON HCL 4 MG/2ML IJ SOLN: 4.0000 mg | Freq: Four times a day (QID) | INTRAMUSCULAR | Status: DC | PRN

## 2020-07-19 MED ORDER — LEVOTHYROXINE SODIUM 50 MCG PO TABS
50.0000 ug | ORAL_TABLET | Freq: Every day | ORAL | Status: DC
  Administered 2020-07-20 – 2020-07-24 (×5): 50 ug via ORAL
  Filled 2020-07-19 (×5): qty 1

## 2020-07-19 MED ORDER — EZETIMIBE-SIMVASTATIN 10-40 MG PO TABS: 1.0000 | ORAL_TABLET | Freq: Every day | ORAL | Status: DC

## 2020-07-19 MED ORDER — SIMVASTATIN 40 MG PO TABS
40.0000 mg | ORAL_TABLET | Freq: Every day | ORAL | Status: DC
  Administered 2020-07-20 – 2020-07-23 (×4): 40 mg via ORAL
  Filled 2020-07-19 (×5): qty 1

## 2020-07-19 MED ORDER — FUROSEMIDE 10 MG/ML IJ SOLN
40.0000 mg | Freq: Two times a day (BID) | INTRAMUSCULAR | Status: DC
  Administered 2020-07-20 – 2020-07-22 (×5): 40 mg via INTRAVENOUS
  Filled 2020-07-19 (×5): qty 4

## 2020-07-19 MED ORDER — CLOPIDOGREL BISULFATE 75 MG PO TABS
75.0000 mg | ORAL_TABLET | Freq: Every day | ORAL | Status: DC
  Administered 2020-07-19 – 2020-07-24 (×6): 75 mg via ORAL
  Filled 2020-07-19 (×6): qty 1

## 2020-07-19 MED ORDER — ADULT MULTIVITAMIN W/MINERALS CH
1.0000 | ORAL_TABLET | Freq: Every day | ORAL | Status: DC
  Administered 2020-07-20 – 2020-07-24 (×5): 1 via ORAL
  Filled 2020-07-19 (×5): qty 1

## 2020-07-19 NOTE — ED Notes (Signed)
Pt given graham crackers and water.

## 2020-07-19 NOTE — ED Provider Notes (Signed)
[facility] Emergency Department Provider Note    First MD Initiated Contact with Patient 07/19/20 1611     (approximate)  I have reviewed the triage vital signs and the nursing notes.   HISTORY  Chief Complaint Shortness of Breath    HPI Dylan Knight is a 86 y.o. male   with the below listed past medical history presents to the ER for evaluation of shortness of breath generalized malaise fevers and "feeling sick.  ".  Patient was recently on antibiotics for treatment of community-acquired pneumonia.  Feeling improved.  Had outpatient follow-up with urgent care today as he was feeling unwell and was found to be hypoxic in the 70s.  Does not wear home oxygen.  Does have a history of CHF and has been taking Lasix.   Past Medical History:  Diagnosis Date  . Arthritis    left hip  . BPH (benign prostatic hyperplasia)   . Cancer [facility])    Skin cancers removed  . Carotid artery occlusion   . COPD (chronic obstructive pulmonary disease) ([place])   . Diabetes mellitus without complication ([place])   . Hyperlipidemia   . Hypertension   . Hypothyroidism   . Macular degeneration   . Peripheral arterial disease ([place]) 2002  . Stroke [facility]) 2005   Family History  Problem Relation Age of Onset  . Stroke Mother   . Hypertension Mother   . Heart disease Father   . Heart attack Father   . Cancer Brother   . Hypertension Brother    Past Surgical History:  Procedure Laterality Date  . ABDOMINAL AORTIC ANEURYSM REPAIR  2003   Aorto-left femoral-right iliac BPG by Dr. Scot Dock  . Aortic aneursym surgery     2003- Dr. Scot Dock  . BACK SURGERY     1960  . CAROTID ENDARTERECTOMY  Jan. 25,2007   Right CEA  . Carotid stenosis surgery on right     2007  . CARPAL TUNNEL RELEASE Left 06/02/2016   Procedure: LEFT CARPAL TUNNEL RELEASE;  Surgeon: Daryll Brod, MD;  Location: [place];  Service: Orthopedics;  Laterality: Left;  . EYE SURGERY     Catarct  surgery and lens implant - bilateral  . PR VEIN BYPASS GRAFT,AORTO-FEM-POP  2005   Revision Right Fem-pop  . PR VEIN BYPASS GRAFT,AORTO-FEM-POP  2002   Right Fem-pop  . RADIOLOGY WITH ANESTHESIA N/A 12/02/2016   Procedure: MRI LUMBAR SPINE WITHOUT;  Surgeon: Medication Radiologist, MD;  Location: [place];  Service: Radiology;  Laterality: N/A;  . Right leg bypass     2002 -  and amputation of right fifth toe  . SPINE SURGERY  1960   lumbar spine  . [place], 2007   cervical spine  . TOE AMPUTATION  08-2001   right 5th toe amputation   Patient Active Problem List   Diagnosis Date Noted  . Edema 05/26/2020  . ETD (eustachian tube dysfunction) 04/24/2020  . Insomnia 10/15/2019  . Medicare annual wellness visit, subsequent 07/17/2019  . Tinea cruris 02/12/2019  . Urinary retention 02/10/2019  . Dysuria 02/07/2019  . Restlessness 01/23/2019  . At risk for falls 07/21/2018  . Laceration of left hand 11/13/2017  . Gait abnormality 05/19/2017  . Hyperlipemia 03/29/2017  . HTN (hypertension) 03/29/2017  . Advance care planning 02/25/2017  . Macular degeneration 02/25/2017  . Transient speech disturbance   . Bilateral carotid artery stenosis   . History of stroke   .  TIA (transient ischemic attack) 01/30/2017  . Acute ischemic stroke ([place]) 01/30/2017  . Low back pain with right-sided sciatica 12/01/2016  . Diabetes mellitus type 2, controlled ([place]) 12/01/2016  . CAD (coronary artery disease) 12/01/2016  . Hypothyroidism 12/01/2016  . Back pain 11/02/2016  . Aftercare following surgery of the circulatory system, [place] 06/28/2013  . Cough 04/18/2013  . COPD (chronic obstructive pulmonary disease) ([place]) 11/22/2012  . Occlusion and stenosis of carotid artery without mention of cerebral infarction 06/29/2012  . Peripheral vascular disease ([place]) 12/30/2011      Prior to Admission medications   Medication Sig Start Date End Date Taking? Authorizing Provider  acetaminophen  (TYLENOL) 325 MG tablet Take 650 mg by mouth every 6 (six) hours as needed for mild pain.    Yes [provider]  albuterol (VENTOLIN HFA) 108 (90 Base) MCG/ACT inhaler Inhale 2 puffs into the lungs every 6 (six) hours as needed for wheezing or shortness of breath. 07/08/20  Yes Wieters, Hallie C, PA-C  clopidogrel (PLAVIX) 75 MG tablet TAKE 1 TABLET (75MG ) BY MOUTH EVERY DAY 12/08/19  Yes Tonia Ghent, MD  clotrimazole-betamethasone (LOTRISONE) cream APPLY TO AFFECTED AREA TWICE A DAY 06/04/20  Yes Tonia Ghent, MD  ezetimibe-simvastatin (VYTORIN) 10-40 MG tablet TAKE 1 TABLET BY MOUTH EVERY DAY 05/19/20  Yes Tonia Ghent, MD  furosemide (LASIX) 20 MG tablet Take 1 tablet (20 mg total) by mouth daily as needed for fluid. 05/23/20  Yes Tonia Ghent, MD  guaiFENesin (MUCINEX) 600 MG 12 hr tablet Take 600 mg by mouth 2 (two) times daily.    Yes [provider]  levothyroxine (SYNTHROID) 25 MCG tablet Take 2 tablets (50 mcg total) by mouth daily before breakfast. 03/29/20  Yes Tonia Ghent, MD  Multiple Vitamins-Minerals (CENTRUM SILVER PO) Take 1 tablet by mouth daily.   Yes [provider]  atenolol-chlorthalidone (TENORETIC) 50-25 MG tablet Held as of 05/23/20 Patient taking differently: Take 0.05 tablets by mouth daily. Held as of 05/23/20 05/23/20   Tonia Ghent, MD    Allergies Iohexol and Iodine    Social History Social History   Tobacco Use  . Smoking status: Former Smoker    Packs/day: 1.00    Years: 63.00    Pack years: 63.00    Types: Cigarettes    Quit date: 04/09/2004    Years since quitting: 16.2  . Smokeless tobacco: Former Systems developer  . Tobacco comment: Quit smoking in 2005  Vaping Use  . Vaping Use: Never used  Substance Use Topics  . Alcohol use: No    Alcohol/week: 0.0 standard drinks  . Drug use: No    Review of Systems Patient denies headaches, rhinorrhea, blurry vision, numbness, shortness of breath, chest pain, edema,  cough, abdominal pain, nausea, vomiting, diarrhea, dysuria, fevers, rashes or hallucinations unless otherwise stated above in HPI. ____________________________________________   PHYSICAL EXAM:  VITAL SIGNS: Vitals:   07/19/20 1700 07/19/20 1730  BP: 123/81 133/82  Pulse: 94 78  Resp: 18 17  Temp:    SpO2: 96% 90%    Constitutional: Alert and oriented.  Eyes: Conjunctivae are normal.  Head: Atraumatic. Nose: No congestion/rhinnorhea. Mouth/Throat: Mucous membranes are moist.   Neck: No stridor. Painless ROM.  Cardiovascular: Normal rate, regular rhythm. Grossly normal heart sounds.  Good peripheral circulation. Respiratory: Normal respiratory effort.  No retractions. Lungs with coarse bibasilar breathsounds Gastrointestinal: Soft and nontender. No distention. No abdominal bruits. No CVA tenderness. Genitourinary:  Musculoskeletal: No  lower extremity tenderness nor edema.  No joint effusions. Neurologic:  Normal speech and language. No gross focal neurologic deficits are appreciated. No facial droop Skin:  Skin is warm, dry and intact. No rash noted. Psychiatric: Mood and affect are normal. Speech and behavior are normal.  ____________________________________________   LABS (all labs ordered are listed, but only abnormal results are displayed)  Results for orders placed or performed during the hospital encounter of 07/19/20 (from the past 24 hour(s))  Lactic acid, plasma     Status: None   Collection Time: 07/19/20  4:25 PM  Result Value Ref Range   Lactic Acid, Venous 1.5 0.5 - 1.9 mmol/L  Comprehensive metabolic panel     Status: Abnormal   Collection Time: 07/19/20  4:25 PM  Result Value Ref Range   Sodium 134 (L) 135 - 145 mmol/L   Potassium 4.0 3.5 - 5.1 mmol/L   Chloride 100 98 - 111 mmol/L   CO2 23 22 - 32 mmol/L   Glucose, Bld 110 (H) 70 - 99 mg/dL   BUN 12 8 - 23 mg/dL   Creatinine, Ser 1.19 0.61 - 1.24 mg/dL   Calcium 8.8 (L) 8.9 - 10.3 mg/dL   Total  Protein 6.0 (L) 6.5 - 8.1 g/dL   Albumin 3.6 3.5 - 5.0 g/dL   AST 37 15 - 41 U/L   ALT 10 0 - 44 U/L   Alkaline Phosphatase 51 38 - 126 U/L   Total Bilirubin 1.7 (H) 0.3 - 1.2 mg/dL   GFR calc non Af Amer 52 (L) >60 mL/min   GFR calc Af Amer >60 >60 mL/min   Anion gap 11 5 - 15  CBC WITH DIFFERENTIAL     Status: Abnormal   Collection Time: 07/19/20  4:25 PM  Result Value Ref Range   WBC 7.4 4.0 - 10.5 K/uL   RBC 4.16 (L) 4.22 - 5.81 MIL/uL   Hemoglobin 14.9 13.0 - 17.0 g/dL   HCT 43.4 39 - 52 %   MCV 104.3 (H) 80.0 - 100.0 fL   MCH 35.8 (H) 26.0 - 34.0 pg   MCHC 34.3 30.0 - 36.0 g/dL   RDW 13.8 11.5 - 15.5 %   Platelets 192 150 - 400 K/uL   nRBC 0.0 0.0 - 0.2 %   Neutrophils Relative % 77 %   Neutro Abs 5.7 1.7 - 7.7 K/uL   Lymphocytes Relative 9 %   Lymphs Abs 0.6 (L) 0.7 - 4.0 K/uL   Monocytes Relative 10 %   Monocytes Absolute 0.8 0 - 1 K/uL   Eosinophils Relative 2 %   Eosinophils Absolute 0.2 0 - 0 K/uL   Basophils Relative 1 %   Basophils Absolute 0.0 0 - 0 K/uL   Immature Granulocytes 1 %   Abs Immature Granulocytes 0.05 0.00 - 0.07 K/uL  Protime-INR     Status: None   Collection Time: 07/19/20  4:25 PM  Result Value Ref Range   Prothrombin Time 14.0 11.4 - 15.2 seconds   INR 1.1 0.8 - 1.2  APTT     Status: None   Collection Time: 07/19/20  4:25 PM  Result Value Ref Range   aPTT 34 24 - 36 seconds  Brain natriuretic peptide     Status: Abnormal   Collection Time: 07/19/20  4:25 PM  Result Value Ref Range   B Natriuretic Peptide 543.1 (H) 0.0 - 100.0 pg/mL  SARS Coronavirus 2 by RT PCR (hospital order, performed in [place]  hospital lab) Nasopharyngeal Nasopharyngeal Swab     Status: None   Collection Time: 07/19/20  4:55 PM   Specimen: Nasopharyngeal Swab  Result Value Ref Range   SARS Coronavirus 2 NEGATIVE NEGATIVE   ____________________________________________  EKG My review and personal interpretation at Time: 16:23   Indication: weakness  Rate:  90  Rhythm: sinus Axis: normal Other: normal intervals, no stemi ____________________________________________  RADIOLOGY  I personally reviewed all radiographic images ordered to evaluate for the above acute complaints and reviewed radiology reports and findings.  These findings were personally discussed with the patient.  Please see medical record for radiology report.  ____________________________________________   PROCEDURES  Procedure(s) performed:  .Critical Care Performed by: Merlyn Lot, MD Authorized by: Merlyn Lot, MD   Critical care provider statement:    Critical care time (minutes):  35   Critical care time was exclusive of:  Separately billable procedures and treating other patients   Critical care was necessary to treat or prevent imminent or life-threatening deterioration of the following conditions:  Respiratory failure   Critical care was time spent personally by me on the following activities:  Development of treatment plan with patient or surrogate, discussions with consultants, evaluation of patient's response to treatment, examination of patient, obtaining history from patient or surrogate, ordering and performing treatments and interventions, ordering and review of laboratory studies, ordering and review of radiographic studies, pulse oximetry, re-evaluation of patient's condition and review of old charts      Critical Care performed: yes ____________________________________________   INITIAL IMPRESSION / [place] / ED COURSE  Pertinent labs & imaging results that were available during my care of the patient were reviewed by me and considered in my medical decision making (see chart for details).   DDX: covid 19, Asthma, copd, CHF, pna, ptx, malignancy, Pe, anemia   Dylan Knight is a 86 y.o. who presents to the ED with symptoms as described above.  Patient is afebrile but is showing signs of acute respiratory failure with hypoxia  requiring supplemental oxygen of 3 L nasal cannula.  Exam is concerning for development of congestive heart failure.  Have a lower suspicion for COPD.  He is not febrile no white count.  Covid negative.  Have a lower suspicion for PE given his exam findings suggesting congestive heart failure will give IV Lasix.  Patient states that he is a DNR/DNI.  Have discussed with the patient and available family all diagnostics and treatments performed thus far and all questions were answered to the best of my ability. The patient demonstrates understanding and agreement with plan.      The patient was evaluated in Emergency Department today for the symptoms described in the history of present illness. He/she was evaluated in the context of the global COVID-19 pandemic, which necessitated consideration that the patient might be at risk for infection with the SARS-CoV-2 virus that causes COVID-19. Institutional protocols and algorithms that pertain to the evaluation of patients at risk for COVID-19 are in a state of rapid change based on information released by regulatory bodies including the CDC and federal and state organizations. These policies and algorithms were followed during the patient's care in the ED.  As part of my medical decision making, I reviewed the following data within the [place] notes reviewed and incorporated, Labs reviewed, notes from prior ED visits and [place] Controlled Substance Database   ____________________________________________   FINAL CLINICAL IMPRESSION(S) / ED DIAGNOSES  Final diagnoses:  Acute respiratory failure with hypoxia (HCC)      NEW MEDICATIONS STARTED DURING THIS VISIT:  New Prescriptions   No medications on file     Note:  This document was prepared using Dragon voice recognition software and may include unintentional dictation errors.    Merlyn Lot, MD 07/19/20 [phone]

## 2020-07-19 NOTE — ED Triage Notes (Addendum)
Pt here via [facility] from urgent care.  Pt diagnosed with pneumonia last week, pt completed abx and was using an inhaler. Pt states he had been feeling better. Yesterday, pt started experiencing shortness of breath and weakness. Per EMS, pt was desatting with ambulation at the urgent care to 70's. Pt satting at 96% on 2L [place] with EMS.  Ems vss- co2 22, hr 90's, bp 140/70, rr 25-32.

## 2020-07-19 NOTE — ED Triage Notes (Signed)
Pt c/o SOB with labored breathing and O2 sats 77% on arrival. Pt placed on 2l/min/[place]. Pt speaking in single words.

## 2020-07-19 NOTE — ED Notes (Signed)
Pt assisted back into bed after moderate BM.

## 2020-07-19 NOTE — ED Triage Notes (Signed)
Pt states using his inhaler helps with his SOB, states almost out.

## 2020-07-19 NOTE — H&P (Signed)
History and Physical   Dylan Knight EUM:[id number] DOB: [birth year] DOA: 07/19/2020  Referring MD/NP/PA: Dr. Quentin Cornwall  PCP: Tonia Ghent, MD   Outpatient Specialists: None  Patient coming from: Home  Chief Complaint: Shortness of breath  HPI: Dylan Knight is a 86 y.o. male with medical history significant of coronary artery disease, osteoarthritis, BPH, carotid artery disease, diabetes, hypertension, hyperlipidemia, hypothyroidism and peripheral vascular disease, history of CVA who has not seen a cardiologist apparently for over 10 years.  He was recently treated for community-acquired pneumonia.  He is at home with his wife who is also oxygen dependent.  Patient was brought in due to worsening shortness of breath cough and increasing leg swellings abdominal swelling.  On arrival oxygen sat was in the 70s.  Currently placed on oxygen and is doing fine.  Patient has had full Covid vaccines.  His evaluation showed negative screen for COVID-19 but bilateral chest findings consistent with CHF.  He is also visibly having anasarca.  Patient therefore being admitted with acute exacerbation of CHF.  He has not had an echo in a long time so not sure of his ejection fraction..  ED Course: Temperature is 97.4 blood pressure 142/75 pulse 101 respirate of 27 oxygen sat 77% on room air.  Sodium 134 potassium 4.0 chloride 100 CO2 23 glucose 110 creatinine 1.19 and calcium 8.8.  Total bilirubin is 1.7 BNP 543 lactic acid 1.5.  CBC appear to be within normal.  Urinalysis is negative.  Chest x-ray showed pulmonary vascular congestion with progressive groundglass opacities.  Patient being admitted with acute exacerbation of CHF  Review of Systems: As per HPI otherwise 10 point review of systems negative.    Past Medical History:  Diagnosis Date  . Arthritis    left hip  . BPH (benign prostatic hyperplasia)   . Cancer [facility])    Skin cancers removed  . Carotid artery occlusion   . COPD (chronic  obstructive pulmonary disease) ([place])   . Diabetes mellitus without complication ([place])   . Hyperlipidemia   . Hypertension   . Hypothyroidism   . Macular degeneration   . Peripheral arterial disease ([place]) 2002  . Stroke [facility]) 2005    Past Surgical History:  Procedure Laterality Date  . ABDOMINAL AORTIC ANEURYSM REPAIR  2003   Aorto-left femoral-right iliac BPG by Dr. Scot Dock  . Aortic aneursym surgery     2003- Dr. Scot Dock  . BACK SURGERY     1960  . CAROTID ENDARTERECTOMY  Jan. 25,2007   Right CEA  . Carotid stenosis surgery on right     2007  . CARPAL TUNNEL RELEASE Left 06/02/2016   Procedure: LEFT CARPAL TUNNEL RELEASE;  Surgeon: Daryll Brod, MD;  Location: [place];  Service: Orthopedics;  Laterality: Left;  . EYE SURGERY     Catarct surgery and lens implant - bilateral  . PR VEIN BYPASS GRAFT,AORTO-FEM-POP  2005   Revision Right Fem-pop  . PR VEIN BYPASS GRAFT,AORTO-FEM-POP  2002   Right Fem-pop  . RADIOLOGY WITH ANESTHESIA N/A 12/02/2016   Procedure: MRI LUMBAR SPINE WITHOUT;  Surgeon: Medication Radiologist, MD;  Location: [place];  Service: Radiology;  Laterality: N/A;  . Right leg bypass     2002 -  and amputation of right fifth toe  . SPINE SURGERY  1960   lumbar spine  . [place], 2007   cervical spine  . TOE AMPUTATION  08-2001   right 5th toe amputation  reports that he quit smoking about 16 years ago. His smoking use included cigarettes. He has a 63.00 pack-year smoking history. He has quit using smokeless tobacco. He reports that he does not drink alcohol and does not use drugs.  Allergies  Allergen Reactions  . Iohexol Hives and Other (See Comments)    Went away with benadryl   . Iodine Rash    Family History  Problem Relation Age of Onset  . Stroke Mother   . Hypertension Mother   . Heart disease Father   . Heart attack Father   . Cancer Brother   . Hypertension Brother      Prior to Admission medications     Medication Sig Start Date End Date Taking? Authorizing Provider  acetaminophen (TYLENOL) 325 MG tablet Take 650 mg by mouth every 6 (six) hours as needed for mild pain.    Yes [provider]  albuterol (VENTOLIN HFA) 108 (90 Base) MCG/ACT inhaler Inhale 2 puffs into the lungs every 6 (six) hours as needed for wheezing or shortness of breath. 07/08/20  Yes Wieters, Hallie C, PA-C  clopidogrel (PLAVIX) 75 MG tablet TAKE 1 TABLET (75MG ) BY MOUTH EVERY DAY 12/08/19  Yes Tonia Ghent, MD  clotrimazole-betamethasone (LOTRISONE) cream APPLY TO AFFECTED AREA TWICE A DAY 06/04/20  Yes Tonia Ghent, MD  ezetimibe-simvastatin (VYTORIN) 10-40 MG tablet TAKE 1 TABLET BY MOUTH EVERY DAY 05/19/20  Yes Tonia Ghent, MD  furosemide (LASIX) 20 MG tablet Take 1 tablet (20 mg total) by mouth daily as needed for fluid. 05/23/20  Yes Tonia Ghent, MD  guaiFENesin (MUCINEX) 600 MG 12 hr tablet Take 600 mg by mouth 2 (two) times daily.    Yes [provider]  levothyroxine (SYNTHROID) 25 MCG tablet Take 2 tablets (50 mcg total) by mouth daily before breakfast. 03/29/20  Yes Tonia Ghent, MD  Multiple Vitamins-Minerals (CENTRUM SILVER PO) Take 1 tablet by mouth daily.   Yes [provider]  atenolol-chlorthalidone (TENORETIC) 50-25 MG tablet Held as of 05/23/20 Patient taking differently: Take 0.05 tablets by mouth daily. Held as of 05/23/20 05/23/20   Tonia Ghent, MD    Physical Exam: Vitals:   07/19/20 1830 07/19/20 1900 07/19/20 1915 07/19/20 2100  BP: (!) 147/75 (!) 131/92  (!) 140/95  Pulse: 95 95 93 94  Resp: (!) 21 (!) 27 (!) 23 20  Temp:      TempSrc:      SpO2: 97% 97% 98% 99%  Weight:      Height:          Constitutional: Chronically ill looking, anasarca, hard of hearing Vitals:   07/19/20 1830 07/19/20 1900 07/19/20 1915 07/19/20 2100  BP: (!) 147/75 (!) 131/92  (!) 140/95  Pulse: 95 95 93 94  Resp: (!) 21 (!) 27 (!) 23 20  Temp:      TempSrc:       SpO2: 97% 97% 98% 99%  Weight:      Height:       Eyes: PERRL, lids and conjunctivae normal ENMT: Mucous membranes are moist. Posterior pharynx clear of any exudate or lesions.Normal dentition.  Neck: normal, supple, no masses, no thyromegaly Respiratory: Decreased air entry at the bases mild crackles bilaterally no wheeze. Normal respiratory effort. No accessory muscle use.  Cardiovascular: Sinus tachycardia no murmurs / rubs / gallops.  3+ extremity edema. 2+ pedal pulses. No carotid bruits.  Abdomen: no tenderness, no masses palpated. No hepatosplenomegaly. Bowel sounds  positive.  Musculoskeletal: no clubbing / cyanosis. No joint deformity upper and lower extremities. Good ROM, no contractures. Normal muscle tone.  Skin: no rashes, lesions, ulcers. No induration Neurologic: CN 2-12 grossly intact. Sensation intact, DTR normal. Strength 5/5 in all 4.  Psychiatric: Normal judgment and insight. Alert and oriented x 3. Normal mood.     Labs on Admission: I have personally reviewed following labs and imaging studies  CBC: Recent Labs  Lab 07/19/20 1625  WBC 7.4  NEUTROABS 5.7  HGB 14.9  HCT 43.4  MCV 104.3*  PLT 301   Basic Metabolic Panel: Recent Labs  Lab 07/19/20 1625  NA 134*  K 4.0  CL 100  CO2 23  GLUCOSE 110*  BUN 12  CREATININE 1.19  CALCIUM 8.8*   GFR: Estimated Creatinine Clearance: 34.1 mL/min (by C-G formula based on SCr of 1.19 mg/dL). Liver Function Tests: Recent Labs  Lab 07/19/20 1625  AST 37  ALT 10  ALKPHOS 51  BILITOT 1.7*  PROT 6.0*  ALBUMIN 3.6   No results for input(s): LIPASE, AMYLASE in the last 168 hours. No results for input(s): AMMONIA in the last 168 hours. Coagulation Profile: Recent Labs  Lab 07/19/20 1625  INR 1.1   Cardiac Enzymes: No results for input(s): CKTOTAL, CKMB, CKMBINDEX, TROPONINI in the last 168 hours. BNP (last 3 results) No results for input(s): PROBNP in the last 8760 hours. HbA1C: No results for  input(s): HGBA1C in the last 72 hours. CBG: No results for input(s): GLUCAP in the last 168 hours. Lipid Profile: No results for input(s): CHOL, HDL, LDLCALC, TRIG, CHOLHDL, LDLDIRECT in the last 72 hours. Thyroid Function Tests: No results for input(s): TSH, T4TOTAL, FREET4, T3FREE, THYROIDAB in the last 72 hours. Anemia Panel: No results for input(s): VITAMINB12, FOLATE, FERRITIN, TIBC, IRON, RETICCTPCT in the last 72 hours. Urine analysis:    Component Value Date/Time   COLORURINE YELLOW (A) 07/19/2020 1956   APPEARANCEUR CLEAR (A) 07/19/2020 1956   APPEARANCEUR Clear 11/24/2014 1359   LABSPEC 1.008 07/19/2020 1956   LABSPEC 1.008 11/24/2014 1359   PHURINE 7.0 07/19/2020 1956   GLUCOSEU NEGATIVE 07/19/2020 1956   GLUCOSEU Negative 11/24/2014 1359   HGBUR NEGATIVE 07/19/2020 1956   BILIRUBINUR NEGATIVE 07/19/2020 1956   BILIRUBINUR Negative 11/24/2014 [place] 07/19/2020 1956   PROTEINUR NEGATIVE 07/19/2020 1956   NITRITE NEGATIVE 07/19/2020 1956   LEUKOCYTESUR NEGATIVE 07/19/2020 1956   LEUKOCYTESUR Negative 11/24/2014 1359   Sepsis Labs: @LABRCNTIP (procalcitonin:4,lacticidven:4) ) Recent Results (from the past 240 hour(s))  SARS Coronavirus 2 by RT PCR (hospital order, performed in [facility] hospital lab) Nasopharyngeal Nasopharyngeal Swab     Status: None   Collection Time: 07/19/20  4:55 PM   Specimen: Nasopharyngeal Swab  Result Value Ref Range Status   SARS Coronavirus 2 NEGATIVE NEGATIVE Final    Comment: (NOTE) SARS-CoV-2 target nucleic acids are NOT DETECTED.  The SARS-CoV-2 RNA is generally detectable in upper and lower respiratory specimens during the acute phase of infection. The lowest concentration of SARS-CoV-2 viral copies this assay can detect is 250 copies / mL. A negative result does not preclude SARS-CoV-2 infection and should not be used as the sole basis for treatment or other patient management decisions.  A negative result may  occur with improper specimen collection / handling, submission of specimen other than nasopharyngeal swab, presence of viral mutation(s) within the areas targeted by this assay, and inadequate number of viral copies (<250 copies / mL). A negative  result must be combined with clinical observations, patient history, and epidemiological information.  Fact Sheet for Patients:   StrictlyIdeas.no  Fact Sheet for Healthcare Providers: BankingDealers.co.za  This test is not yet approved or  cleared by the [place] FDA and has been authorized for detection and/or diagnosis of SARS-CoV-2 by FDA under an Emergency Use Authorization (EUA).  This EUA will remain in effect (meaning this test can be used) for the duration of the COVID-19 declaration under Section 564(b)(1) of the Act, 21 U.S.C. section 360bbb-3(b)(1), unless the authorization is terminated or revoked sooner.  Performed at [facility], [address] [place], [place] [address]      Radiological Exams on Admission: DG Chest [facility] 1 View  Result Date: 07/19/2020 CLINICAL DATA:  Shortness of breath and weakness, history of pneumonia status post antibiotic therapy EXAM: PORTABLE CHEST 1 VIEW COMPARISON:  07/08/2020 FINDINGS: Single frontal view of the chest demonstrates a stable cardiac silhouette. There is progressive interstitial and ground-glass opacity at the lung bases, right greater than left. No effusion or pneumothorax. No acute bony abnormalities. IMPRESSION: 1. Progressive bibasilar interstitial and ground-glass opacities, most pronounced at the right lung base. Findings could reflect progressive atypical pneumonia versus edema. Electronically Signed   By: Randa Ngo M.D.   On: 07/19/2020 17:25    EKG: Independently reviewed.  Shows sinus rhythm with left anterior fascicular block.  Assessment/Plan Principal Problem:   CHF exacerbation (HCC) Active  Problems:   Peripheral vascular disease (HCC)   COPD (chronic obstructive pulmonary disease) (HCC)   CAD (coronary artery disease)   Hypothyroidism   Hyperlipemia   HTN (hypertension)   At risk for falls   Dyspnea     #1 acute exacerbation of congestive heart failure: EF is unknown at this point.  Patient has previous echo many years ago.  At this point will admit the patient.  IV diuresis.  Get echocardiogram in the morning and decide on other medications.  In the meantime keep on oxygen and monitor INRs closely.  #2 hypertension: Confirm and resume home regiment.  Monitor closely.  #3 COPD: No acute exacerbation.  Continue home regimen  #4 coronary artery disease: Stable at baseline.  Continue monitoring  #5 peripheral vascular disease: Stable.  Continue Plavix and other home regimen  #6 hypothyroidism: Continue levothyroxine.  #7 fall risk: We will need PT and OT prior to discharge   DVT prophylaxis: Lovenox Code Status: DNR Family Communication: Wife at bedside Disposition Plan: Home Consults called: None Admission status: Inpatient  Severity of Illness: The appropriate patient status for this patient is INPATIENT. Inpatient status is judged to be reasonable and necessary in order to provide the required intensity of service to ensure the patient's safety. The patient's presenting symptoms, physical exam findings, and initial radiographic and laboratory data in the context of their chronic comorbidities is felt to place them at high risk for further clinical deterioration. Furthermore, it is not anticipated that the patient will be medically stable for discharge from the hospital within 2 midnights of admission. The following factors support the patient status of inpatient.   " The patient's presenting symptoms include shortness of breath and cough. " The worrisome physical exam findings include anasarca. " The initial radiographic and laboratory data are worrisome because  of pulmonary infiltrates pulmonary edema. " The chronic co-morbidities include coronary artery disease.   * I certify that at the point of admission it is my clinical judgment that the patient will require inpatient hospital  care spanning beyond 2 midnights from the point of admission due to high intensity of service, high risk for further deterioration and high frequency of surveillance required.Barbette Merino MD [facility] Pager [phone]  If 7PM-7AM, please contact night-coverage [email or web address] Password [facility]  07/19/2020, 11:33 PM

## 2020-07-19 NOTE — ED Provider Notes (Signed)
[name]    CSN: [id number] Arrival date & time: 07/19/20  1318      History   Chief Complaint Chief Complaint  Patient presents with  . Shortness of Breath    HPI Dylan Knight is a 86 y.o. male  Presenting his wife for evaluation of persistent labored breathing and cough.  Patient seen on 8/30 initially: Please see those records, reviewed by me at time of visit.  Patient treated for CAP with amoxicillin, azithromycin: Has completed course.  No fever, thoughts, myalgias, chest pain, palpitations, lower leg swelling, nausea, vomiting, decreased appetite, diarrhea.  Good p.o. intake per wife who corroborates history.  Has been using albuterol inhaler with some relief of dyspnea.  Tried seen PCP for PNA follow-up, though unable to get in "for the next month".  Past Medical History:  Diagnosis Date  . Arthritis    left hip  . BPH (benign prostatic hyperplasia)   . Cancer [facility])    Skin cancers removed  . Carotid artery occlusion   . COPD (chronic obstructive pulmonary disease) ([place])   . Diabetes mellitus without complication ([place])   . Hyperlipidemia   . Hypertension   . Hypothyroidism   . Macular degeneration   . Peripheral arterial disease ([place]) 2002  . Stroke [facility]) 2005    Patient Active Problem List   Diagnosis Date Noted  . Edema 05/26/2020  . ETD (eustachian tube dysfunction) 04/24/2020  . Insomnia 10/15/2019  . Medicare annual wellness visit, subsequent 07/17/2019  . Tinea cruris 02/12/2019  . Urinary retention 02/10/2019  . Dysuria 02/07/2019  . Restlessness 01/23/2019  . At risk for falls 07/21/2018  . Laceration of left hand 11/13/2017  . Gait abnormality 05/19/2017  . Hyperlipemia 03/29/2017  . HTN (hypertension) 03/29/2017  . Advance care planning 02/25/2017  . Macular degeneration 02/25/2017  . Transient speech disturbance   . Bilateral carotid artery stenosis   . History of stroke   . TIA (transient ischemic attack) 01/30/2017  .  Acute ischemic stroke ([place]) 01/30/2017  . Low back pain with right-sided sciatica 12/01/2016  . Diabetes mellitus type 2, controlled ([place]) 12/01/2016  . CAD (coronary artery disease) 12/01/2016  . Hypothyroidism 12/01/2016  . Back pain 11/02/2016  . Aftercare following surgery of the circulatory system, [place] 06/28/2013  . Cough 04/18/2013  . COPD (chronic obstructive pulmonary disease) ([place]) 11/22/2012  . Occlusion and stenosis of carotid artery without mention of cerebral infarction 06/29/2012  . Peripheral vascular disease ([place]) 12/30/2011    Past Surgical History:  Procedure Laterality Date  . ABDOMINAL AORTIC ANEURYSM REPAIR  2003   Aorto-left femoral-right iliac BPG by Dr. Scot Dock  . Aortic aneursym surgery     2003- Dr. Scot Dock  . BACK SURGERY     1960  . CAROTID ENDARTERECTOMY  Jan. 25,2007   Right CEA  . Carotid stenosis surgery on right     2007  . CARPAL TUNNEL RELEASE Left 06/02/2016   Procedure: LEFT CARPAL TUNNEL RELEASE;  Surgeon: Daryll Brod, MD;  Location: [place];  Service: Orthopedics;  Laterality: Left;  . EYE SURGERY     Catarct surgery and lens implant - bilateral  . PR VEIN BYPASS GRAFT,AORTO-FEM-POP  2005   Revision Right Fem-pop  . PR VEIN BYPASS GRAFT,AORTO-FEM-POP  2002   Right Fem-pop  . RADIOLOGY WITH ANESTHESIA N/A 12/02/2016   Procedure: MRI LUMBAR SPINE WITHOUT;  Surgeon: Medication Radiologist, MD;  Location: [place];  Service: Radiology;  Laterality: N/A;  .  Right leg bypass     2002 -  and amputation of right fifth toe  . SPINE SURGERY  1960   lumbar spine  . [place], 2007   cervical spine  . TOE AMPUTATION  08-2001   right 5th toe amputation       Home Medications    Prior to Admission medications   Medication Sig Start Date End Date Taking? Authorizing Provider  acetaminophen (TYLENOL) 325 MG tablet Take 650 mg by mouth every 6 (six) hours as needed for mild pain.     [provider]  albuterol  (VENTOLIN HFA) 108 (90 Base) MCG/ACT inhaler Inhale 2 puffs into the lungs every 6 (six) hours as needed for wheezing or shortness of breath. 07/08/20   Wieters, Hallie C, PA-C  amoxicillin-clavulanate (AUGMENTIN) 875-125 MG tablet Take 1 tablet by mouth every 12 (twelve) hours. 07/08/20   Wieters, Elesa Hacker, PA-C  atenolol-chlorthalidone (TENORETIC) 50-25 MG tablet Held as of 05/23/20 05/23/20   Tonia Ghent, MD  azithromycin (ZITHROMAX) 250 MG tablet Take 1 tablet (250 mg total) by mouth daily. Take first 2 tablets together, then 1 every day until finished. 07/08/20   Wieters, Hallie C, PA-C  blood glucose meter kit and supplies Dispense based on patient and insurance preference. Use to check blood sugar daily as directed. (FOR ICD-10 E10.9, E11.9). 10/27/18   Tonia Ghent, MD  clopidogrel (PLAVIX) 75 MG tablet TAKE 1 TABLET (75MG) BY MOUTH EVERY DAY 12/08/19   Tonia Ghent, MD  clotrimazole-betamethasone (LOTRISONE) cream APPLY TO AFFECTED AREA TWICE A DAY 06/04/20   Tonia Ghent, MD  ezetimibe-simvastatin (VYTORIN) 10-40 MG tablet TAKE 1 TABLET BY MOUTH EVERY DAY 05/19/20   Tonia Ghent, MD  furosemide (LASIX) 20 MG tablet Take 1 tablet (20 mg total) by mouth daily as needed for fluid. 05/23/20   Tonia Ghent, MD  glucose blood (ACCU-CHEK GUIDE) test strip Use as instructed to check blood sugar daily as directed.  Diagnosis E10.9  Non insulin dependent. 01/19/19   Tonia Ghent, MD  guaiFENesin (MUCINEX) 600 MG 12 hr tablet Take 600 mg by mouth 2 (two) times daily.     [provider]  ipratropium-albuterol (DUONEB) 0.5-2.5 (3) MG/3ML SOLN Take 3 mLs by nebulization 3 (three) times daily. As directed 11/17/18   Collene Gobble, MD  JANUMET 50-500 MG tablet TAKE 1 TABLET BY MOUTH EVERY DAY 02/29/20   Tonia Ghent, MD  levothyroxine (SYNTHROID) 25 MCG tablet Take 2 tablets (50 mcg total) by mouth daily before breakfast. 03/29/20   Tonia Ghent, MD  loratadine (CLARITIN)  10 MG tablet Take 10 mg by mouth daily. Reported on 01/03/2016    [provider]  Multiple Vitamins-Minerals (CENTRUM SILVER PO) Take 1 tablet by mouth daily.    [provider]  Multiple Vitamins-Minerals (OCUVITE PRESERVISION PO) Take 1 tablet by mouth 2 (two) times daily.    [provider]  nystatin (MYCOSTATIN/NYSTOP) powder Apply topically 3 (three) times daily. 02/10/19   Tonia Ghent, MD  tamsulosin (FLOMAX) 0.4 MG CAPS capsule TAKE 2 CAPSULES BY MOUTH EVERY DAY 05/05/19   Tonia Ghent, MD  umeclidinium-vilanterol [facility] ELLIPTA) 62.5-25 MCG/INH AEPB Inhale 1 puff into the lungs daily. 11/17/18   Collene Gobble, MD    Family History Family History  Problem Relation Age of Onset  . Stroke Mother   . Hypertension Mother   . Heart disease Father   .  Heart attack Father   . Cancer Brother   . Hypertension Brother     Social History Social History   Tobacco Use  . Smoking status: Former Smoker    Packs/day: 1.00    Years: 63.00    Pack years: 63.00    Types: Cigarettes    Quit date: 04/09/2004    Years since quitting: 16.2  . Smokeless tobacco: Former Systems developer  . Tobacco comment: Quit smoking in 2005  Vaping Use  . Vaping Use: Never used  Substance Use Topics  . Alcohol use: No    Alcohol/week: 0.0 standard drinks  . Drug use: No     Allergies   Iohexol and Iodine   Review of Systems As per HPI   Physical Exam Triage Vital Signs ED Triage Vitals  Enc Vitals Group     BP 07/19/20 1436 130/74     Pulse Rate 07/19/20 1436 97     Resp 07/19/20 1436 (!) 26     Temp 07/19/20 1436 (!) 97.4 F (36.3 C)     Temp Source 07/19/20 1436 Oral     SpO2 07/19/20 1436 (!) 77 %     Weight --      Height --      Head Circumference --      Peak Flow --      Pain Score 07/19/20 1437 0     Pain Loc --      Pain Edu? --      Excl. in [place]? --    No data found.  Updated Vital Signs BP 130/74 (BP Location: Left Arm)   Pulse 97   Temp (!) 97.4  F (36.3 C) (Oral)   Resp (!) 26   SpO2 92%   Visual Acuity Right Eye Distance:   Left Eye Distance:   Bilateral Distance:    Right Eye Near:   Left Eye Near:    Bilateral Near:     Physical Exam Constitutional:      General: He is not in acute distress. HENT:     Head: Normocephalic and atraumatic.     Mouth/Throat:     Mouth: Mucous membranes are moist.     Pharynx: Oropharynx is clear. No pharyngeal swelling or oropharyngeal exudate.  Eyes:     General: No scleral icterus.    Pupils: Pupils are equal, round, and reactive to light.  Neck:     Vascular: No JVD.     Trachea: No tracheal deviation.  Cardiovascular:     Rate and Rhythm: Normal rate and regular rhythm.  Pulmonary:     Effort: Tachypnea and accessory muscle usage present. No respiratory distress.     Breath sounds: No stridor. Rhonchi present. No decreased breath sounds, wheezing or rales.     Comments: Scattered, decreased airflow Chest:     Chest wall: No deformity, tenderness or crepitus.  Musculoskeletal:        General: Normal range of motion.     Right lower leg: No tenderness. No edema.     Left lower leg: No tenderness. No edema.  Lymphadenopathy:     Cervical: No cervical adenopathy.  Skin:    General: Skin is warm.     Capillary Refill: Capillary refill takes 2 to 3 seconds.     Coloration: Skin is not cyanotic, jaundiced or pale.  Neurological:     General: No focal deficit present.     Mental Status: He is alert and oriented to person, place, and  time.  Psychiatric:        Mood and Affect: Mood normal.        Behavior: Behavior normal.      UC Treatments / Results  Labs (all labs ordered are listed, but only abnormal results are displayed) Labs Reviewed - No data to display  EKG   Radiology No results found.  Procedures Procedures (including critical care time)  Medications Ordered in UC Medications - No data to display  Initial Impression / Assessment and Plan / UC  Course  I have reviewed the triage vital signs and the nursing notes.  Pertinent labs & imaging results that were available during my care of the patient were reviewed by me and considered in my medical decision making (see chart for details).     Patient afebrile, nontoxic in office today.  Initially significantly hypoxic, though resolved with rest.  O2 89-91% on RA, 94% on 2L [place].  Covid negative at last visit.  Patient does have history of pneumonia: Completed antibiotic course.  Patient has desaturations to 77% with minimal exertion.  Given age, comorbidities, desaturations, recent treatment for CAP, recommended patient go to ER for further evaluation/management.  Transported via EMS in stable condition. Final Clinical Impressions(s) / UC Diagnoses   Final diagnoses:  SOB (shortness of breath)  Tachypnea  Hypoxia  History of recent pneumonia   Discharge Instructions   None    ED Prescriptions    None     PDMP not reviewed this encounter.   Hall[place], [place], [place] 07/19/20 1502

## 2020-07-20 ENCOUNTER — Inpatient Hospital Stay
Admit: 2020-07-20 | Discharge: 2020-07-20 | Disposition: A | Discharge status: Home / Self Care | Payer: Medicare PPO | Attending: Internal Medicine | Admitting: Internal Medicine

## 2020-07-20 DIAGNOSIS — J9601 Acute respiratory failure with hypoxia: Secondary | ICD-10-CM | POA: Insufficient documentation

## 2020-07-20 LAB — ECHOCARDIOGRAM COMPLETE
Height: 66 in
S' Lateral: 2.23 cm
Weight: 2240 oz

## 2020-07-20 LAB — BASIC METABOLIC PANEL
Anion gap: 11 (ref 5–15)
BUN: 12 mg/dL (ref 8–23)
CO2: 25 mmol/L (ref 22–32)
Calcium: 9.1 mg/dL (ref 8.9–10.3)
Chloride: 101 mmol/L (ref 98–111)
Creatinine, Ser: 1.24 mg/dL (ref 0.61–1.24)
GFR calc Af Amer: 57 mL/min — ABNORMAL LOW (ref >60)
GFR calc non Af Amer: 49 mL/min — ABNORMAL LOW (ref >60)
Glucose, Bld: 136 mg/dL — ABNORMAL HIGH (ref 70–99)
Potassium: 3.2 mmol/L — ABNORMAL LOW (ref 3.5–5.1)
Sodium: 137 mmol/L (ref 135–145)

## 2020-07-20 LAB — TSH: TSH: 6.653 u[IU]/mL — ABNORMAL HIGH (ref 0.350–4.500)

## 2020-07-20 LAB — MAGNESIUM: Magnesium: 2.1 mg/dL (ref 1.7–2.4)

## 2020-07-20 LAB — T4, FREE: Free T4: 1.09 ng/dL (ref 0.61–1.12)

## 2020-07-20 MED ORDER — HYDRALAZINE HCL 20 MG/ML IJ SOLN: 10.0000 mg | Freq: Four times a day (QID) | INTRAMUSCULAR | Status: DC | PRN

## 2020-07-20 MED ORDER — ATENOLOL 50 MG PO TABS
50.0000 mg | ORAL_TABLET | Freq: Every day | ORAL | Status: DC
  Administered 2020-07-21 – 2020-07-24 (×4): 50 mg via ORAL
  Filled 2020-07-20 (×4): qty 1

## 2020-07-20 MED ORDER — METHOCARBAMOL 500 MG PO TABS
500.0000 mg | ORAL_TABLET | Freq: Once | ORAL | Status: AC
  Administered 2020-07-20: 500 mg via ORAL
  Filled 2020-07-20 (×2): qty 1

## 2020-07-20 MED ORDER — EZETIMIBE-SIMVASTATIN 10-40 MG PO TABS: 1.0000 | ORAL_TABLET | Freq: Every day | ORAL | Status: DC

## 2020-07-20 MED ORDER — METHOCARBAMOL 500 MG PO TABS
500.0000 mg | ORAL_TABLET | Freq: Once | ORAL | Status: AC
  Administered 2020-07-20: 500 mg via ORAL
  Filled 2020-07-20: qty 1

## 2020-07-20 MED ORDER — TROLAMINE SALICYLATE 10 % EX CREA
TOPICAL_CREAM | CUTANEOUS | Status: DC | PRN
  Filled 2020-07-20: qty 85

## 2020-07-20 NOTE — Progress Notes (Signed)
*  PRELIMINARY RESULTS* Echocardiogram 2D Echocardiogram has been performed.  Dylan Knight S Ranessa Kosta 07/20/2020, 10:33 AM 

## 2020-07-20 NOTE — ED Notes (Signed)
Patient called out. RN to bedside. Patient's urine had leaked out of the external catheter onto the bed. Patient cleaned and changed into clean gown. Incontinence pads and blankets replaced.

## 2020-07-20 NOTE — ED Notes (Signed)
Pt complains of low leg cramps/spasms. Says they hurt very badly. Tech rubbed some aspercream on his legs.    Pt also had urine output of 1200cc urine in canister.  Tech emptied.   lw edt

## 2020-07-20 NOTE — ED Notes (Signed)
Patient called out. RN to bedside. Patient requesting help adjusting blankets. Blankets adjusted. Patient repositioned in bed.   Patient had spilled water on hospital gown. Patient placed in clean gown.

## 2020-07-20 NOTE — ED Notes (Signed)
Patient called out. RN to bedside. Patient c/o leg cramping/spasming. Admitting provider messaged; awaiting response.

## 2020-07-20 NOTE — Plan of Care (Signed)
Continuing with plan of care. 

## 2020-07-20 NOTE — TOC Initial Note (Signed)
Transition of Care [facility]) - Initial/Assessment Note    Patient Details  Name: Dylan Knight MRN: [id number] Date of Birth: Mar 18, 1924  [facility] CM/SW Contact:    Maebelle Munroe, RN Phone Number: 07/20/2020, 11:51 AM  Clinical Narrative:                 [facility] team spoke to patient. Assessment completed. Wife- [name] at the bedside offers history along with patient. Wife voices pt is here for pneumonia. Pt. Is alert, oriented and verbally responsive. Discussed DCP for SNF. Patient shares he wants to go to [facility] as 1st choice. He voices he had a good experience in the past.  Awaiting PT evaluation and recommendation to continue with DCP.  Expected Discharge Plan: Skilled Nursing Facility Barriers to Discharge: No Barriers Identified   Patient Goals and CMS Choice Patient states their goals for this hospitalization and ongoing recovery are:: Get better to return home. CMS Medicare.gov Compare Post Acute Care list provided to:: Patient Represenative (must comment) (Wife- [name]) Choice offered to / list presented to : Patient, Spouse (Spouse shares 1st choice is Radiation protection practitioner.)  Expected Discharge Plan and Services Expected Discharge Plan: [place]   Discharge Planning Services: CM Consult   Living arrangements for the past 2 months: Apartment (Handicap accessible.)                                      Prior Living Arrangements/Services Living arrangements for the past 2 months: Apartment (Handicap accessible.) Lives with:: Self, Spouse Patient language and need for interpreter reviewed:: Yes Do you feel safe going back to the place where you live?: Yes      Need for Family Participation in Patient Care: No (Comment) Care giver support system in place?: Yes (comment) (Wife- [name])   Criminal Activity/Legal Involvement Pertinent to Current Situation/Hospitalization: No - Comment as needed  Activities of Daily Living       Permission Sought/Granted Permission sought to share information with : Case Manager Henley, [place] (Spouse) [phone] (Mobile)) Permission granted to share information with : Yes, Verbal Permission Granted Mikolaj, [place] (Spouse) [phone] (Mobile))              Emotional Assessment Appearance:: Appears stated age, Well-Groomed Attitude/Demeanor/Rapport: Gracious, Engaged Affect (typically observed): Appropriate, Pleasant Orientation: : Oriented to Self, Oriented to Place, Oriented to  Time, Oriented to Situation   Psych Involvement: No (comment)  Admission diagnosis:  Dyspnea [R06.00] Patient Active Problem List   Diagnosis Date Noted  . Dyspnea 07/19/2020  . CHF exacerbation ([place]) 07/19/2020  . Edema 05/26/2020  . ETD (eustachian tube dysfunction) 04/24/2020  . Insomnia 10/15/2019  . Medicare annual wellness visit, subsequent 07/17/2019  . Tinea cruris 02/12/2019  . Urinary retention 02/10/2019  . Dysuria 02/07/2019  . Restlessness 01/23/2019  . At risk for falls 07/21/2018  . Laceration of left hand 11/13/2017  . Gait abnormality 05/19/2017  . Hyperlipemia 03/29/2017  . HTN (hypertension) 03/29/2017  . Advance care planning 02/25/2017  . Macular degeneration 02/25/2017  . Transient speech disturbance   . Bilateral carotid artery stenosis   . History of stroke   . TIA (transient ischemic attack) 01/30/2017  . Acute ischemic stroke ([place]) 01/30/2017  . Low back pain with right-sided sciatica 12/01/2016  . Diabetes mellitus type 2, controlled ([place]) 12/01/2016  . CAD (coronary artery disease) 12/01/2016  . Hypothyroidism 12/01/2016  . Back  pain 11/02/2016  . Aftercare following surgery of the circulatory system, [place] 06/28/2013  . Cough 04/18/2013  . COPD (chronic obstructive pulmonary disease) ([place]) 11/22/2012  . Occlusion and stenosis of carotid artery without mention of cerebral infarction 06/29/2012  . Peripheral vascular disease ([place]) 12/30/2011   PCP:   Tonia Ghent, MD Pharmacy:   [id number] [place] [address] [place] [address] Phone: [phone] Fax: [phone]     Social Determinants of Health (SDOH) Interventions    Readmission Risk Interventions No flowsheet data found.

## 2020-07-20 NOTE — ED Notes (Signed)
Per patient's wife request, informed patient that his wife made it home safely.

## 2020-07-20 NOTE — Care Management (Signed)
TOC team- consult received. Assessment in progress.

## 2020-07-20 NOTE — ED Notes (Signed)
Pt ambulated with a stand and pivot with assistance from tech to bedside toliet. Pt had med/large soft solid bowel movement.  Pt has 1200 cc urine output before bowel movement. Canister dumped prior to movement.   Pt back in bed and comfortable. Call light within reach.   lw edt

## 2020-07-20 NOTE — ED Notes (Signed)
Patient called out. External foley cath leaked. Catheter replaced. Incontinence pad replaced. Patient placed in new gown and repositioned.

## 2020-07-20 NOTE — Progress Notes (Signed)
[facility] [place] NAME: Dylan Knight    MR#:  [id number]  DATE OF BIRTH:  1924/08/13  SUBJECTIVE:   Patient came in with increasing shortness of breath and leg swelling. Recently about 10 days ago he finished to 39 course of antibiotic treatment for respiratory infection according to the wife.\Patient is a bit hard on hearing although he feels little better. His diuresis well with IV Lasix. Denies chest pain. REVIEW OF SYSTEMS:   Review of Systems  Constitutional: Negative for chills, fever and weight loss.  HENT: Negative for ear discharge, ear pain and nosebleeds.   Eyes: Negative for blurred vision, pain and discharge.  Respiratory: Positive for shortness of breath. Negative for sputum production, wheezing and stridor.   Cardiovascular: Positive for orthopnea and leg swelling. Negative for chest pain, palpitations and PND.  Gastrointestinal: Negative for abdominal pain, diarrhea, nausea and vomiting.  Genitourinary: Negative for frequency and urgency.  Musculoskeletal: Negative for back pain and joint pain.  Neurological: Positive for weakness. Negative for sensory change, speech change and focal weakness.  Psychiatric/Behavioral: Negative for depression and hallucinations. The patient is not nervous/anxious.    Tolerating Diet:yes Tolerating PT: pending  DRUG ALLERGIES:   Allergies  Allergen Reactions  . Iohexol Hives and Other (See Comments)    Went away with benadryl   . Iodine Rash    VITALS:  Blood pressure 140/80, pulse 88, temperature 97.7 F (36.5 C), temperature source Oral, resp. rate (!) 24, height 5\' 6"  (1.676 m), weight 63.5 kg, SpO2 98 %.  PHYSICAL EXAMINATION:   Physical Exam  GENERAL:  86 y.o.-year-old patient lying in the bed with no acute distress. Thin   HEENT: Head atraumatic, normocephalic. Oropharynx and nasopharynx clear.  LUNGS: decreased breath sounds bilaterally, no wheezing, rales, rhonchi. No  use of accessory muscles of respiration.  CARDIOVASCULAR: S1, S2 normal. No murmurs, rubs, or gallops.  ABDOMEN: Soft, nontender, nondistended. Bowel sounds present. No organomegaly or mass.  EXTREMITIES: ++ edema b/l.    NEUROLOGIC: Cranial nerves II through XII are intact. No focal Motor or sensory deficits b/l.  weak PSYCHIATRIC:  patient is alert and oriented x 3.  SKIN: No obvious rash, lesion, or ulcer.   LABORATORY PANEL:  CBC Recent Labs  Lab 07/19/20 1625  WBC 7.4  HGB 14.9  HCT 43.4  PLT 192    Chemistries  Recent Labs  Lab 07/19/20 1625 07/19/20 1625 07/20/20 0312  NA 134*   < > 137  K 4.0   < > 3.2*  CL 100   < > 101  CO2 23   < > 25  GLUCOSE 110*   < > 136*  BUN 12   < > 12  CREATININE 1.19   < > 1.24  CALCIUM 8.8*   < > 9.1  MG  --   --  2.1  AST 37  --   --   ALT 10  --   --   ALKPHOS 51  --   --   BILITOT 1.7*  --   --    < > = values in this interval not displayed.   Cardiac Enzymes No results for input(s): TROPONINI in the last 168 hours. RADIOLOGY:  DG Chest Port 1 View  Result Date: 07/19/2020 CLINICAL DATA:  Shortness of breath and weakness, history of pneumonia status post antibiotic therapy EXAM: PORTABLE CHEST 1 VIEW COMPARISON:  07/08/2020 FINDINGS: Single frontal view of the chest demonstrates  a stable cardiac silhouette. There is progressive interstitial and ground-glass opacity at the lung bases, right greater than left. No effusion or pneumothorax. No acute bony abnormalities. IMPRESSION: 1. Progressive bibasilar interstitial and ground-glass opacities, most pronounced at the right lung base. Findings could reflect progressive atypical pneumonia versus edema. Electronically Signed   By: Randa Ngo M.D.   On: 07/19/2020 17:25   ASSESSMENT AND PLAN:  Dylan Knight is a 86 y.o. male with medical history significant of coronary artery disease, osteoarthritis, BPH, carotid artery disease, diabetes, hypertension, hyperlipidemia,  hypothyroidism and peripheral vascular disease, history of CVA who has not seen a cardiologist apparently for over 10 years.  He was recently treated for community-acquired pneumonia.  He is at home with his wife who is also oxygen dependent.  Patient was brought in due to worsening shortness of breath cough and increasing leg swellings abdominal swelling  #1 acute hypoxic respiratory failure secondary to Acute exacerbation of congestive heart failure, diastolic  -EF in 1157 echo was 60 to 65%  -echo ordered   -IV Lasix -monitor input output, daily weight and metabolic panel -assess for home oxygen use  #2 hypertension:  -resume atenolol -consider adding ACE inhibitor if blood pressure remains high  #3 COPD: No acute exacerbation.  Continue home regimen  #4 coronary artery disease: Stable at baseline.   -continue Plavix  #5 peripheral vascular disease: Stable.  Continue Plavix  vytorin #6 hypothyroidism: Continue levothyroxine.  #7 fall risk:  -PT OT for discharge  DVT prophylaxis: Lovenox Code Status: DNR prior to arrival Family Communication: Wife at bedside Disposition Plan: Home Consults called: None Admission status: Inpatient  Status is: Inpatient  Remains inpatient appropriate because:Inpatient level of care appropriate due to severity of illness   Dispo: The patient is from: Home              Anticipated d/c is to: TBD              Anticipated d/c date is: 2 days              Patient currently is not medically stable to d/c. being admitted for congestive heart failure. IV diuresis needed for couple days. PT OT evaluation. Patient will benefit from follow-up with heart failure clinic. TOC for discharge planning       TOTAL TIME TAKING CARE OF THIS PATIENT: 25 minutes.  >50% time spent on counselling and coordination of care  Note: This dictation was prepared with Dragon dictation along with smaller phrase technology. Any transcriptional errors that  result from this process are unintentional.  Fritzi Mandes M.D    [facility]   CC: Primary care physician; Tonia Ghent, MDPatient ID: Dylan Knight, male   DOB: January 10, 1924, 86 y.o.   MRN: [id number]

## 2020-07-20 NOTE — ED Notes (Signed)
Patient called out. RN to bedside. Patient c/o increased leg pain/spasm. Aspercream applied.

## 2020-07-20 NOTE — ED Notes (Signed)
Pt given breakfast tray and is calm and stable upon awakening.   lw edt

## 2020-07-20 NOTE — ED Notes (Signed)
Patient c/o headache

## 2020-07-21 ENCOUNTER — Inpatient Hospital Stay: Payer: Medicare PPO

## 2020-07-21 LAB — BASIC METABOLIC PANEL
Anion gap: 12 (ref 5–15)
BUN: 13 mg/dL (ref 8–23)
CO2: 28 mmol/L (ref 22–32)
Calcium: 9.3 mg/dL (ref 8.9–10.3)
Chloride: 97 mmol/L — ABNORMAL LOW (ref 98–111)
Creatinine, Ser: 1.13 mg/dL (ref 0.61–1.24)
GFR calc Af Amer: >60 mL/min (ref >60)
GFR calc non Af Amer: 55 mL/min — ABNORMAL LOW (ref >60)
Glucose, Bld: 138 mg/dL — ABNORMAL HIGH (ref 70–99)
Potassium: 3.2 mmol/L — ABNORMAL LOW (ref 3.5–5.1)
Sodium: 137 mmol/L (ref 135–145)

## 2020-07-21 LAB — URINE CULTURE: Culture: <10000 — AB

## 2020-07-21 MED ORDER — POTASSIUM CHLORIDE CRYS ER 20 MEQ PO TBCR
40.0000 meq | EXTENDED_RELEASE_TABLET | ORAL | Status: AC
  Administered 2020-07-21 (×2): 40 meq via ORAL
  Filled 2020-07-21 (×2): qty 2

## 2020-07-21 NOTE — Plan of Care (Signed)
Continuing with plan of care. 

## 2020-07-21 NOTE — Progress Notes (Signed)
PROGRESS NOTE    Dylan Knight  BHA:[id number]  DOB: November 05, 1924  DOA: 07/19/2020 PCP: Tonia Ghent, MD Outpatient Specialists:   Hospital course:  86 year old man with DM 2, HTN, CAD, CVD and PVD was admitted 07/19/2020 with increasing shortness of breath.  Of note he had recently been treated for CAP as an outpatient with some interval improvement in symptoms until he developed markedly worsening shortness of breath and lower extremity edema.  Chest x-ray revealed "progressive bibasilar interstitial and groundglass opacities suggestive of atypical pneumonia versus edema".  BNP was 540. Patient has been treated with Lasix 40 twice daily with excellent diuresis.  Echocardiogram revealed preserved EF with mild concentric LVH.  Subjective:  Patient thinks he feels better.  He notes no shortness of breath at all at rest however admits that he has not been up walking at all.  Notes that he uses a walker at home due to stroke however does not usually have shortness of breath with ambulation except in the past couple of weeks.  Patient notes he is diuresed significantly and is hoping that he does not have to continue to urinate quite so much.   Objective: Vitals:   07/20/20 2023 07/21/20 0021 07/21/20 0518 07/21/20 1212  BP: 123/89 128/72 129/81 110/65  Pulse: (!) 105 95 84 77  Resp: 18 15 18 20   Temp: 97.8 F (36.6 C)  (!) 97.5 F (36.4 C) 97.7 F (36.5 C)  TempSrc: Oral  Oral   SpO2: 95% 98% 98% 95%  Weight:      Height:        Intake/Output Summary (Last 24 hours) at 07/21/2020 1612 Last data filed at 07/21/2020 1600 Gross per 24 hour  Intake --  Output 1400 ml  Net -1400 ml   Filed Weights   07/19/20 1618  Weight: 63.5 kg     Exam:  General: Thin spry man looking much younger than stated age sitting on side of bed eating with his spry and attentive wife at bedside chatting with him. He has his oxygen cannula pulled up to his forehead because he is eating. Patient  able to speak in full sentences without difficulty.  Eyes: sclera anicteric, conjuctiva mild injection bilaterally CVS: S1-S2, regular  Respiratory: Decreased air entry bilaterally but otherwise clear to auscultation without any adventitious sounds.  I do not hear any rales or rhonchi or wheezes. GI: NABS, soft, NT  LE: No edema.  Neuro: A/O x 3, Moving all extremities equally with normal strength, CN 3-12 intact, grossly nonfocal.  Psych: patient is logical and coherent, judgement and insight appear normal, mood and affect appropriate to situation.   Assessment & Plan:   86 year old man with significant vasculopathy is admitted with increasing shortness of breath and lower extremity edema shortly after being treated as an outpatient for community-acquired pneumonia.  He has been treated with diuresis.  Acute hypoxic respiratory failure Attributed to acute decompensation of diastolic heart failure. Repeat echo here shows preserved EF with mild concentric LVH. IV Lasix 40 twice daily has produced significant diuresis Patient will symptomatically improved and lungs are clear to my exam However attempts to walk patient resulted in significant desaturation to the mid 80s. Will repeat chest x-ray and have low threshold for chest CT if chest x-ray is unrevealing At this point will continue Lasix 40 twice daily, if chest x-ray shows no improvement despite diuresis will decrease diuresis and consider other etiologies of the apparent groundglass opacities.  Hypokalemia We will replete  and recheck in the morning  HTN Continue atenolol  CAD No evidence for acute ischemic event, patient had no chest pain and EKG was within normal limits on admission.  No troponins were sent, BNP was modestly elevated at 550. Continue Plavix  PVD Continue Plavix  Hypothyroidism Continue Synthroid   DVT prophylaxis: Lovenox Code Status: DNR Family Communication: Patient's wife is at bedside  throughout Disposition Plan:   Patient is from: Home  Anticipated Discharge Location: Home versus rehab  Barriers to Discharge: Ongoing desaturation with exertion  Is patient medically stable for Discharge: No   Consultants:  None  Procedures:  None  Antimicrobials:  None   Data Reviewed:  Basic Metabolic Panel: Recent Labs  Lab 07/19/20 1625 07/20/20 0312 07/21/20 0454  NA 134* 137 137  K 4.0 3.2* 3.2*  CL 100 101 97*  CO2 23 25 28   GLUCOSE 110* 136* 138*  BUN 12 12 13   CREATININE 1.19 1.24 1.13  CALCIUM 8.8* 9.1 9.3  MG  --  2.1  --    Liver Function Tests: Recent Labs  Lab 07/19/20 1625  AST 37  ALT 10  ALKPHOS 51  BILITOT 1.7*  PROT 6.0*  ALBUMIN 3.6   No results for input(s): LIPASE, AMYLASE in the last 168 hours. No results for input(s): AMMONIA in the last 168 hours. CBC: Recent Labs  Lab 07/19/20 1625  WBC 7.4  NEUTROABS 5.7  HGB 14.9  HCT 43.4  MCV 104.3*  PLT 192   Cardiac Enzymes: No results for input(s): CKTOTAL, CKMB, CKMBINDEX, TROPONINI in the last 168 hours. BNP (last 3 results) No results for input(s): PROBNP in the last 8760 hours. CBG: No results for input(s): GLUCAP in the last 168 hours.  Recent Results (from the past 240 hour(s))  Blood culture (routine single)     Status: None (Preliminary result)   Collection Time: 07/19/20  4:25 PM   Specimen: BLOOD  Result Value Ref Range Status   Specimen Description BLOOD RFA  Final   Special Requests   Final    BOTTLES DRAWN AEROBIC AND ANAEROBIC Blood Culture adequate volume   Culture   Final    NO GROWTH 2 DAYS Performed at [facility], [address], [place], [place] [address]    Report Status PENDING  Incomplete  Blood culture (single)     Status: None (Preliminary result)   Collection Time: 07/19/20  4:25 PM   Specimen: BLOOD  Result Value Ref Range Status   Specimen Description BLOOD LFA  Final   Special Requests   Final    BOTTLES DRAWN AEROBIC AND  ANAEROBIC Blood Culture adequate volume   Culture   Final    NO GROWTH 2 DAYS Performed at [facility], [address], [place], [place] [address]    Report Status PENDING  Incomplete  SARS Coronavirus 2 by RT PCR (hospital order, performed in [place] hospital lab) Nasopharyngeal Nasopharyngeal Swab     Status: None   Collection Time: 07/19/20  4:55 PM   Specimen: Nasopharyngeal Swab  Result Value Ref Range Status   SARS Coronavirus 2 NEGATIVE NEGATIVE Final    Comment: (NOTE) SARS-CoV-2 target nucleic acids are NOT DETECTED.  The SARS-CoV-2 RNA is generally detectable in upper and lower respiratory specimens during the acute phase of infection. The lowest concentration of SARS-CoV-2 viral copies this assay can detect is 250 copies / mL. A negative result does not preclude SARS-CoV-2 infection and should not be used as  the sole basis for treatment or other patient management decisions.  A negative result may occur with improper specimen collection / handling, submission of specimen other than nasopharyngeal swab, presence of viral mutation(s) within the areas targeted by this assay, and inadequate number of viral copies (<250 copies / mL). A negative result must be combined with clinical observations, patient history, and epidemiological information.  Fact Sheet for Patients:   StrictlyIdeas.no  Fact Sheet for Healthcare Providers: BankingDealers.co.za  This test is not yet approved or  cleared by the [place] FDA and has been authorized for detection and/or diagnosis of SARS-CoV-2 by FDA under an Emergency Use Authorization (EUA).  This EUA will remain in effect (meaning this test can be used) for the duration of the COVID-19 declaration under Section 564(b)(1) of the Act, 21 U.S.C. section 360bbb-3(b)(1), unless the authorization is terminated or revoked sooner.  Performed at [facility], [address], [place], [place] [address]   Urine culture     Status: Abnormal   Collection Time: 07/19/20  7:56 PM   Specimen: Urine, Random  Result Value Ref Range Status   Specimen Description   Final    URINE, RANDOM Performed at [facility], [address] [place], [place] [address]    Special Requests   Final    NONE Performed at [facility], [address] [place], [place] [address]    Culture (A)  Final    <10,000 COLONIES/mL INSIGNIFICANT GROWTH Performed at [facility], [place] [address] [place], [place] [address]    Report Status 07/21/2020 FINAL  Final      Studies: DG Chest Port 1 View  Result Date: 07/19/2020 CLINICAL DATA:  Shortness of breath and weakness, history of pneumonia status post antibiotic therapy EXAM: PORTABLE CHEST 1 VIEW COMPARISON:  07/08/2020 FINDINGS: Single frontal view of the chest demonstrates a stable cardiac silhouette. There is progressive interstitial and ground-glass opacity at the lung bases, right greater than left. No effusion or pneumothorax. No acute bony abnormalities. IMPRESSION: 1. Progressive bibasilar interstitial and ground-glass opacities, most pronounced at the right lung base. Findings could reflect progressive atypical pneumonia versus edema. Electronically Signed   By: Randa Ngo M.D.   On: 07/19/2020 17:25   ECHOCARDIOGRAM COMPLETE  Result Date: 07/20/2020    ECHOCARDIOGRAM REPORT   Patient Name:   Dylan Knight Date of Exam: 07/20/2020 Medical Rec #:  [id number]       Height:       66.0 in Accession #:    [id number]      Weight:       140.0 lb Date of Birth:  11/11/1923       BSA:          1.719 m Patient Age:    71 years        BP:           140/80 mmHg Patient Gender: M               HR:           82 bpm. Exam Location:  [facility] Procedure: 2D Echo Indications:     CHF 428.21/ I50.21  History:         Patient has prior history of Echocardiogram examinations, most                  recent 01/31/2017.   Sonographer:     Talmadge Chad RN Referring Phys:  [phone] [facility]  L GARBA Diagnosing Phys: Neoma Laming MD  Sonographer Comments: Technically difficult study due to poor echo windows, suboptimal apical window and suboptimal parasternal window. IMPRESSIONS  1. Left ventricular ejection fraction, by estimation, is 60 to 65%. The left ventricle has normal function. The left ventricle has no regional wall motion abnormalities. There is mild concentric left ventricular hypertrophy. Left ventricular diastolic parameters are consistent with Grade I diastolic dysfunction (impaired relaxation).  2. Right ventricular systolic function is normal. The right ventricular size is normal.  3. Left atrial size was mildly dilated.  4. The mitral valve is normal in structure. Trivial mitral valve regurgitation. No evidence of mitral stenosis. Severe mitral annular calcification.  5. Tricuspid valve regurgitation is mild to moderate.  6. The aortic valve is calcified. Aortic valve regurgitation is not visualized. Mild aortic valve sclerosis is present, with no evidence of aortic valve stenosis.  7. The inferior vena cava is normal in size with greater than 50% respiratory variability, suggesting right atrial pressure of 3 mmHg. FINDINGS  Left Ventricle: Left ventricular ejection fraction, by estimation, is 60 to 65%. The left ventricle has normal function. The left ventricle has no regional wall motion abnormalities. The left ventricular internal cavity size was normal in size. There is  mild concentric left ventricular hypertrophy. Left ventricular diastolic parameters are consistent with Grade I diastolic dysfunction (impaired relaxation). Right Ventricle: The right ventricular size is normal. No increase in right ventricular wall thickness. Right ventricular systolic function is normal. Left Atrium: Left atrial size was mildly dilated. Right Atrium: Right atrial size was normal in size. Pericardium: There is no evidence of  pericardial effusion. Mitral Valve: The mitral valve is normal in structure. Severe mitral annular calcification. Trivial mitral valve regurgitation. No evidence of mitral valve stenosis. Tricuspid Valve: The tricuspid valve is normal in structure. Tricuspid valve regurgitation is mild to moderate. No evidence of tricuspid stenosis. Aortic Valve: The aortic valve is calcified. Aortic valve regurgitation is not visualized. Mild aortic valve sclerosis is present, with no evidence of aortic valve stenosis. Pulmonic Valve: The pulmonic valve was normal in structure. Pulmonic valve regurgitation is mild. No evidence of pulmonic stenosis. Aorta: The aortic root is normal in size and structure. Venous: The inferior vena cava is normal in size with greater than 50% respiratory variability, suggesting right atrial pressure of 3 mmHg. IAS/Shunts: No atrial level shunt detected by color flow Doppler.  LEFT VENTRICLE PLAX 2D LVIDd:         3.33 cm  Diastology LVIDs:         2.23 cm  LV e' lateral: 15.90 cm/s LV PW:         1.30 cm LV IVS:        1.13 cm LVOT diam:     1.80 cm LVOT Area:     2.54 cm  RIGHT VENTRICLE RV Basal diam:  4.17 cm LEFT ATRIUM         Index LA diam:    2.30 cm 1.34 cm/m   AORTA Ao Root diam: 3.20 cm TRICUSPID VALVE TV Peak grad:   38.3 mmHg TV Vmax:        3.09 m/s  SHUNTS Systemic Diam: 1.80 cm Neoma Laming MD Electronically signed by Neoma Laming MD Signature Date/Time: 07/20/2020/7:28:51 PM    Final      Scheduled Meds: . atenolol  50 mg Oral Daily  . clopidogrel  75 mg Oral Daily  . clotrimazole   Topical BID  . enoxaparin (LOVENOX)  injection  40 mg Subcutaneous Q24H  . ezetimibe  10 mg Oral Daily   And  . simvastatin  40 mg Oral q1800  . furosemide  40 mg Intravenous BID  . levothyroxine  50 mcg Oral QAC breakfast  . multivitamin with minerals  1 tablet Oral Daily  . sodium chloride flush  3 mL Intravenous Q12H   Continuous Infusions: . sodium chloride      Principal Problem:    CHF exacerbation (HCC) Active Problems:   Peripheral vascular disease (HCC)   COPD (chronic obstructive pulmonary disease) (HCC)   CAD (coronary artery disease)   Hypothyroidism   Hyperlipemia   HTN (hypertension)   At risk for falls   Dyspnea     Caly Pellum Derek Jack, [facility]  If 7PM-7AM, please contact night-coverage [email or web address] Password [email or web address] 07/21/2020, 4:12 PM    LOS: 2 days

## 2020-07-21 NOTE — TOC Progression Note (Signed)
Transition of Care [facility]) - Progression Note    Patient Details  Name: ZAYLEN SUSMAN MRN: [id number] Date of Birth: 06-Nov-1924  Transition of Care [facility]) CM/SW [place], LCSW Phone Number: 07/21/2020, 5:31 PM  Clinical Narrative:    Patient SNF work-up complete, PASRR#, and sent out for offers. Still will need PT/OT evals before we can submit for Auth.   Expected Discharge Plan: [place] Barriers to Discharge: No Barriers Identified  Expected Discharge Plan and Services Expected Discharge Plan: [place]   Discharge Planning Services: CM Consult   Living arrangements for the past 2 months: Apartment (Handicap accessible.)                                       Social Determinants of Health (SDOH) Interventions    Readmission Risk Interventions No flowsheet data found.

## 2020-07-21 NOTE — Progress Notes (Signed)
Mobility Specialist - Progress Note   07/21/20 1622  Mobility  Activity Transferred:  Bed to chair  Level of Assistance Moderate assist, patient does 50-74%  Assistive Device None  Distance Ambulated (ft) 3 ft  Mobility Response Tolerated well  Mobility performed by Mobility specialist  $Mobility charge 1 Mobility    Pre-mobility: 77 HR, 102/76 BP, 95% SpO2 During mobility: 83 HR, 88% SpO2 Post-mobility: 78 HR, 125/59 BP, 97% SpO2   Pt was lying in bed utilizing 3L [place] O2 upon arrival. Pt agreed to session. Pt c/o back pain being a "7/10" but states that "this is something he's been dealing with for awhile now." Pt requested to sit up in recliner for a bit. Pt was SBA getting EOB and modA in sit-to-stand. Pt stated that he normally uses a RW at home. However, no AD was used this session d/t not being readily available in room. Pt was modA ambulating to recliner, using mobility tech as a guide. Once seated in recliner, pt's O2 desat to 88%. Mobility tech went over PLB exercises with pt, but d/t prolonged wait time for oxygen levels to increase, [place] O2 was increased to 4L. After waiting 2 mins, pt's O2 levels sat to mid-high 90s and [place] was decreased back to 3L. Overall, pt tolerated session well. Pt was left in recliner with all needs in reach and alarm set. Nurse entered room at the end of session and was notified of performance.    Kathee Delton Mobility Specialist 07/21/20, 4:36 PM

## 2020-07-21 NOTE — NC FL2 (Signed)
[place] LEVEL OF CARE SCREENING TOOL     IDENTIFICATION  Patient Name: Dylan Knight Birthdate: [birth year] Sex: male Admission Date (Current Location): 07/19/2020  [place] and [place] Number:  Engineering geologist and Address:  [facility], [address] [place], [place] [address]      Provider Number:    Attending Physician Name and Address:  Oren Binet*  Relative Name and Phone Number:  Nancy-wife [phone]    Current Level of Care: Hospital Recommended Level of Care: [place] Prior Approval Number:    Date Approved/Denied:   PASRR Number: [id number]  Discharge Plan: SNF    Current Diagnoses: Patient Active Problem List   Diagnosis Date Noted  . Acute respiratory failure with hypoxia ([place])   . Dyspnea 07/19/2020  . CHF exacerbation ([place]) 07/19/2020  . Edema 05/26/2020  . ETD (eustachian tube dysfunction) 04/24/2020  . Insomnia 10/15/2019  . Medicare annual wellness visit, subsequent 07/17/2019  . Tinea cruris 02/12/2019  . Urinary retention 02/10/2019  . Dysuria 02/07/2019  . Restlessness 01/23/2019  . At risk for falls 07/21/2018  . Laceration of left hand 11/13/2017  . Gait abnormality 05/19/2017  . Hyperlipemia 03/29/2017  . HTN (hypertension) 03/29/2017  . Advance care planning 02/25/2017  . Macular degeneration 02/25/2017  . Transient speech disturbance   . Bilateral carotid artery stenosis   . History of stroke   . TIA (transient ischemic attack) 01/30/2017  . Acute ischemic stroke ([place]) 01/30/2017  . Low back pain with right-sided sciatica 12/01/2016  . Diabetes mellitus type 2, controlled ([place]) 12/01/2016  . CAD (coronary artery disease) 12/01/2016  . Hypothyroidism 12/01/2016  . Back pain 11/02/2016  . Aftercare following surgery of the circulatory system, [place] 06/28/2013  . Cough 04/18/2013  . COPD (chronic obstructive pulmonary disease) ([place]) 11/22/2012  .  Occlusion and stenosis of carotid artery without mention of cerebral infarction 06/29/2012  . Peripheral vascular disease ([place]) 12/30/2011    Orientation RESPIRATION BLADDER Height & Weight     Self    Incontinent, External catheter Weight: 140 lb (63.5 kg) Height:  5\' 6"  (167.6 cm)  BEHAVIORAL SYMPTOMS/MOOD NEUROLOGICAL BOWEL NUTRITION STATUS      Incontinent    AMBULATORY STATUS COMMUNICATION OF NEEDS Skin   Extensive Assist Verbally                         Personal Care Assistance Level of Assistance  Bathing, Feeding, Dressing, Total care Bathing Assistance: Limited assistance Feeding assistance: Limited assistance   Total Care Assistance: Limited assistance   Functional Limitations Info             SPECIAL CARE FACTORS FREQUENCY  PT (By licensed PT), OT (By licensed OT)     PT Frequency: 5 x weekly OT Frequency: 5 x weekly            Contractures      Additional Factors Info  Code Status Code Status Info: DNR             Current Medications (07/21/2020):  This is the current hospital active medication list Current Facility-Administered Medications  Medication Dose Route Frequency Provider Last Rate Last Admin  . 0.9 %  sodium chloride infusion  250 mL Intravenous PRN Elwyn Reach, MD      . acetaminophen (TYLENOL) tablet 650 mg  650 mg Oral Q4H PRN Elwyn Reach, MD   650 mg at 07/21/20  1604  . albuterol (PROVENTIL) (2.5 MG/3ML) 0.083% nebulizer solution 2.5 mg  2.5 mg Inhalation Q6H PRN Elwyn Reach, MD   2.5 mg at 07/19/20 2300  . atenolol (TENORMIN) tablet 50 mg  50 mg Oral Daily Fritzi Mandes, MD   50 mg at 07/21/20 0826  . clopidogrel (PLAVIX) tablet 75 mg  75 mg Oral Daily Elwyn Reach, MD   75 mg at 07/21/20 0826  . clotrimazole (LOTRIMIN) 1 % cream   Topical BID Jonelle Sidle, Mohammad L, MD      . enoxaparin (LOVENOX) injection 40 mg  40 mg Subcutaneous Q24H Gala Romney L, MD   40 mg at 07/20/20 2134  . ezetimibe (ZETIA)  tablet 10 mg  10 mg Oral Daily Gala Romney L, MD   10 mg at 07/21/20 0826   And  . simvastatin (ZOCOR) tablet 40 mg  40 mg Oral q1800 Elwyn Reach, MD   40 mg at 07/21/20 1604  . furosemide (LASIX) injection 40 mg  40 mg Intravenous BID Elwyn Reach, MD   40 mg at 07/21/20 1602  . hydrALAZINE (APRESOLINE) injection 10 mg  10 mg Intravenous Q6H PRN Fritzi Mandes, MD      . levothyroxine (SYNTHROID) tablet 50 mcg  50 mcg Oral QAC breakfast Elwyn Reach, MD   50 mcg at 07/21/20 0826  . multivitamin with minerals tablet 1 tablet  1 tablet Oral Daily Elwyn Reach, MD   1 tablet at 07/21/20 0826  . ondansetron (ZOFRAN) injection 4 mg  4 mg Intravenous Q6H PRN Gala Romney L, MD      . potassium chloride SA (KLOR-CON) CR tablet 40 mEq  40 mEq Oral Q4H WA Chatterjee, [place]ro[place]a Tublu, MD      . sodium chloride flush (NS) 0.9 % injection 3 mL  3 mL Intravenous Q12H Elwyn Reach, MD   3 mL at 07/21/20 0827  . sodium chloride flush (NS) 0.9 % injection 3 mL  3 mL Intravenous PRN Gala Romney L, MD      . trolamine salicylate (ASPERCREME) 10 % cream   Topical PRN Sharion Settler, NP   Given at 07/20/20 0341     Discharge Medications: Please see discharge summary for a list of discharge medications.  Relevant Imaging Results:  Relevant Lab Results:   Additional Information ss#[id number].  Meriel Flavors, LCSW

## 2020-07-22 ENCOUNTER — Inpatient Hospital Stay: Payer: Medicare PPO

## 2020-07-22 DIAGNOSIS — R0600 Dyspnea, unspecified: Secondary | ICD-10-CM

## 2020-07-22 LAB — BASIC METABOLIC PANEL
Anion gap: 11 (ref 5–15)
BUN: 23 mg/dL (ref 8–23)
CO2: 26 mmol/L (ref 22–32)
Calcium: 9.1 mg/dL (ref 8.9–10.3)
Chloride: 97 mmol/L — ABNORMAL LOW (ref 98–111)
Creatinine, Ser: 1.29 mg/dL — ABNORMAL HIGH (ref 0.61–1.24)
GFR calc Af Amer: 55 mL/min — ABNORMAL LOW (ref >60)
GFR calc non Af Amer: 47 mL/min — ABNORMAL LOW (ref >60)
Glucose, Bld: 119 mg/dL — ABNORMAL HIGH (ref 70–99)
Potassium: 4.1 mmol/L (ref 3.5–5.1)
Sodium: 134 mmol/L — ABNORMAL LOW (ref 135–145)

## 2020-07-22 MED ORDER — SODIUM CHLORIDE 0.9 % IV SOLN
500.0000 mg | INTRAVENOUS | Status: DC
  Administered 2020-07-22: 500 mg via INTRAVENOUS
  Filled 2020-07-22: qty 500

## 2020-07-22 MED ORDER — CAMPHOR-MENTHOL 0.5-0.5 % EX LOTN
TOPICAL_LOTION | CUTANEOUS | Status: DC | PRN
  Filled 2020-07-22: qty 222

## 2020-07-22 NOTE — TOC Progression Note (Signed)
Transition of Care [facility]) - Progression Note    Patient Details  Name: Dylan Knight MRN: [id number] Date of Birth: [birth year]  Transition of Care [facility]) CM/SW [place], LCSW Phone Number: 07/22/2020, 12:21 PM  Clinical Narrative: CSW notified patient and his wife of home health recommendations. Provided CMS scores for agencies that serve their zip code. Patient has worked with [facility] in the past. Left message for [facility] representative to see if they would be able to take him again.     Expected Discharge Plan: [place] Barriers to Discharge: No Barriers Identified  Expected Discharge Plan and Services Expected Discharge Plan: [place]   Discharge Planning Services: CM Consult   Living arrangements for the past 2 months: Apartment (Handicap accessible.)                                       Social Determinants of Health (SDOH) Interventions    Readmission Risk Interventions No flowsheet data found.

## 2020-07-22 NOTE — Progress Notes (Signed)
Mobility Specialist - Progress Note   07/22/20 1231  Mobility  Activity Transferred:  Bed to chair  Level of Assistance Contact guard assist, steadying assist  Assistive Device Front wheel walker  Distance Ambulated (ft) 3 ft  Mobility Response Tolerated well  Mobility performed by Mobility specialist  $Mobility charge 1 Mobility    Pre-mobility: 76 HR, 104/55 BP, 95% SpO2 During mobility: 76 HR, 93% SpO2 Post-mobility: 74 HR, 107/66 BP, 93% SpO2   Pt was sitting EOB utilizing 1L [place] O2 with wife present in room upon arrival. Pt agreed to session. Pt denied any pain, dizziness, or fatigue. Pt was CGA while ambulating to recliner. Pt's O2 levels remained above 93% throughout session. Overall, pt tolerated session well. Pt was left in recliner with alarm set and phone/call bell in reach. Nurse entered room at the end of session and was notified of performance.    Kathee Delton Mobility Specialist 07/22/20, 12:35 PM

## 2020-07-22 NOTE — Progress Notes (Signed)
PROGRESS NOTE    Dylan Knight  QVZ:[id number]  DOB: Oct 28, 1924  DOA: 07/19/2020 PCP: Tonia Ghent, MD Outpatient Specialists:   Hospital course:  86 year old man with DM 2, HTN, CAD, CVD and PVD was admitted 07/19/2020 with increasing shortness of breath.  Of note he had recently been treated for CAP as an outpatient with some interval improvement in symptoms until he developed markedly worsening shortness of breath and lower extremity edema.  Chest x-ray revealed "progressive bibasilar interstitial and groundglass opacities suggestive of atypical pneumonia versus edema".  BNP was 540. Patient has been treated with Lasix 40 twice daily with excellent diuresis.  Echocardiogram revealed preserved EF with mild concentric LVH.  Subjective:  Patient continues to feel okay while at rest.  He was very surprised at how winded he got when he tried to ambulate yesterday.  He was very surprised his O2 saturations decreased to the mid 80s.  Notes he is comfortable as long as he sitting in a chair.  Objective: Vitals:   07/21/20 1951 07/22/20 0500 07/22/20 0512 07/22/20 0915  BP: 103/72  121/71   Pulse: 78  70   Resp: 20  16   Temp: 97.7 F (36.5 C)     TempSrc: Oral     SpO2: 97%  98% (!) 89%  Weight:  67.3 kg    Height:        Intake/Output Summary (Last 24 hours) at 07/22/2020 1457 Last data filed at 07/22/2020 1356 Gross per 24 hour  Intake 120 ml  Output 1200 ml  Net -1080 ml   Filed Weights   07/19/20 1618 07/22/20 0500  Weight: 63.5 kg 67.3 kg     Exam:  General: Patient sitting in recliner with his attentive wife at bedside in an ARD with nasal cannula in place.  Patient able to speak in full sentences without difficulty.  Eyes: sclera anicteric, conjuctiva mild injection bilaterally CVS: S1-S2, regular  Respiratory: Decreased air entry bilaterally but otherwise clear to auscultation without any adventitious sounds.  I do not hear any rales or rhonchi or  wheezes. GI: NABS, soft, NT  LE: No edema.  Neuro: A/O x 3, Moving all extremities equally with normal strength, CN 3-12 intact, grossly nonfocal.  Psych: patient is logical and coherent, judgement and insight appear normal, mood and affect appropriate to situation.   Assessment & Plan:   86 year old man with significant vasculopathy is admitted with increasing shortness of breath and lower extremity edema shortly after being treated as an outpatient for community-acquired pneumonia.  He has been treated with diuresis.  Acute hypoxic respiratory failure Initially attributed to acute decompensation of diastolic heart failure. Patient had excellent diuresis however continues to have significant desaturation to mid 80s with ambulation.  Chest x-ray done yesterday shows no improvement in patchy infiltration/groundglass opacification in the right lobe. CT done today is more consistent with UIP per radiology read. I have discussed with pulmonary consult, Dr. Lanney Gins who has agreed to see patient tomorrow. Discontinue Lasix.  Hypokalemia Normalized with repletion  HTN Continue atenolol  CAD No evidence for acute ischemic event, patient had no chest pain and EKG was within normal limits on admission.  No troponins were sent, BNP was modestly elevated at 550. Continue Plavix  PVD Continue Plavix  Hypothyroidism Continue Synthroid   DVT prophylaxis: Lovenox Code Status: DNR Family Communication: Patient's wife is at bedside throughout Disposition Plan:   Patient is from: Home  Anticipated Discharge Location: Home versus rehab  Barriers  to Discharge: Ongoing desaturation with exertion  Is patient medically stable for Discharge: No   Consultants:  None  Procedures:  None  Antimicrobials:  None   Data Reviewed:  Basic Metabolic Panel: Recent Labs  Lab 07/19/20 1625 07/20/20 0312 07/21/20 0454 07/22/20 0654  NA 134* 137 137 134*  K 4.0 3.2* 3.2* 4.1  CL 100  101 97* 97*  CO2 23 25 28 26   GLUCOSE 110* 136* 138* 119*  BUN 12 12 13 23   CREATININE 1.19 1.24 1.13 1.29*  CALCIUM 8.8* 9.1 9.3 9.1  MG  --  2.1  --   --    Liver Function Tests: Recent Labs  Lab 07/19/20 1625  AST 37  ALT 10  ALKPHOS 51  BILITOT 1.7*  PROT 6.0*  ALBUMIN 3.6   No results for input(s): LIPASE, AMYLASE in the last 168 hours. No results for input(s): AMMONIA in the last 168 hours. CBC: Recent Labs  Lab 07/19/20 1625  WBC 7.4  NEUTROABS 5.7  HGB 14.9  HCT 43.4  MCV 104.3*  PLT 192   Cardiac Enzymes: No results for input(s): CKTOTAL, CKMB, CKMBINDEX, TROPONINI in the last 168 hours. BNP (last 3 results) No results for input(s): PROBNP in the last 8760 hours. CBG: No results for input(s): GLUCAP in the last 168 hours.  Recent Results (from the past 240 hour(s))  Blood culture (routine single)     Status: None (Preliminary result)   Collection Time: 07/19/20  4:25 PM   Specimen: BLOOD  Result Value Ref Range Status   Specimen Description BLOOD RFA  Final   Special Requests   Final    BOTTLES DRAWN AEROBIC AND ANAEROBIC Blood Culture adequate volume   Culture   Final    NO GROWTH 3 DAYS Performed at [facility], [address], [place], [place] [address]    Report Status PENDING  Incomplete  Blood culture (single)     Status: None (Preliminary result)   Collection Time: 07/19/20  4:25 PM   Specimen: BLOOD  Result Value Ref Range Status   Specimen Description BLOOD LFA  Final   Special Requests   Final    BOTTLES DRAWN AEROBIC AND ANAEROBIC Blood Culture adequate volume   Culture   Final    NO GROWTH 3 DAYS Performed at [facility], [address] [place], [place] [address]    Report Status PENDING  Incomplete  SARS Coronavirus 2 by RT PCR (hospital order, performed in [place] hospital lab) Nasopharyngeal Nasopharyngeal Swab     Status: None   Collection Time: 07/19/20  4:55 PM   Specimen: Nasopharyngeal Swab   Result Value Ref Range Status   SARS Coronavirus 2 NEGATIVE NEGATIVE Final    Comment: (NOTE) SARS-CoV-2 target nucleic acids are NOT DETECTED.  The SARS-CoV-2 RNA is generally detectable in upper and lower respiratory specimens during the acute phase of infection. The lowest concentration of SARS-CoV-2 viral copies this assay can detect is 250 copies / mL. A negative result does not preclude SARS-CoV-2 infection and should not be used as the sole basis for treatment or other patient management decisions.  A negative result may occur with improper specimen collection / handling, submission of specimen other than nasopharyngeal swab, presence of viral mutation(s) within the areas targeted by this assay, and inadequate number of viral copies (<250 copies / mL). A negative result must be combined with clinical observations, patient history, and epidemiological information.  Fact Sheet for Patients:  StrictlyIdeas.no  Fact Sheet for Healthcare Providers: BankingDealers.co.za  This test is not yet approved or  cleared by the [place] FDA and has been authorized for detection and/or diagnosis of SARS-CoV-2 by FDA under an Emergency Use Authorization (EUA).  This EUA will remain in effect (meaning this test can be used) for the duration of the COVID-19 declaration under Section 564(b)(1) of the Act, 21 U.S.C. section 360bbb-3(b)(1), unless the authorization is terminated or revoked sooner.  Performed at [facility], [address] [place], [place] [address]   Urine culture     Status: Abnormal   Collection Time: 07/19/20  7:56 PM   Specimen: Urine, Random  Result Value Ref Range Status   Specimen Description   Final    URINE, RANDOM Performed at [facility], [address] [place], [place] [address]    Special Requests   Final    NONE Performed at [facility], [address]  [place], [place] [address]    Culture (A)  Final    <10,000 COLONIES/mL INSIGNIFICANT GROWTH Performed at [facility], [place] [address], [place], [place] [address]    Report Status 07/21/2020 FINAL  Final      Studies: DG Chest 2 View  Result Date: 07/21/2020 CLINICAL DATA:  Shortness of breath EXAM: CHEST - 2 VIEW COMPARISON:  July 19, 2020 FINDINGS: Cardiomediastinal contours are stable compared to the prior study. There is subtle interstitial thickening in the RIGHT chest that is similar to the prior study. No lobar consolidation, no pleural effusion. On limited assessment skeletal structures without acute process. IMPRESSION: Subtle interstitial thickening in the RIGHT chest that is similar to the prior study. No lobar consolidation or pleural effusion. Findings remain concerning for atypical infection or less likely asymmetric edema, progressive interstitial fibrotic changes from repeated aspiration could potentially have a similar appearance. There was some septal thickening in this area in 2010 though studies from August of 2021 do not show the degree of interstitial thickening present on today's exam. Electronically Signed   By: Zetta Bills M.D.   On: 07/21/2020 17:55   CT CHEST WO CONTRAST  Result Date: 07/22/2020 CLINICAL DATA:  86 year old male with history of respiratory illness. Increasing shortness of breath. EXAM: CT CHEST WITHOUT CONTRAST TECHNIQUE: Multidetector CT imaging of the chest was performed following the standard protocol without IV contrast. COMPARISON:  Chest CT 11/14/2008. FINDINGS: Cardiovascular: Heart size is normal. There is no significant pericardial fluid, thickening or pericardial calcification. There is aortic atherosclerosis, as well as atherosclerosis of the great vessels of the mediastinum and the coronary arteries, including calcified atherosclerotic plaque in the left main, left anterior descending, left circumflex and right coronary arteries. Mild  calcifications of the aortic valve and mitral annulus. Mediastinum/Nodes: No pathologically enlarged mediastinal or hilar lymph nodes. Please note that accurate exclusion of hilar adenopathy is limited on noncontrast CT scans. Esophagus is unremarkable in appearance. Lungs/Pleura: Widespread areas of septal thickening, subpleural reticulation, traction bronchiectasis, peripheral bronchiolectasis and honeycombing are noted in the lungs (right greater than left). These findings have a craniocaudal gradient and are clearly progressive compared to remote prior study from 2010. There is also diffuse bronchial wall thickening with mild to moderate centrilobular and paraseptal emphysema. No acute consolidative airspace disease. No pleural effusions. A few scattered small pulmonary nodules are noted in the lungs bilaterally, largest of which is in the right lower lobe (axial image 94 of series 4) measuring 1.0 x 0.8 cm (mean diameter  of 9 mm). Upper Abdomen: Aortic atherosclerosis. Calcified granuloma in the central liver. Right-sided extrarenal pelvis incidentally noted. Musculoskeletal: Chronic appearing compression fracture of L1 with 20% loss of anterior vertebral body height. There are no aggressive appearing lytic or blastic lesions noted in the visualized portions of the skeleton. IMPRESSION: 1. The appearance of the lungs is compatible with interstitial lung disease, and despite the asymmetry of the findings the overall appearance is considered diagnostic of usual interstitial pneumonia (UIP) per current ATS guidelines. 2. Multiple small pulmonary nodules measuring 9 mm or less in size. These are nonspecific, but repeat noncontrast chest CT is suggested in 6 months to ensure the stability or resolution of these findings. 3. Diffuse bronchial wall thickening with mild to moderate centrilobular and paraseptal emphysema; imaging findings suggestive of underlying COPD. 4. Aortic atherosclerosis, in addition to left main  and 3 vessel coronary artery disease. 5. There are calcifications of the aortic valve and mitral annulus. Echocardiographic correlation for evaluation of potential valvular dysfunction may be warranted if clinically indicated. Aortic Atherosclerosis (ICD10-I70.0) and Emphysema (ICD10-J43.9). Electronically Signed   By: Vinnie Langton M.D.   On: 07/22/2020 11:03     Scheduled Meds: . atenolol  50 mg Oral Daily  . clopidogrel  75 mg Oral Daily  . clotrimazole   Topical BID  . enoxaparin (LOVENOX) injection  40 mg Subcutaneous Q24H  . ezetimibe  10 mg Oral Daily   And  . simvastatin  40 mg Oral q1800  . levothyroxine  50 mcg Oral QAC breakfast  . multivitamin with minerals  1 tablet Oral Daily  . sodium chloride flush  3 mL Intravenous Q12H   Continuous Infusions: . sodium chloride      Principal Problem:   CHF exacerbation (HCC) Active Problems:   Peripheral vascular disease (HCC)   COPD (chronic obstructive pulmonary disease) (HCC)   CAD (coronary artery disease)   Hypothyroidism   Hyperlipemia   HTN (hypertension)   At risk for falls   Dyspnea     Saydi Kobel Derek Jack, [facility]  If 7PM-7AM, please contact night-coverage [email or web address] Password [email or web address] 07/22/2020, 2:57 PM    LOS: 3 days

## 2020-07-22 NOTE — Care Management Important Message (Signed)
Important Message  Patient Details  Name: Dylan Knight MRN: [id number] Date of Birth: 04/15/1924   Medicare Important Message Given:  Yes   Initial Medicare IM given by Patient Access Associate on 07/21/2020 at 11:01am.    [name] 07/22/2020, 9:38 AM

## 2020-07-22 NOTE — Evaluation (Signed)
Occupational Therapy Evaluation Patient Details Name: KEENAN TREFRY MRN: [id number] DOB: 10-25-1924 Today's Date: 07/22/2020    History of Present Illness Rondarius Kadrmas is a 54yoM who comes [facility] on 9/10 c SOB. PMH: CAD, OA, BPH, CAD, DM, HTN, HLD, hypoTSH, PVD, remote CVA c balance impairment. Pt recently treated for CAP. Negative screen for COVID-19 but bilateral chest findings consistent with CHF, also having anasarca. Uses RW at baseline for AMB at home.   Clinical Impression   Patient presenting with decreased I in self care, balance, functional mobility/transfers, endurance and safety awareness. Patient reports being mod I PTA with use of rollator and increased time to complete task. Patient currently functioning at min A overall with use of RW. Pt remained on RA during session with O2 saturation being 90-93% with self care tasks.  Patient will benefit from acute OT to increase overall independence in the areas of ADLs, functional mobility, and safety awareness in order to safely discharge home with caregiver.    Follow Up Recommendations  Home health OT;Supervision/Assistance - 24 hour    Equipment Recommendations  None recommended by OT    Recommendations for Other Services Other (comment) (none known at this time)     Precautions / Restrictions Precautions Precautions: Fall      Mobility Bed Mobility    General bed mobility comments: Pt seated in recliner chair when therapist entered the room  Transfers Overall transfer level: Needs assistance Equipment used: Rolling walker (2 wheeled) Transfers: Sit to/from Stand Sit to Stand: Min assist         General transfer comment: min lifting assist to stand from recliner chair    Balance Overall balance assessment: Needs assistance Sitting-balance support: Feet supported Sitting balance-Leahy Scale: Good     Standing balance support: During functional activity Standing balance-Leahy Scale: Fair Standing balance  comment: reliance on RW          ADL either performed or assessed with clinical judgement   ADL Overall ADL's : Needs assistance/impaired       General ADL Comments: Pt needs increased time with set up A - min A for standing balance with LB self care tasks. Set up A for UB self care seated in recliner chair. Pt able to perform figure four postion to increase I with LB self care.     Vision Baseline Vision/History: Wears glasses              Pertinent Vitals/Pain Pain Assessment: No/denies pain     Hand Dominance Right   Extremity/Trunk Assessment Upper Extremity Assessment Upper Extremity Assessment: Generalized weakness;RUE deficits/detail RUE Deficits / Details: residual weakness and coordination deficits from (2005)CVA   Lower Extremity Assessment Lower Extremity Assessment: Generalized weakness       Communication Communication Communication: HOH   Cognition Arousal/Alertness: Awake/alert Behavior During Therapy: WFL for tasks assessed/performed Overall Cognitive Status: Within Functional Limits for tasks assessed                   Home Living Family/patient expects to be discharged to:: Private residence Living Arrangements: Spouse/significant other Available Help at Discharge: Family Type of Home: Apartment Home Access: Level entry     Home Layout: One level     Bathroom Shower/Tub: Occupational psychologist: Handicapped height Bathroom Accessibility: Yes How Accessible: Accessible via walker Home Equipment: [place] - 4 wheels;Shower seat - built in;Grab bars - tub/shower          Prior Functioning/Environment Level of  Independence: Independent with assistive device(s)        Comments: wife does all the driving but he performs all self care at mod I level        OT Problem List: Decreased strength;Cardiopulmonary status limiting activity;Decreased activity tolerance;Decreased safety awareness;Impaired balance (sitting and/or  standing)      OT Treatment/Interventions: Self-care/ADL training;Therapeutic exercise;Therapeutic activities;Energy conservation;DME and/or AE instruction;Patient/family education;Balance training    OT Goals(Current goals can be found in the care plan section) Acute Rehab OT Goals Patient Stated Goal: to go home OT Goal Formulation: With patient Time For Goal Achievement: 08/05/20 Potential to Achieve Goals: Good ADL Goals Pt Will Perform Grooming: with modified independence;standing Pt Will Perform Lower Body Dressing: with modified independence;sit to/from stand Pt Will Transfer to Toilet: with modified independence;ambulating Pt Will Perform Toileting - Clothing Manipulation and hygiene: with modified independence;sit to/from stand  OT Frequency: Min 1X/week   Barriers to D/C: Other (comment)  none known at this time          AM-PAC OT "6 Clicks" Daily Activity     Outcome Measure Help from another person eating meals?: None Help from another person taking care of personal grooming?: None Help from another person toileting, which includes using toliet, bedpan, or urinal?: A Little Help from another person bathing (including washing, rinsing, drying)?: A Little Help from another person to put on and taking off regular upper body clothing?: None Help from another person to put on and taking off regular lower body clothing?: A Little 6 Click Score: 21   End of Session Nurse Communication: Mobility status;Precautions  Activity Tolerance: Patient tolerated treatment well Patient left: with call bell/phone within reach;in chair;with chair alarm set  OT Visit Diagnosis: Muscle weakness (generalized) (M62.81)                Time: 7793-9030 OT Time Calculation (min): 27 min Charges:  OT General Charges $OT Visit: 1 Visit OT Evaluation $OT Eval Low Complexity: 1 Low OT Treatments $Self Care/Home Management : 8-22 mins  Darleen Crocker, MS, OTR/L , CBIS ascom [phone]   07/22/20, 3:58 PM

## 2020-07-22 NOTE — Evaluation (Signed)
Physical Therapy Evaluation Patient Details Name: Dylan Knight MRN: [id number] DOB: 01/22/1924 Today's Date: 07/22/2020   History of Present Illness  Labrian Torregrossa is a 7yoM who comes to [facility] on 9/10 c SOB. PMH: CAD, OA, BPH, CAD, DM, HTN, HLD, hypoTSH, PVD, remote CVA c balance impairment. Pt recently treated for CAP. Negative screen for COVID-19 but bilateral chest findings consistent with CHF, also having anasarca. Uses RW at baseline for AMB at home.  Clinical Impression  Pt admitted with above diagnosis. Pt currently with functional limitations due to the deficits listed below (see "PT Problem List"). Upon entry, pt at EOB bed finishing meal, awake and agreeable to participate. The pt is alert and oriented x4, pleasant, conversational, and generally a good historian. Noted Left eye MD and general HOH, both accommodated in session. Pt AMB into hallway both with and without O2, has performance arresting DOE requiring 30sec recovery Q12ft regardless, but 3L maintained prevents desaturation into low 70s%. Functional mobility assessment demonstrates increased effort/time requirements, poor tolerance, and need for physical assistance, whereas the patient performed these at a higher level of independence PTA. Pt will benefit from skilled PT intervention to increase independence and safety with basic mobility in preparation for discharge to the venue listed below.       Follow Up Recommendations Home health PT;Supervision - Intermittent    Equipment Recommendations  Other (comment) (Would need O2 at home if DC today)    Recommendations for Other Services       Precautions / Restrictions Precautions Precautions: Fall      Mobility  Bed Mobility Overal bed mobility: Needs Assistance Bed Mobility: Supine to Sit     Supine to sit: Supervision     General bed mobility comments: deconditioned but determined; additional time/effort, sans physical assist  Transfers Overall transfer  level: Needs assistance Equipment used: Rolling walker (2 wheeled) Transfers: Sit to/from Stand Sit to Stand: Supervision         General transfer comment: several attempts with high velocity thrust strategy  Ambulation/Gait Ambulation/Gait assistance: Supervision Gait Distance (Feet): 240 Feet Assistive device: Rolling walker (2 wheeled) Gait Pattern/deviations: Ataxic;Step-to pattern     General Gait Details: 2-point RW gait; minimal trunk excursion. Pt has to stop to recover DOE despite 3L/min [place]. Has desat on RA into 70s%, on O2, high 80s% on 3L.  Stairs            Wheelchair Mobility    Modified Rankin (Stroke Patients Only)       Balance                                             Pertinent Vitals/Pain Pain Assessment: No/denies pain    Home Living Family/patient expects to be discharged to:: Private residence Living Arrangements: Spouse/significant other Available Help at Discharge: Family Type of Home: Apartment Home Access: Level entry     Home Layout: One level Home Equipment: Environmental consultant - 4 wheels      Prior Function                 Hand Dominance        Extremity/Trunk Assessment   Upper Extremity Assessment Upper Extremity Assessment: Generalized weakness    Lower Extremity Assessment Lower Extremity Assessment: Generalized weakness       Communication      Cognition Arousal/Alertness: Awake/alert Behavior During Therapy:  WFL for tasks assessed/performed Overall Cognitive Status: Within Functional Limits for tasks assessed                                        General Comments      Exercises     Assessment/Plan    PT Assessment Patient needs continued PT services  PT Problem List Cardiopulmonary status limiting activity;Decreased mobility;Decreased activity tolerance;Decreased strength;Decreased knowledge of use of DME;Decreased balance       PT Treatment Interventions DME  instruction;Gait training;Stair training;Functional mobility training;Therapeutic activities;Therapeutic exercise;Balance training;Neuromuscular re-education;Cognitive remediation;Patient/family education    PT Goals (Current goals can be found in the Care Plan section)  Acute Rehab PT Goals Patient Stated Goal: recoverign breathing/strength, not need O2 at home. PT Goal Formulation: With patient Time For Goal Achievement: 08/05/20 Potential to Achieve Goals: Fair    Frequency Min 2X/week   Barriers to discharge        Co-evaluation               AM-PAC PT "6 Clicks" Mobility  Outcome Measure Help needed turning from your back to your side while in a flat bed without using bedrails?: None Help needed moving from lying on your back to sitting on the side of a flat bed without using bedrails?: None Help needed moving to and from a bed to a chair (including a wheelchair)?: A Little Help needed standing up from a chair using your arms (e.g., wheelchair or bedside chair)?: A Little Help needed to walk in hospital room?: A Little Help needed climbing 3-5 steps with a railing? : A Lot 6 Click Score: 19    End of Session Equipment Utilized During Treatment: Oxygen;Gait belt Activity Tolerance: Patient tolerated treatment well;Treatment limited secondary to medical complications (Comment) Patient left: in chair;with chair alarm set;with call bell/phone within reach   PT Visit Diagnosis: Unsteadiness on feet (R26.81);Difficulty in walking, not elsewhere classified (R26.2);Muscle weakness (generalized) (M62.81)    Time: 0177-9390 PT Time Calculation (min) (ACUTE ONLY): 39 min   Charges:   PT Evaluation $PT Eval Moderate Complexity: 1 Mod PT Treatments $Therapeutic Exercise: 23-37 mins        10:06 AM, 07/22/20 [name], PT, DPT Physical Therapist - [facility]  [phone] ([place])    Mason Dibiasio C 07/22/2020, 10:01 AM

## 2020-07-23 DIAGNOSIS — J849 Interstitial pulmonary disease, unspecified: Secondary | ICD-10-CM

## 2020-07-23 LAB — BASIC METABOLIC PANEL
Anion gap: 9 (ref 5–15)
BUN: 26 mg/dL — ABNORMAL HIGH (ref 8–23)
CO2: 29 mmol/L (ref 22–32)
Calcium: 9.4 mg/dL (ref 8.9–10.3)
Chloride: 94 mmol/L — ABNORMAL LOW (ref 98–111)
Creatinine, Ser: 1.2 mg/dL (ref 0.61–1.24)
GFR calc Af Amer: 60 mL/min — ABNORMAL LOW (ref >60)
GFR calc non Af Amer: 51 mL/min — ABNORMAL LOW (ref >60)
Glucose, Bld: 139 mg/dL — ABNORMAL HIGH (ref 70–99)
Potassium: 4 mmol/L (ref 3.5–5.1)
Sodium: 132 mmol/L — ABNORMAL LOW (ref 135–145)

## 2020-07-23 MED ORDER — ALBUTEROL SULFATE (2.5 MG/3ML) 0.083% IN NEBU: 3.0000 mL | INHALATION_SOLUTION | RESPIRATORY_TRACT | Status: DC | PRN

## 2020-07-23 NOTE — Progress Notes (Signed)
Physical Therapy Treatment Patient Details Name: Dylan Knight MRN: [id number] DOB: 04/02/1924 Today's Date: 07/23/2020    History of Present Illness Luisdavid Hamblin is a 60yoM who comes to [facility] on 9/10 c SOB. PMH: CAD, OA, BPH, CAD, DM, HTN, HLD, hypoTSH, PVD, remote CVA c balance impairment. Pt recently treated for CAP. Negative screen for COVID-19 but bilateral chest findings consistent with CHF, also having anasarca. Uses RW at baseline for AMB at home.    PT Comments    Pt at EOB finishing breakfast, [place] doffed, no dyspnea. Pt assisted to EOB, pt dons slippers without assist. Rise to standing continues to require max assist and minimally controlled movements. Pt able to AMB slowly around unit, performance near identical to previous day, maintained on 2-3L O2, but still has severe dyspnea Q54ft that warrants stopping rest interval, wherein dyspnea recovers in ~30sec. Continuous O2 monitoring is attempted, but pt has poor circulation and heavy gripping which makes it appear as though he is desaturating greater than he actually is, generally signal is poor. Pt remains very limited in mobility due to breathing issues, generally weak in transfers.     Follow Up Recommendations  Home health PT;Supervision - Intermittent     Equipment Recommendations  Other (comment) (O2)    Recommendations for Other Services       Precautions / Restrictions Precautions Precautions: Fall Precaution Comments: O2    Mobility  Bed Mobility Overal bed mobility: Modified Independent                Transfers Overall transfer level: Needs assistance Equipment used: Rolling walker (2 wheeled) Transfers: Sit to/from Stand Sit to Stand: Supervision         General transfer comment: max effort, +seevral attempts c trunk thrust  Ambulation/Gait   Gait Distance (Feet): 240 Feet Assistive device: Rolling walker (2 wheeled) Gait Pattern/deviations: Ataxic;Step-to pattern     General Gait  Details: 2-point RW gait; minimal trunk excursion. Pt has to stop to recover SEVERE DOE despite 3L/min [place]. O2 sats notoriously difficult to obtain due to continues cold hands + white knuckling on RW handle; similar to yesterday, dysnpea always recovers within 30 seconds. [place] dizziness, CP.   Stairs             Wheelchair Mobility    Modified Rankin (Stroke Patients Only)       Balance                                            Cognition Arousal/Alertness: Awake/alert Behavior During Therapy: WFL for tasks assessed/performed Overall Cognitive Status: Within Functional Limits for tasks assessed                                 General Comments: some mild intermittent STM deficits      Exercises      General Comments        Pertinent Vitals/Pain Pain Assessment: No/denies pain    Home Living                      Prior Function            PT Goals (current goals can now be found in the care plan section) Acute Rehab PT Goals Patient Stated Goal: to go home PT Goal  Formulation: With patient Time For Goal Achievement: 08/05/20 Potential to Achieve Goals: Fair Progress towards PT goals: Not progressing toward goals - comment    Frequency    Min 2X/week      PT Plan Current plan remains appropriate    Co-evaluation              AM-PAC PT "6 Clicks" Mobility   Outcome Measure  Help needed turning from your back to your side while in a flat bed without using bedrails?: None Help needed moving from lying on your back to sitting on the side of a flat bed without using bedrails?: None Help needed moving to and from a bed to a chair (including a wheelchair)?: A Little Help needed standing up from a chair using your arms (e.g., wheelchair or bedside chair)?: A Little Help needed to walk in hospital room?: A Little Help needed climbing 3-5 steps with a railing? : A Lot 6 Click Score: 19    End of Session  Equipment Utilized During Treatment: Oxygen;Gait belt Activity Tolerance: Patient tolerated treatment well;Treatment limited secondary to medical complications (Comment) Patient left: with chair alarm set;with call bell/phone within reach;in bed Nurse Communication: Mobility status PT Visit Diagnosis: Unsteadiness on feet (R26.81);Difficulty in walking, not elsewhere classified (R26.2);Muscle weakness (generalized) (M62.81)     Time: 0076-2263 PT Time Calculation (min) (ACUTE ONLY): 34 min  Charges:  $Therapeutic Exercise: 23-37 mins                     12:18 PM, 07/23/20 [name], PT, DPT Physical Therapist - [facility]  [phone] ([place])     Colquitt C 07/23/2020, 12:14 PM

## 2020-07-23 NOTE — Progress Notes (Signed)
Mobility Specialist - Progress Note   07/23/20 1336  Mobility  Activity Stood at bedside;Turned to back (supine)  Level of Assistance Maximum assist, patient does 25-49%  Assistive Device Front wheel walker  Distance Ambulated (ft) 0 ft  Mobility Response Tolerated well  Mobility performed by Mobility specialist  $Mobility charge 1 Mobility    Pre-mobility: 74 HR, 97% SpO2 During mobility: 74 HR, 81% SpO2 Post-mobility: 70 HR, 127/63 BP, 95% SpO2   Pt was sitting EOB with wife present in room upon arrival. Pt was utilizing 1L [place] O2 at beginning of session. Pt used RW to perform sit-to-stand 1x with lateral ambulation towards HOB for positioning, maxA d/t cord managing. Pt was able to transfer from EOB to supine with modA. With [facility] elevated, heavy breathing and gasping was noted and O2 desat to 81%. [place] was increased to 2L for recovery. Per discussion with nurse, mobility tech was given approval to leave pt on 2L. Overall, pt tolerated session well. Pt was left in bed with alarm set and phone/call bell in reach.    Kathee Delton Mobility Specialist 07/23/20, 1:49 PM

## 2020-07-23 NOTE — Consult Note (Signed)
Pulmonary Medicine          Date: 07/23/2020,   MRN# [id number] Dylan Knight [birth year]     AdmissionWeight: 63.5 kg                 CurrentWeight: 67.3 kg   Referring physician: Dr Shelbie Proctor   CHIEF COMPLAINT:   New finding of interstitial lung disease with UIP pattern   HISTORY OF PRESENT ILLNESS    Dylan Knight is a 86 y.o. male with medical history significant of coronary artery disease, osteoarthritis, BPH, carotid artery disease, diabetes, hypertension, hyperlipidemia, hypothyroidism and peripheral vascular disease, history of CVA who has not seen a cardiologist apparently for over 10 years.  He was recently treated for community-acquired pneumonia.  He is at home with his wife who is also oxygen dependent.  Patient was brought in due to worsening shortness of breath cough and increasing leg swellings abdominal swelling.  On arrival oxygen sat was in the 70s.  Currently placed on oxygen and is doing fine.  Patient has had full Covid vaccines.  His evaluation showed negative screen for COVID-19 but bilateral chest findings consistent with CHF.  He is also visibly having anasarca.  Patient therefore being admitted with acute exacerbation of CHF.  He has not had an echo in a long time so not sure of his ejection fraction. Temperature is 97.4 blood pressure 142/75 pulse 101 respirate of 27 oxygen sat 77% on room air.  Sodium 134 potassium 4.0 chloride 100 CO2 23 glucose 110 creatinine 1.19 and calcium 8.8.  Total bilirubin is 1.7 BNP 543 lactic acid 1.5.  CBC appear to be within normal.  Urinalysis is negative.  Chest x-ray showed pulmonary vascular congestion with progressive groundglass opacities.  Patient being admitted with acute exacerbation of CHF CT chest with findings of UIP pattern with apicobasal gradient and peripheral predominant honeycombing suggestive of early pulmonary fibrosis. We discussed clinical significance of this and patients wife is at bedside who know  a lot about this condition as she is currently on Ofev and previously was on Esbriet for IPF.    PAST MEDICAL HISTORY   Past Medical History:  Diagnosis Date  . Arthritis    left hip  . BPH (benign prostatic hyperplasia)   . Cancer [facility])    Skin cancers removed  . Carotid artery occlusion   . COPD (chronic obstructive pulmonary disease) ([place])   . Diabetes mellitus without complication ([place])   . Hyperlipidemia   . Hypertension   . Hypothyroidism   . Macular degeneration   . Peripheral arterial disease ([place]) 2002  . Stroke [facility]) 2005     SURGICAL HISTORY   Past Surgical History:  Procedure Laterality Date  . ABDOMINAL AORTIC ANEURYSM REPAIR  2003   Aorto-left femoral-right iliac BPG by Dr. Scot Dock  . Aortic aneursym surgery     2003- Dr. Scot Dock  . BACK SURGERY     1960  . CAROTID ENDARTERECTOMY  Jan. 25,2007   Right CEA  . Carotid stenosis surgery on right     2007  . CARPAL TUNNEL RELEASE Left 06/02/2016   Procedure: LEFT CARPAL TUNNEL RELEASE;  Surgeon: Daryll Brod, MD;  Location: [place];  Service: Orthopedics;  Laterality: Left;  . EYE SURGERY     Catarct surgery and lens implant - bilateral  . PR VEIN BYPASS GRAFT,AORTO-FEM-POP  2005   Revision Right Fem-pop  . PR VEIN BYPASS GRAFT,AORTO-FEM-POP  2002   Right  Fem-pop  . RADIOLOGY WITH ANESTHESIA N/A 12/02/2016   Procedure: MRI LUMBAR SPINE WITHOUT;  Surgeon: Medication Radiologist, MD;  Location: [place];  Service: Radiology;  Laterality: N/A;  . Right leg bypass     2002 -  and amputation of right fifth toe  . SPINE SURGERY  1960   lumbar spine  . [place], 2007   cervical spine  . TOE AMPUTATION  08-2001   right 5th toe amputation     FAMILY HISTORY   Family History  Problem Relation Age of Onset  . Stroke Mother   . Hypertension Mother   . Heart disease Father   . Heart attack Father   . Cancer Brother   . Hypertension Brother      SOCIAL HISTORY   Social History    Tobacco Use  . Smoking status: Former Smoker    Packs/day: 1.00    Years: 63.00    Pack years: 63.00    Types: Cigarettes    Quit date: 04/09/2004    Years since quitting: 16.2  . Smokeless tobacco: Former Systems developer  . Tobacco comment: Quit smoking in 2005  Vaping Use  . Vaping Use: Never used  Substance Use Topics  . Alcohol use: No    Alcohol/week: 0.0 standard drinks  . Drug use: No     MEDICATIONS    Home Medication:    Current Medication:  Current Facility-Administered Medications:  .  0.9 %  sodium chloride infusion, 250 mL, Intravenous, PRN, Jonelle Sidle, Mohammad L, MD .  acetaminophen (TYLENOL) tablet 650 mg, 650 mg, Oral, Q4H PRN, Elwyn Reach, MD, 650 mg at 07/21/20 2335 .  albuterol (PROVENTIL) (2.5 MG/3ML) 0.083% nebulizer solution 2.5 mg, 2.5 mg, Inhalation, Q6H PRN, Gala Romney L, MD, 2.5 mg at 07/19/20 2300 .  atenolol (TENORMIN) tablet 50 mg, 50 mg, Oral, Daily, Fritzi Mandes, MD, 50 mg at 07/23/20 2119 .  camphor-menthol (SARNA) lotion, , Topical, PRN, Bonnell Public Tublu, MD .  clopidogrel (PLAVIX) tablet 75 mg, 75 mg, Oral, Daily, Gala Romney L, MD, 75 mg at 07/23/20 0838 .  clotrimazole (LOTRIMIN) 1 % cream, , Topical, BID, Elwyn Reach, MD, Given at 07/22/20 2106 .  enoxaparin (LOVENOX) injection 40 mg, 40 mg, Subcutaneous, Q24H, Garba, Mohammad L, MD, 40 mg at 07/22/20 2104 .  ezetimibe (ZETIA) tablet 10 mg, 10 mg, Oral, Daily, 10 mg at 07/23/20 0837 **AND** simvastatin (ZOCOR) tablet 40 mg, 40 mg, Oral, q1800, Gala Romney L, MD, 40 mg at 07/22/20 1648 .  hydrALAZINE (APRESOLINE) injection 10 mg, 10 mg, Intravenous, Q6H PRN, Fritzi Mandes, MD .  levothyroxine (SYNTHROID) tablet 50 mcg, 50 mcg, Oral, QAC breakfast, Elwyn Reach, MD, 50 mcg at 07/23/20 0838 .  multivitamin with minerals tablet 1 tablet, 1 tablet, Oral, Daily, Elwyn Reach, MD, 1 tablet at 07/23/20 0837 .  ondansetron (ZOFRAN) injection 4 mg, 4 mg, Intravenous, Q6H  PRN, Garba, Mohammad L, MD .  sodium chloride flush (NS) 0.9 % injection 3 mL, 3 mL, Intravenous, Q12H, Jonelle Sidle, Mohammad L, MD, 3 mL at 07/22/20 2105 .  sodium chloride flush (NS) 0.9 % injection 3 mL, 3 mL, Intravenous, PRN, Jonelle Sidle, Mohammad L, MD .  trolamine salicylate (ASPERCREME) 10 % cream, , Topical, PRN, Sharion Settler, NP, Given at 07/20/20 0341    ALLERGIES   Iohexol and Iodine     REVIEW OF SYSTEMS    Review of Systems:  Gen:  Denies  fever, sweats, chills weigh  loss  HEENT: Denies blurred vision, double vision, ear pain, eye pain, hearing loss, nose bleeds, sore throat Cardiac:  No dizziness, chest pain or heaviness, chest tightness,edema Resp:   Denies cough or sputum porduction, shortness of breath,wheezing, hemoptysis,  Gi: Denies swallowing difficulty, stomach pain, nausea or vomiting, diarrhea, constipation, bowel incontinence Gu:  Denies bladder incontinence, burning urine Ext:   Denies Joint pain, stiffness or swelling Skin: Denies  skin rash, easy bruising or bleeding or hives Endoc:  Denies polyuria, polydipsia , polyphagia or weight change Psych:   Denies depression, insomnia or hallucinations   Other:  All other systems negative   VS: BP 127/77 (BP Location: Right Wrist)   Pulse 73   Temp 97.8 F (36.6 C) (Oral)   Resp 16   Ht 5\' 6"  (1.676 m)   Wt 67.3 kg   SpO2 94%   BMI 23.95 kg/m      PHYSICAL EXAM    GENERAL:NAD, no fevers, chills, no weakness no fatigue HEAD: Normocephalic, atraumatic.  EYES: Pupils equal, round, reactive to light. Extraocular muscles intact. No scleral icterus.  MOUTH: Moist mucosal membrane. Dentition intact. No abscess noted.  EAR, NOSE, THROAT: Clear without exudates. No external lesions.  NECK: Supple. No thyromegaly. No nodules. No JVD.  PULMONARY: mild velcro crepitations at bases bilaterally  CARDIOVASCULAR: S1 and S2. Regular rate and rhythm. No murmurs, rubs, or gallops. No edema. Pedal pulses 2+  bilaterally.  GASTROINTESTINAL: Soft, nontender, nondistended. No masses. Positive bowel sounds. No hepatosplenomegaly.  MUSCULOSKELETAL: No swelling, clubbing, or edema. Range of motion full in all extremities.  NEUROLOGIC: Cranial nerves II through XII are intact. No gross focal neurological deficits. Sensation intact. Reflexes intact.  SKIN: No ulceration, lesions, rashes, or cyanosis. Skin warm and dry. Turgor intact.  PSYCHIATRIC: Mood, affect within normal limits. The patient is awake, alert and oriented x 3. Insight, judgment intact.       IMAGING    DG Chest 2 View  Result Date: 07/21/2020 CLINICAL DATA:  Shortness of breath EXAM: CHEST - 2 VIEW COMPARISON:  July 19, 2020 FINDINGS: Cardiomediastinal contours are stable compared to the prior study. There is subtle interstitial thickening in the RIGHT chest that is similar to the prior study. No lobar consolidation, no pleural effusion. On limited assessment skeletal structures without acute process. IMPRESSION: Subtle interstitial thickening in the RIGHT chest that is similar to the prior study. No lobar consolidation or pleural effusion. Findings remain concerning for atypical infection or less likely asymmetric edema, progressive interstitial fibrotic changes from repeated aspiration could potentially have a similar appearance. There was some septal thickening in this area in 2010 though studies from August of 2021 do not show the degree of interstitial thickening present on today's exam. Electronically Signed   By: Zetta Bills M.D.   On: 07/21/2020 17:55   DG Chest 2 View  Result Date: 07/08/2020 CLINICAL DATA:  86 year old male with history of cough with sputum production and small amount of hemoptysis. EXAM: CHEST - 2 VIEW COMPARISON:  Chest x-ray 11/02/2016. FINDINGS: Lung volumes are increased. Diffuse peribronchial cuffing with widespread areas of interstitial prominence scattered throughout the lungs bilaterally, most  severe throughout the mid to lower right lung. No confluent consolidative airspace disease. No pleural effusions. No evidence of pulmonary edema. No pneumothorax. Heart size is normal. Upper mediastinal contours are within normal limits. Aortic atherosclerosis. IMPRESSION: 1. Findings are concerning for bronchitis and potential bronchopneumonia developing throughout the right middle and lower lobes. 2. Aortic atherosclerosis. Electronically  Signed   By: Vinnie Langton M.D.   On: 07/08/2020 16:28   CT CHEST WO CONTRAST  Result Date: 07/22/2020 CLINICAL DATA:  86 year old male with history of respiratory illness. Increasing shortness of breath. EXAM: CT CHEST WITHOUT CONTRAST TECHNIQUE: Multidetector CT imaging of the chest was performed following the standard protocol without IV contrast. COMPARISON:  Chest CT 11/14/2008. FINDINGS: Cardiovascular: Heart size is normal. There is no significant pericardial fluid, thickening or pericardial calcification. There is aortic atherosclerosis, as well as atherosclerosis of the great vessels of the mediastinum and the coronary arteries, including calcified atherosclerotic plaque in the left main, left anterior descending, left circumflex and right coronary arteries. Mild calcifications of the aortic valve and mitral annulus. Mediastinum/Nodes: No pathologically enlarged mediastinal or hilar lymph nodes. Please note that accurate exclusion of hilar adenopathy is limited on noncontrast CT scans. Esophagus is unremarkable in appearance. Lungs/Pleura: Widespread areas of septal thickening, subpleural reticulation, traction bronchiectasis, peripheral bronchiolectasis and honeycombing are noted in the lungs (right greater than left). These findings have a craniocaudal gradient and are clearly progressive compared to remote prior study from 2010. There is also diffuse bronchial wall thickening with mild to moderate centrilobular and paraseptal emphysema. No acute consolidative  airspace disease. No pleural effusions. A few scattered small pulmonary nodules are noted in the lungs bilaterally, largest of which is in the right lower lobe (axial image 94 of series 4) measuring 1.0 x 0.8 cm (mean diameter of 9 mm). Upper Abdomen: Aortic atherosclerosis. Calcified granuloma in the central liver. Right-sided extrarenal pelvis incidentally noted. Musculoskeletal: Chronic appearing compression fracture of L1 with 20% loss of anterior vertebral body height. There are no aggressive appearing lytic or blastic lesions noted in the visualized portions of the skeleton. IMPRESSION: 1. The appearance of the lungs is compatible with interstitial lung disease, and despite the asymmetry of the findings the overall appearance is considered diagnostic of usual interstitial pneumonia (UIP) per current ATS guidelines. 2. Multiple small pulmonary nodules measuring 9 mm or less in size. These are nonspecific, but repeat noncontrast chest CT is suggested in 6 months to ensure the stability or resolution of these findings. 3. Diffuse bronchial wall thickening with mild to moderate centrilobular and paraseptal emphysema; imaging findings suggestive of underlying COPD. 4. Aortic atherosclerosis, in addition to left main and 3 vessel coronary artery disease. 5. There are calcifications of the aortic valve and mitral annulus. Echocardiographic correlation for evaluation of potential valvular dysfunction may be warranted if clinically indicated. Aortic Atherosclerosis (ICD10-I70.0) and Emphysema (ICD10-J43.9). Electronically Signed   By: Vinnie Langton M.D.   On: 07/22/2020 11:03   DG Chest Port 1 View  Result Date: 07/19/2020 CLINICAL DATA:  Shortness of breath and weakness, history of pneumonia status post antibiotic therapy EXAM: PORTABLE CHEST 1 VIEW COMPARISON:  07/08/2020 FINDINGS: Single frontal view of the chest demonstrates a stable cardiac silhouette. There is progressive interstitial and ground-glass  opacity at the lung bases, right greater than left. No effusion or pneumothorax. No acute bony abnormalities. IMPRESSION: 1. Progressive bibasilar interstitial and ground-glass opacities, most pronounced at the right lung base. Findings could reflect progressive atypical pneumonia versus edema. Electronically Signed   By: Randa Ngo M.D.   On: 07/19/2020 17:25   ECHOCARDIOGRAM COMPLETE  Result Date: 07/20/2020    ECHOCARDIOGRAM REPORT   Patient Name:   DAION GINSBERG Date of Exam: 07/20/2020 Medical Rec #:  [id number]       Height:       66.0 in  Accession #:    [id number]      Weight:       140.0 lb Date of Birth:  Jan 13, 1924       BSA:          1.719 m Patient Age:    59 years        BP:           140/80 mmHg Patient Gender: M               HR:           82 bpm. Exam Location:  [facility] Procedure: 2D Echo Indications:     CHF 428.21/ I50.21  History:         Patient has prior history of Echocardiogram examinations, most                  recent 01/31/2017.  Sonographer:     Talmadge Chad RN Referring Phys:  [place] Diagnosing Phys: Neoma Laming MD  Sonographer Comments: Technically difficult study due to poor echo windows, suboptimal apical window and suboptimal parasternal window. IMPRESSIONS  1. Left ventricular ejection fraction, by estimation, is 60 to 65%. The left ventricle has normal function. The left ventricle has no regional wall motion abnormalities. There is mild concentric left ventricular hypertrophy. Left ventricular diastolic parameters are consistent with Grade I diastolic dysfunction (impaired relaxation).  2. Right ventricular systolic function is normal. The right ventricular size is normal.  3. Left atrial size was mildly dilated.  4. The mitral valve is normal in structure. Trivial mitral valve regurgitation. No evidence of mitral stenosis. Severe mitral annular calcification.  5. Tricuspid valve regurgitation is mild to moderate.  6. The aortic valve is calcified. Aortic valve  regurgitation is not visualized. Mild aortic valve sclerosis is present, with no evidence of aortic valve stenosis.  7. The inferior vena cava is normal in size with greater than 50% respiratory variability, suggesting right atrial pressure of 3 mmHg. FINDINGS  Left Ventricle: Left ventricular ejection fraction, by estimation, is 60 to 65%. The left ventricle has normal function. The left ventricle has no regional wall motion abnormalities. The left ventricular internal cavity size was normal in size. There is  mild concentric left ventricular hypertrophy. Left ventricular diastolic parameters are consistent with Grade I diastolic dysfunction (impaired relaxation). Right Ventricle: The right ventricular size is normal. No increase in right ventricular wall thickness. Right ventricular systolic function is normal. Left Atrium: Left atrial size was mildly dilated. Right Atrium: Right atrial size was normal in size. Pericardium: There is no evidence of pericardial effusion. Mitral Valve: The mitral valve is normal in structure. Severe mitral annular calcification. Trivial mitral valve regurgitation. No evidence of mitral valve stenosis. Tricuspid Valve: The tricuspid valve is normal in structure. Tricuspid valve regurgitation is mild to moderate. No evidence of tricuspid stenosis. Aortic Valve: The aortic valve is calcified. Aortic valve regurgitation is not visualized. Mild aortic valve sclerosis is present, with no evidence of aortic valve stenosis. Pulmonic Valve: The pulmonic valve was normal in structure. Pulmonic valve regurgitation is mild. No evidence of pulmonic stenosis. Aorta: The aortic root is normal in size and structure. Venous: The inferior vena cava is normal in size with greater than 50% respiratory variability, suggesting right atrial pressure of 3 mmHg. IAS/Shunts: No atrial level shunt detected by color flow Doppler.  LEFT VENTRICLE PLAX 2D LVIDd:         3.33 cm  Diastology LVIDs:  2.23 cm   LV e' lateral: 15.90 cm/s LV PW:         1.30 cm LV IVS:        1.13 cm LVOT diam:     1.80 cm LVOT Area:     2.54 cm  RIGHT VENTRICLE RV Basal diam:  4.17 cm LEFT ATRIUM         Index LA diam:    2.30 cm 1.34 cm/m   AORTA Ao Root diam: 3.20 cm TRICUSPID VALVE TV Peak grad:   38.3 mmHg TV Vmax:        3.09 m/s  SHUNTS Systemic Diam: 1.80 cm Neoma Laming MD Electronically signed by Neoma Laming MD Signature Date/Time: 07/20/2020/7:28:51 PM    Final       ASSESSMENT/PLAN   Interstitial lung disease with UIP pattern - patient with mild hypoxemia on 1L/min supplemental O2 at this time -no signs of Acute exacerbation of ILD -discussed natural history and prognosis of pulmonary fibrosis -wife is very knowledgeable due to chronic history of ILD with IPF on Ofev currently -patient would benefit from PRN albuterol  -no need to initiate anti-fibrotic therapy while hospitalized -will follow up with on outpatient in [facility] pulmonary -patient is cleared for d/c home from pulmonary perspective     Thank you for allowing me to participate in the care of this patient.    Patient/Family are satisfied with care plan and all questions have been answered.  This document was prepared using Dragon voice recognition software and may include unintentional dictation errors.     Ottie Glazier, M.D.  Division of [facility]

## 2020-07-23 NOTE — TOC Progression Note (Addendum)
Transition of Care [facility]) - Progression Note    Patient Details  Name: Dylan Knight MRN: [id number] Date of Birth: Nov 13, 1923  Transition of [facility] CM/SW [place], LCSW Phone Number: 07/23/2020, 10:45 AM  Clinical Narrative:  Notified patient's wife that [facility] unable to accept referral. Wife has no other preference. CSW will start with highest rated of those agencies that accept [facility]. [facility] representative will call back to get referral information.   11:44 am: Referral made to [facility] for PT, OT, and RN has been accepted. Wife is aware and agreeable.  Expected Discharge Plan: [place] Barriers to Discharge: No Barriers Identified  Expected Discharge Plan and Services Expected Discharge Plan: [place]   Discharge Planning Services: CM Consult   Living arrangements for the past 2 months: Apartment (Handicap accessible.)                                       Social Determinants of Health (SDOH) Interventions    Readmission Risk Interventions No flowsheet data found.

## 2020-07-23 NOTE — Progress Notes (Signed)
Occupational Therapy Treatment Patient Details Name: Dylan Knight MRN: [id number] DOB: [birth year] Today's Date: 07/23/2020    History of present illness Dylan Knight is a 47yoM who comes to [facility] on 9/10 c SOB. PMH: CAD, OA, BPH, CAD, DM, HTN, HLD, hypoTSH, PVD, remote CVA c balance impairment. Pt recently treated for CAP. Negative screen for COVID-19 but bilateral chest findings consistent with CHF, also having anasarca. Uses RW at baseline for AMB at home.   OT comments  Dylan Knight was seen for OT treatment on this date. Upon arrival to room pt seated EOB requesting bathing/grooming. Pt requires MIN A + RW sit<>stand - assist to stabilize RW as pt uses high amplitude trunk movements c BUE support on RW. SBA + RW tooth brushing standing sinkside - single UE support c standing rest breaks. SETUP face washing seated EOB. SETUP bathing UB seated EOB, SBA bathing thighs/rear in standing. Increased time doff B socks seated EOB, MOD A don. Increased time don [place] - pt reports difficulty using RUE r/t hx of CVA. MIN A + RW for ADL t/f - assist to steady RW. Pt making good progress toward goals. Pt continues to benefit from skilled OT services to maximize return to PLOF and minimize risk of future falls, injury, caregiver burden, and readmission. Will continue to follow POC. Discharge recommendation remains appropriate.   Sitting: SpO2 95% on 1L [place] Standing ~8 mins: intermittent desat 83-86% - resolved c PLB and seated rest breaks     Follow Up Recommendations  Home health OT;Supervision/Assistance - 24 hour    Equipment Recommendations  None recommended by OT    Recommendations for Other Services      Precautions / Restrictions Precautions Precautions: Fall Precaution Comments: O2 Restrictions Weight Bearing Restrictions: No       Mobility Bed Mobility Overal bed mobility: Modified Independent    General bed mobility comments: Pt received and left sitting EOB  Transfers Overall  transfer level: Needs assistance Equipment used: Rolling walker (2 wheeled) Transfers: Sit to/from Stand Sit to Stand: Min assist      General transfer comment: Assist to stabilize RW, pt uses high amplutide movements to achieve lift off     Balance Overall balance assessment: Needs assistance Sitting-balance support: Feet supported Sitting balance-Leahy Scale: Good     Standing balance support: Single extremity supported;During functional activity Standing balance-Leahy Scale: Fair          ADL either performed or assessed with clinical judgement   ADL Overall ADL's : Needs assistance/impaired    General ADL Comments: SBA + RW tooth brushing standing sinkside - single UE support c standing rest breaks. SETUP face washing seated EOB. SETUP bathing UB seated EOB, SBA bathing thighs/rear in standing, MOD A for feet in sitting. Increased time doff B socks seated EOB, MOD A don. Increased time don [place] - pt reports difficulty using RUE r/t hx of CVA. MIN A + RW for ADL t/f - assist to steady RW.      Cognition Arousal/Alertness: Awake/alert Behavior During Therapy: WFL for tasks assessed/performed Overall Cognitive Status: Within Functional Limits for tasks assessed        General Comments: some mild intermittent STM deficits        Exercises Exercises: Other exercises Other Exercises Other Exercises: Pt and wife educated re: OT role, DME recs, d/c recs, ECS, falls prevention, home/routines modifications Other Exercises: Tooth brushing, face washing, UB/LB bathing, don/doff gown/socks, sitting/standing balance/tolerance, sit<>stand x3, ~20 ft in room mobility  General Comments Sitting: SpO2 95% on 1L [place]. Standing: intermittent desat 83-86% - resolved c PLB and seated rest breaks     Pertinent Vitals/ Pain       Pain Assessment: No/denies pain   Frequency  Min 1X/week        Progress Toward Goals  OT Goals(current goals can now be found in the care plan  section)  Progress towards OT goals: Progressing toward goals  Acute Rehab OT Goals Patient Stated Goal: to go home OT Goal Formulation: With patient Time For Goal Achievement: 08/05/20 Potential to Achieve Goals: Good ADL Goals Pt Will Perform Grooming: with modified independence;standing Pt Will Perform Lower Body Dressing: with modified independence;sit to/from stand Pt Will Transfer to Toilet: with modified independence;ambulating Pt Will Perform Toileting - Clothing Manipulation and hygiene: with modified independence;sit to/from stand  Plan Discharge plan remains appropriate;Frequency remains appropriate       AM-PAC OT "6 Clicks" Daily Activity     Outcome Measure   Help from another person eating meals?: None Help from another person taking care of personal grooming?: None Help from another person toileting, which includes using toliet, bedpan, or urinal?: A Little Help from another person bathing (including washing, rinsing, drying)?: A Little Help from another person to put on and taking off regular upper body clothing?: None Help from another person to put on and taking off regular lower body clothing?: A Little 6 Click Score: 21    End of Session Equipment Utilized During Treatment: Rolling walker;Oxygen  OT Visit Diagnosis: Muscle weakness (generalized) (M62.81)   Activity Tolerance Patient tolerated treatment well   Patient Left in bed;with call bell/phone within reach;with family/visitor present   Nurse Communication Other (comment) (needs external catheter reattached )        Time: 1005-1046 OT Time Calculation (min): 41 min  Charges: OT General Charges $OT Visit: 1 Visit OT Treatments $Self Care/Home Management : 23-37 mins $Therapeutic Activity: 8-22 mins  Dessie Coma, M.S. OTR/L  07/23/20, 2:43 PM  ascom [phone]

## 2020-07-23 NOTE — Progress Notes (Signed)
PROGRESS NOTE    Dylan Knight   ZYS:[id number]  DOB: 1924/05/07  DOA: 07/19/2020     4  PCP: Tonia Ghent, MD  CC: Day Kimball Hospital Course: Dylan Knight is a 86 yo CM with DM 2, HTN, CAD, CVD and PVD was admitted 07/19/2020 with increasing shortness of breath.  Of note he had recently been treated for CAP as an outpatient with some interval improvement in symptoms until he developed markedly worsening shortness of breath and lower extremity edema.  Chest x-ray revealed "progressive bibasilar interstitial and groundglass opacities suggestive of atypical pneumonia versus edema".  BNP was 540. Patient has been treated with Lasix 40 twice daily with excellent diuresis.  Echocardiogram revealed preserved EF with mild concentric LVH. Pulmonology was consulted for further recommendations.    Interval History:  No events overnight. Patient resting in bed with wife bedside this am and he is comfortable and breathing well on 1L [place]. He feels SOB but is controlled and does say he is overall better than when he initially came in.   Old records reviewed in assessment of this patient  ROS: Constitutional: negative for chills and fevers, Respiratory: positive for SOB, Cardiovascular: negative for chest pain and Gastrointestinal: negative for abdominal pain  Assessment & Plan: Acute hypoxic respiratory failure ILD with UIP pattern Initially attributed to acute decompensation of diastolic heart failure. - has diuresed well with lasix - CT chest performed which shows honeycombing and findings suggestive for early pulmonary fibrosis - added on PRN albuterol inhaler per pulm - no inpatient treatment needed at this time per pulmonology, appreciate assistance; patient will plan to follow up outpatient - if unable to wean off O2 prior to tomorrow, will try and arrange home O2  Hypokalemia Normalized with repletion  HTN Continue atenolol  CAD No evidence for acute ischemic event, patient had no  chest pain and EKG was within normal limits on admission.  No troponins were sent, BNP was modestly elevated at 550. Continue Plavix  PVD Continue Plavix  Hypothyroidism Continue Synthroid  Antimicrobials: none  DVT prophylaxis: Lovenox Code Status: DNR Family Communication: Wife bedside Disposition Plan: Status is: Inpatient  Remains inpatient appropriate because:Unsafe d/c plan and Inpatient level of care appropriate due to severity of illness   Dispo: The patient is from: Home              Anticipated d/c is to: Home              Anticipated d/c date is: 1 day              Patient currently is not medically stable to d/c.       Objective: Blood pressure 105/66, pulse 76, temperature (!) 97.5 F (36.4 C), temperature source Oral, resp. rate 16, height 5\' 6"  (1.676 m), weight 67.3 kg, SpO2 94 %.  Examination: General appearance: alert, cooperative and no distress Head: Normocephalic, without obvious abnormality, atraumatic Eyes: EOMI Lungs: bilateral crackles noted Heart: regular rate and rhythm and S1, S2 normal Abdomen: normal findings: bowel sounds normal and soft, non-tender Extremities: no edema Skin: mobility and turgor normal Neurologic: Grossly normal  Consultants:   Pulmonology   Procedures:   none  Data Reviewed: I have personally reviewed following labs and imaging studies Results for orders placed or performed during the hospital encounter of 07/19/20 (from the past 24 hour(s))  Basic metabolic panel     Status: Abnormal   Collection Time: 07/23/20  6:45 AM  Result Value  Ref Range   Sodium 132 (L) 135 - 145 mmol/L   Potassium 4.0 3.5 - 5.1 mmol/L   Chloride 94 (L) 98 - 111 mmol/L   CO2 29 22 - 32 mmol/L   Glucose, Bld 139 (H) 70 - 99 mg/dL   BUN 26 (H) 8 - 23 mg/dL   Creatinine, Ser 1.20 0.61 - 1.24 mg/dL   Calcium 9.4 8.9 - 10.3 mg/dL   GFR calc non Af Amer 51 (L) >60 mL/min   GFR calc Af Amer 60 (L) >60 mL/min   Anion gap 9 5 - 15      Recent Results (from the past 240 hour(s))  Blood culture (routine single)     Status: None (Preliminary result)   Collection Time: 07/19/20  4:25 PM   Specimen: BLOOD  Result Value Ref Range Status   Specimen Description BLOOD RFA  Final   Special Requests   Final    BOTTLES DRAWN AEROBIC AND ANAEROBIC Blood Culture adequate volume   Culture   Final    NO GROWTH 4 DAYS Performed at [facility], [address] [place], [place] [address]    Report Status PENDING  Incomplete  Blood culture (single)     Status: None (Preliminary result)   Collection Time: 07/19/20  4:25 PM   Specimen: BLOOD  Result Value Ref Range Status   Specimen Description BLOOD LFA  Final   Special Requests   Final    BOTTLES DRAWN AEROBIC AND ANAEROBIC Blood Culture adequate volume   Culture   Final    NO GROWTH 4 DAYS Performed at [facility], [address], [place], [place] [address]    Report Status PENDING  Incomplete  SARS Coronavirus 2 by RT PCR (hospital order, performed in [place] hospital lab) Nasopharyngeal Nasopharyngeal Swab     Status: None   Collection Time: 07/19/20  4:55 PM   Specimen: Nasopharyngeal Swab  Result Value Ref Range Status   SARS Coronavirus 2 NEGATIVE NEGATIVE Final    Comment: (NOTE) SARS-CoV-2 target nucleic acids are NOT DETECTED.  The SARS-CoV-2 RNA is generally detectable in upper and lower respiratory specimens during the acute phase of infection. The lowest concentration of SARS-CoV-2 viral copies this assay can detect is 250 copies / mL. A negative result does not preclude SARS-CoV-2 infection and should not be used as the sole basis for treatment or other patient management decisions.  A negative result may occur with improper specimen collection / handling, submission of specimen other than nasopharyngeal swab, presence of viral mutation(s) within the areas targeted by this assay, and inadequate number of viral copies (<250 copies /  mL). A negative result must be combined with clinical observations, patient history, and epidemiological information.  Fact Sheet for Patients:   StrictlyIdeas.no  Fact Sheet for Healthcare Providers: BankingDealers.co.za  This test is not yet approved or  cleared by the [place] FDA and has been authorized for detection and/or diagnosis of SARS-CoV-2 by FDA under an Emergency Use Authorization (EUA).  This EUA will remain in effect (meaning this test can be used) for the duration of the COVID-19 declaration under Section 564(b)(1) of the Act, 21 U.S.C. section 360bbb-3(b)(1), unless the authorization is terminated or revoked sooner.  Performed at [facility], [address] [place], [place] [address]   Urine culture     Status: Abnormal   Collection Time: 07/19/20  7:56 PM   Specimen: Urine, Random  Result Value Ref Range Status  Specimen Description   Final    URINE, RANDOM Performed at [facility], [address], [place], [place] [address]    Special Requests   Final    NONE Performed at [facility], [address] [place], [place] [address]    Culture (A)  Final    <10,000 COLONIES/mL INSIGNIFICANT GROWTH Performed at [facility] [address], [place], [place] [address]    Report Status 07/21/2020 FINAL  Final     Radiology Studies: CT CHEST WO CONTRAST  Result Date: 07/22/2020 CLINICAL DATA:  86 year old male with history of respiratory illness. Increasing shortness of breath. EXAM: CT CHEST WITHOUT CONTRAST TECHNIQUE: Multidetector CT imaging of the chest was performed following the standard protocol without IV contrast. COMPARISON:  Chest CT 11/14/2008. FINDINGS: Cardiovascular: Heart size is normal. There is no significant pericardial fluid, thickening or pericardial calcification. There is aortic atherosclerosis, as well as atherosclerosis of the great vessels of the  mediastinum and the coronary arteries, including calcified atherosclerotic plaque in the left main, left anterior descending, left circumflex and right coronary arteries. Mild calcifications of the aortic valve and mitral annulus. Mediastinum/Nodes: No pathologically enlarged mediastinal or hilar lymph nodes. Please note that accurate exclusion of hilar adenopathy is limited on noncontrast CT scans. Esophagus is unremarkable in appearance. Lungs/Pleura: Widespread areas of septal thickening, subpleural reticulation, traction bronchiectasis, peripheral bronchiolectasis and honeycombing are noted in the lungs (right greater than left). These findings have a craniocaudal gradient and are clearly progressive compared to remote prior study from 2010. There is also diffuse bronchial wall thickening with mild to moderate centrilobular and paraseptal emphysema. No acute consolidative airspace disease. No pleural effusions. A few scattered small pulmonary nodules are noted in the lungs bilaterally, largest of which is in the right lower lobe (axial image 94 of series 4) measuring 1.0 x 0.8 cm (mean diameter of 9 mm). Upper Abdomen: Aortic atherosclerosis. Calcified granuloma in the central liver. Right-sided extrarenal pelvis incidentally noted. Musculoskeletal: Chronic appearing compression fracture of L1 with 20% loss of anterior vertebral body height. There are no aggressive appearing lytic or blastic lesions noted in the visualized portions of the skeleton. IMPRESSION: 1. The appearance of the lungs is compatible with interstitial lung disease, and despite the asymmetry of the findings the overall appearance is considered diagnostic of usual interstitial pneumonia (UIP) per current ATS guidelines. 2. Multiple small pulmonary nodules measuring 9 mm or less in size. These are nonspecific, but repeat noncontrast chest CT is suggested in 6 months to ensure the stability or resolution of these findings. 3. Diffuse bronchial  wall thickening with mild to moderate centrilobular and paraseptal emphysema; imaging findings suggestive of underlying COPD. 4. Aortic atherosclerosis, in addition to left main and 3 vessel coronary artery disease. 5. There are calcifications of the aortic valve and mitral annulus. Echocardiographic correlation for evaluation of potential valvular dysfunction may be warranted if clinically indicated. Aortic Atherosclerosis (ICD10-I70.0) and Emphysema (ICD10-J43.9). Electronically Signed   By: Vinnie Langton M.D.   On: 07/22/2020 11:03   CT CHEST WO CONTRAST  Final Result    DG Chest 2 View  Final Result    DG Chest Port 1 View  Final Result      Scheduled Meds: . atenolol  50 mg Oral Daily  . clopidogrel  75 mg Oral Daily  . clotrimazole   Topical BID  . enoxaparin (LOVENOX) injection  40 mg Subcutaneous Q24H  . ezetimibe  10 mg Oral Daily  And  . simvastatin  40 mg Oral q1800  . levothyroxine  50 mcg Oral QAC breakfast  . multivitamin with minerals  1 tablet Oral Daily  . sodium chloride flush  3 mL Intravenous Q12H   PRN Meds: sodium chloride, acetaminophen, albuterol, albuterol, camphor-menthol, hydrALAZINE, ondansetron (ZOFRAN) IV, sodium chloride flush, trolamine salicylate Continuous Infusions: . sodium chloride        LOS: 4 days  Time spent: Greater than 50% of the 35 minute visit was spent in counseling/coordination of care for the patient as laid out in the A&P.   Dwyane Dee, MD [facility] 07/23/2020, 6:00 PM  Contact via secure chat.  To contact the attending provider between 7A-7P or the covering provider during after hours 7P-7A, please log into the web site [email or web address] and access using universal  password for that web site. If you do not have the password, please call the hospital operator.

## 2020-07-23 NOTE — Hospital Course (Addendum)
Dylan Knight is a 86 yo CM with DM 2, HTN, CAD, CVD and PVD was admitted 07/19/2020 with increasing shortness of breath.  Of note he had recently been treated for CAP as an outpatient with some interval improvement in symptoms until he developed markedly worsening shortness of breath and lower extremity edema.  Chest x-ray revealed "progressive bibasilar interstitial and groundglass opacities suggestive of atypical pneumonia versus edema".  BNP was 540. Patient has been treated with Lasix 40 twice daily with excellent diuresis.  Echocardiogram revealed preserved EF with mild concentric LVH. Pulmonology was consulted for further recommendations.  After pulmonology consultation, his CT was considered consistent with early signs of pulmonary fibrosis.  He was recommended for outpatient follow-up and conservative measures at this time.  No indication or need for steroids nor antifibrotic therapy at this time.  He was prescribed an albuterol inhaler at discharge. He also remained hypoxic requiring oxygen which was also arranged prior to discharge.

## 2020-07-24 DIAGNOSIS — J9621 Acute and chronic respiratory failure with hypoxia: Secondary | ICD-10-CM

## 2020-07-24 LAB — CULTURE, BLOOD (SINGLE)
Culture: NO GROWTH
Culture: NO GROWTH
Special Requests: ADEQUATE
Special Requests: ADEQUATE

## 2020-07-24 LAB — BASIC METABOLIC PANEL
Anion gap: 11 (ref 5–15)
BUN: 27 mg/dL — ABNORMAL HIGH (ref 8–23)
CO2: 24 mmol/L (ref 22–32)
Calcium: 9.2 mg/dL (ref 8.9–10.3)
Chloride: 96 mmol/L — ABNORMAL LOW (ref 98–111)
Creatinine, Ser: 1.18 mg/dL (ref 0.61–1.24)
GFR calc Af Amer: >60 mL/min (ref >60)
GFR calc non Af Amer: 53 mL/min — ABNORMAL LOW (ref >60)
Glucose, Bld: 138 mg/dL — ABNORMAL HIGH (ref 70–99)
Potassium: 4 mmol/L (ref 3.5–5.1)
Sodium: 131 mmol/L — ABNORMAL LOW (ref 135–145)

## 2020-07-24 MED ORDER — ALBUTEROL SULFATE HFA 108 (90 BASE) MCG/ACT IN AERS: 2.0000 | INHALATION_SPRAY | Freq: Four times a day (QID) | RESPIRATORY_TRACT | 5 refills | Status: DC | PRN

## 2020-07-24 MED ORDER — ATENOLOL 50 MG PO TABS
50.0000 mg | ORAL_TABLET | Freq: Every day | ORAL | 3 refills | Status: DC
Start: 2020-07-25 — End: 2020-09-17

## 2020-07-24 MED ORDER — FUROSEMIDE 20 MG PO TABS: 20.0000 mg | ORAL_TABLET | Freq: Every day | ORAL | 3 refills | Status: DC

## 2020-07-24 NOTE — Discharge Summary (Signed)
Physician Discharge Summary  Dylan Knight YYT:[id number] DOB: [birth year] DOA: 07/19/2020  PCP: Tonia Ghent, MD  Admit date: 07/19/2020 Discharge date: 07/24/2020  Admitted From: home Disposition:  home Discharging physician: Dwyane Dee, MD  Recommendations for Outpatient Follow-up:  1. Follow-up with pulmonology  Home Health: RN, PT, OT Equipment/Devices: Home O2 set up at discharge  Dylan discharged to home in Discharge Condition: stable CODE STATUS: DNR Diet recommendation:  Diet Orders (From admission, onward)    Start     Ordered   07/19/20 2120  Diet Heart Room service appropriate? Yes; Fluid consistency: Thin  Diet effective now       Question Answer Comment  Room service appropriate? Yes   Fluid consistency: Thin      07/19/20 2119          Hospital Course: Dylan Knight is a 86 yo CM with DM 2, HTN, CAD, CVD and PVD was admitted 07/19/2020 with increasing shortness of breath.  Of note he had recently been treated for CAP as an outpatient with some interval improvement in symptoms until he developed markedly worsening shortness of breath and lower extremity edema.  Chest x-ray revealed "progressive bibasilar interstitial and groundglass opacities suggestive of atypical pneumonia versus edema".  BNP was 540. Dylan has been treated with Lasix 40 twice daily with excellent diuresis.  Echocardiogram revealed preserved EF with mild concentric LVH. Pulmonology was consulted for further recommendations.  After pulmonology consultation, his CT was considered consistent with early signs of pulmonary fibrosis.  He was recommended for outpatient follow-up and conservative measures at this time.  No indication or need for steroids nor antifibrotic therapy at this time.  He was prescribed an albuterol inhaler at discharge. He also remained hypoxic requiring oxygen which was also arranged prior to discharge.   The Dylan's chronic medical conditions were treated accordingly  per the Dylan's home medication regimen except as noted.  On day of discharge, Dylan was felt deemed stable for discharge. Dylan/family member advised to call PCP or come back to ER if needed.   Discharge Diagnoses:   Principal Diagnosis: CHF exacerbation [facility])  Active Hospital Problems   Diagnosis Date Noted  . CHF exacerbation ([place]) 07/19/2020  . ILD (interstitial lung disease) ([place]) 07/23/2020  . Dyspnea 07/19/2020  . HTN (hypertension) 03/29/2017  . Hyperlipemia 03/29/2017  . CAD (coronary artery disease) 12/01/2016  . Hypothyroidism 12/01/2016  . COPD (chronic obstructive pulmonary disease) ([place]) 11/22/2012  . Peripheral vascular disease ([place]) 12/30/2011    Resolved Hospital Problems  No resolved problems to display.    Discharge Instructions    Increase activity slowly   Complete by: As directed      Allergies as of 07/24/2020      Reactions   Iohexol Hives, Other (See Comments)   Went away with benadryl   Iodine Rash      Medication List    STOP taking these medications   atenolol-chlorthalidone 50-25 MG tablet Commonly known as: TENORETIC     TAKE these medications   acetaminophen 325 MG tablet Commonly known as: TYLENOL Take 650 mg by mouth every 6 (six) hours as needed for mild pain.   albuterol 108 (90 Base) MCG/ACT inhaler Commonly known as: VENTOLIN HFA Inhale 2 puffs into the lungs every 6 (six) hours as needed for wheezing or shortness of breath.   atenolol 50 MG tablet Commonly known as: TENORMIN Take 1 tablet (50 mg total) by mouth daily. Start taking on: July 25, 2020  CENTRUM SILVER PO Take 1 tablet by mouth daily.   clopidogrel 75 MG tablet Commonly known as: PLAVIX TAKE 1 TABLET (75MG ) BY MOUTH EVERY DAY   clotrimazole-betamethasone cream Commonly known as: LOTRISONE APPLY TO AFFECTED AREA TWICE A DAY   ezetimibe-simvastatin 10-40 MG tablet Commonly known as: VYTORIN TAKE 1 TABLET BY MOUTH EVERY DAY   furosemide 20  MG tablet Commonly known as: LASIX Take 1 tablet (20 mg total) by mouth daily. What changed:   when to take this  reasons to take this   guaiFENesin 600 MG 12 hr tablet Commonly known as: MUCINEX Take 600 mg by mouth 2 (two) times daily.   levothyroxine 25 MCG tablet Commonly known as: SYNTHROID Take 2 tablets (50 mcg total) by mouth daily before breakfast.            Durable Medical Equipment  (From admission, onward)         Start     Ordered   07/24/20 1306  For home use only DME oxygen  Once       Question Answer Comment  Length of Need Lifetime   Mode or (Route) Nasal cannula   Liters per Minute 4   Frequency Continuous (stationary and portable oxygen unit needed)   Oxygen delivery system Gas      07/24/20 1305          Follow-up Information    [facility] Follow up on 08/06/2020.   Specialty: Cardiology Why: at 11:00am. Enter through the [place] entrance Contact information: [place] Follow up.   Specialty: Home Health Services Why: They will follow up with you for your home health needs. Contact information: [address] [place] [address] [id number]              Allergies  Allergen Reactions  . Iohexol Hives and Other (See Comments)    Went away with benadryl   . Iodine Rash    Consultations: Pulmonology  Discharge Exam: BP 127/76   Pulse 65   Temp (!) 97 F (36.1 C)   Resp (!) 22   Ht 5\' 6"  (1.676 m)   Wt 67.3 kg   SpO2 95%   BMI 23.95 kg/m  General appearance: alert, cooperative and no distress Head: Normocephalic, without obvious abnormality, atraumatic Eyes: EOMI Lungs: bilateral crackles noted Heart: regular rate and rhythm and S1, S2 normal Abdomen: normal findings: bowel sounds normal and soft, non-tender Extremities: no edema Skin: mobility and turgor  normal Neurologic: Grossly normal  The results of significant diagnostics from this hospitalization (including imaging, microbiology, ancillary and laboratory) are listed below for reference.   Microbiology: Recent Results (from the past 240 hour(s))  Blood culture (routine single)     Status: None   Collection Time: 07/19/20  4:25 PM   Specimen: BLOOD  Result Value Ref Range Status   Specimen Description BLOOD RFA  Final   Special Requests   Final    BOTTLES DRAWN AEROBIC AND ANAEROBIC Blood Culture adequate volume   Culture   Final    NO GROWTH 5 DAYS Performed at [facility], [address], [place], [place] [address]    Report Status 07/24/2020 FINAL  Final  Blood culture (single)     Status: None   Collection Time: 07/19/20  4:25 PM   Specimen: BLOOD  Result Value Ref Range Status   Specimen Description BLOOD LFA  Final   Special Requests   Final    BOTTLES DRAWN AEROBIC AND ANAEROBIC Blood Culture adequate volume   Culture   Final    NO GROWTH 5 DAYS Performed at [facility], [address], [place], [place] [address]    Report Status 07/24/2020 FINAL  Final  SARS Coronavirus 2 by RT PCR (hospital order, performed in [facility] hospital lab) Nasopharyngeal Nasopharyngeal Swab     Status: None   Collection Time: 07/19/20  4:55 PM   Specimen: Nasopharyngeal Swab  Result Value Ref Range Status   SARS Coronavirus 2 NEGATIVE NEGATIVE Final    Comment: (NOTE) SARS-CoV-2 target nucleic acids are NOT DETECTED.  The SARS-CoV-2 RNA is generally detectable in upper and lower respiratory specimens during the acute phase of infection. The lowest concentration of SARS-CoV-2 viral copies this assay can detect is 250 copies / mL. A negative result does not preclude SARS-CoV-2 infection and should not be used as the sole basis for treatment or other Dylan management decisions.  A negative result may occur with improper specimen collection / handling,  submission of specimen other than nasopharyngeal swab, presence of viral mutation(s) within the areas targeted by this assay, and inadequate number of viral copies (<250 copies / mL). A negative result must be combined with clinical observations, Dylan history, and epidemiological information.  Fact Sheet for Patients:   StrictlyIdeas.no  Fact Sheet for Healthcare Providers: BankingDealers.co.za  This test is not yet approved or  cleared by the [place] FDA and has been authorized for detection and/or diagnosis of SARS-CoV-2 by FDA under an Emergency Use Authorization (EUA).  This EUA will remain in effect (meaning this test can be used) for the duration of the COVID-19 declaration under Section 564(b)(1) of the Act, 21 U.S.C. section 360bbb-3(b)(1), unless the authorization is terminated or revoked sooner.  Performed at [facility], [address], [place], [place] [address]   Urine culture     Status: Abnormal   Collection Time: 07/19/20  7:56 PM   Specimen: Urine, Random  Result Value Ref Range Status   Specimen Description   Final    URINE, RANDOM Performed at [facility], [address], [place], [place] [address]    Special Requests   Final    NONE Performed at [facility], [address] [place], [place] [address]    Culture (A)  Final    <10,000 COLONIES/mL INSIGNIFICANT GROWTH Performed at [facility], [place] [address], [place], [place] [address]    Report Status 07/21/2020 FINAL  Final     Labs: BNP (last 3 results) Recent Labs    07/19/20 1625 07/19/20 2312  BNP 543.1* 532.9*   Basic Metabolic Panel: Recent Labs  Lab 07/20/20 0312 07/21/20 0454 07/22/20 0654 07/23/20 0645 07/24/20 0508  NA 137 137 134* 132* 131*  K 3.2* 3.2* 4.1 4.0 4.0  CL 101 97* 97* 94* 96*  CO2 25 28 26 29 24   GLUCOSE 136* 138* 119* 139* 138*  BUN 12 13 23  26* 27*  CREATININE 1.24  1.13 1.29* 1.20 1.18  CALCIUM 9.1 9.3 9.1 9.4 9.2  MG 2.1  --   --   --   --    Liver Function Tests: Recent Labs  Lab 07/19/20 1625  AST 37  ALT 10  ALKPHOS 51  BILITOT 1.7*  PROT 6.0*  ALBUMIN 3.6   No  results for input(s): LIPASE, AMYLASE in the last 168 hours. No results for input(s): AMMONIA in the last 168 hours. CBC: Recent Labs  Lab 07/19/20 1625  WBC 7.4  NEUTROABS 5.7  HGB 14.9  HCT 43.4  MCV 104.3*  PLT 192   Cardiac Enzymes: No results for input(s): CKTOTAL, CKMB, CKMBINDEX, TROPONINI in the last 168 hours. BNP: Invalid input(s): POCBNP CBG: No results for input(s): GLUCAP in the last 168 hours. D-Dimer No results for input(s): DDIMER in the last 72 hours. Hgb A1c No results for input(s): HGBA1C in the last 72 hours. Lipid Profile No results for input(s): CHOL, HDL, LDLCALC, TRIG, CHOLHDL, LDLDIRECT in the last 72 hours. Thyroid function studies No results for input(s): TSH, T4TOTAL, T3FREE, THYROIDAB in the last 72 hours.  Invalid input(s): FREET3 Anemia work up No results for input(s): VITAMINB12, FOLATE, FERRITIN, TIBC, IRON, RETICCTPCT in the last 72 hours. Urinalysis    Component Value Date/Time   COLORURINE YELLOW (A) 07/19/2020 1956   APPEARANCEUR CLEAR (A) 07/19/2020 1956   APPEARANCEUR Clear 11/24/2014 1359   LABSPEC 1.008 07/19/2020 1956   LABSPEC 1.008 11/24/2014 1359   PHURINE 7.0 07/19/2020 1956   GLUCOSEU NEGATIVE 07/19/2020 1956   GLUCOSEU Negative 11/24/2014 1359   HGBUR NEGATIVE 07/19/2020 1956   BILIRUBINUR NEGATIVE 07/19/2020 1956   BILIRUBINUR Negative 11/24/2014 [place] 07/19/2020 1956   PROTEINUR NEGATIVE 07/19/2020 1956   NITRITE NEGATIVE 07/19/2020 1956   LEUKOCYTESUR NEGATIVE 07/19/2020 1956   LEUKOCYTESUR Negative 11/24/2014 1359   Sepsis Labs Invalid input(s): PROCALCITONIN,  WBC,  LACTICIDVEN Microbiology Recent Results (from the past 240 hour(s))  Blood culture (routine single)     Status:  None   Collection Time: 07/19/20  4:25 PM   Specimen: BLOOD  Result Value Ref Range Status   Specimen Description BLOOD RFA  Final   Special Requests   Final    BOTTLES DRAWN AEROBIC AND ANAEROBIC Blood Culture adequate volume   Culture   Final    NO GROWTH 5 DAYS Performed at [facility], [address], [place], [place] [address]    Report Status 07/24/2020 FINAL  Final  Blood culture (single)     Status: None   Collection Time: 07/19/20  4:25 PM   Specimen: BLOOD  Result Value Ref Range Status   Specimen Description BLOOD LFA  Final   Special Requests   Final    BOTTLES DRAWN AEROBIC AND ANAEROBIC Blood Culture adequate volume   Culture   Final    NO GROWTH 5 DAYS Performed at [facility], [address] [place], [place] [address]    Report Status 07/24/2020 FINAL  Final  SARS Coronavirus 2 by RT PCR (hospital order, performed in [facility] hospital lab) Nasopharyngeal Nasopharyngeal Swab     Status: None   Collection Time: 07/19/20  4:55 PM   Specimen: Nasopharyngeal Swab  Result Value Ref Range Status   SARS Coronavirus 2 NEGATIVE NEGATIVE Final    Comment: (NOTE) SARS-CoV-2 target nucleic acids are NOT DETECTED.  The SARS-CoV-2 RNA is generally detectable in upper and lower respiratory specimens during the acute phase of infection. The lowest concentration of SARS-CoV-2 viral copies this assay can detect is 250 copies / mL. A negative result does not preclude SARS-CoV-2 infection and should not be used as the sole basis for treatment or other Dylan management decisions.  A negative result may occur with improper specimen collection / handling, submission of specimen other than nasopharyngeal swab,  presence of viral mutation(s) within the areas targeted by this assay, and inadequate number of viral copies (<250 copies / mL). A negative result must be combined with clinical observations, Dylan history, and epidemiological information.  Fact  Sheet for Patients:   StrictlyIdeas.no  Fact Sheet for Healthcare Providers: BankingDealers.co.za  This test is not yet approved or  cleared by the [place] FDA and has been authorized for detection and/or diagnosis of SARS-CoV-2 by FDA under an Emergency Use Authorization (EUA).  This EUA will remain in effect (meaning this test can be used) for the duration of the COVID-19 declaration under Section 564(b)(1) of the Act, 21 U.S.C. section 360bbb-3(b)(1), unless the authorization is terminated or revoked sooner.  Performed at [facility], [address], [place], [place] [address]   Urine culture     Status: Abnormal   Collection Time: 07/19/20  7:56 PM   Specimen: Urine, Random  Result Value Ref Range Status   Specimen Description   Final    URINE, RANDOM Performed at [facility], [address], [place], [place] [address]    Special Requests   Final    NONE Performed at [facility], [address] [place], [place] [address]    Culture (A)  Final    <10,000 COLONIES/mL INSIGNIFICANT GROWTH Performed at [facility], [place] [address] [place], [place] [address]    Report Status 07/21/2020 FINAL  Final    Procedures/Studies: DG Chest 2 View  Result Date: 07/21/2020 CLINICAL DATA:  Shortness of breath EXAM: CHEST - 2 VIEW COMPARISON:  July 19, 2020 FINDINGS: Cardiomediastinal contours are stable compared to the prior study. There is subtle interstitial thickening in the RIGHT chest that is similar to the prior study. No lobar consolidation, no pleural effusion. On limited assessment skeletal structures without acute process. IMPRESSION: Subtle interstitial thickening in the RIGHT chest that is similar to the prior study. No lobar consolidation or pleural effusion. Findings remain concerning for atypical infection or less likely asymmetric edema, progressive interstitial fibrotic changes  from repeated aspiration could potentially have a similar appearance. There was some septal thickening in this area in 2010 though studies from August of 2021 do not show the degree of interstitial thickening present on today's exam. Electronically Signed   By: Zetta Bills M.D.   On: 07/21/2020 17:55   DG Chest 2 View  Result Date: 07/08/2020 CLINICAL DATA:  86 year old male with history of cough with sputum production and small amount of hemoptysis. EXAM: CHEST - 2 VIEW COMPARISON:  Chest x-ray 11/02/2016. FINDINGS: Lung volumes are increased. Diffuse peribronchial cuffing with widespread areas of interstitial prominence scattered throughout the lungs bilaterally, most severe throughout the mid to lower right lung. No confluent consolidative airspace disease. No pleural effusions. No evidence of pulmonary edema. No pneumothorax. Heart size is normal. Upper mediastinal contours are within normal limits. Aortic atherosclerosis. IMPRESSION: 1. Findings are concerning for bronchitis and potential bronchopneumonia developing throughout the right middle and lower lobes. 2. Aortic atherosclerosis. Electronically Signed   By: Vinnie Langton M.D.   On: 07/08/2020 16:28   CT CHEST WO CONTRAST  Result Date: 07/22/2020 CLINICAL DATA:  86 year old male with history of respiratory illness. Increasing shortness of breath. EXAM: CT CHEST WITHOUT CONTRAST TECHNIQUE: Multidetector CT imaging of the chest was performed following the standard protocol without IV contrast. COMPARISON:  Chest CT 11/14/2008. FINDINGS: Cardiovascular: Heart size is normal. There is no significant pericardial fluid, thickening or pericardial calcification. There is  aortic atherosclerosis, as well as atherosclerosis of the great vessels of the mediastinum and the coronary arteries, including calcified atherosclerotic plaque in the left main, left anterior descending, left circumflex and right coronary arteries. Mild calcifications of the aortic  valve and mitral annulus. Mediastinum/Nodes: No pathologically enlarged mediastinal or hilar lymph nodes. Please note that accurate exclusion of hilar adenopathy is limited on noncontrast CT scans. Esophagus is unremarkable in appearance. Lungs/Pleura: Widespread areas of septal thickening, subpleural reticulation, traction bronchiectasis, peripheral bronchiolectasis and honeycombing are noted in the lungs (right greater than left). These findings have a craniocaudal gradient and are clearly progressive compared to remote prior study from 2010. There is also diffuse bronchial wall thickening with mild to moderate centrilobular and paraseptal emphysema. No acute consolidative airspace disease. No pleural effusions. A few scattered small pulmonary nodules are noted in the lungs bilaterally, largest of which is in the right lower lobe (axial image 94 of series 4) measuring 1.0 x 0.8 cm (mean diameter of 9 mm). Upper Abdomen: Aortic atherosclerosis. Calcified granuloma in the central liver. Right-sided extrarenal pelvis incidentally noted. Musculoskeletal: Chronic appearing compression fracture of L1 with 20% loss of anterior vertebral body height. There are no aggressive appearing lytic or blastic lesions noted in the visualized portions of the skeleton. IMPRESSION: 1. The appearance of the lungs is compatible with interstitial lung disease, and despite the asymmetry of the findings the overall appearance is considered diagnostic of usual interstitial pneumonia (UIP) per current ATS guidelines. 2. Multiple small pulmonary nodules measuring 9 mm or less in size. These are nonspecific, but repeat noncontrast chest CT is suggested in 6 months to ensure the stability or resolution of these findings. 3. Diffuse bronchial wall thickening with mild to moderate centrilobular and paraseptal emphysema; imaging findings suggestive of underlying COPD. 4. Aortic atherosclerosis, in addition to left main and 3 vessel coronary artery  disease. 5. There are calcifications of the aortic valve and mitral annulus. Echocardiographic correlation for evaluation of potential valvular dysfunction may be warranted if clinically indicated. Aortic Atherosclerosis (ICD10-I70.0) and Emphysema (ICD10-J43.9). Electronically Signed   By: Vinnie Langton M.D.   On: 07/22/2020 11:03   DG Chest Port 1 View  Result Date: 07/19/2020 CLINICAL DATA:  Shortness of breath and weakness, history of pneumonia status post antibiotic therapy EXAM: PORTABLE CHEST 1 VIEW COMPARISON:  07/08/2020 FINDINGS: Single frontal view of the chest demonstrates a stable cardiac silhouette. There is progressive interstitial and ground-glass opacity at the lung bases, right greater than left. No effusion or pneumothorax. No acute bony abnormalities. IMPRESSION: 1. Progressive bibasilar interstitial and ground-glass opacities, most pronounced at the right lung base. Findings could reflect progressive atypical pneumonia versus edema. Electronically Signed   By: Randa Ngo M.D.   On: 07/19/2020 17:25   ECHOCARDIOGRAM COMPLETE  Result Date: 07/20/2020    ECHOCARDIOGRAM REPORT   Dylan Name:   AIKEEM Knight Date of Exam: 07/20/2020 Medical Rec #:  [id number]       Height:       66.0 in Accession #:    [id number]      Weight:       140.0 lb Date of Birth:  [birth year]       BSA:          1.719 m Dylan Age:    32 years        BP:           140/80 mmHg Dylan Gender: M  HR:           82 bpm. Exam Location:  [facility] Procedure: 2D Echo Indications:     CHF 428.21/ I50.21  History:         Dylan has prior history of Echocardiogram examinations, most                  recent 01/31/2017.  Sonographer:     Talmadge Chad RN Referring Phys:  [place] Diagnosing Phys: Neoma Laming MD  Sonographer Comments: Technically difficult study due to poor echo windows, suboptimal apical window and suboptimal parasternal window. IMPRESSIONS  1. Left ventricular ejection fraction,  by estimation, is 60 to 65%. The left ventricle has normal function. The left ventricle has no regional wall motion abnormalities. There is mild concentric left ventricular hypertrophy. Left ventricular diastolic parameters are consistent with Grade I diastolic dysfunction (impaired relaxation).  2. Right ventricular systolic function is normal. The right ventricular size is normal.  3. Left atrial size was mildly dilated.  4. The mitral valve is normal in structure. Trivial mitral valve regurgitation. No evidence of mitral stenosis. Severe mitral annular calcification.  5. Tricuspid valve regurgitation is mild to moderate.  6. The aortic valve is calcified. Aortic valve regurgitation is not visualized. Mild aortic valve sclerosis is present, with no evidence of aortic valve stenosis.  7. The inferior vena cava is normal in size with greater than 50% respiratory variability, suggesting right atrial pressure of 3 mmHg. FINDINGS  Left Ventricle: Left ventricular ejection fraction, by estimation, is 60 to 65%. The left ventricle has normal function. The left ventricle has no regional wall motion abnormalities. The left ventricular internal cavity size was normal in size. There is  mild concentric left ventricular hypertrophy. Left ventricular diastolic parameters are consistent with Grade I diastolic dysfunction (impaired relaxation). Right Ventricle: The right ventricular size is normal. No increase in right ventricular wall thickness. Right ventricular systolic function is normal. Left Atrium: Left atrial size was mildly dilated. Right Atrium: Right atrial size was normal in size. Pericardium: There is no evidence of pericardial effusion. Mitral Valve: The mitral valve is normal in structure. Severe mitral annular calcification. Trivial mitral valve regurgitation. No evidence of mitral valve stenosis. Tricuspid Valve: The tricuspid valve is normal in structure. Tricuspid valve regurgitation is mild to moderate. No  evidence of tricuspid stenosis. Aortic Valve: The aortic valve is calcified. Aortic valve regurgitation is not visualized. Mild aortic valve sclerosis is present, with no evidence of aortic valve stenosis. Pulmonic Valve: The pulmonic valve was normal in structure. Pulmonic valve regurgitation is mild. No evidence of pulmonic stenosis. Aorta: The aortic root is normal in size and structure. Venous: The inferior vena cava is normal in size with greater than 50% respiratory variability, suggesting right atrial pressure of 3 mmHg. IAS/Shunts: No atrial level shunt detected by color flow Doppler.  LEFT VENTRICLE PLAX 2D LVIDd:         3.33 cm  Diastology LVIDs:         2.23 cm  LV e' lateral: 15.90 cm/s LV PW:         1.30 cm LV IVS:        1.13 cm LVOT diam:     1.80 cm LVOT Area:     2.54 cm  RIGHT VENTRICLE RV Basal diam:  4.17 cm LEFT ATRIUM         Index LA diam:    2.30 cm 1.34 cm/m   AORTA Ao  Root diam: 3.20 cm TRICUSPID VALVE TV Peak grad:   38.3 mmHg TV Vmax:        3.09 m/s  SHUNTS Systemic Diam: 1.80 cm Neoma Laming MD Electronically signed by Neoma Laming MD Signature Date/Time: 07/20/2020/7:28:51 PM    Final      Time coordinating discharge: Over 85 minutes    Dwyane Dee, MD  [facility] 07/24/2020, 5:50 PM Pager: Secure chat  If 7PM-7AM, please contact night-coverage [email or web address] Password [email or web address]

## 2020-07-24 NOTE — Care Management Important Message (Signed)
Important Message  Patient Details  Name: Dylan Knight MRN: [id number] Date of Birth: [birth year]   Medicare Important Message Given:  Yes     [name] 07/24/2020, 11:04 AM

## 2020-07-24 NOTE — Progress Notes (Signed)
SATURATION QUALIFICATIONS: (This note is used to comply with regulatory documentation for home oxygen)  Patient Saturations on Room Air at Rest = 86%  Patient Saturations on Room Air while Ambulating = 75%  Patient Saturations on 4 Liters of oxygen while Ambulating = 93%  Please briefly explain why patient needs home oxygen: The patient's oxygen level drops to 90% on 3L and stays at around 93% on 4L while ambulating

## 2020-07-24 NOTE — Plan of Care (Addendum)
The patient has been discharged. No falls. Oxygen tank was delivered at the room for the patient be discharged with. Education was performed with the patient and wife. IV has been removed. Oxygen supply company has educated the patient and wife of the use of the oxygen device and have programmed the settings for the patient.  Problem: Education: Goal: Knowledge of General Education information will improve Description: Including pain rating scale, medication(s)/side effects and non-pharmacologic comfort measures Outcome: Completed/Met   Problem: Health Behavior/Discharge Planning: Goal: Ability to manage health-related needs will improve Outcome: Completed/Met   Problem: Clinical Measurements: Goal: Ability to maintain clinical measurements within normal limits will improve Outcome: Completed/Met Goal: Will remain free from infection Outcome: Completed/Met Goal: Diagnostic test results will improve Outcome: Completed/Met Goal: Respiratory complications will improve Outcome: Completed/Met Goal: Cardiovascular complication will be avoided Outcome: Completed/Met   Problem: Activity: Goal: Risk for activity intolerance will decrease Outcome: Completed/Met   Problem: Nutrition: Goal: Adequate nutrition will be maintained Outcome: Completed/Met   Problem: Coping: Goal: Level of anxiety will decrease Outcome: Completed/Met   Problem: Elimination: Goal: Will not experience complications related to bowel motility Outcome: Completed/Met Goal: Will not experience complications related to urinary retention Outcome: Completed/Met   Problem: Pain Managment: Goal: General experience of comfort will improve Outcome: Completed/Met   Problem: Safety: Goal: Ability to remain free from injury will improve Outcome: Completed/Met   Problem: Skin Integrity: Goal: Risk for impaired skin integrity will decrease Outcome: Completed/Met   Problem: Acute Rehab PT Goals(only PT should  resolve) Goal: Patient Will Transfer Sit To/From Stand Outcome: Completed/Met Goal: Pt Will Ambulate Outcome: Completed/Met   Problem: Acute Rehab OT Goals (only OT should resolve) Goal: Pt. Will Perform Grooming Outcome: Completed/Met Goal: Pt. Will Perform Lower Body Dressing Outcome: Completed/Met Goal: Pt. Will Transfer To Toilet Outcome: Completed/Met Goal: Pt. Will Perform Toileting-Clothing Manipulation Outcome: Completed/Met

## 2020-07-24 NOTE — TOC Transition Note (Signed)
Transition of Care [facility]) - CM/SW Discharge Note   Patient Details  Name: Dylan Knight MRN: [id number] Date of Birth: 10-23-1924  Transition of Care [facility]) CM/SW Contact:  Candie Chroman, LCSW Phone Number: 07/24/2020, 2:04 PM   Clinical Narrative:  Patient has orders to discharge home today. [facility] is aware. Ordered oxygen through [employer] to be delivered to the room before he leaves. No further concerns. CSW signing off.   Final next level of care: [place] Barriers to Discharge: Barriers Resolved   Patient Goals and CMS Choice Patient states their goals for this hospitalization and ongoing recovery are:: Get better to return home. CMS Medicare.gov Compare Post Acute Care list provided to:: Patient Represenative (must comment) (Wife- [name]) Choice offered to / list presented to : Patient, Spouse (Spouse shares 1st choice is Radiation protection practitioner.)  Discharge Placement                    Patient and family notified of of transfer: 07/24/20  Discharge Plan and Services   Discharge Planning Services: CM Consult            DME Arranged: Oxygen DME Agency: [employer] Date DME Agency Contacted: 07/24/20   Representative spoke with at DME Agency: [place]: RN, PT, OT [facility] Agency: [facility] [place] Date [facility] Agency Contacted: 07/24/20   Representative spoke with at [place]: [place] ([place]) Interventions     Readmission Risk Interventions No flowsheet data found.

## 2020-07-25 ENCOUNTER — Telehealth: Payer: Self-pay

## 2020-07-25 NOTE — Telephone Encounter (Signed)
Transition Care Management Follow-up Telephone Call  Date of discharge and from where: 07/24/2020, [facility]  How have you been since you were released from the hospital? Patient is doing okay. Still very tired and weak. But overall feeling better.  Any questions or concerns? No  Items Reviewed:  Did the pt receive and understand the discharge instructions provided? Yes   Medications obtained and verified? Yes   Any new allergies since your discharge? No   Dietary orders reviewed? Yes  Do you have support at home? Yes   Functional Questionnaire: (I = Independent and D = Dependent) ADLs: I  Bathing/Dressing- I  Meal Prep- I  Eating- I  Maintaining continence- I  Transferring/Ambulation- I  Managing Meds- I  Follow up appointments reviewed:   PCP Hospital f/u appt confirmed? Yes  Scheduled to see Dr. Damita Dunnings  on 08/01/2020 @ 10:30 am.  [facility] f/u appt confirmed? Yes  Scheduled to see cardiology on 08/06/2020   Are transportation arrangements needed? No   If their condition worsens, is the pt aware to call PCP or go to the Emergency Dept.? Yes  Was the patient provided with contact information for the PCP's office or ED? Yes  Was to pt encouraged to call back with questions or concerns? Yes

## 2020-07-26 ENCOUNTER — Encounter (INDEPENDENT_AMBULATORY_CARE_PROVIDER_SITE_OTHER): Payer: Medicare PPO | Admitting: Ophthalmology

## 2020-07-27 DIAGNOSIS — J849 Interstitial pulmonary disease, unspecified: Secondary | ICD-10-CM | POA: Diagnosis not present

## 2020-07-27 DIAGNOSIS — M1612 Unilateral primary osteoarthritis, left hip: Secondary | ICD-10-CM | POA: Diagnosis not present

## 2020-07-27 DIAGNOSIS — I110 Hypertensive heart disease with heart failure: Secondary | ICD-10-CM | POA: Diagnosis not present

## 2020-07-27 DIAGNOSIS — I2510 Atherosclerotic heart disease of native coronary artery without angina pectoris: Secondary | ICD-10-CM | POA: Diagnosis not present

## 2020-07-27 DIAGNOSIS — I6523 Occlusion and stenosis of bilateral carotid arteries: Secondary | ICD-10-CM | POA: Diagnosis not present

## 2020-07-27 DIAGNOSIS — M5441 Lumbago with sciatica, right side: Secondary | ICD-10-CM | POA: Diagnosis not present

## 2020-07-27 DIAGNOSIS — E1151 Type 2 diabetes mellitus with diabetic peripheral angiopathy without gangrene: Secondary | ICD-10-CM | POA: Diagnosis not present

## 2020-07-27 DIAGNOSIS — J449 Chronic obstructive pulmonary disease, unspecified: Secondary | ICD-10-CM | POA: Diagnosis not present

## 2020-07-27 DIAGNOSIS — I509 Heart failure, unspecified: Secondary | ICD-10-CM | POA: Diagnosis not present

## 2020-07-29 DIAGNOSIS — I2510 Atherosclerotic heart disease of native coronary artery without angina pectoris: Secondary | ICD-10-CM | POA: Diagnosis not present

## 2020-07-29 DIAGNOSIS — M1612 Unilateral primary osteoarthritis, left hip: Secondary | ICD-10-CM | POA: Diagnosis not present

## 2020-07-29 DIAGNOSIS — I6523 Occlusion and stenosis of bilateral carotid arteries: Secondary | ICD-10-CM | POA: Diagnosis not present

## 2020-07-29 DIAGNOSIS — I110 Hypertensive heart disease with heart failure: Secondary | ICD-10-CM | POA: Diagnosis not present

## 2020-07-29 DIAGNOSIS — M5441 Lumbago with sciatica, right side: Secondary | ICD-10-CM | POA: Diagnosis not present

## 2020-07-29 DIAGNOSIS — I509 Heart failure, unspecified: Secondary | ICD-10-CM | POA: Diagnosis not present

## 2020-07-29 DIAGNOSIS — J849 Interstitial pulmonary disease, unspecified: Secondary | ICD-10-CM | POA: Diagnosis not present

## 2020-07-29 DIAGNOSIS — E1151 Type 2 diabetes mellitus with diabetic peripheral angiopathy without gangrene: Secondary | ICD-10-CM | POA: Diagnosis not present

## 2020-07-29 DIAGNOSIS — J449 Chronic obstructive pulmonary disease, unspecified: Secondary | ICD-10-CM | POA: Diagnosis not present

## 2020-07-31 DIAGNOSIS — J449 Chronic obstructive pulmonary disease, unspecified: Secondary | ICD-10-CM | POA: Diagnosis not present

## 2020-07-31 DIAGNOSIS — I2510 Atherosclerotic heart disease of native coronary artery without angina pectoris: Secondary | ICD-10-CM | POA: Diagnosis not present

## 2020-07-31 DIAGNOSIS — M1612 Unilateral primary osteoarthritis, left hip: Secondary | ICD-10-CM | POA: Diagnosis not present

## 2020-07-31 DIAGNOSIS — E1151 Type 2 diabetes mellitus with diabetic peripheral angiopathy without gangrene: Secondary | ICD-10-CM | POA: Diagnosis not present

## 2020-07-31 DIAGNOSIS — I110 Hypertensive heart disease with heart failure: Secondary | ICD-10-CM | POA: Diagnosis not present

## 2020-07-31 DIAGNOSIS — M5441 Lumbago with sciatica, right side: Secondary | ICD-10-CM | POA: Diagnosis not present

## 2020-07-31 DIAGNOSIS — J849 Interstitial pulmonary disease, unspecified: Secondary | ICD-10-CM | POA: Diagnosis not present

## 2020-07-31 DIAGNOSIS — I509 Heart failure, unspecified: Secondary | ICD-10-CM | POA: Diagnosis not present

## 2020-07-31 DIAGNOSIS — I6523 Occlusion and stenosis of bilateral carotid arteries: Secondary | ICD-10-CM | POA: Diagnosis not present

## 2020-08-01 ENCOUNTER — Other Ambulatory Visit: Payer: Self-pay

## 2020-08-01 ENCOUNTER — Encounter: Payer: Self-pay | Admitting: Family Medicine

## 2020-08-01 ENCOUNTER — Ambulatory Visit: Payer: Medicare PPO | Admitting: Family Medicine

## 2020-08-01 VITALS — BP 136/76 | HR 71 | Temp 96.6°F | Ht 66.0 in | Wt 150.3 lb

## 2020-08-01 DIAGNOSIS — R0602 Shortness of breath: Secondary | ICD-10-CM

## 2020-08-01 DIAGNOSIS — J849 Interstitial pulmonary disease, unspecified: Secondary | ICD-10-CM

## 2020-08-01 DIAGNOSIS — Z23 Encounter for immunization: Secondary | ICD-10-CM | POA: Diagnosis not present

## 2020-08-01 DIAGNOSIS — I509 Heart failure, unspecified: Secondary | ICD-10-CM

## 2020-08-01 LAB — BRAIN NATRIURETIC PEPTIDE: Pro B Natriuretic peptide (BNP): 1229 pg/mL — ABNORMAL HIGH (ref 0.0–100.0)

## 2020-08-01 LAB — BASIC METABOLIC PANEL
BUN: 15 mg/dL (ref 6–23)
CO2: 28 mEq/L (ref 19–32)
Calcium: 9.4 mg/dL (ref 8.4–10.5)
Chloride: 99 mEq/L (ref 96–112)
Creatinine, Ser: 1.25 mg/dL (ref 0.40–1.50)
GFR: 53.71 mL/min — ABNORMAL LOW (ref >60.00)
Glucose, Bld: 140 mg/dL — ABNORMAL HIGH (ref 70–99)
Potassium: 4 mEq/L (ref 3.5–5.1)
Sodium: 134 mEq/L — ABNORMAL LOW (ref 135–145)

## 2020-08-01 MED ORDER — FUROSEMIDE 20 MG PO TABS
20.0000 mg | ORAL_TABLET | Freq: Every day | ORAL | 3 refills | Status: DC
Start: 2020-08-01 — End: 2020-09-09

## 2020-08-01 MED ORDER — FUROSEMIDE 20 MG PO TABS: 20.0000 mg | ORAL_TABLET | Freq: Every day | ORAL | Status: DC

## 2020-08-01 NOTE — Patient Instructions (Addendum)
In the meantime, if morning weight is below 147, then take 20mg  of furosemide.  If 147 or above, then take 40mg  that day.    If you have mychart we'll likely use that to update you.     We'll go from there. Take care.  Glad to see you.  Keep working on PT.

## 2020-08-01 NOTE — Progress Notes (Signed)
This visit occurred during the SARS-CoV-2 public health emergency.  Safety protocols were in place, including screening questions prior to the visit, additional usage of staff PPE, and extensive cleaning of exam room while observing appropriate contact time as indicated for disinfecting solutions.  Inpatient f/u.   ======================  Admit date: 07/19/2020 Discharge date: 07/24/2020  Admitted From: home Disposition:  home Discharging physician: Dwyane Dee, MD  Recommendations for Outpatient Follow-up:  1. Follow-up with pulmonology  Home Health: RN, PT, OT Equipment/Devices: Home O2 set up at discharge  Patient discharged to home in Discharge Condition: stable CODE STATUS: DNR     Hospital Course: Mr. Soules is a 86 yo CM with DM 2, HTN, CAD, CVD and PVD was admitted 07/19/2020 with increasing shortness of breath. Of note he had recently been treated for CAP as an outpatient with some interval improvement in symptoms until he developed markedly worsening shortness of breath and lower extremity edema. Chest x-ray revealed "progressive bibasilar interstitial and groundglass opacities suggestive of atypical pneumonia versus edema". BNP was 540. Patient has been treated with Lasix 40 twice daily with excellent diuresis. Echocardiogram revealed preserved EF with mild concentric LVH. Pulmonology was consulted for further recommendations.  After pulmonology consultation, his CT was considered consistent with early signs of pulmonary fibrosis.  He was recommended for outpatient follow-up and conservative measures at this time.  No indication or need for steroids nor antifibrotic therapy at this time.  He was prescribed an albuterol inhaler at discharge. He also remained hypoxic requiring oxygen which was also arranged prior to discharge.   The patient's chronic medical conditions were treated accordingly per the patient's home medication regimen except as noted.  On day of  discharge, patient was felt deemed stable for discharge. Patient/family member advised to call PCP or come back to ER if needed.   Discharge Diagnoses:   Principal Diagnosis: CHF exacerbation ([place])  ====================== Admitted with shortness of breath after previous treatment for community-acquired pneumonia.  Had elevated BNP and lower extremity edema noted.  Responded to diuresis as he was treated for heart failure.  He had pulmonary evaluation for consideration of early pulmonary fibrosis.  Has new oxygen requirement.  Improved to the point of discharge now on home O2.  Inpatient course discussed with patient and wife.  SOB is better, still on O2.  O2 clearly makes him feel better.   Has BLE edema.  Weight 144---->150 today.   He has [facility].    Flu shot done at office visit.  He had recently scraped L wrist with walker.  Dressed at [employer].   Meds, vitals, and allergies reviewed.   ROS: Per HPI unless specifically indicated in ROS section   nad ncat Neck supple, no LA ctab rrr Abdomen soft.  Not tender.  Normal bowel sounds 1+ BLE edema.   Left wrist with superficial abrasion without spreading erythema.  Dressed at office visit with nonstick dressing.  No complication.  At least 30 minutes were devoted to patient care in this encounter (this can potentially include time spent reviewing the patient's file/history, interviewing and examining the patient, counseling/reviewing plan with patient, ordering referrals, ordering tests, reviewing relevant laboratory or x-ray data, and documenting the encounter).

## 2020-08-02 DIAGNOSIS — J849 Interstitial pulmonary disease, unspecified: Secondary | ICD-10-CM | POA: Diagnosis not present

## 2020-08-02 DIAGNOSIS — J449 Chronic obstructive pulmonary disease, unspecified: Secondary | ICD-10-CM | POA: Diagnosis not present

## 2020-08-02 DIAGNOSIS — M1612 Unilateral primary osteoarthritis, left hip: Secondary | ICD-10-CM | POA: Diagnosis not present

## 2020-08-02 DIAGNOSIS — I509 Heart failure, unspecified: Secondary | ICD-10-CM | POA: Diagnosis not present

## 2020-08-02 DIAGNOSIS — I2510 Atherosclerotic heart disease of native coronary artery without angina pectoris: Secondary | ICD-10-CM | POA: Diagnosis not present

## 2020-08-02 DIAGNOSIS — M5441 Lumbago with sciatica, right side: Secondary | ICD-10-CM | POA: Diagnosis not present

## 2020-08-02 DIAGNOSIS — E1151 Type 2 diabetes mellitus with diabetic peripheral angiopathy without gangrene: Secondary | ICD-10-CM | POA: Diagnosis not present

## 2020-08-02 DIAGNOSIS — I6523 Occlusion and stenosis of bilateral carotid arteries: Secondary | ICD-10-CM | POA: Diagnosis not present

## 2020-08-02 DIAGNOSIS — I110 Hypertensive heart disease with heart failure: Secondary | ICD-10-CM | POA: Diagnosis not present

## 2020-08-05 DIAGNOSIS — M1612 Unilateral primary osteoarthritis, left hip: Secondary | ICD-10-CM | POA: Diagnosis not present

## 2020-08-05 DIAGNOSIS — J449 Chronic obstructive pulmonary disease, unspecified: Secondary | ICD-10-CM | POA: Diagnosis not present

## 2020-08-05 DIAGNOSIS — J849 Interstitial pulmonary disease, unspecified: Secondary | ICD-10-CM | POA: Diagnosis not present

## 2020-08-05 DIAGNOSIS — I6523 Occlusion and stenosis of bilateral carotid arteries: Secondary | ICD-10-CM | POA: Diagnosis not present

## 2020-08-05 DIAGNOSIS — E1151 Type 2 diabetes mellitus with diabetic peripheral angiopathy without gangrene: Secondary | ICD-10-CM | POA: Diagnosis not present

## 2020-08-05 DIAGNOSIS — M5441 Lumbago with sciatica, right side: Secondary | ICD-10-CM | POA: Diagnosis not present

## 2020-08-05 DIAGNOSIS — I110 Hypertensive heart disease with heart failure: Secondary | ICD-10-CM | POA: Diagnosis not present

## 2020-08-05 DIAGNOSIS — I2510 Atherosclerotic heart disease of native coronary artery without angina pectoris: Secondary | ICD-10-CM | POA: Diagnosis not present

## 2020-08-05 DIAGNOSIS — I509 Heart failure, unspecified: Secondary | ICD-10-CM | POA: Diagnosis not present

## 2020-08-05 NOTE — Assessment & Plan Note (Signed)
With weight increased from 144 to 150 pounds today.  Still on O2.  Still okay for outpatient follow-up.  Medication use discussed with patient.  Reasonable for him to see pulmonary as an outpatient.  Referred.  We talked about dry weight and weight dependent Lasix dosing.  If weight is at or above 147 pounds then he will take an extra Lasix per day.  See notes on follow-up labs.  Flu shot done at office visit.

## 2020-08-05 NOTE — Progress Notes (Signed)
Patient ID: Dylan Knight, male    DOB: [birth year], 86 y.o.   MRN: [id number]  HPI  Dylan Knight is a 86 y/o male with a history of DM, hyperlipidemia, HTN, stroke, thyroid disease, PAD, COPD, pulmonary fibrosis, previous tobacco use and chronic heart failure.   Echo report from 07/20/20 reviewed and showed an EF of 60-65% along with mild LVH and mild/moderate TR.   Admitted 07/19/20 due to HF exacerbation. Recently treated for pneumonia. Pulmonology consult obtained. Chest CT done which showed pulmonary fibrosis. Given lasix and albuterol inhaler. Unable to be weaned off oxygen. Discharged after 5 days.   He presents today for his initial visit with a chief complaint of moderate shortness of breath upon minimal exertion. He describes this as having been present for several months. He has associated fatigue, blurry vision (due to macular degeneration), pedal edema, light-headedness, difficulty sleeping and chronic back pain along with this. He denies any abdominal distention, palpitations, chest pain, cough or weight gain.   Takes an additional furosemide if weight goes above 147 pounds at home. Hasn't had to take any extra furosemide recently.   Past Medical History:  Diagnosis Date  . Arthritis    left hip  . BPH (benign prostatic hyperplasia)   . Cancer [facility])    Skin cancers removed  . Carotid artery occlusion   . CHF (congestive heart failure) ([place])   . COPD (chronic obstructive pulmonary disease) ([place])   . Diabetes mellitus without complication ([place])   . Hyperlipidemia   . Hypertension   . Hypothyroidism   . Macular degeneration   . Peripheral arterial disease ([place]) 2002  . Pulmonary fibrosis ([place])   . Stroke [facility]) 2005   Past Surgical History:  Procedure Laterality Date  . ABDOMINAL AORTIC ANEURYSM REPAIR  2003   Aorto-left femoral-right iliac BPG by Dr. Scot Dock  . Aortic aneursym surgery     2003- Dr. Scot Dock  . BACK SURGERY     1960  . CAROTID ENDARTERECTOMY  Jan.  25,2007   Right CEA  . Carotid stenosis surgery on right     2007  . CARPAL TUNNEL RELEASE Left 06/02/2016   Procedure: LEFT CARPAL TUNNEL RELEASE;  Surgeon: Daryll Brod, MD;  Location: [place];  Service: Orthopedics;  Laterality: Left;  . EYE SURGERY     Catarct surgery and lens implant - bilateral  . PR VEIN BYPASS GRAFT,AORTO-FEM-POP  2005   Revision Right Fem-pop  . PR VEIN BYPASS GRAFT,AORTO-FEM-POP  2002   Right Fem-pop  . RADIOLOGY WITH ANESTHESIA N/A 12/02/2016   Procedure: MRI LUMBAR SPINE WITHOUT;  Surgeon: Medication Radiologist, MD;  Location: [place];  Service: Radiology;  Laterality: N/A;  . Right leg bypass     2002 -  and amputation of right fifth toe  . SPINE SURGERY  1960   lumbar spine  . [place], 2007   cervical spine  . TOE AMPUTATION  08-2001   right 5th toe amputation   Family History  Problem Relation Age of Onset  . Stroke Mother   . Hypertension Mother   . Heart disease Father   . Heart attack Father   . Cancer Brother   . Hypertension Brother    Social History   Tobacco Use  . Smoking status: Former Smoker    Packs/day: 1.00    Years: 63.00    Pack years: 63.00    Types: Cigarettes    Quit date: 04/09/2004  Years since quitting: 16.3  . Smokeless tobacco: Former Systems developer  . Tobacco comment: Quit smoking in 2005  Substance Use Topics  . Alcohol use: No    Alcohol/week: 0.0 standard drinks   Allergies  Allergen Reactions  . Iohexol Hives and Other (See Comments)    Went away with benadryl   . Iodine Rash   Prior to Admission medications   Medication Sig Start Date End Date Taking? Authorizing Provider  acetaminophen (TYLENOL) 325 MG tablet Take 650 mg by mouth every 6 (six) hours as needed for mild pain.    Yes [provider]  albuterol (VENTOLIN HFA) 108 (90 Base) MCG/ACT inhaler Inhale 2 puffs into the lungs every 6 (six) hours as needed for wheezing or shortness of breath. 07/24/20  Yes Dwyane Dee,  MD  atenolol (TENORMIN) 50 MG tablet Take 1 tablet (50 mg total) by mouth daily. 07/25/20  Yes Dwyane Dee, MD  clopidogrel (PLAVIX) 75 MG tablet TAKE 1 TABLET (75MG ) BY MOUTH EVERY DAY 12/08/19  Yes Tonia Ghent, MD  clotrimazole-betamethasone (LOTRISONE) cream APPLY TO AFFECTED AREA TWICE A DAY 06/04/20  Yes Tonia Ghent, MD  ezetimibe-simvastatin (VYTORIN) 10-40 MG tablet TAKE 1 TABLET BY MOUTH EVERY DAY 05/19/20  Yes Tonia Ghent, MD  furosemide (LASIX) 20 MG tablet Take 1 tablet (20 mg total) by mouth daily. Take an extra tab if weight is 147 or above in the AM. 08/01/20  Yes Tonia Ghent, MD  guaiFENesin (MUCINEX) 600 MG 12 hr tablet Take 600 mg by mouth 2 (two) times daily.    Yes [provider]  levothyroxine (SYNTHROID) 25 MCG tablet Take 2 tablets (50 mcg total) by mouth daily before breakfast. 03/29/20  Yes Tonia Ghent, MD  Multiple Vitamins-Minerals (CENTRUM SILVER PO) Take 1 tablet by mouth daily.   Yes [provider]  sitaGLIPtin-metformin (JANUMET) 50-500 MG tablet Take 1 tablet by mouth daily.   Yes [provider]    Review of Systems  Constitutional: Positive for fatigue. Negative for appetite change.  HENT: Positive for hearing loss and rhinorrhea. Negative for congestion and sore throat.   Eyes: Positive for visual disturbance (left eye macular degeneration).  Respiratory: Positive for shortness of breath (with miminal exertion). Negative for cough and chest tightness.   Cardiovascular: Positive for leg swelling. Negative for chest pain and palpitations.  Gastrointestinal: Negative for abdominal distention and abdominal pain.  Endocrine: Negative.   Genitourinary: Negative.   Musculoskeletal: Positive for back pain. Negative for neck pain.  Skin: Negative.   Allergic/Immunologic: Negative.   Neurological: Positive for light-headedness. Negative for dizziness.  Hematological: Negative for adenopathy. Does not bruise/bleed  easily.  Psychiatric/Behavioral: Positive for sleep disturbance (wearing oxygen). Negative for dysphoric mood. The patient is not nervous/anxious.     Vitals:   08/06/20 1117  BP: (!) 97/55  Pulse: 65  Resp: 16  SpO2: 100%  Weight: 147 lb (66.7 kg)  Height: 5\' 6"  (1.676 m)   Wt Readings from Last 3 Encounters:  08/06/20 147 lb (66.7 kg)  08/01/20 150 lb 5 oz (68.2 kg)  07/22/20 148 lb 5.9 oz (67.3 kg)   Lab Results  Component Value Date   CREATININE 1.25 08/01/2020   CREATININE 1.18 07/24/2020   CREATININE 1.20 07/23/2020    Physical Exam Vitals and nursing note reviewed.  Constitutional:      Appearance: He is well-developed.  HENT:     Right Ear: Decreased hearing noted.     Left  Ear: Decreased hearing noted.  Cardiovascular:     Rate and Rhythm: Normal rate and regular rhythm.  Pulmonary:     Effort: Pulmonary effort is normal. No respiratory distress.     Breath sounds: No rhonchi or rales.  Abdominal:     Palpations: Abdomen is soft.     Tenderness: There is no abdominal tenderness.  Musculoskeletal:     Cervical back: Normal range of motion and neck supple.     Right lower leg: No tenderness. Edema (1+ pitting) present.     Left lower leg: No tenderness. Edema (2+ pitting) present.  Skin:    General: Skin is warm and dry.  Neurological:     General: No focal deficit present.     Mental Status: He is alert and oriented to person, place, and time.  Psychiatric:        Mood and Affect: Mood normal.        Behavior: Behavior normal.    Assessment & Plan:  1: Chronic heart failure with preserved ejection fraction with structural changes- - NYHA class III - euvolemic today - weighing daily and he was reminded to call for an overnight weight gain of > 2 pounds or a weekly weight gain of > 5 pounds - not adding salt and wife tries to read food labels; reviewed the importance of following a low sodium diet - BP will not tolerate adding entresto - has PT  coming out twice weekly along with nursing once/ week - pro-BNP on 08/01/20 was 1229.0 - reports receiving both pfizer covid vaccines along with flu vaccine  2: HTN- - BP on the low side today; this will limit diuretic usage - saw PCP Damita Dunnings) 08/01/20 - BMP 08/01/20 reviewed showed sodium 134, potassium 4.0, creatinine 1.25 and GFR 53.71  3: Pulmonary fibrosis- - saw pulmonology ([place]) 05/04/2019 - currently wearing oxygen at 4L around the clock but wants to come off of it since he wasn't wearing it prior to recent admission - checks his oxygen level at home; advised that it needed to stay above 90% at rest and exertion; if it does, he can try reducing it to 3L and then see if he can maintain his oxygen level; if he's able to keep his oxygen level above 90%, he can then reduce it to 2L  4: Lymphedema- - stage 2 - limited in his ability to exercise due to shortness of breath and oxygen tubing - doesn't elevate his legs like he should; encouraged him to elevate his legs when sitting for long periods of time - encouraged him to get compression socks and put them on every morning with removal at bedtime - consider compression boots if edema persists   Medication bottles were reviewed.   Return in 1 month or sooner for any questions/problems before then.

## 2020-08-06 ENCOUNTER — Other Ambulatory Visit: Payer: Self-pay

## 2020-08-06 ENCOUNTER — Ambulatory Visit: Payer: Medicare PPO | Attending: Family | Admitting: Family

## 2020-08-06 ENCOUNTER — Encounter: Payer: Self-pay | Admitting: Family

## 2020-08-06 VITALS — BP 97/55 | HR 65 | Resp 16 | Ht 66.0 in | Wt 147.0 lb

## 2020-08-06 DIAGNOSIS — I10 Essential (primary) hypertension: Secondary | ICD-10-CM

## 2020-08-06 DIAGNOSIS — H538 Other visual disturbances: Secondary | ICD-10-CM | POA: Insufficient documentation

## 2020-08-06 DIAGNOSIS — E785 Hyperlipidemia, unspecified: Secondary | ICD-10-CM | POA: Diagnosis not present

## 2020-08-06 DIAGNOSIS — J8410 Pulmonary fibrosis, unspecified: Secondary | ICD-10-CM | POA: Insufficient documentation

## 2020-08-06 DIAGNOSIS — I890 Lymphedema, not elsewhere classified: Secondary | ICD-10-CM | POA: Diagnosis not present

## 2020-08-06 DIAGNOSIS — G8929 Other chronic pain: Secondary | ICD-10-CM | POA: Diagnosis not present

## 2020-08-06 DIAGNOSIS — H3530 Unspecified macular degeneration: Secondary | ICD-10-CM | POA: Diagnosis not present

## 2020-08-06 DIAGNOSIS — E1151 Type 2 diabetes mellitus with diabetic peripheral angiopathy without gangrene: Secondary | ICD-10-CM | POA: Insufficient documentation

## 2020-08-06 DIAGNOSIS — Z8673 Personal history of transient ischemic attack (TIA), and cerebral infarction without residual deficits: Secondary | ICD-10-CM | POA: Insufficient documentation

## 2020-08-06 DIAGNOSIS — Z87891 Personal history of nicotine dependence: Secondary | ICD-10-CM | POA: Diagnosis not present

## 2020-08-06 DIAGNOSIS — E039 Hypothyroidism, unspecified: Secondary | ICD-10-CM | POA: Diagnosis not present

## 2020-08-06 DIAGNOSIS — G479 Sleep disorder, unspecified: Secondary | ICD-10-CM | POA: Insufficient documentation

## 2020-08-06 DIAGNOSIS — Z79899 Other long term (current) drug therapy: Secondary | ICD-10-CM | POA: Insufficient documentation

## 2020-08-06 DIAGNOSIS — Z7984 Long term (current) use of oral hypoglycemic drugs: Secondary | ICD-10-CM | POA: Insufficient documentation

## 2020-08-06 DIAGNOSIS — J449 Chronic obstructive pulmonary disease, unspecified: Secondary | ICD-10-CM | POA: Insufficient documentation

## 2020-08-06 DIAGNOSIS — I110 Hypertensive heart disease with heart failure: Secondary | ICD-10-CM | POA: Insufficient documentation

## 2020-08-06 DIAGNOSIS — I5032 Chronic diastolic (congestive) heart failure: Secondary | ICD-10-CM | POA: Diagnosis not present

## 2020-08-06 DIAGNOSIS — M1990 Unspecified osteoarthritis, unspecified site: Secondary | ICD-10-CM | POA: Insufficient documentation

## 2020-08-06 DIAGNOSIS — Z8249 Family history of ischemic heart disease and other diseases of the circulatory system: Secondary | ICD-10-CM | POA: Insufficient documentation

## 2020-08-06 DIAGNOSIS — J849 Interstitial pulmonary disease, unspecified: Secondary | ICD-10-CM

## 2020-08-06 DIAGNOSIS — R0602 Shortness of breath: Secondary | ICD-10-CM | POA: Insufficient documentation

## 2020-08-06 DIAGNOSIS — M549 Dorsalgia, unspecified: Secondary | ICD-10-CM | POA: Diagnosis not present

## 2020-08-06 DIAGNOSIS — R42 Dizziness and giddiness: Secondary | ICD-10-CM | POA: Diagnosis not present

## 2020-08-06 NOTE — Patient Instructions (Addendum)
Continue weighing daily and call for an overnight weight gain of > 2 pounds or a weekly weight gain of >5 pounds.   Get compression socks and put them on each morning and take them off each evening.

## 2020-08-09 DIAGNOSIS — I6523 Occlusion and stenosis of bilateral carotid arteries: Secondary | ICD-10-CM | POA: Diagnosis not present

## 2020-08-09 DIAGNOSIS — M1612 Unilateral primary osteoarthritis, left hip: Secondary | ICD-10-CM | POA: Diagnosis not present

## 2020-08-09 DIAGNOSIS — I110 Hypertensive heart disease with heart failure: Secondary | ICD-10-CM | POA: Diagnosis not present

## 2020-08-09 DIAGNOSIS — J849 Interstitial pulmonary disease, unspecified: Secondary | ICD-10-CM | POA: Diagnosis not present

## 2020-08-09 DIAGNOSIS — E1151 Type 2 diabetes mellitus with diabetic peripheral angiopathy without gangrene: Secondary | ICD-10-CM | POA: Diagnosis not present

## 2020-08-09 DIAGNOSIS — I2510 Atherosclerotic heart disease of native coronary artery without angina pectoris: Secondary | ICD-10-CM | POA: Diagnosis not present

## 2020-08-09 DIAGNOSIS — J449 Chronic obstructive pulmonary disease, unspecified: Secondary | ICD-10-CM | POA: Diagnosis not present

## 2020-08-09 DIAGNOSIS — I509 Heart failure, unspecified: Secondary | ICD-10-CM | POA: Diagnosis not present

## 2020-08-09 DIAGNOSIS — M5441 Lumbago with sciatica, right side: Secondary | ICD-10-CM | POA: Diagnosis not present

## 2020-08-12 DIAGNOSIS — Z9181 History of falling: Secondary | ICD-10-CM

## 2020-08-12 DIAGNOSIS — Z9981 Dependence on supplemental oxygen: Secondary | ICD-10-CM

## 2020-08-12 DIAGNOSIS — I110 Hypertensive heart disease with heart failure: Secondary | ICD-10-CM | POA: Diagnosis not present

## 2020-08-12 DIAGNOSIS — J449 Chronic obstructive pulmonary disease, unspecified: Secondary | ICD-10-CM | POA: Diagnosis not present

## 2020-08-12 DIAGNOSIS — E039 Hypothyroidism, unspecified: Secondary | ICD-10-CM

## 2020-08-12 DIAGNOSIS — I6523 Occlusion and stenosis of bilateral carotid arteries: Secondary | ICD-10-CM | POA: Diagnosis not present

## 2020-08-12 DIAGNOSIS — J849 Interstitial pulmonary disease, unspecified: Secondary | ICD-10-CM | POA: Diagnosis not present

## 2020-08-12 DIAGNOSIS — Z7902 Long term (current) use of antithrombotics/antiplatelets: Secondary | ICD-10-CM

## 2020-08-12 DIAGNOSIS — Z87891 Personal history of nicotine dependence: Secondary | ICD-10-CM

## 2020-08-12 DIAGNOSIS — E785 Hyperlipidemia, unspecified: Secondary | ICD-10-CM

## 2020-08-12 DIAGNOSIS — Z8701 Personal history of pneumonia (recurrent): Secondary | ICD-10-CM

## 2020-08-12 DIAGNOSIS — N400 Enlarged prostate without lower urinary tract symptoms: Secondary | ICD-10-CM

## 2020-08-12 DIAGNOSIS — M5441 Lumbago with sciatica, right side: Secondary | ICD-10-CM | POA: Diagnosis not present

## 2020-08-12 DIAGNOSIS — H3530 Unspecified macular degeneration: Secondary | ICD-10-CM

## 2020-08-12 DIAGNOSIS — Z7984 Long term (current) use of oral hypoglycemic drugs: Secondary | ICD-10-CM

## 2020-08-12 DIAGNOSIS — I509 Heart failure, unspecified: Secondary | ICD-10-CM | POA: Diagnosis not present

## 2020-08-12 DIAGNOSIS — E1151 Type 2 diabetes mellitus with diabetic peripheral angiopathy without gangrene: Secondary | ICD-10-CM | POA: Diagnosis not present

## 2020-08-12 DIAGNOSIS — I2510 Atherosclerotic heart disease of native coronary artery without angina pectoris: Secondary | ICD-10-CM | POA: Diagnosis not present

## 2020-08-12 DIAGNOSIS — G4700 Insomnia, unspecified: Secondary | ICD-10-CM

## 2020-08-12 DIAGNOSIS — Z8673 Personal history of transient ischemic attack (TIA), and cerebral infarction without residual deficits: Secondary | ICD-10-CM

## 2020-08-12 DIAGNOSIS — Z85828 Personal history of other malignant neoplasm of skin: Secondary | ICD-10-CM

## 2020-08-12 DIAGNOSIS — M1612 Unilateral primary osteoarthritis, left hip: Secondary | ICD-10-CM | POA: Diagnosis not present

## 2020-08-12 DIAGNOSIS — Z89421 Acquired absence of other right toe(s): Secondary | ICD-10-CM

## 2020-08-14 DIAGNOSIS — I509 Heart failure, unspecified: Secondary | ICD-10-CM | POA: Diagnosis not present

## 2020-08-14 DIAGNOSIS — J449 Chronic obstructive pulmonary disease, unspecified: Secondary | ICD-10-CM | POA: Diagnosis not present

## 2020-08-14 DIAGNOSIS — I2510 Atherosclerotic heart disease of native coronary artery without angina pectoris: Secondary | ICD-10-CM | POA: Diagnosis not present

## 2020-08-14 DIAGNOSIS — J849 Interstitial pulmonary disease, unspecified: Secondary | ICD-10-CM | POA: Diagnosis not present

## 2020-08-14 DIAGNOSIS — I6523 Occlusion and stenosis of bilateral carotid arteries: Secondary | ICD-10-CM | POA: Diagnosis not present

## 2020-08-14 DIAGNOSIS — E1151 Type 2 diabetes mellitus with diabetic peripheral angiopathy without gangrene: Secondary | ICD-10-CM | POA: Diagnosis not present

## 2020-08-14 DIAGNOSIS — M5441 Lumbago with sciatica, right side: Secondary | ICD-10-CM | POA: Diagnosis not present

## 2020-08-14 DIAGNOSIS — M1612 Unilateral primary osteoarthritis, left hip: Secondary | ICD-10-CM | POA: Diagnosis not present

## 2020-08-14 DIAGNOSIS — I110 Hypertensive heart disease with heart failure: Secondary | ICD-10-CM | POA: Diagnosis not present

## 2020-08-16 ENCOUNTER — Telehealth: Payer: Self-pay

## 2020-08-16 NOTE — Telephone Encounter (Signed)
Please give the order.  Thanks.   

## 2020-08-16 NOTE — Telephone Encounter (Signed)
Dylan Knight with [facility] advised.

## 2020-08-16 NOTE — Telephone Encounter (Signed)
Colletta Maryland with [facility] left a VM on triage line asking for verbal orders for Speech Therapy Evaluation. Please call orders to [phone].

## 2020-08-19 ENCOUNTER — Encounter (INDEPENDENT_AMBULATORY_CARE_PROVIDER_SITE_OTHER): Payer: Medicare PPO | Admitting: Ophthalmology

## 2020-08-20 DIAGNOSIS — I2510 Atherosclerotic heart disease of native coronary artery without angina pectoris: Secondary | ICD-10-CM | POA: Diagnosis not present

## 2020-08-20 DIAGNOSIS — E1151 Type 2 diabetes mellitus with diabetic peripheral angiopathy without gangrene: Secondary | ICD-10-CM | POA: Diagnosis not present

## 2020-08-20 DIAGNOSIS — M5441 Lumbago with sciatica, right side: Secondary | ICD-10-CM | POA: Diagnosis not present

## 2020-08-20 DIAGNOSIS — J849 Interstitial pulmonary disease, unspecified: Secondary | ICD-10-CM | POA: Diagnosis not present

## 2020-08-20 DIAGNOSIS — M1612 Unilateral primary osteoarthritis, left hip: Secondary | ICD-10-CM | POA: Diagnosis not present

## 2020-08-20 DIAGNOSIS — J449 Chronic obstructive pulmonary disease, unspecified: Secondary | ICD-10-CM | POA: Diagnosis not present

## 2020-08-20 DIAGNOSIS — I110 Hypertensive heart disease with heart failure: Secondary | ICD-10-CM | POA: Diagnosis not present

## 2020-08-20 DIAGNOSIS — I6523 Occlusion and stenosis of bilateral carotid arteries: Secondary | ICD-10-CM | POA: Diagnosis not present

## 2020-08-20 DIAGNOSIS — I509 Heart failure, unspecified: Secondary | ICD-10-CM | POA: Diagnosis not present

## 2020-08-21 ENCOUNTER — Telehealth: Payer: Self-pay | Admitting: *Deleted

## 2020-08-21 DIAGNOSIS — M1612 Unilateral primary osteoarthritis, left hip: Secondary | ICD-10-CM | POA: Diagnosis not present

## 2020-08-21 DIAGNOSIS — I6523 Occlusion and stenosis of bilateral carotid arteries: Secondary | ICD-10-CM | POA: Diagnosis not present

## 2020-08-21 DIAGNOSIS — J849 Interstitial pulmonary disease, unspecified: Secondary | ICD-10-CM | POA: Diagnosis not present

## 2020-08-21 DIAGNOSIS — M5441 Lumbago with sciatica, right side: Secondary | ICD-10-CM | POA: Diagnosis not present

## 2020-08-21 DIAGNOSIS — E1151 Type 2 diabetes mellitus with diabetic peripheral angiopathy without gangrene: Secondary | ICD-10-CM | POA: Diagnosis not present

## 2020-08-21 DIAGNOSIS — J449 Chronic obstructive pulmonary disease, unspecified: Secondary | ICD-10-CM | POA: Diagnosis not present

## 2020-08-21 DIAGNOSIS — I110 Hypertensive heart disease with heart failure: Secondary | ICD-10-CM | POA: Diagnosis not present

## 2020-08-21 DIAGNOSIS — I509 Heart failure, unspecified: Secondary | ICD-10-CM | POA: Diagnosis not present

## 2020-08-21 DIAGNOSIS — I2510 Atherosclerotic heart disease of native coronary artery without angina pectoris: Secondary | ICD-10-CM | POA: Diagnosis not present

## 2020-08-21 NOTE — Telephone Encounter (Signed)
When did they send the request?  If it was sent this week I have not been able to get to it yet since I have been out of clinic.  If it is at the clinic right now I will work on it as quickly as I can but it may be next week before I can complete it, based on my return time to clinic.

## 2020-08-21 NOTE — Telephone Encounter (Signed)
Dylan Knight with Dylan Knight left a voicemail stating that they sent over a CMN for Oxygen for the patient. Dylan Knight stated that she is calling to make sure that you received it and the status of it being returned to them? When calling back [phone] ext [phone]

## 2020-08-22 DIAGNOSIS — M5441 Lumbago with sciatica, right side: Secondary | ICD-10-CM | POA: Diagnosis not present

## 2020-08-22 DIAGNOSIS — E1151 Type 2 diabetes mellitus with diabetic peripheral angiopathy without gangrene: Secondary | ICD-10-CM | POA: Diagnosis not present

## 2020-08-22 DIAGNOSIS — J849 Interstitial pulmonary disease, unspecified: Secondary | ICD-10-CM | POA: Diagnosis not present

## 2020-08-22 DIAGNOSIS — I509 Heart failure, unspecified: Secondary | ICD-10-CM | POA: Diagnosis not present

## 2020-08-22 DIAGNOSIS — I6523 Occlusion and stenosis of bilateral carotid arteries: Secondary | ICD-10-CM | POA: Diagnosis not present

## 2020-08-22 DIAGNOSIS — I110 Hypertensive heart disease with heart failure: Secondary | ICD-10-CM | POA: Diagnosis not present

## 2020-08-22 DIAGNOSIS — M1612 Unilateral primary osteoarthritis, left hip: Secondary | ICD-10-CM | POA: Diagnosis not present

## 2020-08-22 DIAGNOSIS — J449 Chronic obstructive pulmonary disease, unspecified: Secondary | ICD-10-CM | POA: Diagnosis not present

## 2020-08-22 DIAGNOSIS — I2510 Atherosclerotic heart disease of native coronary artery without angina pectoris: Secondary | ICD-10-CM | POA: Diagnosis not present

## 2020-08-23 DIAGNOSIS — J9601 Acute respiratory failure with hypoxia: Secondary | ICD-10-CM | POA: Diagnosis not present

## 2020-08-23 DIAGNOSIS — J849 Interstitial pulmonary disease, unspecified: Secondary | ICD-10-CM | POA: Diagnosis not present

## 2020-08-23 DIAGNOSIS — I509 Heart failure, unspecified: Secondary | ICD-10-CM | POA: Diagnosis not present

## 2020-08-23 NOTE — Telephone Encounter (Signed)
I'm not sure but I believe this request was in the paperwork from last week.

## 2020-08-26 ENCOUNTER — Other Ambulatory Visit: Payer: Self-pay | Admitting: Family

## 2020-08-26 ENCOUNTER — Other Ambulatory Visit: Payer: Self-pay

## 2020-08-26 ENCOUNTER — Telehealth: Payer: Self-pay | Admitting: Family

## 2020-08-26 ENCOUNTER — Ambulatory Visit: Payer: Medicare PPO | Admitting: Family

## 2020-08-26 ENCOUNTER — Ambulatory Visit
Admission: RE | Admit: 2020-08-26 | Discharge: 2020-08-26 | Disposition: A | Discharge status: Home / Self Care | Payer: Medicare PPO | Source: Ambulatory Visit | Attending: Family | Admitting: Family

## 2020-08-26 ENCOUNTER — Encounter: Payer: Self-pay | Admitting: Family

## 2020-08-26 VITALS — BP 115/64 | HR 78 | Resp 16 | Ht 66.0 in | Wt 151.6 lb

## 2020-08-26 DIAGNOSIS — Z7902 Long term (current) use of antithrombotics/antiplatelets: Secondary | ICD-10-CM | POA: Diagnosis not present

## 2020-08-26 DIAGNOSIS — I5033 Acute on chronic diastolic (congestive) heart failure: Secondary | ICD-10-CM | POA: Diagnosis not present

## 2020-08-26 DIAGNOSIS — I10 Essential (primary) hypertension: Secondary | ICD-10-CM

## 2020-08-26 DIAGNOSIS — I890 Lymphedema, not elsewhere classified: Secondary | ICD-10-CM

## 2020-08-26 DIAGNOSIS — E785 Hyperlipidemia, unspecified: Secondary | ICD-10-CM | POA: Diagnosis not present

## 2020-08-26 DIAGNOSIS — Z89421 Acquired absence of other right toe(s): Secondary | ICD-10-CM | POA: Diagnosis not present

## 2020-08-26 DIAGNOSIS — R0989 Other specified symptoms and signs involving the circulatory and respiratory systems: Secondary | ICD-10-CM | POA: Insufficient documentation

## 2020-08-26 DIAGNOSIS — R21 Rash and other nonspecific skin eruption: Secondary | ICD-10-CM | POA: Diagnosis not present

## 2020-08-26 DIAGNOSIS — Z79890 Hormone replacement therapy (postmenopausal): Secondary | ICD-10-CM | POA: Insufficient documentation

## 2020-08-26 DIAGNOSIS — Z79899 Other long term (current) drug therapy: Secondary | ICD-10-CM | POA: Insufficient documentation

## 2020-08-26 DIAGNOSIS — J449 Chronic obstructive pulmonary disease, unspecified: Secondary | ICD-10-CM | POA: Insufficient documentation

## 2020-08-26 DIAGNOSIS — E039 Hypothyroidism, unspecified: Secondary | ICD-10-CM | POA: Insufficient documentation

## 2020-08-26 DIAGNOSIS — Z8673 Personal history of transient ischemic attack (TIA), and cerebral infarction without residual deficits: Secondary | ICD-10-CM | POA: Insufficient documentation

## 2020-08-26 DIAGNOSIS — Z87891 Personal history of nicotine dependence: Secondary | ICD-10-CM | POA: Insufficient documentation

## 2020-08-26 DIAGNOSIS — I110 Hypertensive heart disease with heart failure: Secondary | ICD-10-CM | POA: Diagnosis not present

## 2020-08-26 DIAGNOSIS — J8410 Pulmonary fibrosis, unspecified: Secondary | ICD-10-CM | POA: Insufficient documentation

## 2020-08-26 DIAGNOSIS — Z7984 Long term (current) use of oral hypoglycemic drugs: Secondary | ICD-10-CM | POA: Insufficient documentation

## 2020-08-26 DIAGNOSIS — J849 Interstitial pulmonary disease, unspecified: Secondary | ICD-10-CM

## 2020-08-26 DIAGNOSIS — E1151 Type 2 diabetes mellitus with diabetic peripheral angiopathy without gangrene: Secondary | ICD-10-CM | POA: Diagnosis not present

## 2020-08-26 DIAGNOSIS — Z8249 Family history of ischemic heart disease and other diseases of the circulatory system: Secondary | ICD-10-CM | POA: Diagnosis not present

## 2020-08-26 LAB — BASIC METABOLIC PANEL
Anion gap: 15 (ref 5–15)
BUN: 16 mg/dL (ref 8–23)
CO2: 22 mmol/L (ref 22–32)
Calcium: 9.3 mg/dL (ref 8.9–10.3)
Chloride: 95 mmol/L — ABNORMAL LOW (ref 98–111)
Creatinine, Ser: 1.11 mg/dL (ref 0.61–1.24)
GFR, Estimated: 57 mL/min — ABNORMAL LOW (ref >60)
Glucose, Bld: 96 mg/dL (ref 70–99)
Potassium: 3.5 mmol/L (ref 3.5–5.1)
Sodium: 132 mmol/L — ABNORMAL LOW (ref 135–145)

## 2020-08-26 LAB — BRAIN NATRIURETIC PEPTIDE: B Natriuretic Peptide: 1535.9 pg/mL — ABNORMAL HIGH (ref 0.0–100.0)

## 2020-08-26 MED ORDER — FUROSEMIDE 10 MG/ML IJ SOLN: 80.0000 mg | Freq: Once | INTRAMUSCULAR | Status: AC

## 2020-08-26 MED ORDER — POTASSIUM CHLORIDE CRYS ER 20 MEQ PO TBCR: 40.0000 meq | EXTENDED_RELEASE_TABLET | Freq: Once | ORAL | Status: AC

## 2020-08-26 MED ORDER — FUROSEMIDE 10 MG/ML IJ SOLN
INTRAMUSCULAR | Status: AC
  Administered 2020-08-26: 80 mg via INTRAVENOUS
  Filled 2020-08-26: qty 8

## 2020-08-26 MED ORDER — POTASSIUM CHLORIDE CRYS ER 20 MEQ PO TBCR
EXTENDED_RELEASE_TABLET | ORAL | Status: AC
  Administered 2020-08-26: 40 meq via ORAL
  Filled 2020-08-26: qty 2

## 2020-08-26 NOTE — Patient Instructions (Signed)
Continue weighing daily and call for an overnight weight gain of > 2 pounds or a weekly weight gain of >5 pounds. 

## 2020-08-26 NOTE — Discharge Instructions (Signed)
Furosemide Injection What is this medicine? FUROSEMIDE (fyoor [place] se mide) is a diuretic. It helps you make more urine and to lose salt and excess water. This medicine is used to treat high blood pressure, and edema or swelling from heart, kidney, or liver disease. This medicine may be used for other purposes; ask your health care provider or pharmacist if you have questions. What should I tell my health care provider before I take this medicine? They need to know if you have any of these conditions:  abnormal blood electrolytes  diarrhea or vomiting  gout  heart disease  kidney disease, small amounts of urine, or difficulty passing urine  liver disease  premature newborn  thyroid disease  an unusual or allergic reaction to furosemide, sulfa drugs, other medicines, foods, dyes, or preservatives  pregnant or trying to get pregnant  breast-feeding How should I use this medicine? This medicine is for injection into a muscle or a vein. It is given by a healthcare professional in a hospital or clinic setting. Talk to your pediatrician regarding the use of this medicine in children. While this drug may be prescribed for selected conditions, precautions do apply. Overdosage: If you think you have taken too much of this medicine contact a poison control center or emergency room at once. NOTE: This medicine is only for you. Do not share this medicine with others. What if I miss a dose? This does not apply. What may interact with this medicine?  aspirin and aspirin-like medicines  certain antibiotics  chloral hydrate  cisplatin  cyclosporine  digoxin  diuretics  laxatives  lithium  medicines for blood pressure  medicines that relax muscles for surgery  methotrexate  NSAIDS, medicines for pain and inflammation like ibuprofen, naproxen, or indomethacin  phenytoin  steroid medicines like prednisone or cortisone  sucralfate  thyroid hormones This list may not  describe all possible interactions. Give your health care provider a list of all the medicines, herbs, non-prescription drugs, or dietary supplements you use. Also tell them if you smoke, drink alcohol, or use illegal drugs. Some items may interact with your medicine. What should I watch for while using this medicine? You will be monitored closely while you are on this medicine. This medicine can increase the amount of sugar in blood or urine. If you are a diabetic your sugar will need to be checked. This medicine may cause serious skin reactions. They can happen weeks to months after starting the medicine. Contact your health care provider right away if you notice fevers or flu-like symptoms with a rash. The rash may be red or purple and then turn into blisters or peeling of the skin. Or, you might notice a red rash with swelling of the face, lips or lymph nodes in your neck or under your arms. You may get drowsy or dizzy. Do not drive, use machinery, or do anything that needs mental alertness until you know how this drug affects you. Do not stand or sit up quickly, especially if you are an older patient. This reduces the risk of dizzy or fainting spells. Alcohol can make you more drowsy and dizzy. Avoid alcoholic drinks. This medicine can make you more sensitive to the sun. Keep out of the sun. If you cannot avoid being in the sun, wear protective clothing and use sunscreen. Do not use sun lamps or tanning beds/booths. What side effects may I notice from receiving this medicine? Side effects that you should report to your doctor or health care  professional as soon as possible:  blood in urine or stools  dry mouth  fever or chills  hearing loss or ringing in the ears  irregular heartbeat  muscle pain or weakness, cramps  rash, fever, and swollen lymph nodes  redness, blistering, peeling or loosening of the skin, including inside the mouth  stomach upset, pain, or nausea  tingling or  numbness in the hands or feet  unusually weak or tired  vomiting or diarrhea  yellowing of the eyes or skin Side effects that usually do not require medical attention (report to your doctor or health care professional if they continue or are bothersome):  headache  loss of appetite  unusual bleeding or bruising This list may not describe all possible side effects. Call your doctor for medical advice about side effects. You may report side effects to FDA at 1-800-FDA-1088. Where should I keep my medicine? This drug is given in a hospital or clinic and will not be stored at home. NOTE: This sheet is a summary. It may not cover all possible information. If you have questions about this medicine, talk to your doctor, pharmacist, or health care provider.  2020 Elsevier/Gold Standard (2019-01-27 13:57:49)

## 2020-08-26 NOTE — Telephone Encounter (Signed)
Spoke with wife, [name], regarding patient's BMP/BNP results obtained earlier today prior to giving 80mg  IV lasix/ 40meq PO potassium.   Renal function looks good, sodium slightly low at 132 and potassium is 3.5. BNP elevated at 1535.9   Advised [name] to not give him his oral diuretics today since he received the IV lasix after these labs were drawn. Could consider repeating IV lasix tomorrow when we see him again. She verbalized understanding.

## 2020-08-26 NOTE — Progress Notes (Signed)
Patient ID: Dylan Knight, male    DOB: 1924-04-27, 86 y.o.   MRN: [id number]  HPI  Dylan Knight is a 86 y/o male with a history of DM, hyperlipidemia, HTN, stroke, thyroid disease, PAD, COPD, pulmonary fibrosis, previous tobacco use and chronic heart failure.   Echo report from 07/20/20 reviewed and showed an EF of 60-65% along with mild LVH and mild/moderate TR.   Admitted 07/19/20 due to HF exacerbation. Recently treated for pneumonia. Pulmonology consult obtained. Chest CT done which showed pulmonary fibrosis. Given lasix and albuterol inhaler. Unable to be weaned off oxygen. Discharged after 5 days.   He presents today for an urgent visit with a chief complaint of worsening pedal edema. His wife says that it's been worsening for the last week. He has associated fatigue, shortness of breath, difficulty sleeping, light-headedness, weight gain and intermittent confusion along with this. He denies any abdominal distention, palpitations, chest pain or cough. No change in diet or fluid intake.   Continues to have a rash on his abdomen/ groin.   Takes an additional furosemide if weight goes above 147 pounds at home and his wife says that he's been taking 40mg  daily for the last week without any improvement. She doesn't feel like the furosemide works as well as it did. He hasn't taken his furosemide yet today because of coming to this appointment.   Past Medical History:  Diagnosis Date  . Arthritis    left hip  . BPH (benign prostatic hyperplasia)   . Cancer [facility])    Skin cancers removed  . Carotid artery occlusion   . CHF (congestive heart failure) ([place])   . COPD (chronic obstructive pulmonary disease) ([place])   . Diabetes mellitus without complication ([place])   . Hyperlipidemia   . Hypertension   . Hypothyroidism   . Macular degeneration   . Peripheral arterial disease ([place]) 2002  . Pulmonary fibrosis ([place])   . Stroke [facility]) 2005   Past Surgical History:  Procedure Laterality Date  .  ABDOMINAL AORTIC ANEURYSM REPAIR  2003   Aorto-left femoral-right iliac BPG by Dr. Scot Dock  . Aortic aneursym surgery     2003- Dr. Scot Dock  . BACK SURGERY     1960  . CAROTID ENDARTERECTOMY  Jan. 25,2007   Right CEA  . Carotid stenosis surgery on right     2007  . CARPAL TUNNEL RELEASE Left 06/02/2016   Procedure: LEFT CARPAL TUNNEL RELEASE;  Surgeon: Daryll Brod, MD;  Location[place];  Service: Orthopedics;  Laterality: Left;  . EYE SURGERY     Catarct surgery and lens implant - bilateral  . PR VEIN BYPASS GRAFT,AORTO-FEM-POP  2005   Revision Right Fem-pop  . PR VEIN BYPASS GRAFT,AORTO-FEM-POP  2002   Right Fem-pop  . RADIOLOGY WITH ANESTHESIA N/A 12/02/2016   Procedure: MRI LUMBAR SPINE WITHOUT;  Surgeon: Medication Radiologist, MD;  Location: [place];  Service: Radiology;  Laterality: N/A;  . Right leg bypass     2002 -  and amputation of right fifth toe  . SPINE SURGERY  1960   lumbar spine  . [place], 2007   cervical spine  . TOE AMPUTATION  08-2001   right 5th toe amputation   Family History  Problem Relation Age of Onset  . Stroke Mother   . Hypertension Mother   . Heart disease Father   . Heart attack Father   . Cancer Brother   . Hypertension Brother  Social History   Tobacco Use  . Smoking status: Former Smoker    Packs/day: 1.00    Years: 63.00    Pack years: 63.00    Types: Cigarettes    Quit date: 04/09/2004    Years since quitting: 16.3  . Smokeless tobacco: Former Systems developer  . Tobacco comment: Quit smoking in 2005  Substance Use Topics  . Alcohol use: No    Alcohol/week: 0.0 standard drinks   Allergies  Allergen Reactions  . Iohexol Hives and Other (See Comments)    Went away with benadryl   . Iodine Rash   Prior to Admission medications   Medication Sig Start Date End Date Taking? Authorizing Provider  acetaminophen (TYLENOL) 325 MG tablet Take 650 mg by mouth every 6 (six) hours as needed for mild pain.    Yes  [provider]  albuterol (VENTOLIN HFA) 108 (90 Base) MCG/ACT inhaler Inhale 2 puffs into the lungs every 6 (six) hours as needed for wheezing or shortness of breath. 07/24/20  Yes Dwyane Dee, MD  atenolol (TENORMIN) 50 MG tablet Take 1 tablet (50 mg total) by mouth daily. 07/25/20  Yes Dwyane Dee, MD  clopidogrel (PLAVIX) 75 MG tablet TAKE 1 TABLET (75MG ) BY MOUTH EVERY DAY 12/08/19  Yes Tonia Ghent, MD  clotrimazole-betamethasone (LOTRISONE) cream APPLY TO AFFECTED AREA TWICE A DAY 06/04/20  Yes Tonia Ghent, MD  ezetimibe-simvastatin (VYTORIN) 10-40 MG tablet TAKE 1 TABLET BY MOUTH EVERY DAY 05/19/20  Yes Tonia Ghent, MD  furosemide (LASIX) 20 MG tablet Take 1 tablet (20 mg total) by mouth daily. Take an extra tab if weight is 147 or above in the AM. 08/01/20  Yes Tonia Ghent, MD  guaiFENesin (MUCINEX) 600 MG 12 hr tablet Take 600 mg by mouth 2 (two) times daily.    Yes [provider]  levothyroxine (SYNTHROID) 25 MCG tablet Take 2 tablets (50 mcg total) by mouth daily before breakfast. 03/29/20  Yes Tonia Ghent, MD  Multiple Vitamins-Minerals (CENTRUM SILVER PO) Take 1 tablet by mouth daily.   Yes [provider]  sitaGLIPtin-metformin (JANUMET) 50-500 MG tablet Take 1 tablet by mouth daily.   Yes [provider]     Review of Systems  Constitutional: Positive for fatigue. Negative for appetite change.  HENT: Positive for hearing loss and rhinorrhea. Negative for congestion and sore throat.   Eyes: Positive for visual disturbance (left eye macular degeneration).  Respiratory: Positive for shortness of breath (with miminal exertion). Negative for cough and chest tightness.   Cardiovascular: Positive for leg swelling (worsening). Negative for chest pain and palpitations.  Gastrointestinal: Negative for abdominal distention and abdominal pain.  Endocrine: Negative.   Genitourinary: Negative.   Musculoskeletal: Positive for  arthralgias (leg pain) and back pain. Negative for neck pain.  Skin: Negative.   Allergic/Immunologic: Negative.   Neurological: Positive for light-headedness. Negative for dizziness.  Hematological: Negative for adenopathy. Does not bruise/bleed easily.  Psychiatric/Behavioral: Positive for confusion (intermittently) and sleep disturbance (wearing oxygen). Negative for dysphoric mood. The patient is not nervous/anxious.    Vitals:   08/26/20 1039  BP: 115/64  Pulse: 78  Resp: 16  SpO2: 94%  Weight: 151 lb 9 oz (68.7 kg)  Height: 5\' 6"  (1.676 m)   Wt Readings from Last 3 Encounters:  08/26/20 151 lb 9 oz (68.7 kg)  08/06/20 147 lb (66.7 kg)  08/01/20 150 lb 5 oz (68.2 kg)   Lab Results  Component Value Date  CREATININE 1.25 08/01/2020   CREATININE 1.18 07/24/2020   CREATININE 1.20 07/23/2020    Physical Exam Vitals and nursing note reviewed.  Constitutional:      Appearance: He is well-developed.  HENT:     Right Ear: Decreased hearing noted.     Left Ear: Decreased hearing noted.  Cardiovascular:     Rate and Rhythm: Normal rate and regular rhythm.  Pulmonary:     Effort: Pulmonary effort is normal. No respiratory distress.     Breath sounds: Rales (lower lobes) present. No rhonchi.  Abdominal:     Palpations: Abdomen is soft.     Tenderness: There is no abdominal tenderness.  Musculoskeletal:     Cervical back: Normal range of motion and neck supple.     Right lower leg: No tenderness. Edema (3+ pitting) present.     Left lower leg: No tenderness. Edema (3+ pitting) present.  Skin:    General: Skin is warm and dry.  Neurological:     General: No focal deficit present.     Mental Status: He is alert and oriented to person, place, and time.  Psychiatric:        Mood and Affect: Mood normal.        Behavior: Behavior normal.    Assessment & Plan:  1: Acute on Chronic heart failure with preserved ejection fraction with structural changes- - NYHA class III -  fluid overloaded today with worsening pedal edema, rales in lower lobes and 4 pound weight gain - weighing daily and he was reminded to call for an overnight weight gain of > 2 pounds or a weekly weight gain of > 5 pounds - weight up 4 pounds from last visit here 3 weeks ago - will send for 80mg  IV lasix/ 20meq PO potassium today - BMP/ BNP will be drawn; will call wife after results are obtained to let her know whether to give him his normal furosemide dose today - not adding salt and wife tries to read food labels - BP will not tolerate adding entresto - has PT coming out twice weekly along with nursing once/ week - pro-BNP on 08/01/20 was 1229.0 - reports receiving both pfizer covid vaccines along with flu vaccine  2: HTN- - BP looks good today - saw PCP Damita Dunnings) 08/01/20 - BMP 08/01/20 reviewed showed sodium 134, potassium 4.0, creatinine 1.25 and GFR 53.71  3: Pulmonary fibrosis- - saw pulmonology ([place]) 05/04/2019 and has appt scheduled on 09/12/20 with Dr. Patsey Berthold - currently wearing oxygen at 4L around the clock but wants to come off of it since he wasn't wearing it prior to recent admission - checks his oxygen level at home; advised that it needed to stay above 90% at rest and exertion  4: Lymphedema- - stage 2 - limited in his ability to exercise due to shortness of breath and oxygen tubing - doesn't elevate his legs like he should; encouraged him to elevate his legs when sitting for long periods of time - tried the compression socks but had great difficulty in getting them on; wife has ordered a pair that zips up so will try those once he receives them - consider compression boots if edema persists   Medication bottles were reviewed.   Return tomorrow for a recheck of symptoms.

## 2020-08-27 ENCOUNTER — Ambulatory Visit: Payer: Medicare PPO | Attending: Family | Admitting: Family

## 2020-08-27 ENCOUNTER — Encounter: Payer: Self-pay | Admitting: Family

## 2020-08-27 ENCOUNTER — Telehealth: Payer: Self-pay

## 2020-08-27 VITALS — BP 113/65 | HR 76 | Resp 16 | Ht 66.0 in | Wt 149.0 lb

## 2020-08-27 DIAGNOSIS — E039 Hypothyroidism, unspecified: Secondary | ICD-10-CM | POA: Insufficient documentation

## 2020-08-27 DIAGNOSIS — Z87891 Personal history of nicotine dependence: Secondary | ICD-10-CM | POA: Insufficient documentation

## 2020-08-27 DIAGNOSIS — I890 Lymphedema, not elsewhere classified: Secondary | ICD-10-CM | POA: Diagnosis not present

## 2020-08-27 DIAGNOSIS — I5032 Chronic diastolic (congestive) heart failure: Secondary | ICD-10-CM

## 2020-08-27 DIAGNOSIS — Z85828 Personal history of other malignant neoplasm of skin: Secondary | ICD-10-CM | POA: Insufficient documentation

## 2020-08-27 DIAGNOSIS — Z79890 Hormone replacement therapy (postmenopausal): Secondary | ICD-10-CM | POA: Insufficient documentation

## 2020-08-27 DIAGNOSIS — J449 Chronic obstructive pulmonary disease, unspecified: Secondary | ICD-10-CM | POA: Diagnosis not present

## 2020-08-27 DIAGNOSIS — Z7984 Long term (current) use of oral hypoglycemic drugs: Secondary | ICD-10-CM | POA: Diagnosis not present

## 2020-08-27 DIAGNOSIS — J8410 Pulmonary fibrosis, unspecified: Secondary | ICD-10-CM | POA: Diagnosis not present

## 2020-08-27 DIAGNOSIS — E785 Hyperlipidemia, unspecified: Secondary | ICD-10-CM | POA: Insufficient documentation

## 2020-08-27 DIAGNOSIS — I10 Essential (primary) hypertension: Secondary | ICD-10-CM

## 2020-08-27 DIAGNOSIS — N400 Enlarged prostate without lower urinary tract symptoms: Secondary | ICD-10-CM | POA: Diagnosis not present

## 2020-08-27 DIAGNOSIS — Z79899 Other long term (current) drug therapy: Secondary | ICD-10-CM | POA: Diagnosis not present

## 2020-08-27 DIAGNOSIS — I110 Hypertensive heart disease with heart failure: Secondary | ICD-10-CM | POA: Insufficient documentation

## 2020-08-27 DIAGNOSIS — E1151 Type 2 diabetes mellitus with diabetic peripheral angiopathy without gangrene: Secondary | ICD-10-CM | POA: Insufficient documentation

## 2020-08-27 DIAGNOSIS — J849 Interstitial pulmonary disease, unspecified: Secondary | ICD-10-CM

## 2020-08-27 DIAGNOSIS — Z8673 Personal history of transient ischemic attack (TIA), and cerebral infarction without residual deficits: Secondary | ICD-10-CM | POA: Diagnosis not present

## 2020-08-27 DIAGNOSIS — M1612 Unilateral primary osteoarthritis, left hip: Secondary | ICD-10-CM | POA: Diagnosis not present

## 2020-08-27 LAB — BASIC METABOLIC PANEL
Anion gap: 11 (ref 5–15)
BUN: 21 mg/dL (ref 8–23)
CO2: 27 mmol/L (ref 22–32)
Calcium: 9.5 mg/dL (ref 8.9–10.3)
Chloride: 95 mmol/L — ABNORMAL LOW (ref 98–111)
Creatinine, Ser: 1.4 mg/dL — ABNORMAL HIGH (ref 0.61–1.24)
GFR, Estimated: 43 mL/min — ABNORMAL LOW (ref >60)
Glucose, Bld: 88 mg/dL (ref 70–99)
Potassium: 3.8 mmol/L (ref 3.5–5.1)
Sodium: 133 mmol/L — ABNORMAL LOW (ref 135–145)

## 2020-08-27 LAB — BRAIN NATRIURETIC PEPTIDE: B Natriuretic Peptide: 1724.4 pg/mL — ABNORMAL HIGH (ref 0.0–100.0)

## 2020-08-27 NOTE — Patient Instructions (Addendum)
DASH Eating Plan DASH stands for "Dietary Approaches to Stop Hypertension." The DASH eating plan is a healthy eating plan that has been shown to reduce high blood pressure (hypertension). It may also reduce your risk for type 2 diabetes, heart disease, and stroke. The DASH eating plan may also help with weight loss. What are tips for following this plan?  General guidelines  Avoid eating more than 2,300 mg (milligrams) of salt (sodium) a day. If you have hypertension, you may need to reduce your sodium intake to 1,500 mg a day.  Limit alcohol intake to no more than 1 drink a day for nonpregnant women and 2 drinks a day for men. One drink equals 12 oz of beer, 5 oz of wine, or 1 oz of hard liquor.  Work with your health care provider to maintain a healthy body weight or to lose weight. Ask what an ideal weight is for you.  Get at least 30 minutes of exercise that causes your heart to beat faster (aerobic exercise) most days of the week. Activities may include walking, swimming, or biking.  Work with your health care provider or diet and nutrition specialist (dietitian) to adjust your eating plan to your individual calorie needs. Reading food labels   Check food labels for the amount of sodium per serving. Choose foods with less than 5 percent of the Daily Value of sodium. Generally, foods with less than 300 mg of sodium per serving fit into this eating plan.  To find whole grains, look for the word "whole" as the first word in the ingredient list. Shopping  Buy products labeled as "low-sodium" or "no salt added."  Buy fresh foods. Avoid canned foods and premade or frozen meals. Cooking  Avoid adding salt when cooking. Use salt-free seasonings or herbs instead of table salt or sea salt. Check with your health care provider or pharmacist before using salt substitutes.  Do not fry foods. Cook foods using healthy methods such as baking, boiling, grilling, and broiling instead.  Cook with  heart-healthy oils, such as olive, canola, soybean, or sunflower oil. Meal planning  Eat a balanced diet that includes: ? 5 or more servings of fruits and vegetables each day. At each meal, try to fill half of your plate with fruits and vegetables. ? Up to 6-8 servings of whole grains each day. ? Less than 6 oz of lean meat, poultry, or fish each day. A 3-oz serving of meat is about the same size as a deck of cards. One egg equals 1 oz. ? 2 servings of low-fat dairy each day. ? A serving of nuts, seeds, or beans 5 times each week. ? Heart-healthy fats. Healthy fats called Omega-3 fatty acids are found in foods such as flaxseeds and coldwater fish, like sardines, salmon, and mackerel.  Limit how much you eat of the following: ? Canned or prepackaged foods. ? Food that is high in trans fat, such as fried foods. ? Food that is high in saturated fat, such as fatty meat. ? Sweets, desserts, sugary drinks, and other foods with added sugar. ? Full-fat dairy products.  Do not salt foods before eating.  Try to eat at least 2 vegetarian meals each week.  Eat more home-cooked food and less restaurant, buffet, and fast food.  When eating at a restaurant, ask that your food be prepared with less salt or no salt, if possible. What foods are recommended? The items listed may not be a complete list. Talk with your dietitian about   what dietary choices are best for you. Grains Whole-grain or whole-wheat bread. Whole-grain or whole-wheat pasta. Brown rice. Oatmeal. Quinoa. Bulgur. Whole-grain and low-sodium cereals. Pita bread. Low-fat, low-sodium crackers. Whole-wheat flour tortillas. Vegetables Fresh or frozen vegetables (raw, steamed, roasted, or grilled). Low-sodium or reduced-sodium tomato and vegetable juice. Low-sodium or reduced-sodium tomato sauce and tomato paste. Low-sodium or reduced-sodium canned vegetables. Fruits All fresh, dried, or frozen fruit. Canned fruit in natural juice (without  added sugar). Meat and other protein foods Skinless chicken or turkey. Ground chicken or turkey. Pork with fat trimmed off. Fish and seafood. Egg whites. Dried beans, peas, or lentils. Unsalted nuts, nut butters, and seeds. Unsalted canned beans. Lean cuts of beef with fat trimmed off. Low-sodium, lean deli meat. Dairy Low-fat (1%) or fat-free (skim) milk. Fat-free, low-fat, or reduced-fat cheeses. Nonfat, low-sodium ricotta or cottage cheese. Low-fat or nonfat yogurt. Low-fat, low-sodium cheese. Fats and oils Soft margarine without trans fats. Vegetable oil. Low-fat, reduced-fat, or light mayonnaise and salad dressings (reduced-sodium). Canola, safflower, olive, soybean, and sunflower oils. Avocado. Seasoning and other foods Herbs. Spices. Seasoning mixes without salt. Unsalted popcorn and pretzels. Fat-free sweets. What foods are not recommended? The items listed may not be a complete list. Talk with your dietitian about what dietary choices are best for you. Grains Baked goods made with fat, such as croissants, muffins, or some breads. Dry pasta or rice meal packs. Vegetables Creamed or fried vegetables. Vegetables in a cheese sauce. Regular canned vegetables (not low-sodium or reduced-sodium). Regular canned tomato sauce and paste (not low-sodium or reduced-sodium). Regular tomato and vegetable juice (not low-sodium or reduced-sodium). Pickles. Olives. Fruits Canned fruit in a light or heavy syrup. Fried fruit. Fruit in cream or butter sauce. Meat and other protein foods Fatty cuts of meat. Ribs. Fried meat. Bacon. Sausage. Bologna and other processed lunch meats. Salami. Fatback. Hotdogs. Bratwurst. Salted nuts and seeds. Canned beans with added salt. Canned or smoked fish. Whole eggs or egg yolks. Chicken or turkey with skin. Dairy Whole or 2% milk, cream, and half-and-half. Whole or full-fat cream cheese. Whole-fat or sweetened yogurt. Full-fat cheese. Nondairy creamers. Whipped toppings.  Processed cheese and cheese spreads. Fats and oils Butter. Stick margarine. Lard. Shortening. Ghee. Bacon fat. Tropical oils, such as coconut, palm kernel, or palm oil. Seasoning and other foods Salted popcorn and pretzels. Onion salt, garlic salt, seasoned salt, table salt, and sea salt. Worcestershire sauce. Tartar sauce. Barbecue sauce. Teriyaki sauce. Soy sauce, including reduced-sodium. Steak sauce. Canned and packaged gravies. Fish sauce. Oyster sauce. Cocktail sauce. Horseradish that you find on the shelf. Ketchup. Mustard. Meat flavorings and tenderizers. Bouillon cubes. Hot sauce and Tabasco sauce. Premade or packaged marinades. Premade or packaged taco seasonings. Relishes. Regular salad dressings. Where to find more information:  National Heart, Lung, and Blood Institute: www.nhlbi.nih.gov  American Heart Association: www.heart.org Summary  The DASH eating plan is a healthy eating plan that has been shown to reduce high blood pressure (hypertension). It may also reduce your risk for type 2 diabetes, heart disease, and stroke.  With the DASH eating plan, you should limit salt (sodium) intake to 2,300 mg a day. If you have hypertension, you may need to reduce your sodium intake to 1,500 mg a day.  When on the DASH eating plan, aim to eat more fresh fruits and vegetables, whole grains, lean proteins, low-fat dairy, and heart-healthy fats.  Work with your health care provider or diet and nutrition specialist (dietitian) to adjust your eating plan to your   individual calorie needs. This information is not intended to replace advice given to you by your health care provider. Make sure you discuss any questions you have with your health care provider. Document Revised: 10/08/2017 Document Reviewed: 10/19/2016 Elsevier Patient Education  [place]. Lymphedema  Lymphedema is swelling that is caused by the abnormal collection of lymph in the tissues under the skin. Lymph is fluid  from the tissues in your body that is removed through the lymphatic system. This system is part of your body's defense system (immune system) and includes lymph nodes and lymph vessels. The lymph vessels collect and carry the excess fluid, fats, proteins, and wastes from the tissues of the body to the bloodstream. This system also works to clean and remove bacteria and waste products from the body. Lymphedema occurs when the lymphatic system is blocked. When the lymph vessels or lymph nodes are blocked or damaged, lymph does not drain properly. This causes an abnormal buildup of lymph, which leads to swelling in the affected area. This may include the trunk area, or an arm or leg. Lymphedema cannot be cured by medicines, but various methods can be used to help reduce the swelling. There are two types of lymphedema: primary lymphedema and secondary lymphedema. What are the causes? The cause of this condition depends on the type of lymphedema that you have.  Primary lymphedema is caused by the absence of lymph vessels or having abnormal lymph vessels at birth.  Secondary lymphedema occurs when lymph vessels are blocked or damaged. Secondary lymphedema is more common. Common causes of lymph vessel blockage include: ? Skin infection, such as cellulitis. ? Infection by parasites (filariasis). ? Injury. ? Radiation therapy. ? Cancer. ? Formation of scar tissue. ? Surgery. What are the signs or symptoms? Symptoms of this condition include:  Swelling of the arm or leg.  A heavy or tight feeling in the arm or leg.  Swelling of the feet, toes, or fingers. Shoes or rings may fit more tightly than before.  Redness of the skin over the affected area.  Limited movement of the affected limb.  Sensitivity to touch or discomfort in the affected limb. How is this diagnosed? This condition may be diagnosed based on:  Your symptoms and medical history.  A physical exam.  Bioimpedance spectroscopy.  In this test, painless electrical currents are used to measure fluid levels in your body.  Imaging tests, such as: ? Lymphoscintigraphy. In this test, a low dose of a radioactive substance is injected to trace the flow of lymph through the lymph vessels. ? MRI. ? CT scan. ? Duplex ultrasound. This test uses sound waves to produce images of the vessels and the blood flow on a screen. ? Lymphangiography. In this test, a contrast dye is injected into the lymph vessel to help show blockages. How is this treated? Treatment for this condition may depend on the cause of your lymphedema. Treatment may include:  Complete decongestive therapy (CDT). This is done by a certified lymphedema therapist to reduce fluid congestion. This therapy includes: ? Manual lymph drainage. This is a special massage technique that promotes lymph drainage out of a limb. ? Skin care. ? Compression wrapping of the affected area. ? Specific exercises. Certain exercises can help fluid move out of the affected limb.  Compression. Various methods may be used to apply pressure to the affected limb to reduce the swelling. They include: ? Wearing compression stockings or sleeves on the affected limb. ? Wrapping the affected limb  with special bandages.  Surgery. This is usually done for severe cases only. For example, surgery may be done if you have trouble moving the limb or if the swelling does not get better with other treatments. If an underlying condition is causing the lymphedema, treatment for that condition will be done. For example, antibiotic medicines may be used to treat an infection. Follow these instructions at home: Self-care  The affected area is more likely to become injured or infected. Take these steps to help prevent infection: ? Keep the affected area clean and dry. ? Use approved creams or lotions to keep the skin moisturized. ? Protect your skin from cuts:  Use gloves while cooking or gardening.  Do  not walk barefoot.  If you shave the affected area, use an Copy.  Do not wear tight clothes, shoes, or jewelry.  Eat a healthy diet that includes a lot of fruits and vegetables. Activity  Exercise regularly as directed by your health care provider.  Do not sit with your legs crossed.  When possible, keep the affected limb raised (elevated) above the level of your heart.  Avoid carrying things with an arm that is affected by lymphedema. General instructions  Wear compression stockings or sleeves as told by your health care provider.  Note any changes in size of the affected limb. You may be instructed to take regular measurements and keep track of them.  Take over-the-counter and prescription medicines only as told by your health care provider.  If you were prescribed an antibiotic medicine, take or apply it as told by your health care provider. Do not stop using the antibiotic even if you start to feel better.  Do not use heating pads or ice packs over the affected area.  Avoid having blood draws, IV insertions, or blood pressure checked on the affected limb.  Keep all follow-up visits as told by your health care provider. This is important. Contact a health care provider if you:  Continue to have swelling in your limb.  Have a cut that does not heal.  Have redness or pain in the affected area. Get help right away if you:  Have new swelling in your limb that comes on suddenly.  Develop purplish spots, rash or sores (lesions) on your affected limb.  Have shortness of breath.  Have a fever or chills. Summary  Lymphedema is swelling that is caused by the abnormal collection of lymph in the tissues under the skin.  Lymph is fluid from the tissues in your body that is removed through the lymphatic system. This system collects and carries excess fluid, fats, proteins, and wastes from the tissues of the body to the bloodstream.  Lymphedema causes swelling,  pain, and redness in the affected area. This may include the trunk area, or an arm or leg.  Treatment for this condition may depend on the cause of your lymphedema. Treatment may include complete decongestive therapy (CDT), compression methods, surgery, or treating the underlying cause. This information is not intended to replace advice given to you by your health care provider. Make sure you discuss any questions you have with your health care provider. Document Revised: 11/08/2017 Document Reviewed: 11/08/2017 Elsevier Patient Education  2020 [employer].   Follow up with wound care center and be looking for a call from the [place]

## 2020-08-27 NOTE — Telephone Encounter (Cosign Needed)
Patient and Wife were called and discussed results of BNP and BMP today. Advised that we stay course in regards to following up with the wound care center for Lymphedema and in obtaining compression boots. Patient's wife reports understanding and patient is hard of hearing so she was explaining things to him. Cardiology also consulted regarding patient condition and plan to defer more IV lasix explained to wife as well. Patient unable to get into the Wound care center until 12/22. Wife advised if the weeping subsides with elevation of legs, that she can call us and we can see about switching to an appointment at the Lymphedema clinic if possible. Reiterated importance of diet and fluid restrictions, patients wife reports understanding.

## 2020-08-27 NOTE — Telephone Encounter (Signed)
I have been out with an illness and I will work on this as quickly as I can.  Will send when complete.  Thanks.

## 2020-08-27 NOTE — Progress Notes (Signed)
Patient ID: Dylan Knight, male    DOB: Jan 11, 1924, 86 y.o.   MRN: [id number]  Congestive Heart Failure Associated symptoms include fatigue and shortness of breath. Pertinent negatives include no abdominal pain, chest pain or palpitations.  Shortness of Breath Associated symptoms include leg swelling (worsening) and rhinorrhea. Pertinent negatives include no abdominal pain, chest pain, neck pain or sore throat.    Dylan Knight is a 86 y/o male with a history of DM, hyperlipidemia, HTN, stroke, thyroid disease, PAD, COPD, pulmonary fibrosis, previous tobacco use and chronic heart failure.   Echo report from 07/20/20 reviewed and showed an EF of 60-65% along with mild LVH and mild/moderate TR.   Admitted 07/19/20 due to HF exacerbation. Recently treated for pneumonia. Pulmonology consult obtained. Chest CT done which showed pulmonary fibrosis. Given lasix and albuterol inhaler. Unable to be weaned off oxygen. Discharged after 5 days.   Dylan Knight presents today for a follow up after an urgent visit yesterday where Dylan Knight received IV lasix 80mg  and Potassium 33meq PO. Patient still having bilateral leg swelling and has noted weeping from the right leg when checked for pitting edema.     Past Medical History:  Diagnosis Date  . Arthritis    left hip  . BPH (benign prostatic hyperplasia)   . Cancer [facility])    Skin cancers removed  . Carotid artery occlusion   . CHF (congestive heart failure) ([place])   . COPD (chronic obstructive pulmonary disease) ([place])   . Diabetes mellitus without complication ([place])   . Hyperlipidemia   . Hypertension   . Hypothyroidism   . Macular degeneration   . Peripheral arterial disease ([place]) 2002  . Pulmonary fibrosis ([place])   . Stroke [facility]) 2005   Past Surgical History:  Procedure Laterality Date  . ABDOMINAL AORTIC ANEURYSM REPAIR  2003   Aorto-left femoral-right iliac BPG by Dr. Scot Dock  . Aortic aneursym surgery     2003- Dr. Scot Dock  . BACK SURGERY     1960  .  CAROTID ENDARTERECTOMY  Jan. 25,2007   Right CEA  . Carotid stenosis surgery on right     2007  . CARPAL TUNNEL RELEASE Left 06/02/2016   Procedure: LEFT CARPAL TUNNEL RELEASE;  Surgeon: Daryll Brod, MD;  Location: [place];  Service: Orthopedics;  Laterality: Left;  . EYE SURGERY     Catarct surgery and lens implant - bilateral  . PR VEIN BYPASS GRAFT,AORTO-FEM-POP  2005   Revision Right Fem-pop  . PR VEIN BYPASS GRAFT,AORTO-FEM-POP  2002   Right Fem-pop  . RADIOLOGY WITH ANESTHESIA N/A 12/02/2016   Procedure: MRI LUMBAR SPINE WITHOUT;  Surgeon: Medication Radiologist, MD;  Location: [place];  Service: Radiology;  Laterality: N/A;  . Right leg bypass     2002 -  and amputation of right fifth toe  . SPINE SURGERY  1960   lumbar spine  . [place], 2007   cervical spine  . TOE AMPUTATION  08-2001   right 5th toe amputation   Family History  Problem Relation Age of Onset  . Stroke Mother   . Hypertension Mother   . Heart disease Father   . Heart attack Father   . Cancer Brother   . Hypertension Brother    Social History   Tobacco Use  . Smoking status: Former Smoker    Packs/day: 1.00    Years: 63.00    Pack years: 63.00    Types: Cigarettes    Quit  date: 04/09/2004    Years since quitting: 16.3  . Smokeless tobacco: Former Systems developer  . Tobacco comment: Quit smoking in 2005  Substance Use Topics  . Alcohol use: No    Alcohol/week: 0.0 standard drinks   Allergies  Allergen Reactions  . Iohexol Hives and Other (See Comments)    Went away with benadryl   . Iodine Rash   Prior to Admission medications   Medication Sig Start Date End Date Taking? Authorizing Provider  acetaminophen (TYLENOL) 325 MG tablet Take 650 mg by mouth every 6 (six) hours as needed for mild pain.    Yes [provider]  albuterol (VENTOLIN HFA) 108 (90 Base) MCG/ACT inhaler Inhale 2 puffs into the lungs every 6 (six) hours as needed for wheezing or shortness of breath.  07/24/20  Yes Dwyane Dee, MD  atenolol (TENORMIN) 50 MG tablet Take 1 tablet (50 mg total) by mouth daily. 07/25/20  Yes Dwyane Dee, MD  clopidogrel (PLAVIX) 75 MG tablet TAKE 1 TABLET (75MG ) BY MOUTH EVERY DAY 12/08/19  Yes Tonia Ghent, MD  clotrimazole-betamethasone (LOTRISONE) cream APPLY TO AFFECTED AREA TWICE A DAY 06/04/20  Yes Tonia Ghent, MD  ezetimibe-simvastatin (VYTORIN) 10-40 MG tablet TAKE 1 TABLET BY MOUTH EVERY DAY 05/19/20  Yes Tonia Ghent, MD  furosemide (LASIX) 20 MG tablet Take 1 tablet (20 mg total) by mouth daily. Take an extra tab if weight is 147 or above in the AM. 08/01/20  Yes Tonia Ghent, MD  guaiFENesin (MUCINEX) 600 MG 12 hr tablet Take 600 mg by mouth 2 (two) times daily.    Yes [provider]  levothyroxine (SYNTHROID) 25 MCG tablet Take 2 tablets (50 mcg total) by mouth daily before breakfast. 03/29/20  Yes Tonia Ghent, MD  Multiple Vitamins-Minerals (CENTRUM SILVER PO) Take 1 tablet by mouth daily.   Yes [provider]  sitaGLIPtin-metformin (JANUMET) 50-500 MG tablet Take 1 tablet by mouth daily.   Yes [provider]     Review of Systems  Constitutional: Positive for fatigue. Negative for appetite change.  HENT: Positive for hearing loss and rhinorrhea. Negative for congestion and sore throat.   Eyes: Positive for visual disturbance (left eye macular degeneration).  Respiratory: Positive for shortness of breath. Negative for cough and chest tightness.   Cardiovascular: Positive for leg swelling (worsening). Negative for chest pain and palpitations.  Gastrointestinal: Negative for abdominal distention and abdominal pain.  Endocrine: Negative.   Genitourinary: Negative.   Musculoskeletal: Positive for arthralgias (leg pain) and back pain. Negative for neck pain.  Skin: Negative.   Allergic/Immunologic: Negative.   Neurological: Positive for light-headedness. Negative for dizziness.  Hematological:  Negative for adenopathy. Does not bruise/bleed easily.  Psychiatric/Behavioral: Positive for confusion (intermittently) and sleep disturbance (wearing oxygen). Negative for dysphoric mood. The patient is not nervous/anxious.    Vitals:   08/27/20 1123 08/27/20 1157  BP: (!) 88/64 113/65  Pulse: 78 76  Resp: 16   SpO2: 90%   Weight: 149 lb (67.6 kg)   Height: 5\' 6"  (1.676 m)    Wt Readings from Last 3 Encounters:  08/27/20 149 lb (67.6 kg)  08/26/20 151 lb 9 oz (68.7 kg)  08/06/20 147 lb (66.7 kg)    Lab Results  Component Value Date   CREATININE 1.11 08/26/2020   CREATININE 1.25 08/01/2020   CREATININE 1.18 07/24/2020    Physical Exam Vitals and nursing note reviewed.  Constitutional:      Appearance: Dylan Knight is  well-developed.  HENT:     Right Ear: Decreased hearing noted.     Left Ear: Decreased hearing noted.  Cardiovascular:     Rate and Rhythm: Normal rate and regular rhythm.  Pulmonary:     Effort: Pulmonary effort is normal. No respiratory distress.     Breath sounds: Rales (lower lobes) present. No rhonchi.  Abdominal:     Palpations: Abdomen is soft.     Tenderness: There is no abdominal tenderness.  Musculoskeletal:     Cervical back: Normal range of motion and neck supple.     Right lower leg: No tenderness. Edema (3+ pitting) present.     Left lower leg: No tenderness. Edema (3+ pitting) present.  Skin:    General: Skin is warm and dry.  Neurological:     General: No focal deficit present.     Mental Status: Dylan Knight is alert and oriented to person, place, and time.  Psychiatric:        Mood and Affect: Mood normal.        Behavior: Behavior normal.    Assessment & Plan:  1: Chronic heart failure with preserved ejection fraction with structural changes- - NYHA class III - + lymphedema, less rales today - weighing daily and Dylan Knight was reminded to call for an overnight weight gain of > 2 pounds or a weekly weight gain of > 5 pounds - weight down 2 pounds from  yesterday - not adding salt and wife tries to read food labels - BP will not tolerate adding entresto - has PT coming out twice weekly along with nursing once/ week - BNP re-drawn today and shown to be  BNP 1724.4, which is increased from 10/18 BNP of  1535.9  - reports receiving both pfizer covid vaccines along with flu vaccine  2: HTN- - B initially low at 88/64 but rechecked with manual cuff it was 113/65 - saw PCP Damita Dunnings) 08/01/20 - BMP 08/27/2020 reviewed showed sodium 133, potassium 3.8, creatinine 1.40 and GFR 43 -Open conversation regarding dietary habits and watching sodium in Dylan Knight morning hash browns, sausage, and Dylan Knight roast beef sandwich consumption. Encouraged a 2000mg  of sodium a day benchmark.   3: Pulmonary fibrosis- - saw pulmonology ([place]) 05/04/2019 and has appt scheduled on 09/12/20 with Dr. Patsey Berthold - currently wearing oxygen at 4L around the clock but wants to come off of it since Dylan Knight wasn't wearing it prior to recent admission - checks Dylan Knight oxygen level at home; advised that it needed to stay above 90% at rest and exertion  4: Lymphedema- - stage 2 - limited in Dylan Knight ability to exercise due to shortness of breath and oxygen tubing - doesn't elevate Dylan Knight legs like Dylan Knight should; encouraged Dylan Knight to elevate Dylan Knight legs when sitting for long periods of time - tried the compression socks but had great difficulty in getting them on; wife has ordered a pair that zips up so will try those once Dylan Knight receives them - Referral for compression boots placed and information given to patient and wife -Patient has notable weeping coming from legs when compressed to check for edema.  -patient referred to the wound care clinic due to weeping in legs.   Medication bottles were reviewed.   Return in 1 month or sooner for any questions/problems before then.

## 2020-08-27 NOTE — Telephone Encounter (Signed)
Allegra Lai with [facility] an [place]. Requesting status of CMN for oxygen that was sent to Dr Damita Dunnings. See 08/21/20 phone note.

## 2020-08-28 NOTE — Telephone Encounter (Signed)
Agree with NP student discussion.

## 2020-08-30 ENCOUNTER — Other Ambulatory Visit: Payer: Self-pay | Admitting: Family Medicine

## 2020-08-30 NOTE — Telephone Encounter (Signed)
Name from pharmacy: [facility]        Will file in chart as: atenolol-chlorthalidone (TENORETIC) 50-25 MG tablet   The original prescription was discontinued on 05/23/2020 by Tonia Ghent, MD. Renewing this prescription may not be appropriate.   Sig: TAKE 1/2 TABLET BY MOUTH EVERY DAY   Disp:  45 tablet  Refills:  2   Start: 08/30/2020   Class: Normal   Non-formulary   Last ordered: 8 months ago by Tonia Ghent, MD Last refill: 7/

## 2020-09-01 NOTE — Telephone Encounter (Signed)
Had been changed to atenolol without chlorthalidone.

## 2020-09-03 ENCOUNTER — Inpatient Hospital Stay
Admission: EM | Admit: 2020-09-03 | Discharge: 2020-09-09 | DRG: 602 | Disposition: A | Discharge status: Skilled Nursing Facility | Payer: Medicare PPO | Attending: Internal Medicine | Admitting: Internal Medicine

## 2020-09-03 ENCOUNTER — Encounter: Payer: Self-pay | Admitting: *Deleted

## 2020-09-03 ENCOUNTER — Other Ambulatory Visit: Payer: Self-pay

## 2020-09-03 ENCOUNTER — Emergency Department: Payer: Medicare PPO

## 2020-09-03 DIAGNOSIS — Z8673 Personal history of transient ischemic attack (TIA), and cerebral infarction without residual deficits: Secondary | ICD-10-CM

## 2020-09-03 DIAGNOSIS — Z809 Family history of malignant neoplasm, unspecified: Secondary | ICD-10-CM

## 2020-09-03 DIAGNOSIS — I071 Rheumatic tricuspid insufficiency: Secondary | ICD-10-CM | POA: Diagnosis present

## 2020-09-03 DIAGNOSIS — H3530 Unspecified macular degeneration: Secondary | ICD-10-CM | POA: Diagnosis present

## 2020-09-03 DIAGNOSIS — R600 Localized edema: Secondary | ICD-10-CM | POA: Diagnosis not present

## 2020-09-03 DIAGNOSIS — Z89421 Acquired absence of other right toe(s): Secondary | ICD-10-CM

## 2020-09-03 DIAGNOSIS — I5082 Biventricular heart failure: Secondary | ICD-10-CM | POA: Diagnosis present

## 2020-09-03 DIAGNOSIS — I2510 Atherosclerotic heart disease of native coronary artery without angina pectoris: Secondary | ICD-10-CM | POA: Diagnosis present

## 2020-09-03 DIAGNOSIS — I10 Essential (primary) hypertension: Secondary | ICD-10-CM | POA: Diagnosis not present

## 2020-09-03 DIAGNOSIS — I110 Hypertensive heart disease with heart failure: Secondary | ICD-10-CM | POA: Diagnosis not present

## 2020-09-03 DIAGNOSIS — J449 Chronic obstructive pulmonary disease, unspecified: Secondary | ICD-10-CM | POA: Diagnosis not present

## 2020-09-03 DIAGNOSIS — J9601 Acute respiratory failure with hypoxia: Secondary | ICD-10-CM | POA: Diagnosis not present

## 2020-09-03 DIAGNOSIS — Z79899 Other long term (current) drug therapy: Secondary | ICD-10-CM

## 2020-09-03 DIAGNOSIS — E785 Hyperlipidemia, unspecified: Secondary | ICD-10-CM | POA: Diagnosis present

## 2020-09-03 DIAGNOSIS — L0390 Cellulitis, unspecified: Secondary | ICD-10-CM | POA: Diagnosis not present

## 2020-09-03 DIAGNOSIS — Z888 Allergy status to other drugs, medicaments and biological substances status: Secondary | ICD-10-CM

## 2020-09-03 DIAGNOSIS — Z66 Do not resuscitate: Secondary | ICD-10-CM | POA: Diagnosis not present

## 2020-09-03 DIAGNOSIS — M2550 Pain in unspecified joint: Secondary | ICD-10-CM | POA: Diagnosis not present

## 2020-09-03 DIAGNOSIS — Z823 Family history of stroke: Secondary | ICD-10-CM | POA: Diagnosis not present

## 2020-09-03 DIAGNOSIS — Z87891 Personal history of nicotine dependence: Secondary | ICD-10-CM | POA: Diagnosis not present

## 2020-09-03 DIAGNOSIS — I5031 Acute diastolic (congestive) heart failure: Secondary | ICD-10-CM | POA: Diagnosis not present

## 2020-09-03 DIAGNOSIS — J849 Interstitial pulmonary disease, unspecified: Secondary | ICD-10-CM | POA: Diagnosis present

## 2020-09-03 DIAGNOSIS — N182 Chronic kidney disease, stage 2 (mild): Secondary | ICD-10-CM | POA: Diagnosis present

## 2020-09-03 DIAGNOSIS — R911 Solitary pulmonary nodule: Secondary | ICD-10-CM | POA: Diagnosis not present

## 2020-09-03 DIAGNOSIS — R0602 Shortness of breath: Secondary | ICD-10-CM | POA: Diagnosis not present

## 2020-09-03 DIAGNOSIS — Z85828 Personal history of other malignant neoplasm of skin: Secondary | ICD-10-CM | POA: Diagnosis not present

## 2020-09-03 DIAGNOSIS — I509 Heart failure, unspecified: Secondary | ICD-10-CM | POA: Diagnosis not present

## 2020-09-03 DIAGNOSIS — Z7401 Bed confinement status: Secondary | ICD-10-CM | POA: Diagnosis not present

## 2020-09-03 DIAGNOSIS — I872 Venous insufficiency (chronic) (peripheral): Secondary | ICD-10-CM | POA: Diagnosis present

## 2020-09-03 DIAGNOSIS — I5043 Acute on chronic combined systolic (congestive) and diastolic (congestive) heart failure: Secondary | ICD-10-CM

## 2020-09-03 DIAGNOSIS — I2781 Cor pulmonale (chronic): Secondary | ICD-10-CM | POA: Diagnosis present

## 2020-09-03 DIAGNOSIS — I6529 Occlusion and stenosis of unspecified carotid artery: Secondary | ICD-10-CM | POA: Diagnosis present

## 2020-09-03 DIAGNOSIS — M1990 Unspecified osteoarthritis, unspecified site: Secondary | ICD-10-CM | POA: Diagnosis present

## 2020-09-03 DIAGNOSIS — N400 Enlarged prostate without lower urinary tract symptoms: Secondary | ICD-10-CM | POA: Diagnosis present

## 2020-09-03 DIAGNOSIS — E876 Hypokalemia: Secondary | ICD-10-CM | POA: Diagnosis present

## 2020-09-03 DIAGNOSIS — J439 Emphysema, unspecified: Secondary | ICD-10-CM | POA: Diagnosis present

## 2020-09-03 DIAGNOSIS — I2721 Secondary pulmonary arterial hypertension: Secondary | ICD-10-CM | POA: Diagnosis present

## 2020-09-03 DIAGNOSIS — R5381 Other malaise: Secondary | ICD-10-CM | POA: Diagnosis not present

## 2020-09-03 DIAGNOSIS — J84112 Idiopathic pulmonary fibrosis: Secondary | ICD-10-CM | POA: Diagnosis present

## 2020-09-03 DIAGNOSIS — Z7984 Long term (current) use of oral hypoglycemic drugs: Secondary | ICD-10-CM | POA: Diagnosis not present

## 2020-09-03 DIAGNOSIS — I5020 Unspecified systolic (congestive) heart failure: Secondary | ICD-10-CM | POA: Diagnosis not present

## 2020-09-03 DIAGNOSIS — I50810 Right heart failure, unspecified: Secondary | ICD-10-CM | POA: Diagnosis not present

## 2020-09-03 DIAGNOSIS — I5021 Acute systolic (congestive) heart failure: Secondary | ICD-10-CM | POA: Diagnosis not present

## 2020-09-03 DIAGNOSIS — E039 Hypothyroidism, unspecified: Secondary | ICD-10-CM | POA: Diagnosis not present

## 2020-09-03 DIAGNOSIS — I130 Hypertensive heart and chronic kidney disease with heart failure and stage 1 through stage 4 chronic kidney disease, or unspecified chronic kidney disease: Secondary | ICD-10-CM | POA: Diagnosis not present

## 2020-09-03 DIAGNOSIS — G629 Polyneuropathy, unspecified: Secondary | ICD-10-CM | POA: Diagnosis present

## 2020-09-03 DIAGNOSIS — J189 Pneumonia, unspecified organism: Secondary | ICD-10-CM | POA: Diagnosis not present

## 2020-09-03 DIAGNOSIS — Z9981 Dependence on supplemental oxygen: Secondary | ICD-10-CM | POA: Diagnosis not present

## 2020-09-03 DIAGNOSIS — Z8249 Family history of ischemic heart disease and other diseases of the circulatory system: Secondary | ICD-10-CM | POA: Diagnosis not present

## 2020-09-03 DIAGNOSIS — Z20822 Contact with and (suspected) exposure to covid-19: Secondary | ICD-10-CM | POA: Diagnosis present

## 2020-09-03 DIAGNOSIS — J441 Chronic obstructive pulmonary disease with (acute) exacerbation: Secondary | ICD-10-CM | POA: Diagnosis present

## 2020-09-03 DIAGNOSIS — J9621 Acute and chronic respiratory failure with hypoxia: Secondary | ICD-10-CM | POA: Diagnosis not present

## 2020-09-03 DIAGNOSIS — E1159 Type 2 diabetes mellitus with other circulatory complications: Secondary | ICD-10-CM | POA: Diagnosis not present

## 2020-09-03 DIAGNOSIS — R011 Cardiac murmur, unspecified: Secondary | ICD-10-CM | POA: Diagnosis present

## 2020-09-03 DIAGNOSIS — E1151 Type 2 diabetes mellitus with diabetic peripheral angiopathy without gangrene: Secondary | ICD-10-CM | POA: Diagnosis present

## 2020-09-03 DIAGNOSIS — E1122 Type 2 diabetes mellitus with diabetic chronic kidney disease: Secondary | ICD-10-CM | POA: Diagnosis present

## 2020-09-03 DIAGNOSIS — I959 Hypotension, unspecified: Secondary | ICD-10-CM | POA: Diagnosis present

## 2020-09-03 DIAGNOSIS — J8410 Pulmonary fibrosis, unspecified: Secondary | ICD-10-CM | POA: Diagnosis not present

## 2020-09-03 DIAGNOSIS — J432 Centrilobular emphysema: Secondary | ICD-10-CM | POA: Diagnosis not present

## 2020-09-03 DIAGNOSIS — Z79890 Hormone replacement therapy (postmenopausal): Secondary | ICD-10-CM

## 2020-09-03 DIAGNOSIS — R609 Edema, unspecified: Secondary | ICD-10-CM | POA: Diagnosis not present

## 2020-09-03 DIAGNOSIS — I50811 Acute right heart failure: Secondary | ICD-10-CM | POA: Diagnosis not present

## 2020-09-03 DIAGNOSIS — I272 Pulmonary hypertension, unspecified: Secondary | ICD-10-CM | POA: Diagnosis not present

## 2020-09-03 DIAGNOSIS — Z7902 Long term (current) use of antithrombotics/antiplatelets: Secondary | ICD-10-CM

## 2020-09-03 DIAGNOSIS — E119 Type 2 diabetes mellitus without complications: Secondary | ICD-10-CM

## 2020-09-03 LAB — CBC
HCT: 43.2 % (ref 39.0–52.0)
HCT: 45.3 % (ref 39.0–52.0)
Hemoglobin: 14.6 g/dL (ref 13.0–17.0)
Hemoglobin: 15.3 g/dL (ref 13.0–17.0)
MCH: 34.9 pg — ABNORMAL HIGH (ref 26.0–34.0)
MCH: 35.2 pg — ABNORMAL HIGH (ref 26.0–34.0)
MCHC: 33.8 g/dL (ref 30.0–36.0)
MCHC: 33.8 g/dL (ref 30.0–36.0)
MCV: 103.3 fL — ABNORMAL HIGH (ref 80.0–100.0)
MCV: 104.1 fL — ABNORMAL HIGH (ref 80.0–100.0)
Platelets: 201 10*3/uL (ref 150–400)
Platelets: 200 10*3/uL (ref 150–400)
RBC: 4.18 MIL/uL — ABNORMAL LOW (ref 4.22–5.81)
RBC: 4.35 MIL/uL (ref 4.22–5.81)
RDW: 14.4 % (ref 11.5–15.5)
RDW: 14.2 % (ref 11.5–15.5)
WBC: 8.2 10*3/uL (ref 4.0–10.5)
WBC: 8.3 10*3/uL (ref 4.0–10.5)
nRBC: 0 % (ref 0.0–0.2)
nRBC: 0 % (ref 0.0–0.2)

## 2020-09-03 LAB — TROPONIN I (HIGH SENSITIVITY)
Troponin I (High Sensitivity): 14 ng/L (ref <18)
Troponin I (High Sensitivity): 17 ng/L (ref <18)

## 2020-09-03 LAB — RESPIRATORY PANEL BY RT PCR (FLU A&B, COVID)
Influenza A by PCR: NEGATIVE
Influenza B by PCR: NEGATIVE
SARS Coronavirus 2 by RT PCR: NEGATIVE

## 2020-09-03 LAB — BASIC METABOLIC PANEL
Anion gap: 10 (ref 5–15)
BUN: 22 mg/dL (ref 8–23)
CO2: 27 mmol/L (ref 22–32)
Calcium: 9.4 mg/dL (ref 8.9–10.3)
Chloride: 98 mmol/L (ref 98–111)
Creatinine, Ser: 1.5 mg/dL — ABNORMAL HIGH (ref 0.61–1.24)
GFR, Estimated: 43 mL/min — ABNORMAL LOW (ref >60)
Glucose, Bld: 111 mg/dL — ABNORMAL HIGH (ref 70–99)
Potassium: 4.1 mmol/L (ref 3.5–5.1)
Sodium: 135 mmol/L (ref 135–145)

## 2020-09-03 LAB — CREATININE, SERUM
Creatinine, Ser: 1.43 mg/dL — ABNORMAL HIGH (ref 0.61–1.24)
GFR, Estimated: 45 mL/min — ABNORMAL LOW (ref >60)

## 2020-09-03 LAB — BRAIN NATRIURETIC PEPTIDE: B Natriuretic Peptide: 1496.3 pg/mL — ABNORMAL HIGH (ref 0.0–100.0)

## 2020-09-03 MED ORDER — ONDANSETRON HCL 4 MG/2ML IJ SOLN: 4.0000 mg | Freq: Four times a day (QID) | INTRAMUSCULAR | Status: DC | PRN

## 2020-09-03 MED ORDER — INSULIN ASPART 100 UNIT/ML ~~LOC~~ SOLN
0.0000 [IU] | Freq: Three times a day (TID) | SUBCUTANEOUS | Status: DC
  Administered 2020-09-05: 1 [IU] via SUBCUTANEOUS
  Administered 2020-09-05 – 2020-09-06 (×2): 2 [IU] via SUBCUTANEOUS
  Administered 2020-09-06: 1 [IU] via SUBCUTANEOUS
  Administered 2020-09-07: 2 [IU] via SUBCUTANEOUS
  Administered 2020-09-08 – 2020-09-09 (×3): 1 [IU] via SUBCUTANEOUS
  Filled 2020-09-03 (×8): qty 1

## 2020-09-03 MED ORDER — ACETAMINOPHEN 325 MG PO TABS
650.0000 mg | ORAL_TABLET | ORAL | Status: DC | PRN
  Administered 2020-09-03 – 2020-09-08 (×7): 650 mg via ORAL
  Filled 2020-09-03 (×8): qty 2

## 2020-09-03 MED ORDER — FUROSEMIDE 10 MG/ML IJ SOLN
40.0000 mg | Freq: Two times a day (BID) | INTRAMUSCULAR | Status: DC
  Administered 2020-09-04 – 2020-09-08 (×8): 40 mg via INTRAVENOUS
  Filled 2020-09-03 (×11): qty 4

## 2020-09-03 MED ORDER — INSULIN ASPART 100 UNIT/ML ~~LOC~~ SOLN
0.0000 [IU] | Freq: Every day | SUBCUTANEOUS | Status: DC
  Administered 2020-09-04: 5 [IU] via SUBCUTANEOUS
  Administered 2020-09-05: 2 [IU] via SUBCUTANEOUS
  Filled 2020-09-03 (×2): qty 1

## 2020-09-03 MED ORDER — SODIUM CHLORIDE 0.9% FLUSH
3.0000 mL | Freq: Two times a day (BID) | INTRAVENOUS | Status: DC
  Administered 2020-09-03 – 2020-09-08 (×11): 3 mL via INTRAVENOUS

## 2020-09-03 MED ORDER — FUROSEMIDE 10 MG/ML IJ SOLN
40.0000 mg | Freq: Once | INTRAMUSCULAR | Status: AC
  Administered 2020-09-03: 40 mg via INTRAVENOUS
  Filled 2020-09-03: qty 4

## 2020-09-03 MED ORDER — SODIUM CHLORIDE 0.9 % IV SOLN: 250.0000 mL | INTRAVENOUS | Status: DC | PRN

## 2020-09-03 MED ORDER — SODIUM CHLORIDE 0.9% FLUSH: 3.0000 mL | INTRAVENOUS | Status: DC | PRN

## 2020-09-03 MED ORDER — ENOXAPARIN SODIUM 30 MG/0.3ML ~~LOC~~ SOLN
30.0000 mg | Freq: Every day | SUBCUTANEOUS | Status: DC
  Administered 2020-09-03 – 2020-09-04 (×2): 30 mg via SUBCUTANEOUS
  Filled 2020-09-03 (×2): qty 0.3

## 2020-09-03 NOTE — ED Provider Notes (Signed)
[facility] Emergency Department Provider Note    First MD Initiated Contact with Patient 09/03/20 2153     (approximate)  I have reviewed the triage vital signs and the nursing notes.   HISTORY  Chief Complaint Cellulitis    HPI METRO EDENFIELD is a 86 y.o. male past medical history as listed below presents to the ER for worsening shortness of breath and orthopnea as well as bilateral lower extremity swelling.  Patient diagnosis of CHF and wears 4 L nasal cannula at home.  Has increased his Lasix at home but still having significant weight gain and worsening symptoms.  Dry weight is typically 140 today weighed 150.  Does not feel like his home regimen is producing the same amount of diuresis as previously.  He is not complaining of any chest pain.  There is no report of any choking event or aspiration.  No measured fevers or chills.    Past Medical History:  Diagnosis Date  . Arthritis    left hip  . BPH (benign prostatic hyperplasia)   . Cancer [facility])    Skin cancers removed  . Carotid artery occlusion   . CHF (congestive heart failure) ([place])   . COPD (chronic obstructive pulmonary disease) ([place])   . Diabetes mellitus without complication ([place])   . Hyperlipidemia   . Hypertension   . Hypothyroidism   . Macular degeneration   . Peripheral arterial disease ([place]) 2002  . Pulmonary fibrosis ([place])   . Stroke [facility]) 2005   Family History  Problem Relation Age of Onset  . Stroke Mother   . Hypertension Mother   . Heart disease Father   . Heart attack Father   . Cancer Brother   . Hypertension Brother    Past Surgical History:  Procedure Laterality Date  . ABDOMINAL AORTIC ANEURYSM REPAIR  2003   Aorto-left femoral-right iliac BPG by Dr. Scot Dock  . Aortic aneursym surgery     2003- Dr. Scot Dock  . BACK SURGERY     1960  . CAROTID ENDARTERECTOMY  Jan. 25,2007   Right CEA  . Carotid stenosis surgery on right     2007  . CARPAL TUNNEL RELEASE  Left 06/02/2016   Procedure: LEFT CARPAL TUNNEL RELEASE;  Surgeon: Daryll Brod, MD;  Location: [place];  Service: Orthopedics;  Laterality: Left;  . EYE SURGERY     Catarct surgery and lens implant - bilateral  . PR VEIN BYPASS GRAFT,AORTO-FEM-POP  2005   Revision Right Fem-pop  . PR VEIN BYPASS GRAFT,AORTO-FEM-POP  2002   Right Fem-pop  . RADIOLOGY WITH ANESTHESIA N/A 12/02/2016   Procedure: MRI LUMBAR SPINE WITHOUT;  Surgeon: Medication Radiologist, MD;  Location: [place];  Service: Radiology;  Laterality: N/A;  . Right leg bypass     2002 -  and amputation of right fifth toe  . SPINE SURGERY  1960   lumbar spine  . [place], 2007   cervical spine  . TOE AMPUTATION  08-2001   right 5th toe amputation   Patient Active Problem List   Diagnosis Date Noted  . ILD (interstitial lung disease) ([place]) 07/23/2020  . Acute respiratory failure with hypoxia ([place])   . Dyspnea 07/19/2020  . CHF exacerbation ([place]) 07/19/2020  . Edema 05/26/2020  . ETD (eustachian tube dysfunction) 04/24/2020  . Insomnia 10/15/2019  . Medicare annual wellness visit, subsequent 07/17/2019  . Tinea cruris 02/12/2019  . Urinary retention 02/10/2019  . Dysuria 02/07/2019  .  Restlessness 01/23/2019  . At risk for falls 07/21/2018  . Laceration of left hand 11/13/2017  . Gait abnormality 05/19/2017  . Hyperlipemia 03/29/2017  . HTN (hypertension) 03/29/2017  . Advance care planning 02/25/2017  . Macular degeneration 02/25/2017  . Transient speech disturbance   . Bilateral carotid artery stenosis   . History of stroke   . TIA (transient ischemic attack) 01/30/2017  . Acute ischemic stroke ([place]) 01/30/2017  . Low back pain with right-sided sciatica 12/01/2016  . Diabetes mellitus type 2, controlled ([place]) 12/01/2016  . CAD (coronary artery disease) 12/01/2016  . Hypothyroidism 12/01/2016  . Back pain 11/02/2016  . Aftercare following surgery of the circulatory system, [place] 06/28/2013   . Cough 04/18/2013  . COPD (chronic obstructive pulmonary disease) ([place]) 11/22/2012  . Occlusion and stenosis of carotid artery without mention of cerebral infarction 06/29/2012  . Peripheral vascular disease ([place]) 12/30/2011      Prior to Admission medications   Medication Sig Start Date End Date Taking? Authorizing Provider  acetaminophen (TYLENOL) 325 MG tablet Take 650 mg by mouth every 6 (six) hours as needed for mild pain.     [provider]  albuterol (VENTOLIN HFA) 108 (90 Base) MCG/ACT inhaler Inhale 2 puffs into the lungs every 6 (six) hours as needed for wheezing or shortness of breath. 07/24/20   Dwyane Dee, MD  atenolol (TENORMIN) 50 MG tablet Take 1 tablet (50 mg total) by mouth daily. 07/25/20   Dwyane Dee, MD  clopidogrel (PLAVIX) 75 MG tablet TAKE 1 TABLET (75MG ) BY MOUTH EVERY DAY 12/08/19   Tonia Ghent, MD  clotrimazole-betamethasone (LOTRISONE) cream APPLY TO AFFECTED AREA TWICE A DAY 06/04/20   Tonia Ghent, MD  ezetimibe-simvastatin (VYTORIN) 10-40 MG tablet TAKE 1 TABLET BY MOUTH EVERY DAY 05/19/20   Tonia Ghent, MD  furosemide (LASIX) 20 MG tablet Take 1 tablet (20 mg total) by mouth daily. Take an extra tab if weight is 147 or above in the AM. 08/01/20   Tonia Ghent, MD  guaiFENesin (MUCINEX) 600 MG 12 hr tablet Take 600 mg by mouth 2 (two) times daily.     [provider]  levothyroxine (SYNTHROID) 25 MCG tablet Take 2 tablets (50 mcg total) by mouth daily before breakfast. 03/29/20   Tonia Ghent, MD  Multiple Vitamins-Minerals (CENTRUM SILVER PO) Take 1 tablet by mouth daily.    [provider]  sitaGLIPtin-metformin (JANUMET) 50-500 MG tablet Take 1 tablet by mouth daily.    [provider]    Allergies Iohexol and Iodine    Social History Social History   Tobacco Use  . Smoking status: Former Smoker    Packs/day: 1.00    Years: 63.00    Pack years: 63.00    Types: Cigarettes    Quit date:  04/09/2004    Years since quitting: 16.4  . Smokeless tobacco: Former Systems developer  . Tobacco comment: Quit smoking in 2005  Vaping Use  . Vaping Use: Never used  Substance Use Topics  . Alcohol use: No    Alcohol/week: 0.0 standard drinks  . Drug use: No    Review of Systems Patient denies headaches, rhinorrhea, blurry vision, numbness, shortness of breath, chest pain, edema, cough, abdominal pain, nausea, vomiting, diarrhea, dysuria, fevers, rashes or hallucinations unless otherwise stated above in HPI. ____________________________________________   PHYSICAL EXAM:  VITAL SIGNS: Vitals:   09/03/20 1707  BP: 131/67  Pulse: 77  Resp: 20  Temp: 98.4 F (36.9 C)  SpO2: 100%    Constitutional: Alert and oriented.  Eyes: Conjunctivae are normal.  Head: Atraumatic. Nose: No congestion/rhinnorhea. Mouth/Throat: Mucous membranes are moist.   Neck: No stridor. Painless ROM.  Cardiovascular: Normal rate, regular rhythm. Grossly normal heart sounds.  Good peripheral circulation. Respiratory: mild tachypnea.  No retractions. Lungs posterior crackles Gastrointestinal: Soft and nontender. No distention. No abdominal bruits. No CVA tenderness. Genitourinary:  Musculoskeletal: No lower extremity tenderness.  3+ BLE pitting and weeping edema with venous stasis ulcerations.  No joint effusions. Neurologic:  Normal speech and language. No gross focal neurologic deficits are appreciated. No facial droop Skin:  Skin is warm, dry and intact. No rash noted. Psychiatric: Mood and affect are normal. Speech and behavior are normal.  ____________________________________________   LABS (all labs ordered are listed, but only abnormal results are displayed)  Results for orders placed or performed during the hospital encounter of 09/03/20 (from the past 24 hour(s))  Basic metabolic panel     Status: Abnormal   Collection Time: 09/03/20  5:08 PM  Result Value Ref Range   Sodium 135 135 - 145 mmol/L    Potassium 4.1 3.5 - 5.1 mmol/L   Chloride 98 98 - 111 mmol/L   CO2 27 22 - 32 mmol/L   Glucose, Bld 111 (H) 70 - 99 mg/dL   BUN 22 8 - 23 mg/dL   Creatinine, Ser 1.50 (H) 0.61 - 1.24 mg/dL   Calcium 9.4 8.9 - 10.3 mg/dL   GFR, Estimated 43 (L) >60 mL/min   Anion gap 10 5 - 15  CBC     Status: Abnormal   Collection Time: 09/03/20  5:08 PM  Result Value Ref Range   WBC 8.2 4.0 - 10.5 K/uL   RBC 4.18 (L) 4.22 - 5.81 MIL/uL   Hemoglobin 14.6 13.0 - 17.0 g/dL   HCT 43.2 39 - 52 %   MCV 103.3 (H) 80.0 - 100.0 fL   MCH 34.9 (H) 26.0 - 34.0 pg   MCHC 33.8 30.0 - 36.0 g/dL   RDW 14.4 11.5 - 15.5 %   Platelets 201 150 - 400 K/uL   nRBC 0.0 0.0 - 0.2 %  Troponin I (High Sensitivity)     Status: None   Collection Time: 09/03/20  5:08 PM  Result Value Ref Range   Troponin I (High Sensitivity) 14 <18 ng/L   ____________________________________________  EKG My review and personal interpretation at Time: 17:13   Indication: sob  Rate: 80  Rhythm: sinus Axis: left Other: nonspecific st abn, no stemi ____________________________________________  RADIOLOGY  I personally reviewed all radiographic images ordered to evaluate for the above acute complaints and reviewed radiology reports and findings.  These findings were personally discussed with the patient.  Please see medical record for radiology report.  ____________________________________________   PROCEDURES  Procedure(s) performed:  Procedures    Critical Care performed: no ____________________________________________   INITIAL IMPRESSION / ASSESSMENT AND PLAN / ED COURSE  Pertinent labs & imaging results that were available during my care of the patient were reviewed by me and considered in my medical decision making (see chart for details).   DDX: Asthma, copd, CHF, pna, ptx, malignancy, Pe, anemia   Dylan Knight is a 86 y.o. who presents to the ED with presentation as described above.  Patient protecting his airway  no worsening hypoxia on his home O2 but does have exam findings concerning for worsening CHF and mild anasarca.  He has been compliant with his furosemide  at home and including increasing it over the past several days but having worsening symptoms therefore does appear to have worsening CHF failing outpatient management.  Chest x-ray read as possible aspiration or pneumonia.  I do not see findings to suggest pneumonia at this time I think it is clinically more consistent with CHF.  He is not febrile no white count.  Will order IV Lasix.  Will discuss with hospitalist for admission     The patient was evaluated in Emergency Department today for the symptoms described in the history of present illness. He/she was evaluated in the context of the global COVID-19 pandemic, which necessitated consideration that the patient might be at risk for infection with the SARS-CoV-2 virus that causes COVID-19. Institutional protocols and algorithms that pertain to the evaluation of patients at risk for COVID-19 are in a state of rapid change based on information released by regulatory bodies including the CDC and federal and state organizations. These policies and algorithms were followed during the patient's care in the ED.  As part of my medical decision making, I reviewed the following data within the [facility] notes reviewed and incorporated, Labs reviewed, notes from prior ED visits and [place] Controlled Substance Database   ____________________________________________   FINAL CLINICAL IMPRESSION(S) / ED DIAGNOSES  Final diagnoses:  Acute congestive heart failure, unspecified heart failure type (HCC)  Edema, unspecified type      NEW MEDICATIONS STARTED DURING THIS VISIT:  New Prescriptions   No medications on file     Note:  This document was prepared using Dragon voice recognition software and may include unintentional dictation errors.    Merlyn Lot, MD 09/03/20  2230

## 2020-09-03 NOTE — Progress Notes (Signed)
PHARMACIST - PHYSICIAN COMMUNICATION  CONCERNING:  Enoxaparin (Lovenox) for DVT Prophylaxis    RECOMMENDATION: Patient was prescribed enoxaparin 40mg  q24 hours for VTE prophylaxis.   Filed Weights   09/03/20 1704  Weight: 72.6 kg (160 lb)    Body mass index is 25.82 kg/m.  Estimated Creatinine Clearance: 27.2 mL/min (A) (by C-G formula based on SCr of 1.5 mg/dL (H)).  Patient is candidate for enoxaparin 30mg  every 24 hours based on CrCl <4ml/min or Weight <45kg  DESCRIPTION: Pharmacy has adjusted enoxaparin dose per [facility] policy.  Patient is now receiving enoxaparin 30 mg every 24 hours    Benita Gutter 09/03/2020 11:00 PM

## 2020-09-03 NOTE — H&P (Signed)
History and Physical   Dylan Knight:[id number] DOB: 1923-12-02 DOA: 09/03/2020  Referring MD/NP/PA: Dr. Quentin Cornwall  PCP: Tonia Ghent, MD   Outpatient Specialists: [facility] cardiology  Patient coming from: Home  Chief Complaint: Cellulitis and swelling of bilateral lower extremity  HPI: Dylan Knight is a 86 y.o. male with medical history significant of known systolic CHF, diabetes, hyperlipidemia, hypertension, hypothyroidism, peripheral vascular disease, pulmonary fibrosis BPH, osteoarthritis, hyperlipidemia and COPD who has been having significant weight gain and fluid retention over the last couple of weeks.  Patient has had increased dose of his home Lasix but continues to be significantly fluid overloaded.  He has had anasarca.  Currently over 10 pounds above his usual weight.  Came to the ER with progressive shortness of breath and worsening lower extremity edema..  Denied any sick contact.  No fever or chills no significant cough.  Patient seen and evaluated and appears to have anasarca consistent with worsening CHF.  He is being admitted to the hospital for further evaluation and treatment.  Patient has been communicated with his primary care physician was outpatient treatment and adjustment of medications attempted but has not been successful so he has failed outpatient treatment..  ED Course: Temperature 98.4 blood pressure 143/80 pulse 83 respirate 20 oxygen sat 90% 2 L.  CBC sent chemistry mostly within normal except creatinine 1.43 and glucose 111.  Chest x-ray showed interstitial lung disease consistent with known interstitial pneumonia.  COVID-19 and viral screen currently pending.  Admitted for further treatment.  Review of Systems: As per HPI otherwise 10 point review of systems negative.    Past Medical History:  Diagnosis Date  . Arthritis    left hip  . BPH (benign prostatic hyperplasia)   . Cancer [facility])    Skin cancers removed  .  Carotid artery occlusion   . CHF (congestive heart failure) ([place])   . COPD (chronic obstructive pulmonary disease) ([place])   . Diabetes mellitus without complication ([place])   . Hyperlipidemia   . Hypertension   . Hypothyroidism   . Macular degeneration   . Peripheral arterial disease ([place]) 2002  . Pulmonary fibrosis ([place])   . Stroke [facility]) 2005    Past Surgical History:  Procedure Laterality Date  . ABDOMINAL AORTIC ANEURYSM REPAIR  2003   Aorto-left femoral-right iliac BPG by Dr. Scot Dock  . Aortic aneursym surgery     2003- Dr. Scot Dock  . BACK SURGERY     1960  . CAROTID ENDARTERECTOMY  Jan. 25,2007   Right CEA  . Carotid stenosis surgery on right     2007  . CARPAL TUNNEL RELEASE Left 06/02/2016   Procedure: LEFT CARPAL TUNNEL RELEASE;  Surgeon: Daryll Brod, MD;  Location: [place];  Service: Orthopedics;  Laterality: Left;  . EYE SURGERY     Catarct surgery and lens implant - bilateral  . PR VEIN BYPASS GRAFT,AORTO-FEM-POP  2005   Revision Right Fem-pop  . PR VEIN BYPASS GRAFT,AORTO-FEM-POP  2002   Right Fem-pop  . RADIOLOGY WITH ANESTHESIA N/A 12/02/2016   Procedure: MRI LUMBAR SPINE WITHOUT;  Surgeon: Medication Radiologist, MD;  Location: [place];  Service: Radiology;  Laterality: N/A;  . Right leg bypass     2002 -  and amputation of right fifth toe  . SPINE SURGERY  1960   lumbar spine  . [place], 2007   cervical spine  . TOE AMPUTATION  08-2001   right 5th  toe amputation     reports that he quit smoking about 16 years ago. His smoking use included cigarettes. He has a 63.00 pack-year smoking history. He has quit using smokeless tobacco. He reports that he does not drink alcohol and does not use drugs.  Allergies  Allergen Reactions  . Iohexol Hives and Other (See Comments)    Went away with benadryl   . Iodine Rash    Family History  Problem Relation Age of Onset  . Stroke Mother   . Hypertension Mother   . Heart disease Father    . Heart attack Father   . Cancer Brother   . Hypertension Brother      Prior to Admission medications   Medication Sig Start Date End Date Taking? Authorizing Provider  acetaminophen (TYLENOL) 325 MG tablet Take 650 mg by mouth every 6 (six) hours as needed for mild pain.     [provider]  albuterol (VENTOLIN HFA) 108 (90 Base) MCG/ACT inhaler Inhale 2 puffs into the lungs every 6 (six) hours as needed for wheezing or shortness of breath. 07/24/20   Dwyane Dee, MD  atenolol (TENORMIN) 50 MG tablet Take 1 tablet (50 mg total) by mouth daily. 07/25/20   Dwyane Dee, MD  clopidogrel (PLAVIX) 75 MG tablet TAKE 1 TABLET (75MG ) BY MOUTH EVERY DAY 12/08/19   Tonia Ghent, MD  clotrimazole-betamethasone (LOTRISONE) cream APPLY TO AFFECTED AREA TWICE A DAY 06/04/20   Tonia Ghent, MD  ezetimibe-simvastatin (VYTORIN) 10-40 MG tablet TAKE 1 TABLET BY MOUTH EVERY DAY 05/19/20   Tonia Ghent, MD  furosemide (LASIX) 20 MG tablet Take 1 tablet (20 mg total) by mouth daily. Take an extra tab if weight is 147 or above in the AM. 08/01/20   Tonia Ghent, MD  guaiFENesin (MUCINEX) 600 MG 12 hr tablet Take 600 mg by mouth 2 (two) times daily.     [provider]  levothyroxine (SYNTHROID) 25 MCG tablet Take 2 tablets (50 mcg total) by mouth daily before breakfast. 03/29/20   Tonia Ghent, MD  Multiple Vitamins-Minerals (CENTRUM SILVER PO) Take 1 tablet by mouth daily.    [provider]  sitaGLIPtin-metformin (JANUMET) 50-500 MG tablet Take 1 tablet by mouth daily.    [provider]    Physical Exam: Vitals:   09/03/20 1704 09/03/20 1707 09/03/20 2200  BP:  131/67 (!) 143/80  Pulse:  77 83  Resp:  20 20  Temp:  98.4 F (36.9 C)   TempSrc:  Oral   SpO2:  100% 100%  Weight: 72.6 kg    Height: 5\' 6"  (1.676 m)        Constitutional: Acutely ill looking, anasarca Vitals:   09/03/20 1704 09/03/20 1707 09/03/20 2200  BP:  131/67 (!) 143/80   Pulse:  77 83  Resp:  20 20  Temp:  98.4 F (36.9 C)   TempSrc:  Oral   SpO2:  100% 100%  Weight: 72.6 kg    Height: 5\' 6"  (1.676 m)     Eyes: PERRL, lids and conjunctivae normal ENMT: Mucous membranes are moist. Posterior pharynx clear of any exudate or lesions.Normal dentition.  Neck: normal, supple, no masses, no thyromegaly Respiratory: Coarse breath sound bilaterally, diffuse crackles rhonchi, normal respiratory effort. No accessory muscle use.  Cardiovascular: Regular rate and rhythm, no murmurs / rubs / gallops. 2+ extremity edema. 2+ pedal pulses. No carotid bruits.  Abdomen: no tenderness, no masses palpated. No hepatosplenomegaly. Bowel sounds  positive.  Musculoskeletal: no clubbing / cyanosis. No joint deformity upper and lower extremities. Good ROM, no contractures. Normal muscle tone.  Skin: no rashes, lesions, ulcers. No induration Neurologic: CN 2-12 grossly intact. Sensation intact, DTR normal. Strength 5/5 in all 4.  Psychiatric: Normal judgment and insight. Alert and oriented x 3. Normal mood.     Labs on Admission: I have personally reviewed following labs and imaging studies  CBC: Recent Labs  Lab 09/03/20 1708  WBC 8.2  HGB 14.6  HCT 43.2  MCV 103.3*  PLT 782   Basic Metabolic Panel: Recent Labs  Lab 09/03/20 1708  NA 135  K 4.1  CL 98  CO2 27  GLUCOSE 111*  BUN 22  CREATININE 1.50*  CALCIUM 9.4   GFR: Estimated Creatinine Clearance: 27.2 mL/min (A) (by C-G formula based on SCr of 1.5 mg/dL (H)). Liver Function Tests: No results for input(s): AST, ALT, ALKPHOS, BILITOT, PROT, ALBUMIN in the last 168 hours. No results for input(s): LIPASE, AMYLASE in the last 168 hours. No results for input(s): AMMONIA in the last 168 hours. Coagulation Profile: No results for input(s): INR, PROTIME in the last 168 hours. Cardiac Enzymes: No results for input(s): CKTOTAL, CKMB, CKMBINDEX, TROPONINI in the last 168 hours. BNP (last 3 results) Recent Labs     08/01/20 1122  PROBNP 1,229.0*   HbA1C: No results for input(s): HGBA1C in the last 72 hours. CBG: No results for input(s): GLUCAP in the last 168 hours. Lipid Profile: No results for input(s): CHOL, HDL, LDLCALC, TRIG, CHOLHDL, LDLDIRECT in the last 72 hours. Thyroid Function Tests: No results for input(s): TSH, T4TOTAL, FREET4, T3FREE, THYROIDAB in the last 72 hours. Anemia Panel: No results for input(s): VITAMINB12, FOLATE, FERRITIN, TIBC, IRON, RETICCTPCT in the last 72 hours. Urine analysis:    Component Value Date/Time   COLORURINE YELLOW (A) 07/19/2020 1956   APPEARANCEUR CLEAR (A) 07/19/2020 1956   APPEARANCEUR Clear 11/24/2014 1359   LABSPEC 1.008 07/19/2020 1956   LABSPEC 1.008 11/24/2014 1359   PHURINE 7.0 07/19/2020 1956   GLUCOSEU NEGATIVE 07/19/2020 1956   GLUCOSEU Negative 11/24/2014 1359   HGBUR NEGATIVE 07/19/2020 1956   BILIRUBINUR NEGATIVE 07/19/2020 1956   BILIRUBINUR Negative 11/24/2014 [place] 07/19/2020 1956   PROTEINUR NEGATIVE 07/19/2020 1956   NITRITE NEGATIVE 07/19/2020 1956   LEUKOCYTESUR NEGATIVE 07/19/2020 1956   LEUKOCYTESUR Negative 11/24/2014 1359   Sepsis Labs: @LABRCNTIP (procalcitonin:4,lacticidven:4) )No results found for this or any previous visit (from the past 240 hour(s)).   Radiological Exams on Admission: DG Chest 2 View  Result Date: 09/03/2020 CLINICAL DATA:  Shortness of breath.  History of CHF. EXAM: CHEST - 2 VIEW COMPARISON:  07/21/2020.  CT of 07/22/2020 also reviewed. FINDINGS: Normal heart size. Hyperinflation. Right greater than left basilar predominant pulmonary interstitial thickening. Right-sided basilar opacity is to be progressive compared to the 07/21/2020 chest radiograph. No pleural effusion or pneumothorax. IMPRESSION: Interstitial lung disease, consistent with usual interstitial pneumonia (UIP) on prior CT of 07/22/2020. Increased right lung base opacity for which superimposed infection or  aspiration cannot be excluded. Consider radiographic follow-up in 3-5 days. Electronically Signed   By: Abigail Miyamoto M.D.   On: 09/03/2020 18:09    EKG: Independently reviewed.  Sinus rhythm no significant ST changes  Assessment/Plan Principal Problem:   Acute exacerbation of CHF (congestive heart failure) (HCC) Active Problems:   COPD (chronic obstructive pulmonary disease) (HCC)   Diabetes mellitus type 2, controlled (HCC)   CAD (coronary  artery disease)   Hypothyroidism   Hyperlipemia   Edema   Acute respiratory failure with hypoxia (HCC)   ILD (interstitial lung disease) ([place])     #1 acute exacerbation of chronic CHF: Patient will be admitted for increased diuresis.  Placed on oxygen.  Monitor his ins and outs as well as daily weights.  Increase Lasix to 40 mg twice daily IV for now.  Continue to monitor until resolution of symptoms.  #2  COPD: No acute exacerbation.  Continue to monitor  #3 diabetes: Continue with sliding scale insulin.  #4 possible cellulitis: Appears to be chronic stasis edema.  Continue monitoring  #5 interstitial lung disease: Patient at baseline.  Continue oxygen at 4 L/min home regimen.  #6 hypothyroidism: Continue with levothyroxine  #7 hyperlipidemia: Resume on continue home regimen  #8 coronary artery disease: Stable   DVT prophylaxis: Lovenox Code Status: DNR Family Communication: Bedside Disposition Plan: Home Consults called: None Admission status: Inpatient  Severity of Illness: The appropriate patient status for this patient is INPATIENT. Inpatient status is judged to be reasonable and necessary in order to provide the required intensity of service to ensure the patient's safety. The patient's presenting symptoms, physical exam findings, and initial radiographic and laboratory data in the context of their chronic comorbidities is felt to place them at high risk for further clinical deterioration. Furthermore, it is not anticipated that  the patient will be medically stable for discharge from the hospital within 2 midnights of admission. The following factors support the patient status of inpatient.   " The patient's presenting symptoms include lower extremities edema. " The worrisome physical exam findings include chronic stasis dermatitis with lower extremity edema. " The initial radiographic and laboratory data are worrisome because of evidence of interstitial lung disease. " The chronic co-morbidities include CHF interstitial lung disease.   * I certify that at the point of admission it is my clinical judgment that the patient will require inpatient hospital care spanning beyond 2 midnights from the point of admission due to high intensity of service, high risk for further deterioration and high frequency of surveillance required.Barbette Merino MD [facility] Pager [phone]  If 7PM-7AM, please contact night-coverage [email or web address] Password [email or web address]  09/03/2020, 10:50 PM

## 2020-09-03 NOTE — ED Triage Notes (Signed)
Patient to ER from home via [facility]. EMS for c/o swelling/redness to bilateral foot. Patient has h/o CHF. Vitals per EMS: 140/72, 80HR, 146 CBG, 94% on 4L On (normally wears 4L).

## 2020-09-03 NOTE — ED Triage Notes (Signed)
Pt brought in via ems from home with swelling and redness to both lower legs.  Sx for 2 weeks.  Pt is on 4 liters oxygen [place].  Pt has chf.  No chest pain .  Pt alert   Speech clear.

## 2020-09-04 ENCOUNTER — Telehealth: Payer: Self-pay

## 2020-09-04 LAB — CBC WITH DIFFERENTIAL/PLATELET
Abs Immature Granulocytes: 0.03 10*3/uL (ref 0.00–0.07)
Basophils Absolute: 0.1 10*3/uL (ref 0.0–0.1)
Basophils Relative: 1 %
Eosinophils Absolute: 0.1 10*3/uL (ref 0.0–0.5)
Eosinophils Relative: 2 %
HCT: 45.6 % (ref 39.0–52.0)
Hemoglobin: 15.7 g/dL (ref 13.0–17.0)
Immature Granulocytes: 0 %
Lymphocytes Relative: 8 %
Lymphs Abs: 0.8 10*3/uL (ref 0.7–4.0)
MCH: 35.1 pg — ABNORMAL HIGH (ref 26.0–34.0)
MCHC: 34.4 g/dL (ref 30.0–36.0)
MCV: 102 fL — ABNORMAL HIGH (ref 80.0–100.0)
Monocytes Absolute: 1.1 10*3/uL — ABNORMAL HIGH (ref 0.1–1.0)
Monocytes Relative: 12 %
Neutro Abs: 6.9 10*3/uL (ref 1.7–7.7)
Neutrophils Relative %: 77 %
Platelets: 221 10*3/uL (ref 150–400)
RBC: 4.47 MIL/uL (ref 4.22–5.81)
RDW: 14.7 % (ref 11.5–15.5)
WBC: 8.9 10*3/uL (ref 4.0–10.5)
nRBC: 0 % (ref 0.0–0.2)

## 2020-09-04 LAB — GLUCOSE, CAPILLARY: Glucose-Capillary: 98 mg/dL (ref 70–99)

## 2020-09-04 LAB — BASIC METABOLIC PANEL
Anion gap: 11 (ref 5–15)
BUN: 21 mg/dL (ref 8–23)
CO2: 26 mmol/L (ref 22–32)
Calcium: 9.2 mg/dL (ref 8.9–10.3)
Chloride: 97 mmol/L — ABNORMAL LOW (ref 98–111)
Creatinine, Ser: 1.22 mg/dL (ref 0.61–1.24)
GFR, Estimated: 55 mL/min — ABNORMAL LOW (ref >60)
Glucose, Bld: 145 mg/dL — ABNORMAL HIGH (ref 70–99)
Potassium: 3.5 mmol/L (ref 3.5–5.1)
Sodium: 134 mmol/L — ABNORMAL LOW (ref 135–145)

## 2020-09-04 LAB — CBG MONITORING, ED
Glucose-Capillary: 136 mg/dL — ABNORMAL HIGH (ref 70–99)
Glucose-Capillary: 125 mg/dL — ABNORMAL HIGH (ref 70–99)

## 2020-09-04 MED ORDER — GUAIFENESIN ER 600 MG PO TB12
600.0000 mg | ORAL_TABLET | Freq: Two times a day (BID) | ORAL | Status: DC
  Administered 2020-09-04 – 2020-09-09 (×10): 600 mg via ORAL
  Filled 2020-09-04 (×10): qty 1

## 2020-09-04 MED ORDER — GABAPENTIN 100 MG PO CAPS
200.0000 mg | ORAL_CAPSULE | Freq: Three times a day (TID) | ORAL | Status: DC
  Administered 2020-09-04 – 2020-09-09 (×14): 200 mg via ORAL
  Filled 2020-09-04 (×15): qty 2

## 2020-09-04 MED ORDER — SIMVASTATIN 40 MG PO TABS
40.0000 mg | ORAL_TABLET | Freq: Every day | ORAL | Status: DC
  Administered 2020-09-04 – 2020-09-09 (×6): 40 mg via ORAL
  Filled 2020-09-04 (×2): qty 1
  Filled 2020-09-04: qty 4
  Filled 2020-09-04 (×3): qty 1

## 2020-09-04 MED ORDER — ALBUTEROL SULFATE HFA 108 (90 BASE) MCG/ACT IN AERS
2.0000 | INHALATION_SPRAY | Freq: Four times a day (QID) | RESPIRATORY_TRACT | Status: DC | PRN
  Filled 2020-09-04: qty 6.7

## 2020-09-04 MED ORDER — EZETIMIBE-SIMVASTATIN 10-40 MG PO TABS: 1.0000 | ORAL_TABLET | Freq: Every day | ORAL | Status: DC

## 2020-09-04 MED ORDER — HYDROCODONE-ACETAMINOPHEN 5-325 MG PO TABS
1.0000 | ORAL_TABLET | ORAL | Status: DC | PRN
  Administered 2020-09-04 – 2020-09-09 (×11): 1 via ORAL
  Filled 2020-09-04 (×11): qty 1

## 2020-09-04 MED ORDER — EZETIMIBE 10 MG PO TABS
10.0000 mg | ORAL_TABLET | Freq: Every day | ORAL | Status: DC
  Administered 2020-09-04 – 2020-09-09 (×6): 10 mg via ORAL
  Filled 2020-09-04 (×7): qty 1

## 2020-09-04 MED ORDER — CLOPIDOGREL BISULFATE 75 MG PO TABS
75.0000 mg | ORAL_TABLET | Freq: Every day | ORAL | Status: DC
  Administered 2020-09-04 – 2020-09-09 (×6): 75 mg via ORAL
  Filled 2020-09-04 (×6): qty 1

## 2020-09-04 MED ORDER — LEVOTHYROXINE SODIUM 50 MCG PO TABS
50.0000 ug | ORAL_TABLET | Freq: Every day | ORAL | Status: DC
  Administered 2020-09-05 – 2020-09-09 (×5): 50 ug via ORAL
  Filled 2020-09-04 (×5): qty 1

## 2020-09-04 NOTE — Telephone Encounter (Signed)
Per chart review tab pt went to [facility] ED on 09/03/20 and was admitted.

## 2020-09-04 NOTE — Evaluation (Addendum)
Physical Therapy Evaluation Patient Details Name: AHRON HULBERT MRN: [id number] DOB: 03/23/1924 Today's Date: 09/04/2020   History of Present Illness  89yoM here with b/l LE swelling and CHF exacerbation.  He  was admitted 9/10 w/ SOB. PMH: CAD, OA, BPH, CAD, DM, HTN, HLD, hypoTSH, PVD, remote CVA c balance impairment. Pt recently treated for CAP.  Clinical Impression  Pt was very pleasant and eager to work with PT, showed great motivation to try and walk, stating "That will feel great."  However even with getting to EOB he started having exquisite pain in b/l LEs (R>L), we tried to manage some minimal EOB sitting activity and tried to see if he could tolerate better with time; unfortunately he could not tolerate and we could not trial standing.  He will not at all be able to manage at home with his current status; has been to rehab before and acknowledges that he will need this again.    Follow Up Recommendations SNF    Equipment Recommendations  None recommended by PT    Recommendations for Other Services       Precautions / Restrictions Precautions Precautions: Fall Restrictions Weight Bearing Restrictions: No      Mobility  Bed Mobility Overal bed mobility: Needs Assistance Bed Mobility: Supine to Sit;Sit to Supine     Supine to sit: Mod assist Sit to supine: Mod assist   General bed mobility comments: Pt with a lot of LE pain that is exacerbated with transition to sitting and with gravity dependent positioning in sitting.  Calling out in pain t/o the effort and requesting to forgo further activty and lay back down    Transfers                 General transfer comment: deferred attempt at standing secondary to exquisite pain and pt request   Ambulation/Gait                Stairs            Wheelchair Mobility    Modified Rankin (Stroke Patients Only)       Balance Overall balance assessment: Needs assistance Sitting-balance support:  Bilateral upper extremity supported Sitting balance-Leahy Scale: Fair Sitting balance - Comments: Pt pain limited and clearly uncomfortable in sitting, but apart from poor tolerance appeared to maintain balance relatively well for brief effort       Standing balance comment: deferred 2/2 pain, likely would have struggled greatly                             Pertinent Vitals/Pain Pain Assessment: 0-10 Pain Score: 9  Pain Location: b/l LE exercises,     Home Living Family/patient expects to be discharged to:: Private residence Living Arrangements: Spouse/significant other Available Help at Discharge: Family Type of Home: Apartment Home Access: Level entry     Home Layout: One level Home Equipment: Environmental consultant - 4 wheels;Shower seat - built in;Grab bars - tub/shower      Prior Function Level of Independence: Independent with assistive device(s)         Comments: wife does all the driving but he performs all self care at mod I level     Hand Dominance   Dominant Hand: Right    Extremity/Trunk Assessment   Upper Extremity Assessment Upper Extremity Assessment: Generalized weakness    Lower Extremity Assessment Lower Extremity Assessment: Generalized weakness (very pain limited)  Communication   Communication: HOH  Cognition Arousal/Alertness: Awake/alert Behavior During Therapy: WFL for tasks assessed/performed Overall Cognitive Status: Within Functional Limits for tasks assessed       General Comments General comments (skin integrity, edema, etc.): b/l LEs very tender to any palpation/movement, poor tolerance with any activity    Exercises     Assessment/Plan    PT Assessment Patient needs continued PT services  PT Problem List Decreased strength;Decreased range of motion;Decreased activity tolerance;Decreased balance;Decreased mobility;Decreased knowledge of use of DME;Decreased safety awareness;Pain       PT Treatment Interventions DME  instruction;Gait training;Functional mobility training;Therapeutic activities;Therapeutic exercise;Balance training;Neuromuscular re-education;Patient/family education    PT Goals (Current goals can be found in the Care Plan section)  Acute Rehab PT Goals Patient Stated Goal: get LE pain/swelling down enough to try walking PT Goal Formulation: With patient Time For Goal Achievement: 09/18/20 Potential to Achieve Goals: Fair    Frequency Min 2X/week   Barriers to discharge        Co-evaluation               AM-PAC PT "6 Clicks" Mobility  Outcome Measure Help needed turning from your back to your side while in a flat bed without using bedrails?: A Little Help needed moving from lying on your back to sitting on the side of a flat bed without using bedrails?: A Lot Help needed moving to and from a bed to a chair (including a wheelchair)?: A Lot Help needed standing up from a chair using your arms (e.g., wheelchair or bedside chair)?: A Lot Help needed to walk in hospital room?: Total Help needed climbing 3-5 steps with a railing? : Total 6 Click Score: 11    End of Session   Activity Tolerance: Patient limited by pain Patient left: in bed;with call bell/phone within reach Nurse Communication: Mobility status;Patient requests pain meds PT Visit Diagnosis: Muscle weakness (generalized) (M62.81);Difficulty in walking, not elsewhere classified (R26.2);Pain Pain - Right/Left: Right Pain - part of body: Leg    Time: 1700-1749 PT Time Calculation (min) (ACUTE ONLY): 16 min   Charges:   PT Evaluation $PT Eval Low Complexity: 1 Low PT Treatments $Therapeutic Activity: 8-22 mins        Kreg Shropshire, DPT 09/04/2020, 12:11 PM

## 2020-09-04 NOTE — ED Notes (Signed)
Assumed care of pt at 1900, pt medicated per MAR. Denies pain at this time. AO x4. Talking full sentences with regular and unlabored breathing.

## 2020-09-04 NOTE — ED Notes (Signed)
Sat patient up on side of bed to eat. Call bell placed to call for assistance.

## 2020-09-04 NOTE — Progress Notes (Addendum)
   Heart Failure Nurse Navigator Note  HFpEF- 60-65 %, mild LVH, Grade I diastolic dysfunction.  Normal right ventricular systolic function.  Mild left atrial enlargement. Mild TR.   He presented by way of EMS with complaints of worsening  Sob, orthopnea and weeping lower extremity edema.  Co morbidities:  Diabetes Hyperlipidemia Hypertension Osteoarthritis COPD   Medications:  Wears 4 liters 02 at home Atenolol 50 mg daily Vytorin 10/40 daily Lasix 20 mg daily -if wt. > 147 to take extra lasix. Lasix 40 mg IV BID-while hospitalized.  Labs:  Sodium 134,potassium 3.5,BUN 21, creatinine 1.22. on admission BNP 1496,troponin 17.  CXR-Interstitial lung disease, interstitial pneumonia  Weight 150# BMI 25.93   Assessments:  General- he is awake and alert, sitting on gurney in ED, C/o bilateral leg cramps that spread from his calves to his thigh-lasting up to 30 minutes.   HEENT-pupils equal, normocephalic,   No JVD.HOH.  Cardiac- heart tones are regular, no rubs or murmur noted.  Chest- breath sounds coarse to posterior auscultation.  Abdomen- soft non tender.  Musculoskeletal- lower extremity edema 2+, sores non weeping at this time. Bilateral skin on lower legs reddened and warm to touch.  Psych- is very pleasant and appropriate, talkative.  Neuro- speech is clear, moves all extremities without difficulty.   Discussed with patient he states this has been going on for about 2 weeks.  He states some day his legs weeped so much he thought he has urinated on himself.  He states he is feeling somewhat better, but now is having bilateral legs cramps that radiates from his calves to his thighs, showed him how to flex his legs to see if that would help with the discomfort.  Have consulted the wound nurse. Pricilla Riffle RN, CHFN

## 2020-09-04 NOTE — ED Notes (Signed)
C/o 8/10 bilateral leg pain.  Denies chest pain/ shortness of breath, legs are warm to touch, red, weeping, 3+ bilateral edema.  Pain medication given

## 2020-09-04 NOTE — ED Notes (Signed)
Report called to [facility], RN as receiving RN.

## 2020-09-04 NOTE — Evaluation (Signed)
Occupational Therapy Evaluation Patient Details Name: Dylan Knight MRN: [id number] DOB: 1924-09-01 Today's Date: 09/04/2020    History of Present Illness 89yoM here with b/l LE swelling and CHF exacerbation.  He  was admitted 9/10 w/ SOB. PMH: CAD, OA, BPH, CAD, DM, HTN, HLD, hypoTSH, PVD, remote CVA c balance impairment. Pt recently treated for CAP.   Clinical Impression   [name] received for OT evaluation this day. He was pleasant and talkative despite being in considerable pain, reaching 10/10 at times, located in BLE, primarily in feet, ankles, and lower calves. Pt's bed mobility limited 2/2 pain, which was exacerbated when LEs were not elevated. Pt reports being Mod I in ADL at home, using a 4WW/rollator and living in a 1-story apt with no STE. [name] wife is 33 years younger than he; she does the driving, shopping, cooking, and laundry (Mr. Ace has not driven since having a CVA in 2005). Pt reports 4 falls in the previous 12 months. He has dark bruising on dorsal surfaces of hands: he states he tends to bang his hands into doorframes when walking with his 4WW. [name] states that he has been feeling weak since being hospitalized for pneumonia several weeks ago, and comments that he did not leave his apartment between that hospitalization and this trip to the ED, 2/2 lack of energy. He reports that he does not believe he will be able to manage at home until he participates in additional rehabilitation, and he requests placement at [facility] -- he reports his wife spent a few weeks recently and received good therapy services. [name] will benefit from OT services while hospitalized, with DC to SNF recommended.    Follow Up Recommendations  SNF    Equipment Recommendations  None recommended by OT    Recommendations for Other Services       Precautions / Restrictions Precautions Precautions: Fall Restrictions Weight Bearing Restrictions: No       Mobility Bed Mobility Overal bed mobility: Needs Assistance Bed Mobility: Supine to Sit;Sit to Supine     Supine to sit: Mod assist Sit to supine: Mod assist   General bed mobility comments: Pt requires assistance with bed mobility 2/2 pain in LE    Transfers                 General transfer comment: Did not attempt standing    Balance Overall balance assessment: Needs assistance Sitting-balance support: Bilateral upper extremity supported Sitting balance-Leahy Scale: Fair Sitting balance - Comments: Limited by pain       Standing balance comment: Did not attempt standing. Pt reports 4 falls in previous 12 months.                           ADL either performed or assessed with clinical judgement   ADL Overall ADL's : Modified independent;At baseline                                             Vision Patient Visual Report: No change from baseline       Perception     Praxis      Pertinent Vitals/Pain Pain Assessment: 0-10 Pain Score: 10-Worst pain ever Pain Location: b/l LE, in feet primarily this AM. States that during the previous evening/night, pain was primarily in upper thighs  Hand Dominance Right   Extremity/Trunk Assessment Upper Extremity Assessment: Generalized weakness   Lower Extremity Assessment: Generalized weakness       Communication  Communication: HOH   Cognition Arousal/Alertness: Awake/alert Behavior During Therapy: WFL for tasks assessed/performed Overall Cognitive Status: Within Functional Limits for tasks assessed                                   General Comments  Significant edema in BLE. Dark bruising on dorsal surface of hands, bilaterally    Exercises Other Exercises Other Exercises: Educ re: role of OT, POC, DC.   Shoulder Instructions      Home Living Family/patient expects to be discharged to:: Private residence Living Arrangements: Spouse/significant  other Available Help at Discharge: Family Type of Home: Apartment Home Access: Level entry     Home Layout: One level     Bathroom Shower/Tub: Occupational psychologist: Handicapped height Bathroom Accessibility: Yes How Accessible: Accessible via walker Home Equipment: [place] - 4 wheels;Shower seat - built in;Grab bars - tub/shower;Grab bars - toilet          Prior Functioning/Environment Level of Independence: Independent with assistive device(s)        Comments: wife does driving, cooking, shopping, laundry. Hired help for cleaning        OT Problem List: Decreased strength;Decreased activity tolerance;Impaired balance (sitting and/or standing);Decreased coordination;Decreased knowledge of use of DME or AE;Pain;Increased edema      OT Treatment/Interventions: Self-care/ADL training;DME and/or AE instruction;Therapeutic activities;Balance training;Therapeutic exercise;Energy conservation;Patient/family education    OT Goals(Current goals can be found in the care plan section) Acute Rehab OT Goals Patient Stated Goal: feel better OT Goal Formulation: With patient Time For Goal Achievement: 09/18/20 Potential to Achieve Goals: Good  OT Frequency: Min 2X/week   Barriers to D/C: Decreased caregiver support          Co-evaluation              AM-PAC OT "6 Clicks" Daily Activity     Outcome Measure Help from another person eating meals?: None Help from another person taking care of personal grooming?: None Help from another person toileting, which includes using toliet, bedpan, or urinal?: A Lot Help from another person bathing (including washing, rinsing, drying)?: A Lot Help from another person to put on and taking off regular upper body clothing?: None Help from another person to put on and taking off regular lower body clothing?: A Lot 6 Click Score: 18   End of Session Nurse Communication: Mobility status (pain)  Activity Tolerance: Patient  limited by fatigue;Patient limited by pain Patient left: in bed;with call bell/phone within reach  OT Visit Diagnosis: Unsteadiness on feet (R26.81);Muscle weakness (generalized) (M62.81);History of falling (Z91.81);Repeated falls (R29.6);Pain Pain - part of body: Leg                Time: 4782-9562 OT Time Calculation (min): 35 min Charges:  OT General Charges $OT Visit: 1 Visit OT Evaluation $OT Eval Low Complexity: 1 Low OT Treatments $Self Care/Home Management : 23-37 mins  Josiah Lobo, PhD, MS, OTR/L ascom [phone] 09/04/20, 1:33 PM

## 2020-09-04 NOTE — Telephone Encounter (Signed)
Noted.  Will await inpatient notes.  Thanks.

## 2020-09-04 NOTE — TOC Initial Note (Signed)
Transition of Care [facility]) - Initial/Assessment Note    Patient Details  Name: Dylan Knight MRN: [id number] Date of Birth: 10/08/1924  [facility]) CM/SW Contact:    Victorino Dike, RN Phone Number: 09/04/2020, 9:30 AM  Clinical Narrative:                  Met with patient in room, he is alert and oriented to place, person and situation.  He currently lives at home with wife in an apartment on the bottom floor.   He has not driven since 4742, although his wife currently drives.  Wife does the grocery shopping and errands.   Both he and his wife are on oxygen, they have a black bag with a portable oxygen tank they use if out in public, although he states he rarely leaves apartment anymore.    The have a granddaughter that comes once a week to clean apartment and do laundry, although she has difficulties.  If recommended by PT, patient is open to going to SNF at [facility] for short term rehab if necessary.   Expected Discharge Plan: [place] Barriers to Discharge: Continued Medical Work up   Patient Goals and CMS Choice Patient states their goals for this hospitalization and ongoing recovery are:: I would go to snf for rehab at [facility] if that is recommended. CMS Medicare.gov Compare Post Acute Care list provided to:: Patient Choice offered to / list presented to : Patient  Expected Discharge Plan and Services Expected Discharge Plan: [place]   Discharge Planning Services: CM Consult Post Acute Care Choice: [place] Living arrangements for the past 2 months: Apartment                                      Prior Living Arrangements/Services Living arrangements for the past 2 months: Apartment Lives with:: Self, Spouse Patient language and need for interpreter reviewed:: Yes Do you feel safe going back to the place where you live?: Yes      Need for Family Participation in Patient Care:  Yes (Comment) Care giver support system in place?: Yes (comment) Current home services: DME (walker and oxygen) Criminal Activity/Legal Involvement Pertinent to Current Situation/Hospitalization: No - Comment as needed  Activities of Daily Living      Permission Sought/Granted                  Emotional Assessment Appearance:: Appears stated age, Well-Groomed Attitude/Demeanor/Rapport: Engaged, Gracious Affect (typically observed): Accepting, Pleasant, Appropriate Orientation: : Oriented to Self, Oriented to Place, Oriented to Situation Alcohol / Substance Use: Not Applicable Psych Involvement: No (comment)  Admission diagnosis:  Acute exacerbation of CHF (congestive heart failure) ([place]) [I50.9] Patient Active Problem List   Diagnosis Date Noted  . Acute exacerbation of CHF (congestive heart failure) ([place]) 09/03/2020  . ILD (interstitial lung disease) ([place]) 07/23/2020  . Acute respiratory failure with hypoxia ([place])   . Dyspnea 07/19/2020  . CHF exacerbation ([place]) 07/19/2020  . Edema 05/26/2020  . ETD (eustachian tube dysfunction) 04/24/2020  . Insomnia 10/15/2019  . Medicare annual wellness visit, subsequent 07/17/2019  . Tinea cruris 02/12/2019  . Urinary retention 02/10/2019  . Dysuria 02/07/2019  . Restlessness 01/23/2019  . At risk for falls 07/21/2018  . Laceration of left hand 11/13/2017  . Gait abnormality 05/19/2017  . Hyperlipemia 03/29/2017  . HTN (hypertension) 03/29/2017  .  Advance care planning 02/25/2017  . Macular degeneration 02/25/2017  . Transient speech disturbance   . Bilateral carotid artery stenosis   . History of stroke   . TIA (transient ischemic attack) 01/30/2017  . Acute ischemic stroke ([place]) 01/30/2017  . Low back pain with right-sided sciatica 12/01/2016  . Diabetes mellitus type 2, controlled ([place]) 12/01/2016  . CAD (coronary artery disease) 12/01/2016  . Hypothyroidism 12/01/2016  . Back pain 11/02/2016  . Aftercare following  surgery of the circulatory system, [place] 06/28/2013  . Cough 04/18/2013  . COPD (chronic obstructive pulmonary disease) ([place]) 11/22/2012  . Occlusion and stenosis of carotid artery without mention of cerebral infarction 06/29/2012  . Peripheral vascular disease ([place]) 12/30/2011   PCP:  Tonia Ghent, MD Pharmacy:   [id number]: [phone]: [phone]    Social Determinants of Health (SDOH) Interventions    Readmission Risk Interventions Readmission Risk Prevention Plan 09/04/2020  Transportation Screening Complete  PCP or Specialist Appt within 5-7 Days Complete  Home Care Screening Complete  Medication Review (RN CM) Referral to Pharmacy  Some recent data might be hidden

## 2020-09-04 NOTE — ED Notes (Signed)
Patient reports improvement of pain from 8/10 to 4/10 after receiving Norco.

## 2020-09-04 NOTE — Progress Notes (Signed)
PROGRESS NOTE  BRETTON TANDY [id number]:[id number] DOB: December 26, 1923 DOA: 09/03/2020 PCP: Tonia Ghent, MD  Brief History   Dylan Knight is a 86 y.o. male with medical history significant of known systolic CHF, diabetes, hyperlipidemia, hypertension, hypothyroidism, peripheral vascular disease, pulmonary fibrosis BPH, osteoarthritis, hyperlipidemia and COPD who has been having significant weight gain and fluid retention over the last couple of weeks.  Patient has had increased dose of his home Lasix but continues to be significantly fluid overloaded.  He has had anasarca.  Currently over 10 pounds above his usual weight.  Came to the ER with progressive shortness of breath and worsening lower extremity edema..  Denied any sick contact.  No fever or chills no significant cough.  Patient seen and evaluated and appears to have anasarca consistent with worsening CHF.  He is being admitted to the hospital for further evaluation and treatment.  Patient has been communicated with his primary care physician was outpatient treatment and adjustment of medications attempted but has not been successful so he has failed outpatient treatment..  ED Course: Temperature 98.4 blood pressure 143/80 pulse 83 respirate 20 oxygen sat 90% 2 L.  CBC sent chemistry mostly within normal except creatinine 1.43 and glucose 111.  Chest x-ray showed interstitial lung disease consistent with known interstitial pneumonia.  COVID-19 and viral screen currently pending. Admitted for further treatment.  Consultants  . None  Procedures  . None  Antibiotics   Anti-infectives (From admission, onward)   None    .  Subjective  The patient is resting comfortably. No new complaints.   Objective   Vitals:  Vitals:   09/04/20 1800 09/04/20 1815  BP:    Pulse: 74 94  Resp: (!) 21 19  Temp:    SpO2: 100% 93%   Exam:  Constitutional:  . The patient is awake, alert, and oriented x 3. He has mild distress from pain in his  legs bilaterally. He says that they feel tight. Respiratory:  . No increased work of breathing. . No wheezes, rhonchi . Positive for rales at bases. . No tactile fremitus Cardiovascular:  . Regular rate and rhythm . No murmurs, ectopy, or gallups. . No lateral PMI. No thrills. Abdomen:  . Abdomen is soft, non-tender, non-distended . No hernias, masses, or organomegaly . Normoactive bowel sounds.  Musculoskeletal:  . No cyanosis, clubbing, or edema Skin:  . No rashes, lesions, ulcers . palpation of skin: no induration or nodules Neurologic:  . CN 2-12 intact . Sensation all 4 extremities intact Psychiatric:  . Mental status o Mood, affect appropriate o Orientation to person, place, time  . judgment and insight appear intact  I have personally reviewed the following:   Today's Data  . Vitals, BMP  Imaging  . CXR  Scheduled Meds: . enoxaparin (LOVENOX) injection  30 mg Subcutaneous Q2200  . furosemide  40 mg Intravenous BID  . insulin aspart  0-5 Units Subcutaneous QHS  . insulin aspart  0-9 Units Subcutaneous TID WC  . sodium chloride flush  3 mL Intravenous Q12H   Continuous Infusions: . sodium chloride      Principal Problem:   Acute exacerbation of CHF (congestive heart failure) (HCC) Active Problems:   COPD (chronic obstructive pulmonary disease) (HCC)   Diabetes mellitus type 2, controlled (HCC)   CAD (coronary artery disease)   Hypothyroidism   Hyperlipemia   Edema   Acute respiratory failure with hypoxia (HCC)   ILD (interstitial lung disease) ([place])   LOS:  1 day   A & P  Acute exacerbation of chronic CHF: Patient will be admitted for increased diuresis.  Placed on oxygen.  Monitor his ins and outs as well as daily weights.  Increase Lasix to 40 mg twice daily IV for now.  Continue to monitor until resolution of symptoms. Continue lasix and monitor volume status. Echocardiogram ordered due to acute exacerbation and hyotension. Diuresis is limited by  the patient's hypotension.   COPD: No acute exacerbation.  Continue beta agonists.   DM II: Monitor glucoses with sliding scale insulin.  Peripheral neuropathy: Patient is complaining of pain in lower extremities bilaterally. He complains of the sensation of his skin stretching, but I also wonder about neuropathy. Will start low dose gabapentin.  Chronic venous stasis dermatitis:  Continue monitoring and diuresis.  Interstitial lung disease: Patient at baseline.  Continue oxygen at 4 L/min home regimen. Made worse by pulmonary edema due to CHF exacerbation.  Hypothyroidism: Continue with levothyroxine  Hyperlipidemia: Resume on continue home regimen  Coronary artery disease: Stable. Continue Plavix. Pt unable to tolerate continuation of the beta blocker due to hypotension.  I have seen and examined this patient myself. I have spent 35 minutes in his evaluation and care.  DVT prophylaxis: Lovenox Code Status: DNR Family Communication: None available. Disposition Plan: Home Admission status: Inpatient  Severity of Illness: The appropriate patient status for this patient is INPATIENT. Inpatient status is judged to be reasonable and necessary in order to provide the required intensity of service to ensure the patient's safety. The patient's presenting symptoms, physical exam findings, and initial radiographic and laboratory data in the context of their chronic comorbidities is felt to place them at high risk for further clinical deterioration. Furthermore, it is not anticipated that the patient will be medically stable for discharge from the hospital within 2 midnights of admission. The following factors support the patient status of inpatient.   "           The patient's presenting symptoms include lower extremities edema. "           The worrisome physical exam findings include chronic stasis dermatitis with lower extremity edema. "           The initial radiographic and  laboratory data are worrisome because of evidence of interstitial lung disease. "           The chronic co-morbidities include CHF interstitial lung disease.   Keishawna Carranza, DO [facility] Direct contact: see [email or web address]  7PM-7AM contact night coverage as above 09/04/2020, 6:25 PM  LOS: 1 day

## 2020-09-04 NOTE — Telephone Encounter (Signed)
[place] [facility] - Client TELEPHONE ADVICE RECORD AccessNurse Patient Name: Dylan Knight Gender: Male DOB: Sep 20, 1924 Age: 86 Y 10 M 13 D Return Phone Number: [phone] (Primary), [phone] (Secondary) Address[place] [address] Client Dylan Knight - Client Client Site Dylan Knight Physician Renford Dills - MD Contact Type Call Who Is Calling Patient / Member / Family / Caregiver Call Type Triage / Clinical Caller Name Dylan Knight Relationship To Patient Spouse Return Phone Number [phone] (Secondary) Chief Complaint Leg Swelling And Edema Reason for Call Symptomatic / Request for Dylan Knight says that her husband's legs are swelling, weeping and it looks like they are infected. Says he has congestive heart failure. Translation No Nurse Assessment Nurse: Lucky Cowboy, RN, Levada Dy Date/Time (Eastern Time): 09/03/2020 3:38:09 PM Confirm and document reason for call. If symptomatic, describe symptoms. ---Caller stated that husband's legs are reddened, swollen, weeping, and look to be infected. He has CHF. He's had s/s "entire time". She stated that she called the cardiologist and they hadn't answered her call. His baseline weight is 147# and now is 151#. Does the patient have any new or worsening symptoms? ---Yes Will a triage be completed? ---Yes Related visit to physician within the last 2 weeks? ---No Does the PT have any chronic conditions? (i.e. diabetes, asthma, this includes High risk factors for pregnancy, etc.) ---Yes List chronic conditions. ---CHF, hx CVA ("see his file"), and he's 86 years old! Is this a behavioral health or substance abuse call? ---No Guidelines Guideline Title Affirmed Question Affirmed Notes Nurse Date/Time Dylan Knight Time) Leg Swelling and Edema SEVERE leg swelling (e.g., swelling extends above knee, entire leg  is swollen, weeping fluid) Dew, RN, Levada Dy 09/03/2020 3:41:09 PM Disp. Time Dylan Knight Time) Disposition Final User 09/03/2020 3:42:39 PM See HCP within 4 Hours (or PCP triage) Yes Dew, RN, Levada Dy PLEASE NOTE: All timestamps contained within this report are represented as [place] Standard Time. CONFIDENTIALTY NOTICE: This fax transmission is intended only for the addressee. It contains information that is legally privileged, confidential or otherwise protected from use or disclosure. If you are not the intended recipient, you are strictly prohibited from reviewing, disclosing, copying using or disseminating any of this information or taking any action in reliance on or regarding this information. If you have received this fax in error, please notify us immediately by telephone so that we can arrange for its return to [place]. Phone: [phone], Toll-Free: [phone], Fax: [phone] Page: 2 of 2 Call Id: [id number] [place] Disagree/Comply Comply Caller Understands Yes PreDisposition Go to ED Care Advice Given Per Guideline SEE HCP (OR PCP TRIAGE) WITHIN 4 HOURS: * IF OFFICE WILL BE OPEN: You need to be seen within the next 3 or 4 hours. Call your doctor (or NP/PA) now or as soon as the office opens. CALL BACK IF: * You become worse CARE ADVICE given per Leg Swelling and Edema (Adult) guideline. Comments User: Dylan Pitcher, RN Date/Time Dylan Knight Time): 09/03/2020 3:47:02 PM Caller stated that she's going to take him to [facility] ER when I told her that Mearl Latin said for her to take him to an [place]. Referrals REFERRED TO PCP OFFICE Warm transfer to backline

## 2020-09-04 NOTE — ED Notes (Signed)
Patient continues to c/o increasing pain.  No fever noted.  Norco given for pain increase.

## 2020-09-05 ENCOUNTER — Inpatient Hospital Stay: Payer: Medicare PPO

## 2020-09-05 ENCOUNTER — Inpatient Hospital Stay (HOSPITAL_COMMUNITY)
Admit: 2020-09-05 | Discharge: 2020-09-05 | Disposition: A | Discharge status: Home / Self Care | Payer: Medicare PPO | Attending: Internal Medicine | Admitting: Internal Medicine

## 2020-09-05 ENCOUNTER — Encounter: Payer: Self-pay | Admitting: Internal Medicine

## 2020-09-05 DIAGNOSIS — J432 Centrilobular emphysema: Secondary | ICD-10-CM | POA: Diagnosis not present

## 2020-09-05 DIAGNOSIS — N182 Chronic kidney disease, stage 2 (mild): Secondary | ICD-10-CM

## 2020-09-05 DIAGNOSIS — I50810 Right heart failure, unspecified: Secondary | ICD-10-CM

## 2020-09-05 DIAGNOSIS — I272 Pulmonary hypertension, unspecified: Secondary | ICD-10-CM

## 2020-09-05 DIAGNOSIS — I50811 Acute right heart failure: Secondary | ICD-10-CM

## 2020-09-05 DIAGNOSIS — R609 Edema, unspecified: Secondary | ICD-10-CM

## 2020-09-05 DIAGNOSIS — E785 Hyperlipidemia, unspecified: Secondary | ICD-10-CM

## 2020-09-05 DIAGNOSIS — I5021 Acute systolic (congestive) heart failure: Secondary | ICD-10-CM | POA: Diagnosis not present

## 2020-09-05 DIAGNOSIS — J849 Interstitial pulmonary disease, unspecified: Secondary | ICD-10-CM

## 2020-09-05 DIAGNOSIS — E1159 Type 2 diabetes mellitus with other circulatory complications: Secondary | ICD-10-CM | POA: Diagnosis not present

## 2020-09-05 DIAGNOSIS — I10 Essential (primary) hypertension: Secondary | ICD-10-CM

## 2020-09-05 LAB — ECHOCARDIOGRAM COMPLETE
Height: 66 in
S' Lateral: 1.8 cm
Weight: 2300.8 oz

## 2020-09-05 LAB — BASIC METABOLIC PANEL
Anion gap: 11 (ref 5–15)
BUN: 26 mg/dL — ABNORMAL HIGH (ref 8–23)
CO2: 27 mmol/L (ref 22–32)
Calcium: 9 mg/dL (ref 8.9–10.3)
Chloride: 95 mmol/L — ABNORMAL LOW (ref 98–111)
Creatinine, Ser: 1.25 mg/dL — ABNORMAL HIGH (ref 0.61–1.24)
GFR, Estimated: 53 mL/min — ABNORMAL LOW (ref >60)
Glucose, Bld: 111 mg/dL — ABNORMAL HIGH (ref 70–99)
Potassium: 3.5 mmol/L (ref 3.5–5.1)
Sodium: 133 mmol/L — ABNORMAL LOW (ref 135–145)

## 2020-09-05 LAB — SARS CORONAVIRUS 2 BY RT PCR (HOSPITAL ORDER, PERFORMED IN ~~LOC~~ HOSPITAL LAB): SARS Coronavirus 2: NEGATIVE

## 2020-09-05 LAB — GLUCOSE, CAPILLARY
Glucose-Capillary: 107 mg/dL — ABNORMAL HIGH (ref 70–99)
Glucose-Capillary: 166 mg/dL — ABNORMAL HIGH (ref 70–99)
Glucose-Capillary: 122 mg/dL — ABNORMAL HIGH (ref 70–99)
Glucose-Capillary: 227 mg/dL — ABNORMAL HIGH (ref 70–99)

## 2020-09-05 MED ORDER — DIPHENHYDRAMINE HCL 50 MG/ML IJ SOLN: 50.0000 mg | Freq: Once | INTRAMUSCULAR | Status: AC

## 2020-09-05 MED ORDER — IOHEXOL 350 MG/ML SOLN
75.0000 mL | Freq: Once | INTRAVENOUS | Status: AC | PRN
  Administered 2020-09-05: 75 mL via INTRAVENOUS

## 2020-09-05 MED ORDER — HYDROCORTISONE NA SUCCINATE PF 250 MG IJ SOLR
200.0000 mg | Freq: Once | INTRAMUSCULAR | Status: AC
  Administered 2020-09-05: 200 mg via INTRAVENOUS
  Filled 2020-09-05: qty 200

## 2020-09-05 MED ORDER — ENOXAPARIN SODIUM 40 MG/0.4ML ~~LOC~~ SOLN
40.0000 mg | Freq: Every day | SUBCUTANEOUS | Status: DC
  Administered 2020-09-05 – 2020-09-08 (×4): 40 mg via SUBCUTANEOUS
  Filled 2020-09-05 (×4): qty 0.4

## 2020-09-05 MED ORDER — DIPHENHYDRAMINE HCL 25 MG PO CAPS
50.0000 mg | ORAL_CAPSULE | Freq: Once | ORAL | Status: AC
  Administered 2020-09-05: 50 mg via ORAL
  Filled 2020-09-05: qty 2

## 2020-09-05 NOTE — Consult Note (Signed)
Cardiology Consultation:   Patient ID: Dylan Knight; [id number]; December 04, 1923   Admit date: 09/03/2020 Date of Consult: 09/05/2020  Primary Care Provider: Tonia Ghent, MD Primary Cardiologist: New to [facility] - consult by [facility] Primary Electrophysiologist:  None   Patient Profile:   Dylan Knight is a 86 y.o. male with a hx of chronic hypoxic respiratory failure on supplemental oxygen via nasal cannula at 4 L secondary to ILD, pulmonary hypertension, and COPD, diastolic dysfunction, CKD stage II, diabetes, HTN, HLD, hypothyroidism, PVD status post right femoropopliteal bypass in 08/2001 and aortobifemoral bypass in 2003, carotid artery disease status post LICA stenting, BPH, and OA who is being seen today for the evaluation of pulmonary hypertension at the request of Dr. Benny Lennert.  History of Present Illness:   Dylan Knight was evaluated at an outside urgent care in late 06/2020 with hemoptysis felt to be related to bronchial pneumonia.  He was treated with Augmentin and azithromycin.  Following this, he was admitted to the hospital in 07/2020 with acute on chronic hypoxic respiratory failure.  BNP was 543.  Echo, as read by outside cardiology group, showed an EF of 60 to 65%, no regional wall motion abnormalities, grade 1 diastolic dysfunction, normal RV systolic function and ventricular cavity size, mildly dilated left atrium, trivial mitral regurgitation, mild to moderate tricuspid regurgitation, mild aortic valve sclerosis without evidence of stenosis.  Upon review of echo images between Dr. Rockey Situ myself echo appeared to show an enlarged RV cavity.  CT of the chest was consistent with early signs of pulmonary fibrosis.  He was treated with IV Lasix and advised to follow-up with pulmonology as an outpatient.  Discharge weight documented at 67.3 kg.  Following his discharge she was followed by the [facility] CHF clinic with continued escalation of BNP noted, peaking at 1724 on 08/27/2020.   He was treated with IV Lasix earlier this month.  Despite IV Lasix through same-day surgery and escalation of oral diuretic at home he has continued to note progressive shortness of breath, weight gain, abdominal distention, and lower extremity swelling.  Since his hospital discharge in 07/2020 he has gained approximately 10 pounds.  Due to progressively worsening symptoms, he returned to the hospital on 10/26.  High-sensitivity troponin negative x2.  BNP 1496.  Covid negative.  Echo this admission showed an EF of 60 to 65%, no regional wall motion abnormalities, septal flattening consistent with RV pressure and volume overload, moderately reduced RV systolic function with severely enlarged RV cavity size, severely elevated PASP estimated at 82.6 mmHg, severely dilated right atrium, mild mitral regurgitation, and moderate to severe tricuspid regurgitation.  He has been receiving IV Lasix 40 mg twice daily with escalation being limited secondary to underlying renal dysfunction and soft BP.  Over the past 24 hours his weight trend has been from 72.6 kg to 65.2 kg.  Documented urine output of 1.1 L for the past 24 hours with a net -1 L for the admission.   Past Medical History:  Diagnosis Date  . Arthritis    left hip  . BPH (benign prostatic hyperplasia)   . Cancer [facility])    Skin cancers removed  . Carotid artery occlusion   . CHF (congestive heart failure) ([place])   . COPD (chronic obstructive pulmonary disease) ([place])   . Diabetes mellitus without complication ([place])   . Hyperlipidemia   . Hypertension   . Hypothyroidism   . Macular degeneration   . Peripheral arterial disease ([place])  2002  . Pulmonary fibrosis ([place])   . Stroke [facility]) 2005    Past Surgical History:  Procedure Laterality Date  . ABDOMINAL AORTIC ANEURYSM REPAIR  2003   Aorto-left femoral-right iliac BPG by Dr. Scot Dock  . Aortic aneursym surgery     2003- Dr. Scot Dock  . BACK SURGERY     1960  . CAROTID ENDARTERECTOMY  Jan.  25,2007   Right CEA  . Carotid stenosis surgery on right     2007  . CARPAL TUNNEL RELEASE Left 06/02/2016   Procedure: LEFT CARPAL TUNNEL RELEASE;  Surgeon: Daryll Brod, MD;  Location: [place];  Service: Orthopedics;  Laterality: Left;  . EYE SURGERY     Catarct surgery and lens implant - bilateral  . PR VEIN BYPASS GRAFT,AORTO-FEM-POP  2005   Revision Right Fem-pop  . PR VEIN BYPASS GRAFT,AORTO-FEM-POP  2002   Right Fem-pop  . RADIOLOGY WITH ANESTHESIA N/A 12/02/2016   Procedure: MRI LUMBAR SPINE WITHOUT;  Surgeon: Medication Radiologist, MD;  Location: [place];  Service: Radiology;  Laterality: N/A;  . Right leg bypass     2002 -  and amputation of right fifth toe  . SPINE SURGERY  1960   lumbar spine  . [place], 2007   cervical spine  . TOE AMPUTATION  08-2001   right 5th toe amputation     Home Meds: Prior to Admission medications   Medication Sig Start Date End Date Taking? Authorizing Provider  acetaminophen (TYLENOL) 325 MG tablet Take 650 mg by mouth every 6 (six) hours as needed for mild pain.    Yes [provider]  albuterol (VENTOLIN HFA) 108 (90 Base) MCG/ACT inhaler Inhale 2 puffs into the lungs every 6 (six) hours as needed for wheezing or shortness of breath. 07/24/20  Yes Dwyane Dee, MD  atenolol (TENORMIN) 50 MG tablet Take 1 tablet (50 mg total) by mouth daily. 07/25/20  Yes Dwyane Dee, MD  clopidogrel (PLAVIX) 75 MG tablet TAKE 1 TABLET (75MG ) BY MOUTH EVERY DAY 12/08/19  Yes Tonia Ghent, MD  clotrimazole-betamethasone (LOTRISONE) cream APPLY TO AFFECTED AREA TWICE A DAY 06/04/20  Yes Tonia Ghent, MD  ezetimibe-simvastatin (VYTORIN) 10-40 MG tablet TAKE 1 TABLET BY MOUTH EVERY DAY 05/19/20  Yes Tonia Ghent, MD  furosemide (LASIX) 20 MG tablet Take 1 tablet (20 mg total) by mouth daily. Take an extra tab if weight is 147 or above in the AM. 08/01/20  Yes Tonia Ghent, MD  guaiFENesin (MUCINEX) 600 MG 12 hr  tablet Take 600 mg by mouth 2 (two) times daily.    Yes [provider]  levothyroxine (SYNTHROID) 25 MCG tablet Take 2 tablets (50 mcg total) by mouth daily before breakfast. 03/29/20  Yes Tonia Ghent, MD  Multiple Vitamins-Minerals (CENTRUM SILVER PO) Take 1 tablet by mouth daily.   Yes [provider]  sitaGLIPtin-metformin (JANUMET) 50-500 MG tablet Take 1 tablet by mouth daily.   Yes [provider]    Inpatient Medications: Scheduled Meds: . clopidogrel  75 mg Oral Daily  . diphenhydrAMINE  50 mg Oral Once   Or  . diphenhydrAMINE  50 mg Intravenous Once  . enoxaparin (LOVENOX) injection  40 mg Subcutaneous Q2200  . ezetimibe  10 mg Oral Daily   And  . simvastatin  40 mg Oral Daily  . furosemide  40 mg Intravenous BID  . gabapentin  200 mg Oral TID  . guaiFENesin  600 mg  Oral BID  . insulin aspart  0-5 Units Subcutaneous QHS  . insulin aspart  0-9 Units Subcutaneous TID WC  . levothyroxine  50 mcg Oral Q0600  . sodium chloride flush  3 mL Intravenous Q12H   Continuous Infusions: . sodium chloride     PRN Meds: sodium chloride, acetaminophen, albuterol, HYDROcodone-acetaminophen, ondansetron (ZOFRAN) IV, sodium chloride flush  Allergies:   Allergies  Allergen Reactions  . Iohexol Hives and Other (See Comments)    Went away with benadryl   . Iodine Rash    Social History:   Social History   Socioeconomic History  . Marital status: Married    Spouse name: Not on file  . Number of children: Not on file  . Years of education: Not on file  . Highest education level: Not on file  Occupational History  . Not on file  Tobacco Use  . Smoking status: Former Smoker    Packs/day: 1.00    Years: 63.00    Pack years: 63.00    Types: Cigarettes    Quit date: 04/09/2004    Years since quitting: 16.4  . Smokeless tobacco: Former Systems developer  . Tobacco comment: Quit smoking in 2005  Vaping Use  . Vaping Use: Never used  Substance and Sexual  Activity  . Alcohol use: No    Alcohol/week: 0.0 standard drinks  . Drug use: No  . Sexual activity: Not on file  Other Topics Concern  . Not on file  Social History Narrative   Former Dance movement psychotherapist for [employer]   Married 1996   From [place]. February '45 through February '47, honorable discharge. 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 Difficulty of Paying Living Expenses: Not on file  Food Insecurity:   . Worried About Charity fundraiser in the Last Year: Not on file  . Ran Out of Food in the Last Year: Not on file  Transportation Needs:   . Lack of Transportation (Medical): Not on file  . Lack of Transportation (Non-Medical): Not on file  Physical Activity:   . Days of Exercise per Week: Not on file  . Minutes of Exercise per Session: Not on file  Stress:   . Feeling of Stress : Not on file  Social Connections:   . Frequency of Communication with Friends and Family: Not on file  . Frequency of Social Gatherings with Friends and Family: Not on file  . Attends Religious Services: Not on file  . Active Member of Clubs or Organizations: Not on file  . Attends Archivist Meetings: Not on file  . Marital Status: Not on file  Intimate Partner Violence:   . Fear of Current or Ex-Partner: Not on file  . Emotionally Abused: Not on file  . Physically Abused: Not on file  . Sexually Abused: Not on file     Family History:   Family History  Problem Relation Age of Onset  . Stroke Mother   . Hypertension Mother   . Heart disease Father   . Heart attack Father   . Cancer Brother   . Hypertension Brother     ROS:  Review of Systems  Constitutional: Positive for malaise/fatigue. Negative for chills, diaphoresis, fever and weight loss.  HENT: Negative for congestion.   Eyes: Negative for discharge and redness.  Respiratory: Positive for shortness of breath. Negative for cough,  sputum production  and wheezing.   Cardiovascular: Positive for leg swelling. Negative for chest pain, palpitations, orthopnea, claudication and PND.  Gastrointestinal: Negative for abdominal pain, heartburn, nausea and vomiting.  Musculoskeletal: Negative for falls and myalgias.  Skin: Negative for rash.  Neurological: Positive for weakness. Negative for dizziness, tingling, tremors, sensory change, speech change, focal weakness and loss of consciousness.  Endo/Heme/Allergies: Does not bruise/bleed easily.  Psychiatric/Behavioral: Negative for substance abuse. The patient is not nervous/anxious.   All other systems reviewed and are negative.     Physical Exam/Data:   Vitals:   09/05/20 0417 09/05/20 0429 09/05/20 0932 09/05/20 1142  BP: 122/65 140/68 97/70 109/74  Pulse: 81 78 95 94  Resp: 20 20 18 18   Temp: (!) 97.4 F (36.3 C) 97.6 F (36.4 C) (!) 97.5 F (36.4 C) 98 F (36.7 C)  TempSrc: Oral  Oral Oral  SpO2: 96% 96% 96% 97%  Weight:  65.2 kg    Height:        Intake/Output Summary (Last 24 hours) at 09/05/2020 1548 Last data filed at 09/05/2020 0900 Gross per 24 hour  Intake 720 ml  Output 1400 ml  Net -680 ml   Filed Weights   09/04/20 0259 09/04/20 2309 09/05/20 0429  Weight: 72.6 kg 66.3 kg 65.2 kg   Body mass index is 23.21 kg/m.   Physical Exam: General: Well developed, well nourished, in no acute distress. Head: Normocephalic, atraumatic, sclera non-icteric, no xanthomas, nares without discharge.  Neck: Negative for carotid bruits. JVD not elevated. Lungs: Clear bilaterally to auscultation without wheezes, rales, or rhonchi. Breathing is unlabored. Heart: RRR with S1 S2. II/VI systolic murmur LSB, no rubs, or gallops appreciated. Abdomen: Soft, non-tender, non-distended with normoactive bowel sounds. No hepatomegaly. No rebound/guarding. No obvious abdominal masses. Msk:  Strength and tone appear normal for age. Extremities: No clubbing or cyanosis. No  edema.  Bandages noted along the anterior aspect of the bilateral lower extremities.  Distal pedal pulses are 2+ and equal bilaterally. Neuro: Alert and oriented X 3. No facial asymmetry. No focal deficit. Moves all extremities spontaneously. Psych:  Responds to questions appropriately with a normal affect.   EKG:  The EKG was personally reviewed and demonstrates: NSR, 79 bpm, right axis deviation, wandering V4, poor R wave progression along the precordial leads, pulmonary disease pattern noted, no acute ST-T changes Telemetry:  Telemetry was personally reviewed and demonstrates: SR  Weights: Filed Weights   09/04/20 0259 09/04/20 2309 09/05/20 0429  Weight: 72.6 kg 66.3 kg 65.2 kg    Relevant CV Studies:  2D echo 09/05/2020: 1. Left ventricular ejection fraction, by estimation, is 60 to 65%. The  left ventricle has normal function. The left ventricle has no regional  wall motion abnormalities with septal flattening consistent with RV  pressure and volume overload.  2. Right ventricular systolic function is moderately reduced. The right  ventricular size is severely enlarged. There is severely elevated  pulmonary artery systolic pressure. The estimated right ventricular  systolic pressure is 78.2 mmHg.  3. Right atrial size was severely dilated.  4. Mild mitral valve regurgitation.  5. Tricuspid valve regurgitation is moderate to severe. __________  2D echo 07/20/2020 ([place]): 1. Left ventricular ejection fraction, by estimation, is 60 to 65%. The  left ventricle has normal function. The left ventricle has no regional  wall motion abnormalities. There is mild concentric left ventricular  hypertrophy. Left ventricular diastolic  parameters are consistent with Grade I diastolic dysfunction (impaired  relaxation).  2.  Right ventricular systolic function is normal. The right ventricular  size is normal.  3. Left atrial size was mildly dilated.  4. The mitral valve  is normal in structure. Trivial mitral valve  regurgitation. No evidence of mitral stenosis. Severe mitral annular  calcification.  5. Tricuspid valve regurgitation is mild to moderate.  6. The aortic valve is calcified. Aortic valve regurgitation is not  visualized. Mild aortic valve sclerosis is present, with no evidence of  aortic valve stenosis.  7. The inferior vena cava is normal in size with greater than 50%  respiratory variability, suggesting right atrial pressure of 3 mmHg.   Review of echo images with Dr. Rockey Situ shows enlarged RV cavity on echo from 07/20/2020 __________  2D echo 01/2017: - Left ventricle: The cavity size was normal. Wall thickness was  increased in a pattern of moderate LVH. Systolic function was  normal. The estimated ejection fraction was in the range of 60%  to 65%. Doppler parameters are consistent with abnormal left  ventricular relaxation (grade 1 diastolic dysfunction).  - Aortic valve: Valve area (VTI): 3.32 cm^2. Valve area (Vmax):  2.89 cm^2. Valve area (Vmean): 2.46 cm^2.  - Atrial septum: No defect or patent foramen ovale was identified.  - Technically difficult study.   Laboratory Data:  Chemistry Recent Labs  Lab 09/03/20 1708 09/03/20 1708 09/03/20 2225 09/04/20 1300 09/05/20 0526  NA 135  --   --  134* 133*  K 4.1  --   --  3.5 3.5  CL 98  --   --  97* 95*  CO2 27  --   --  26 27  GLUCOSE 111*  --   --  145* 111*  BUN 22  --   --  21 26*  CREATININE 1.50*   < > 1.43* 1.22 1.25*  CALCIUM 9.4  --   --  9.2 9.0  GFRNONAA 43*   < > 45* 55* 53*  ANIONGAP 10  --   --  11 11   < > = values in this interval not displayed.    No results for input(s): PROT, ALBUMIN, AST, ALT, ALKPHOS, BILITOT in the last 168 hours. Hematology Recent Labs  Lab 09/03/20 1708 09/03/20 2225 09/04/20 1150  WBC 8.2 8.3 8.9  RBC 4.18* 4.35 4.47  HGB 14.6 15.3 15.7  HCT 43.2 45.3 45.6  MCV 103.3* 104.1* 102.0*  MCH 34.9* 35.2* 35.1*   MCHC 33.8 33.8 34.4  RDW 14.4 14.2 14.7  PLT 201 200 221   Cardiac EnzymesNo results for input(s): TROPONINI in the last 168 hours. No results for input(s): TROPIPOC in the last 168 hours.  BNP Recent Labs  Lab 09/03/20 1708  BNP 1,496.3*    DDimer No results for input(s): DDIMER in the last 168 hours.  Radiology/Studies:  DG Chest 2 View  Result Date: 09/03/2020 IMPRESSION: Interstitial lung disease, consistent with usual interstitial pneumonia (UIP) on prior CT of 07/22/2020. Increased right lung base opacity for which superimposed infection or aspiration cannot be excluded. Consider radiographic follow-up in 3-5 days. Electronically Signed   By: Abigail Miyamoto M.D.   On: 09/03/2020 18:09   Assessment and Plan:   1.  Acute on chronic hypoxic respiratory failure secondary to ILD, pulmonary hypertension, RV dysfunction, and COPD/lower extremity swelling: -Possibly related to ILD/COPD -Interestingly, his lungs do not have a typical pulmonary fibrosis sounds to auscultation, however imaging indicates this may be early in onset -He was noted to have an enlarged RV cavity  on our review of echo images from 07/20/2020 though updated echo from today does appear to show a slightly larger RV cavity size with an estimated PASP of 82 -CTA chest has been ordered -If CTA chest is unrevealing for acute PE, in outpatient follow-up would recommend VQ scan to evaluate for chronic PE, this is the study of choice for chronic pulmonary embolism -He will need a RHC to further assess his hemodynamics, with timing to be determined based on his clinical progression -Consider lower extremity venous ultrasound, defer to primary service -Consider referral to advanced heart failure service in the outpatient setting  2.  CKD stage II: -Appears to be at approximate baseline -Close monitoring with diuresis and following CTA chest  3.  HTN: -Blood pressure is reasonably controlled -Continue current therapy  4.   HLD: -Remains on simvastatin and Zetia  5.  PVD: -Remains on PTA clopidogrel -Most recent ABIs from 04/2020 improved from prior -Patent LICA stent by noninvasive imaging in 04/2020  6.  Hypothyroidism: -TSH remains elevated -He remains on a levothyroxine supplementation    For questions or updates, please contact [place] Please consult [email or web address] for contact info under Cardiology/STEMI.   Signed, Christell Faith, PA-C [place] Pager: [phone] 09/05/2020, 3:48 PM

## 2020-09-05 NOTE — Progress Notes (Addendum)
PROGRESS NOTE  SCOTLAND KORVER MPN:[id number] DOB: 23-Apr-1924 DOA: 09/03/2020 PCP: Tonia Ghent, MD  Brief History   Dylan Knight is a 86 y.o. male with medical history significant of known systolic CHF, diabetes, hyperlipidemia, hypertension, hypothyroidism, peripheral vascular disease, pulmonary fibrosis BPH, osteoarthritis, hyperlipidemia and COPD who has been having significant weight gain and fluid retention over the last couple of weeks.  Patient has had increased dose of his home Lasix but continues to be significantly fluid overloaded.  He has had anasarca.  Currently over 10 pounds above his usual weight.  Came to the ER with progressive shortness of breath and worsening lower extremity edema..  Denied any sick contact.  No fever or chills no significant cough.  Patient seen and evaluated and appears to have anasarca consistent with worsening CHF.  He is being admitted to the hospital for further evaluation and treatment.  Patient has been communicated with his primary care physician was outpatient treatment and adjustment of medications attempted but has not been successful so he has failed outpatient treatment..  ED Course: Temperature 98.4 blood pressure 143/80 pulse 83 respirate 20 oxygen sat 90% 2 L.  CBC sent chemistry mostly within normal except creatinine 1.43 and glucose 111.  Chest x-ray showed interstitial lung disease consistent with known interstitial pneumonia.  COVID-19 and viral screen currently pending. Admitted for further treatment.  The patient has been admitted to a telemetry bed. Diuresis had to be discontinued last night due to the patient's low blood pressures. Echocardiogram is pending.  The patient has been admitted to a telemetry bed. Wound care was consulted for the weeping in his legs and have recommended compression hose that fit appropriately.  Consultants  . None  Procedures  . None  Antibiotics   Anti-infectives (From admission, onward)   None      Subjective  The patient is resting comfortably. No new complaints. He states that neurontin has resolved the pain in his legs.  Objective   Vitals:  Vitals:   09/05/20 0932 09/05/20 1142  BP: 97/70 109/74  Pulse: 95 94  Resp: 18 18  Temp: (!) 97.5 F (36.4 C) 98 F (36.7 C)  SpO2: 96% 97%   Exam:  Constitutional:  . The patient is awake, alert, and oriented x 3. No acute distress.  Respiratory:  . No increased work of breathing. . No wheezes, rhonchi, or rales. . Positive for rales at bases. . No tactile fremitus Cardiovascular:  . Regular rate and rhythm . No murmurs, ectopy, or gallups. . No lateral PMI. No thrills. Abdomen:  . Abdomen is soft, non-tender, non-distended . No hernias, masses, or organomegaly . Normoactive bowel sounds.  Musculoskeletal:  . No cyanosis, clubbing, or edema Skin:  . No rashes, lesions, ulcers . palpation of skin: no induration or nodules . Mild erythema to bilateral lower extremities, left worse than right.  . Wrinkling present demonstrating success of diuresis. Neurologic:  . CN 2-12 intact . Sensation all 4 extremities intact Psychiatric:  . Mental status o Mood, affect appropriate o Orientation to person, place, time  . judgment and insight appear intact  I have personally reviewed the following:   Today's Data  . Vitals, BMP  Imaging  . CXR . Echocardiogram: Moderately decreased function of right ventricle with RV volume overload. Was normal in September.  Scheduled Meds: . clopidogrel  75 mg Oral Daily  . enoxaparin (LOVENOX) injection  30 mg Subcutaneous Q2200  . ezetimibe  10 mg Oral Daily  And  . simvastatin  40 mg Oral Daily  . furosemide  40 mg Intravenous BID  . gabapentin  200 mg Oral TID  . guaiFENesin  600 mg Oral BID  . insulin aspart  0-5 Units Subcutaneous QHS  . insulin aspart  0-9 Units Subcutaneous TID WC  . levothyroxine  50 mcg Oral Q0600  . sodium chloride flush  3 mL Intravenous Q12H    Continuous Infusions: . sodium chloride      Principal Problem:   Acute exacerbation of CHF (congestive heart failure) (HCC) Active Problems:   COPD (chronic obstructive pulmonary disease) (HCC)   Diabetes mellitus type 2, controlled ([place])   CAD (coronary artery disease)   Hypothyroidism   Hyperlipemia   Edema   Acute respiratory failure with hypoxia (HCC)   ILD (interstitial lung disease) ([place])   LOS: 2 days   A & P  Acute Right systolic failure: Echocardiogram in 07/2020 demonstrated normal RV function and size. Echocardiogram obtained this morning demonstrates a marked reduction in systolic function of the right ventricle. He has been receiving Lasix to 40 mg twice daily IV as allowed by his low blood pressures.I have consulted Dr. Rockey Situ. He has recommended CTA chest to rule out PE. He will see the patient.  COPD: No acute exacerbation.  Continue beta agonists.   DM II: Monitor glucoses with sliding scale insulin.  Peripheral neuropathy: Patient is complaining of pain in lower extremities bilaterally. He complains of the sensation of his skin stretching, but I also wonder about neuropathy. Will start low dose gabapentin.  Chronic venous stasis dermatitis:  Continue monitoring and diuresis. Compression hose should help when he is home.  Interstitial lung disease: Patient at baseline.  Continue oxygen at 4 L/min home regimen. Made worse by pulmonary edema due to CHF exacerbation.  Hypothyroidism: Continue with levothyroxine  Hyperlipidemia: Resume on continue home regimen  Coronary artery disease: Stable. Continue Plavix. Pt unable to tolerate continuation of the beta blocker due to hypotension.  I have seen and examined this patient myself. I have spent 38 minutes in his evaluation and care.  DVT prophylaxis: Lovenox Code Status: DNR Family Communication: None available. Disposition Plan: Home Admission status: Inpatient  Severity of Illness: The  appropriate patient status for this patient is INPATIENT. Inpatient status is judged to be reasonable and necessary in order to provide the required intensity of service to ensure the patient's safety. The patient's presenting symptoms, physical exam findings, and initial radiographic and laboratory data in the context of their chronic comorbidities is felt to place them at high risk for further clinical deterioration. Furthermore, it is not anticipated that the patient will be medically stable for discharge from the hospital within 2 midnights of admission. The following factors support the patient status of inpatient.   "           The patient's presenting symptoms include lower extremities edema. "           The worrisome physical exam findings include chronic stasis dermatitis with lower extremity edema. "           The initial radiographic and laboratory data are worrisome because of evidence of interstitial lung disease. "           The chronic co-morbidities include CHF interstitial lung disease.   Kalinda Romaniello, DO [facility] Direct contact: see [email or web address]  7PM-7AM contact night coverage as above 09/05/2020, 2:20 PM  LOS: 1 day

## 2020-09-05 NOTE — NC FL2 (Signed)
[place] LEVEL OF CARE SCREENING TOOL     IDENTIFICATION  Patient Name: Dylan Knight Birthdate: [birth year] Sex: male Admission Date (Current Location): 09/03/2020  [place] and [place] Number:  Engineering geologist and Address:  [facility], [address], [place], [place] [address]      Provider Number: [id number]  Attending Physician Name and Address:  Karie Kirks, DO  Relative Name and Phone Number:       Current Level of Care: Hospital Recommended Level of Care: [place] Prior Approval Number:    Date Approved/Denied:   PASRR Number: [id number]  Discharge Plan: SNF    Current Diagnoses: Patient Active Problem List   Diagnosis Date Noted  . Acute exacerbation of CHF (congestive heart failure) ([place]) 09/03/2020  . ILD (interstitial lung disease) ([place]) 07/23/2020  . Acute respiratory failure with hypoxia ([place])   . Dyspnea 07/19/2020  . CHF exacerbation ([place]) 07/19/2020  . Edema 05/26/2020  . ETD (eustachian tube dysfunction) 04/24/2020  . Insomnia 10/15/2019  . Medicare annual wellness visit, subsequent 07/17/2019  . Tinea cruris 02/12/2019  . Urinary retention 02/10/2019  . Dysuria 02/07/2019  . Restlessness 01/23/2019  . At risk for falls 07/21/2018  . Laceration of left hand 11/13/2017  . Gait abnormality 05/19/2017  . Hyperlipemia 03/29/2017  . HTN (hypertension) 03/29/2017  . Advance care planning 02/25/2017  . Macular degeneration 02/25/2017  . Transient speech disturbance   . Bilateral carotid artery stenosis   . History of stroke   . TIA (transient ischemic attack) 01/30/2017  . Acute ischemic stroke ([place]) 01/30/2017  . Low back pain with right-sided sciatica 12/01/2016  . Diabetes mellitus type 2, controlled ([place]) 12/01/2016  . CAD (coronary artery disease) 12/01/2016  . Hypothyroidism 12/01/2016  . Back pain 11/02/2016  . Aftercare following surgery of the circulatory system, [place]  06/28/2013  . Cough 04/18/2013  . COPD (chronic obstructive pulmonary disease) ([place]) 11/22/2012  . Occlusion and stenosis of carotid artery without mention of cerebral infarction 06/29/2012  . Peripheral vascular disease ([place]) 12/30/2011    Orientation RESPIRATION BLADDER Height & Weight     Self, Time, Situation, Place  O2 (Nasal Canula 4 L) Incontinent Weight: 143 lb 12.8 oz (65.2 kg) Height:  5\' 6"  (167.6 cm)  BEHAVIORAL SYMPTOMS/MOOD NEUROLOGICAL BOWEL NUTRITION STATUS   (None)  (None) Continent Diet (Heart healthy/carb modified)  AMBULATORY STATUS COMMUNICATION OF NEEDS Skin   Extensive Assist Verbally Normal                       Personal Care Assistance Level of Assistance  Bathing, Feeding, Dressing Bathing Assistance: Independent (Modified) Feeding assistance: Independent (Modified) Dressing Assistance: Independent (Modified)     Functional Limitations Info  Sight, Hearing, Speech Sight Info: Adequate Hearing Info: Adequate Speech Info: Adequate    SPECIAL CARE FACTORS FREQUENCY  PT (By licensed PT), OT (By licensed OT)     PT Frequency: 5 x week OT Frequency: 5 x week            Contractures Contractures Info: Not present    Additional Factors Info  Code Status, Allergies Code Status Info: DNR Allergies Info: Iohexol, Iodine           Current Medications (09/05/2020):  This is the current hospital active medication list Current Facility-Administered Medications  Medication Dose Route Frequency Provider Last Rate Last Admin  . 0.9 %  sodium chloride infusion  250 mL Intravenous PRN  Elwyn Reach, MD      . acetaminophen (TYLENOL) tablet 650 mg  650 mg Oral Q4H PRN Elwyn Reach, MD   650 mg at 09/04/20 1937  . albuterol (VENTOLIN HFA) 108 (90 Base) MCG/ACT inhaler 2 puff  2 puff Inhalation Q6H PRN Swayze, Ava, DO      . clopidogrel (PLAVIX) tablet 75 mg  75 mg Oral Daily Swayze, Ava, DO   75 mg at 09/04/20 1937  . enoxaparin (LOVENOX)  injection 30 mg  30 mg Subcutaneous Q2200 Gala Romney L, MD   30 mg at 09/04/20 2216  . ezetimibe (ZETIA) tablet 10 mg  10 mg Oral Daily Swayze, Ava, DO   10 mg at 09/04/20 1928   And  . simvastatin (ZOCOR) tablet 40 mg  40 mg Oral Daily Swayze, Ava, DO   40 mg at 09/04/20 2016  . furosemide (LASIX) injection 40 mg  40 mg Intravenous BID Swayze, Ava, DO   40 mg at 09/04/20 1824  . gabapentin (NEURONTIN) capsule 200 mg  200 mg Oral TID Swayze, Ava, DO   200 mg at 09/04/20 1928  . guaiFENesin (MUCINEX) 12 hr tablet 600 mg  600 mg Oral BID Swayze, Ava, DO   600 mg at 09/04/20 2215  . HYDROcodone-acetaminophen (NORCO/VICODIN) 5-325 MG per tablet 1 tablet  1 tablet Oral Q4H PRN Elwyn Reach, MD   1 tablet at 09/04/20 2323  . insulin aspart (novoLOG) injection 0-5 Units  0-5 Units Subcutaneous QHS Elwyn Reach, MD   5 Units at 09/04/20 0004  . insulin aspart (novoLOG) injection 0-9 Units  0-9 Units Subcutaneous TID WC Garba, Mohammad L, MD      . levothyroxine (SYNTHROID) tablet 50 mcg  50 mcg Oral Q0600 Swayze, Ava, DO   50 mcg at 09/05/20 0447  . ondansetron (ZOFRAN) injection 4 mg  4 mg Intravenous Q6H PRN Jonelle Sidle, Mohammad L, MD      . sodium chloride flush (NS) 0.9 % injection 3 mL  3 mL Intravenous Q12H Elwyn Reach, MD   3 mL at 09/04/20 2207  . sodium chloride flush (NS) 0.9 % injection 3 mL  3 mL Intravenous PRN Elwyn Reach, MD         Discharge Medications: Please see discharge summary for a list of discharge medications.  Relevant Imaging Results:  Relevant Lab Results:   Additional Information SS#: [id number]  Candie Chroman, LCSW

## 2020-09-05 NOTE — Progress Notes (Signed)
PHARMACIST - PHYSICIAN COMMUNICATION  CONCERNING:  Enoxaparin (Lovenox) for DVT Prophylaxis    RECOMMENDATION: Patient was prescribed enoxaprin 40mg  q24 hours for VTE prophylaxis.   Filed Weights   09/04/20 0259 09/04/20 2309 09/05/20 0429  Weight: 72.6 kg (160 lb 0.9 oz) 66.3 kg (146 lb 1.6 oz) 65.2 kg (143 lb 12.8 oz)    Body mass index is 23.21 kg/m.  Estimated Creatinine Clearance: 32.6 mL/min (A) (by C-G formula based on SCr of 1.25 mg/dL (H)).    Patient is candidate for enoxaparin 40mg  every 24 hours based on CrCl >31ml/min or Weight >45kg  DESCRIPTION: Pharmacy has adjusted enoxaparin dose per [facility] policy.  Patient is now receiving enoxaparin 40 mg every 24 hours    Rowland Lathe, PharmD Clinical Pharmacist  09/05/2020 2:29 PM

## 2020-09-05 NOTE — Progress Notes (Signed)
Physical Therapy Treatment Patient Details Name: Dylan Knight MRN: [id number] DOB: 07-17-1924 Today's Date: 09/05/2020    History of Present Illness 89yoM here with b/l LE swelling and CHF exacerbation.  He  was admitted 9/10 w/ SOB. PMH: CAD, OA, BPH, CAD, DM, HTN, HLD, hypoTSH, PVD, remote CVA c balance impairment. Pt recently treated for CAP.    PT Comments    Pt continues to have b/l LE pain, but much less than it has been and he was able to do multiple sit to stand bouts as well as 2 x 45 ft ambulation bouts.  He was reliant on the walker and after good initial effort (with some inconsistent unsteadiness) he started to fatigue quickly each time and though vitals remained relatively stable he clearly lacked the endurance to push himself any further with SOB and requests to sit rather abruptly.    Follow Up Recommendations  SNF     Equipment Recommendations  None recommended by PT    Recommendations for Other Services       Precautions / Restrictions Precautions Precautions: Fall Restrictions Weight Bearing Restrictions: Yes    Mobility  Bed Mobility               General bed mobility comments: in recliner on arrival, reported that nursing did need to assist with LEs to get up to sitting  Transfers Overall transfer level: Needs assistance Equipment used: Rolling walker (2 wheeled) Transfers: Sit to/from Stand Sit to Stand: Min assist         General transfer comment: Pt showed good effort, needed multiple attempts each time.  Once he was able to shift weight forward enough to get to standing w/o direct assist, both other times required some assist to keep from losing balance backward  Ambulation/Gait Ambulation/Gait assistance: Min assist;Min guard Gait Distance (Feet): 45 Feet Assistive device: Rolling walker (2 wheeled)       General Gait Details: 45 ft X 2 with seated rest break.  Pt on 4L O2 (which is baseline) t/o the effort with significant  fatigue with increased distance.  He did not have any overt LOBs but had inconsistent, at times, shuffling steps with heavy walker reliant.  Pt did well but did fatigue relatively quickly with shortness of breath and general fatigue.     Stairs             Wheelchair Mobility    Modified Rankin (Stroke Patients Only)       Balance Overall balance assessment: Needs assistance Sitting-balance support: Bilateral upper extremity supported Sitting balance-Leahy Scale: Good     Standing balance support: Bilateral upper extremity supported Standing balance-Leahy Scale: Fair Standing balance comment: Reliant on the walker,/UEs able to maintain standing balance with minimal inconsistent unsteadiness                             Cognition Arousal/Alertness: Awake/alert Behavior During Therapy: WFL for tasks assessed/performed Overall Cognitive Status: Within Functional Limits for tasks assessed                                 General Comments: Pt able to interact appropriately, pleasant and eager to work      Exercises      General Comments General comments (skin integrity, edema, etc.): Pt with great effort but with both bouts of ambulation started to fatigue quickly at ~30-35  ft make and did not have the stamina to actually get to 50 ft.        Pertinent Vitals/Pain Pain Assessment: 0-10 Pain Score: 4  Pain Location: b/l LEs, especially in feet     Home Living                      Prior Function            PT Goals (current goals can now be found in the care plan section) Progress towards PT goals: Progressing toward goals    Frequency    Min 2X/week      PT Plan Current plan remains appropriate    Co-evaluation              AM-PAC PT "6 Clicks" Mobility   Outcome Measure  Help needed turning from your back to your side while in a flat bed without using bedrails?: A Little Help needed moving from lying on your  back to sitting on the side of a flat bed without using bedrails?: A Lot Help needed moving to and from a bed to a chair (including a wheelchair)?: A Little Help needed standing up from a chair using your arms (e.g., wheelchair or bedside chair)?: A Little Help needed to walk in hospital room?: A Little Help needed climbing 3-5 steps with a railing? : A Lot 6 Click Score: 16    End of Session Equipment Utilized During Treatment: Gait belt;Oxygen (4L, which is his baseline) Activity Tolerance: Patient limited by fatigue Patient left: with chair alarm set;with call bell/phone within reach;with family/visitor present Nurse Communication: Mobility status;Patient requests pain meds PT Visit Diagnosis: Muscle weakness (generalized) (M62.81);Difficulty in walking, not elsewhere classified (R26.2);Pain Pain - Right/Left: Right Pain - part of body: Leg     Time: 1505-6979 PT Time Calculation (min) (ACUTE ONLY): 41 min  Charges:  $Gait Training: 23-37 mins $Therapeutic Exercise: 8-22 mins                     Kreg Shropshire, DPT 09/05/2020, 12:00 PM

## 2020-09-05 NOTE — Plan of Care (Signed)
Pt calm and cooperative but very anxious at times. On 4 LNC home o2 regimen. On LASIX. Safety measures in  place. Will continue to monitor. Problem: Education: Goal: Knowledge of General Education information will improve Description: Including pain rating scale, medication(s)/side effects and non-pharmacologic comfort measures Outcome: Progressing   Problem: Health Behavior/Discharge Planning: Goal: Ability to manage health-related needs will improve Outcome: Progressing   Problem: Clinical Measurements: Goal: Ability to maintain clinical measurements within normal limits will improve Outcome: Progressing Goal: Will remain free from infection Outcome: Progressing Goal: Diagnostic test results will improve Outcome: Progressing Goal: Respiratory complications will improve Outcome: Progressing Goal: Cardiovascular complication will be avoided Outcome: Progressing   Problem: Activity: Goal: Risk for activity intolerance will decrease Outcome: Progressing   Problem: Nutrition: Goal: Adequate nutrition will be maintained Outcome: Progressing   Problem: Coping: Goal: Level of anxiety will decrease Outcome: Progressing   Problem: Elimination: Goal: Will not experience complications related to bowel motility Outcome: Progressing Goal: Will not experience complications related to urinary retention Outcome: Progressing   Problem: Pain Managment: Goal: General experience of comfort will improve Outcome: Progressing   Problem: Safety: Goal: Ability to remain free from injury will improve Outcome: Progressing   Problem: Skin Integrity: Goal: Risk for impaired skin integrity will decrease Outcome: Progressing

## 2020-09-05 NOTE — Consult Note (Signed)
WOC Nurse Consult Note: Reason for Consult: weeping LEs Patient from home; reported to have compression stockings that did not fit and has ordered new pair that arrived the day patient was admitted. Discussed rationale for use and venous stasis component  Wound type: partial thickness; scabbed ulcers  Pressure Injury POA: NA Measurement: area on the right pretibial 3cm x 2cm area of involvement with several scabs, area on the left pretibial 0.5cm  0.5cm x 0cm  Wound bed: scabbed and purple tissue Drainage (amount, consistency, odor)none Periwound:intact; no significant edema at the time of my arrival  Dressing procedure/placement/frequency: Protect areas with silicone foam; change every 3 days   Begin use of compression stockings at home to avoid further leg ulceration.   Discussed POC with patient and bedside nurse.  Re consult if needed, will not follow at this time. Thanks  Beatryce Colombo [employer], RN,CWOCN, CNS, [place] [phone])

## 2020-09-05 NOTE — Progress Notes (Signed)
Patient gone down for CTA.

## 2020-09-05 NOTE — Progress Notes (Signed)
Mobility Specialist - Progress Note   09/05/20 1400  Mobility  Range of Motion/Exercises Right leg;Left leg (slr, ankle pumps, seated march, hip add/abd)  Level of Assistance Minimal assist, patient does 75% or more  Assistive Device None  Distance Ambulated (ft) 0 ft  Mobility Response Tolerated well  Mobility performed by Mobility specialist  $Mobility charge 1 Mobility    Pre-mobility: 90 HR, 95% SpO2 During mobility: 104 HR, 86% SpO2 Post-mobility: 93 HR, 96% SpO2   Pt was sleeping in recliner utilizing 4L [place] O2 upon arrival. Pt was easily awakened and agreed to session. Pt initially confused on pain levels if any, but then stated "well, maybe my ankles, I would say are about a 5/10". Pt was able to perform seated exercises: ankle pumps, hip abduction, straight leg raises, and seated march with minA. Noted some jerking movements while performing activities. Pt's O2 desat to 86% while performing seated march, however pt denied SOB. Pt was instructed to utilize PLB technique, and O2 sats began to flunctuate between 75%-91%. Mobility increased oxygen to 4.5L to maintain safe range, O2 sat up to 96% and was monitored for 3 mins. Overall, pt tolerated session well. Pt was placed back on 4L before exit, still at 96%. At the end of session, pt stated that he sometimes experiences cramps in thigh area, but no further complaints at this time. Pt remains in recliner with all needs in reach. Nurse was notified.    Kathee Delton Mobility Specialist 09/05/20, 2:32 PM

## 2020-09-05 NOTE — TOC Progression Note (Addendum)
Transition of Care [facility]) - Progression Note    Patient Details  Name: BRIDGET WESTBROOKS MRN: [id number] Date of Birth: Aug 21, 1924  [facility] CM/SW [place], LCSW Phone Number: 09/05/2020, 9:32 AM  Clinical Narrative:  Referral sent to [facility]. Admissions coordinator is aware that they are first preference and will review.   1:15 pm: [facility] is able to offer a bed as long as they have availability on day of discharge. Per MD in morning progression meeting, most likely discharge tomorrow. Admissions coordinator is aware and said they would have a bed. Patient is aware and agreeable. CSW uploaded clinicals into [place] portal to start insurance authorization. Left patient's wife a voicemail.  3:59 pm: Insurance authorization approved: [id number]. COVID results pending.  Expected Discharge Plan: [place] Barriers to Discharge: Continued Medical Work up  Expected Discharge Plan and Services Expected Discharge Plan: [place]   Discharge Planning Services: CM Consult Post Acute Care Choice: [place] Living arrangements for the past 2 months: Apartment                                       Social Determinants of Health (SDOH) Interventions    Readmission Risk Interventions Readmission Risk Prevention Plan 09/04/2020  Transportation Screening Complete  PCP or Specialist Appt within 5-7 Days Complete  Home Care Screening Complete  Medication Review (RN CM) Referral to Pharmacy  Some recent data might be hidden

## 2020-09-05 NOTE — Progress Notes (Signed)
*  PRELIMINARY RESULTS* Echocardiogram 2D Echocardiogram has been performed.  Sherrie Sport 09/05/2020, 8:34 AM

## 2020-09-05 NOTE — Plan of Care (Signed)
  Problem: Elimination: Goal: Will not experience complications related to bowel motility Outcome: Progressing   Problem: Pain Managment: Goal: General experience of comfort will improve Outcome: Progressing   Problem: Education: Goal: Ability to demonstrate management of disease process will improve Outcome: Progressing Patient up to the recliner with one person assist.   Problem: Activity: Goal: Capacity to carry out activities will improve Outcome: Progressing

## 2020-09-06 ENCOUNTER — Ambulatory Visit: Payer: Medicare PPO | Admitting: Family

## 2020-09-06 DIAGNOSIS — I509 Heart failure, unspecified: Secondary | ICD-10-CM

## 2020-09-06 LAB — CBC WITH DIFFERENTIAL/PLATELET
Abs Immature Granulocytes: 0.04 10*3/uL (ref 0.00–0.07)
Basophils Absolute: 0 10*3/uL (ref 0.0–0.1)
Basophils Relative: 0 %
Eosinophils Absolute: 0 10*3/uL (ref 0.0–0.5)
Eosinophils Relative: 0 %
HCT: 41.9 % (ref 39.0–52.0)
Hemoglobin: 15.1 g/dL (ref 13.0–17.0)
Immature Granulocytes: 1 %
Lymphocytes Relative: 4 %
Lymphs Abs: 0.2 10*3/uL — ABNORMAL LOW (ref 0.7–4.0)
MCH: 35.7 pg — ABNORMAL HIGH (ref 26.0–34.0)
MCHC: 36 g/dL (ref 30.0–36.0)
MCV: 99.1 fL (ref 80.0–100.0)
Monocytes Absolute: 0.3 10*3/uL (ref 0.1–1.0)
Monocytes Relative: 6 %
Neutro Abs: 5 10*3/uL (ref 1.7–7.7)
Neutrophils Relative %: 89 %
Platelets: 176 10*3/uL (ref 150–400)
RBC: 4.23 MIL/uL (ref 4.22–5.81)
RDW: 14 % (ref 11.5–15.5)
WBC: 5.5 10*3/uL (ref 4.0–10.5)
nRBC: 0 % (ref 0.0–0.2)

## 2020-09-06 LAB — GLUCOSE, CAPILLARY
Glucose-Capillary: 147 mg/dL — ABNORMAL HIGH (ref 70–99)
Glucose-Capillary: 191 mg/dL — ABNORMAL HIGH (ref 70–99)
Glucose-Capillary: 83 mg/dL (ref 70–99)
Glucose-Capillary: 129 mg/dL — ABNORMAL HIGH (ref 70–99)

## 2020-09-06 LAB — BASIC METABOLIC PANEL
Anion gap: 12 (ref 5–15)
BUN: 26 mg/dL — ABNORMAL HIGH (ref 8–23)
CO2: 27 mmol/L (ref 22–32)
Calcium: 8.9 mg/dL (ref 8.9–10.3)
Chloride: 95 mmol/L — ABNORMAL LOW (ref 98–111)
Creatinine, Ser: 1.2 mg/dL (ref 0.61–1.24)
GFR, Estimated: 56 mL/min — ABNORMAL LOW (ref >60)
Glucose, Bld: 188 mg/dL — ABNORMAL HIGH (ref 70–99)
Potassium: 3.3 mmol/L — ABNORMAL LOW (ref 3.5–5.1)
Sodium: 134 mmol/L — ABNORMAL LOW (ref 135–145)

## 2020-09-06 LAB — BRAIN NATRIURETIC PEPTIDE: B Natriuretic Peptide: 1007.5 pg/mL — ABNORMAL HIGH (ref 0.0–100.0)

## 2020-09-06 MED ORDER — POTASSIUM CHLORIDE CRYS ER 20 MEQ PO TBCR
40.0000 meq | EXTENDED_RELEASE_TABLET | Freq: Once | ORAL | Status: AC
  Administered 2020-09-06: 40 meq via ORAL
  Filled 2020-09-06: qty 2

## 2020-09-06 MED ORDER — POTASSIUM CHLORIDE CRYS ER 20 MEQ PO TBCR
30.0000 meq | EXTENDED_RELEASE_TABLET | Freq: Once | ORAL | Status: AC
  Administered 2020-09-06: 30 meq via ORAL
  Filled 2020-09-06: qty 1

## 2020-09-06 NOTE — Care Management Important Message (Signed)
Important Message  Patient Details  Name: Dylan Knight MRN: [id number] Date of Birth: Aug 29, 1924   Medicare Important Message Given:  Yes     [name] 09/06/2020, 11:30 AM

## 2020-09-06 NOTE — Progress Notes (Addendum)
  Heart Failure Nurse Navigator Note  HFpEF 60-65%, echocardiogram performed on this admission reveals flattened septum, elevated RV pressures, right ventricle is severely enlarged. Right ventricular systolic function is moderately reduced.  Severely elevated PAP at 82.6.  Severely enlarged right atria.  Moderate MR and moderate to severe TR.  Previous echo had revealed normal right ventricular function from 07/20/2020.  He had presented to the ED by way of EMS for increasing SOB,orthopnea and weeping, swollen lower extremities.  Co morbidities:  Diabetes Hyperlipidemia Hypertension Osteoarthritis COPD   Medications:  Wears 4 liters of 02 at home Atenolol 50 mg daily Lasix 40 mg IV BID   Labs:  Sodium 134,potassium 3.3, BUN 26, creatinine 1.2  Weight 65.1 BMI 23.16     Intake 240 ml Output 1000 ml  CTA revealed no acute pulmonary embolus.  Persistent finding of interstitial lung disease.   Assessment:  General- he is awake and alert, wife is at the bedside.  States he is not quite back to his baseline.  HEENT- HOH. Pupils equal.  Cardiac- heart tones regular  Chest- scattered rhonchi   Musculoskeletal-1+ edema pitting, dressings placed by wound nurse intact on bilateral lower legs and heal of right foot.  Neuro- speech is clear, moves all extremities   Psych- is pleasant and appropriate.   Visited with patient and wife on two different occasions.  Discussed importance of limiting fluid intake to less than 2000 ml daily and stressed importance of also limiting sodium to 1500 to 2000 mg daily.  Also discussed using fresh and frozen vegetable, or if have to use canned to rinse very well.  Also discussed reporting signs and symptoms of increased lower leg edema, especially if is present in the morning with waking up.  Went over other symptoms of PND, Orthopnea, cough, increasing abdominal girth, pointed out if one day pants were comfortable fitting and the next day  tight.  All these need to be reported to cardiologist or Darylene Price in HF clinic.  Both voice understanding.  Will continue to follow.  Pricilla Riffle RN, CHFN

## 2020-09-06 NOTE — Progress Notes (Signed)
PROGRESS NOTE  LEMOINE GOYNE YPP:[id number] DOB: 07/14/1924 DOA: 09/03/2020 PCP: Tonia Ghent, MD  Brief History   MURL GOLLADAY is a 86 y.o. male with medical history significant of known systolic CHF, diabetes, hyperlipidemia, hypertension, hypothyroidism, peripheral vascular disease, pulmonary fibrosis BPH, osteoarthritis, hyperlipidemia and COPD who has been having significant weight gain and fluid retention over the last couple of weeks.  Patient has had increased dose of his home Lasix but continues to be significantly fluid overloaded.  He has had anasarca.  Currently over 10 pounds above his usual weight.  Came to the ER with progressive shortness of breath and worsening lower extremity edema..  Denied any sick contact.  No fever or chills no significant cough.  Patient seen and evaluated and appears to have anasarca consistent with worsening CHF.  He is being admitted to the hospital for further evaluation and treatment.  Patient has been communicated with his primary care physician was outpatient treatment and adjustment of medications attempted but has not been successful so he has failed outpatient treatment..  ED Course: Temperature 98.4 blood pressure 143/80 pulse 83 respirate 20 oxygen sat 90% 2 L.  CBC sent chemistry mostly within normal except creatinine 1.43 and glucose 111.  Chest x-ray showed interstitial lung disease consistent with known interstitial pneumonia.  COVID-19 and viral screen currently pending. Admitted for further treatment.  The patient has been admitted to a telemetry bed. Diuresis had to be discontinued last night due to the patient's low blood pressures. Echocardiogram is pending.  The patient has been admitted to a telemetry bed. Wound care was consulted for the weeping in his legs and have recommended compression hose that fit appropriately.  Echocardiogram was obtained on 09/05/2020. It demonstrated LV EF of 55-60% with no regional motion abnormalities  with septal flattening consistent with RV pressure and volume overload. RV function is moderately reduced with severely enlarged RV. The PASP was severely elevated to 82.6 mmHg. Right atrial size is severely dilalted. There was mild mitral valve regurgitation. Tricuspid regurgitation is moderate to severe. This echocardiogram is substantially worse than that read in 07/2020.  CTA of the chest was negative for pulmonary embolus. It did demonstrated ground glass opacities and possible bronchiolitis. Will check lower extremity doppler to rule out DVT.  Cardiology has been consulted, and the heart failure team is involved. Attempts to diurese the patient have been complicated by hypotension. Right heart catheterization is being considered.  Consultants  . Cardiology  Procedures  . None  Antibiotics   Anti-infectives (From admission, onward)   None     Subjective  The patient is resting comfortably. No new complaints.   Objective   Vitals:  Vitals:   09/05/20 2341 09/06/20 0410  BP: (!) 116/96 119/62  Pulse: 99 83  Resp: 20 20  Temp: (!) 97.5 F (36.4 C) (!) 97.5 F (36.4 C)  SpO2: 98% 96%   Exam:  Constitutional:  . The patient is awake, alert, and oriented x 3. No acute distress.  Respiratory:  . No increased work of breathing. . No wheezes or rhonchi . Positive for rales at bases. . No tactile fremitus Cardiovascular:  . Regular rate and rhythm . No murmurs, ectopy, or gallups. . No lateral PMI. No thrills. Abdomen:  . Abdomen is soft, non-tender, non-distended . No hernias, masses, or organomegaly . Normoactive bowel sounds.  Musculoskeletal:  . No cyanosis, clubbing, 1-2 + pitting edema of lower extremities bilaterally. Skin:  . No rashes, lesions, ulcers .  palpation of skin: no induration or nodules . Mild erythema to bilateral lower extremities, left worse than right.  . Wrinkling present demonstrating success of diuresis. Neurologic:  . CN 2-12  intact . Sensation all 4 extremities intact Psychiatric:  . Mental status o Mood, affect appropriate o Orientation to person, place, time  . judgment and insight appear intact  I have personally reviewed the following:   Today's Data  . Vitals, BMP, CBC, BNP  Imaging  . CXR . Echocardiogram: Moderately decreased function of right ventricle with RV volume overload. Severe elevation of PASP. Moderate to severe tricusspid regurgitation. EF 55-60%. Much worse than in September 2021. Marland Kitchen CTA chest negative for PE. Positive for ground glass opacities and bronchiolitis.   Scheduled Meds: . clopidogrel  75 mg Oral Daily  . enoxaparin (LOVENOX) injection  40 mg Subcutaneous Q2200  . ezetimibe  10 mg Oral Daily   And  . simvastatin  40 mg Oral Daily  . furosemide  40 mg Intravenous BID  . gabapentin  200 mg Oral TID  . guaiFENesin  600 mg Oral BID  . insulin aspart  0-5 Units Subcutaneous QHS  . insulin aspart  0-9 Units Subcutaneous TID WC  . levothyroxine  50 mcg Oral Q0600  . sodium chloride flush  3 mL Intravenous Q12H   Continuous Infusions: . sodium chloride      Principal Problem:   Acute exacerbation of CHF (congestive heart failure) (HCC) Active Problems:   COPD (chronic obstructive pulmonary disease) (HCC)   Diabetes mellitus type 2, controlled ([place])   CAD (coronary artery disease)   Hypothyroidism   Hyperlipemia   Edema   Acute respiratory failure with hypoxia (HCC)   ILD (interstitial lung disease) ([place])   LOS: 3 days   A & P  Acute Right systolic failure: Echocardiogram in 07/2020 demonstrated normal RV function and size. Echocardiogram obtained this morning demonstrates a marked reduction in systolic function of the right ventricle, severe elevation of PASP, and . He has been receiving Lasix to 40 mg twice daily IV as allowed by his low blood pressures.I have consulted Dr. Rockey Situ. CTA chest is negative for PE. Will check Doppler of lower extremities to rule out  DVT. Continue diuresis as possible given hypotension. I appreciate the assistance of cardiology and the heart failure team. Consideration is given to possible Right Heart Catheterization to further investigate pressures.  COPD: No acute exacerbation.  Continue beta agonists.   DM II: Monitor glucoses with sliding scale insulin.  Peripheral neuropathy: Patient is complaining of pain in lower extremities bilaterally. He complains of the sensation of his skin stretching, but I also wonder about neuropathy. Will start low dose gabapentin.  Chronic venous stasis dermatitis:  Continue monitoring and diuresis. Compression hose should help when he is home.  Interstitial lung disease: Patient at baseline.  Continue oxygen at 4 L/min home regimen. Made worse by pulmonary edema due to CHF exacerbation.  Hypothyroidism: Continue with levothyroxine  Hyperlipidemia: Resume on continue home regimen  Coronary artery disease: Stable. Continue Plavix. Pt unable to tolerate continuation of the beta blocker due to hypotension.  I have seen and examined this patient myself. I have spent 34 minutes in his evaluation and care.  DVT prophylaxis: Lovenox Code Status: DNR Family Communication: None available. Disposition Plan: Home Status is: Inpatient  Remains inpatient appropriate because:IV treatments appropriate due to intensity of illness or inability to take PO   Dispo: The patient is from: Home  Anticipated d/c is to: Home              Anticipated d/c date is: 3 days              Patient currently is not medically stable to d/c.  Severity of Illness: The appropriate patient status for this patient is INPATIENT. Inpatient status is judged to be reasonable and necessary in order to provide the required intensity of service to ensure the patient's safety. The patient's presenting symptoms, physical exam findings, and initial radiographic and laboratory data in the context of their  chronic comorbidities is felt to place them at high risk for further clinical deterioration. Furthermore, it is not anticipated that the patient will be medically stable for discharge from the hospital within 2 midnights of admission. The following factors support the patient status of inpatient.   "           The patient's presenting symptoms include lower extremities edema. "           The worrisome physical exam findings include chronic stasis dermatitis with lower extremity edema. "           The initial radiographic and laboratory data are worrisome because of evidence of interstitial lung disease. "           The chronic co-morbidities include CHF interstitial lung disease.   Averill Winters, DO [facility] Direct contact: see [email or web address]  7PM-7AM contact night coverage as above 09/06/2020, 4:07 PM  LOS: 1 day

## 2020-09-06 NOTE — TOC Progression Note (Addendum)
Transition of Care [facility]) - Progression Note    Patient Details  Name: Dylan Knight MRN: [id number] Date of Birth: 11-16-1923  Transition of [facility]) CM/SW [place], LCSW Phone Number: 09/06/2020, 9:32 AM  Clinical Narrative:  Per MD, patient will not discharge today. Cardiology consulted. MD unsure if patient will discharge over the weekend. [facility] can take patient over the weekend as long as discharge summary is in by 3:00 today. MD is aware.   10:49 am: MD unsure if we will have final discharge medication recommendations by 3:00. If not, [facility] can accept patient on Monday. Insurance authorization is valid through Tuesday 11/2. Patient will need a new COVID test if he discharges after Saturday.  4:04 pm: Plan for discharge Monday if stable. Patient sleeping. Called and updated wife.  Expected Discharge Plan: [place] Barriers to Discharge: Continued Medical Work up  Expected Discharge Plan and Services Expected Discharge Plan: [place]   Discharge Planning Services: CM Consult Post Acute Care Choice: [place] Living arrangements for the past 2 months: Apartment                                       Social Determinants of Health (SDOH) Interventions    Readmission Risk Interventions Readmission Risk Prevention Plan 09/04/2020  Transportation Screening Complete  PCP or Specialist Appt within 5-7 Days Complete  Home Care Screening Complete  Medication Review (RN CM) Referral to Pharmacy  Some recent data might be hidden

## 2020-09-06 NOTE — Plan of Care (Signed)
Continuing with plan of care. 

## 2020-09-06 NOTE — Progress Notes (Signed)
Occupational Therapy Treatment Patient Details Name: CHAMP KEETCH MRN: [id number] DOB: 05-01-1924 Today's Date: 09/06/2020    History of present illness 89yoM here with b/l LE swelling and CHF exacerbation.  He  was admitted 9/10 w/ SOB. PMH: CAD, OA, BPH, CAD, DM, HTN, HLD, hypoTSH, PVD, remote CVA c balance impairment. Pt recently treated for CAP.   OT comments  Upon entering the room, pt sleeping soundly and OT having difficulty arousing pt for therapeutic intervention. Pt's phone begins to ring and pt becomes alert and answers. Pt asking therapist to assist him with ordering his dinner. Pt performing grooming tasks with set up A. Pt then declined further OOB activities and bed level exercise secondary to fatigue. Pt requesting assistance from therapist for bed positioning and needing min A for comfort. All needs within reach. Pt resting comfortable as therapist exits the room.   Follow Up Recommendations  SNF    Equipment Recommendations  None recommended by OT       Precautions / Restrictions Precautions Precautions: Fall              ADL either performed or assessed with clinical judgement   ADL Overall ADL's : Needs assistance/impaired     Grooming: Wash/dry hands;Wash/dry face;Set up;Sitting            Vision Patient Visual Report: No change from baseline            Cognition Arousal/Alertness: Awake/alert Behavior During Therapy: WFL for tasks assessed/performed Overall Cognitive Status: Within Functional Limits for tasks assessed                        Pertinent Vitals/ Pain       Pain Assessment: No/denies pain         Frequency  Min 2X/week        Progress Toward Goals  OT Goals(current goals can now be found in the care plan section)  Progress towards OT goals: Progressing toward goals  Acute Rehab OT Goals Patient Stated Goal: to go to rehab OT Goal Formulation: With patient Time For Goal Achievement: 09/18/20 Potential  to Achieve Goals: Good ADL Goals Pt Will Perform Grooming: with supervision;standing Pt Will Transfer to Toilet: with supervision;ambulating Pt Will Perform Toileting - Clothing Manipulation and hygiene: with supervision;sit to/from stand  Plan Discharge plan remains appropriate       AM-PAC OT "6 Clicks" Daily Activity     Outcome Measure   Help from another person eating meals?: None Help from another person taking care of personal grooming?: None Help from another person toileting, which includes using toliet, bedpan, or urinal?: A Lot Help from another person bathing (including washing, rinsing, drying)?: A Lot Help from another person to put on and taking off regular upper body clothing?: A Little Help from another person to put on and taking off regular lower body clothing?: A Lot 6 Click Score: 17    End of Session    OT Visit Diagnosis: Unsteadiness on feet (R26.81);Muscle weakness (generalized) (M62.81);History of falling (Z91.81);Repeated falls (R29.6)   Activity Tolerance Patient limited by fatigue   Patient Left in bed;with call bell/phone within reach;with bed alarm set           Time: 4174-0814 OT Time Calculation (min): 21 min  Charges: OT General Charges $OT Visit: 1 Visit OT Treatments $Self Care/Home Management : 8-22 mins $Therapeutic Activity: 8-22 mins  Darleen Crocker, MS, OTR/L , CBIS ascom [phone]  09/06/20, 4:46  PM

## 2020-09-06 NOTE — Progress Notes (Addendum)
Progress Note  Patient Name: Dylan Knight Date of Encounter: 09/06/2020  Primary Cardiologist:[facility], Dr. Rockey Situ  Subjective   States that he feels significantly improved when compared with yesterday.  He has been completing PT and reports that he had a successful session today.    At the time of my visit, he is off of nasal cannula oxygen and eating.  He does not seem acutely short of breath off of oxygen; however, he does note his intention to put his oxygen back on as I am leaving the room.  No chest pain.  He reported feeling earlier racing heart rate, and at the time of telemetry showing elevated rates.  No palpitations.  No presyncope or syncope.  He does not feel lightheaded or dizzy.  He feels like the swelling in his legs is improving.  He does report that he was significantly confused overnight.  Inpatient Medications    Scheduled Meds: . clopidogrel  75 mg Oral Daily  . enoxaparin (LOVENOX) injection  40 mg Subcutaneous Q2200  . ezetimibe  10 mg Oral Daily   And  . simvastatin  40 mg Oral Daily  . furosemide  40 mg Intravenous BID  . gabapentin  200 mg Oral TID  . guaiFENesin  600 mg Oral BID  . insulin aspart  0-5 Units Subcutaneous QHS  . insulin aspart  0-9 Units Subcutaneous TID WC  . levothyroxine  50 mcg Oral Q0600  . potassium chloride  30 mEq Oral Once  . sodium chloride flush  3 mL Intravenous Q12H   Continuous Infusions: . sodium chloride     PRN Meds: sodium chloride, acetaminophen, albuterol, HYDROcodone-acetaminophen, ondansetron (ZOFRAN) IV, sodium chloride flush   Vital Signs    Vitals:   09/06/20 0410 09/06/20 0500 09/06/20 0834 09/06/20 1208  BP: 119/62  127/63 (!) 81/46  Pulse: 83  98 87  Resp: 20  16 20   Temp: (!) 97.5 F (36.4 C)  98.1 F (36.7 C) 98.9 F (37.2 C)  TempSrc: Oral  Oral   SpO2: 96%  91% 99%  Weight:  65.1 kg    Height:        Intake/Output Summary (Last 24 hours) at 09/06/2020 1333 Last data filed at  09/06/2020 1028 Gross per 24 hour  Intake 240 ml  Output 1000 ml  Net -760 ml   Last 3 Weights 09/06/2020 09/05/2020 09/04/2020  Weight (lbs) 143 lb 8.3 oz 143 lb 12.8 oz 146 lb 1.6 oz  Weight (kg) 65.1 kg 65.227 kg 66.271 kg      Telemetry    NSR with PACs and compensatory pauses under 2 seconds, PVCs, episode of sinus tachycardia.- Personally Reviewed  ECG    No new tracings- Personally Reviewed  Physical Exam   GEN: No acute distress.  Eating his lunch. Neck:  JVD difficult to assess due to position of patient Cardiac: RRR, 2/6 systolic murmur best appreciated LLSB, rubs, or gallops.  Respiratory:  Course breath sounds bilaterally. L sided adventitious lung sounds, likely 2/2 eating at time of exam. GI: Soft, nontender, non-distended  MS:  1+ left lower extremity edema > right lower extremity moderate edema bandages noted on both of his anterior lower extremities and placed over his tibia; No deformity. Neuro:  Nonfocal  Psych: Normal affect   Labs    High Sensitivity Troponin:   Recent Labs  Lab 09/03/20 1708 09/03/20 2225  TROPONINIHS 14 17      Cardiac EnzymesNo results for input(s): TROPONINI  in the last 168 hours. No results for input(s): TROPIPOC in the last 168 hours.   Chemistry Recent Labs  Lab 09/04/20 1300 09/05/20 0526 09/06/20 0443  NA 134* 133* 134*  K 3.5 3.5 3.3*  CL 97* 95* 95*  CO2 26 27 27   GLUCOSE 145* 111* 188*  BUN 21 26* 26*  CREATININE 1.22 1.25* 1.20  CALCIUM 9.2 9.0 8.9  GFRNONAA 55* 53* 56*  ANIONGAP 11 11 12      Hematology Recent Labs  Lab 09/03/20 2225 09/04/20 1150 09/06/20 0443  WBC 8.3 8.9 5.5  RBC 4.35 4.47 4.23  HGB 15.3 15.7 15.1  HCT 45.3 45.6 41.9  MCV 104.1* 102.0* 99.1  MCH 35.2* 35.1* 35.7*  MCHC 33.8 34.4 36.0  RDW 14.2 14.7 14.0  PLT 200 221 176    BNP Recent Labs  Lab 09/03/20 1708  BNP 1,496.3*     DDimer No results for input(s): DDIMER in the last 168 hours.   Radiology    CT ANGIO  CHEST PE W OR WO CONTRAST  Result Date: 09/05/2020 CLINICAL DATA:  Worsening shortness of breath.  Orthopnea. EXAM: CT ANGIOGRAPHY CHEST WITH CONTRAST TECHNIQUE: Multidetector CT imaging of the chest was performed using the standard protocol during bolus administration of intravenous contrast. Multiplanar CT image reconstructions and MIPs were obtained to evaluate the vascular anatomy. CONTRAST:  25mL OMNIPAQUE IOHEXOL 350 MG/ML SOLN COMPARISON:  CT chest dated 07/22/2020 FINDINGS: Cardiovascular: Contrast injection is sufficient to demonstrate satisfactory opacification of the pulmonary arteries to the segmental level. There is no pulmonary embolus or evidence of right heart strain. The size of the main pulmonary artery is normal. Normal heart size with coronary artery calcification. There are atherosclerotic changes throughout the thoracic aorta without evidence for an aneurysm. Mediastinum/Nodes: --mild mediastinal adenopathy is noted, likely reactive. -- No hilar lymphadenopathy. -- No axillary lymphadenopathy. -- No supraclavicular lymphadenopathy. -- Normal thyroid gland where visualized. -  Unremarkable esophagus. Lungs/Pleura: Moderate severe emphysematous changes are again noted. There is persistent ground-glass opacification of the right lobe as before. There is slight interval worsening at the right lung base. There is mucous plugging and bronchial wall thickening primarily in the right lower lobe. There is no pneumothorax. No large pleural effusion. There is secretions within the upper trachea, otherwise the trachea is unremarkable. The previously demonstrated 1 cm pulmonary nodule in the right lower lobe appears to have essentially resolved. Upper Abdomen: Contrast bolus timing is not optimized for evaluation of the abdominal organs. The visualized portions of the organs of the upper abdomen are normal. Musculoskeletal: No chest wall abnormality. No bony spinal canal stenosis. Review of the MIP  images confirms the above findings. IMPRESSION: 1. No evidence for acute pulmonary embolus. 2. Persistent findings of interstitial lung disease, not substantially changed from prior study. 3. New bronchial wall thickening and areas of consolidation involving primarily the right lower lobe is suggestive of infectious or reactive bronchiolitis. Aortic Atherosclerosis (ICD10-I70.0) and Emphysema (ICD10-J43.9). Electronically Signed   By: Constance Holster M.D.   On: 09/05/2020 20:12   ECHOCARDIOGRAM COMPLETE  Result Date: 09/05/2020    ECHOCARDIOGRAM REPORT   Patient Name:   Dylan Knight Date of Exam: 09/05/2020 Medical Rec #:  [id number]       Height:       66.0 in Accession #:    [id number]      Weight:       143.8 lb Date of Birth:  06/04/1924  BSA:          1.738 m Patient Age:    67 years        BP:           140/68 mmHg Patient Gender: M               HR:           78 bpm. Exam Location:  [facility] Procedure: 2D Echo, Cardiac Doppler and Color Doppler Indications:     CHF-acute systolic 213.08  History:         Patient has prior history of Echocardiogram examinations, most                  recent 07/20/2020. COPD and Stroke; Risk Factors:Hypertension                  and Diabetes.  Sonographer:     JERRY Referring Phys:  [id number] AVA SWAYZE Diagnosing Phys: Ida Rogue MD  Sonographer Comments: Technically challenging study due to limited acoustic windows, no apical window and no parasternal window. Image acquisition challenging due to patient body habitus. IMPRESSIONS  1. Left ventricular ejection fraction, by estimation, is 60 to 65%. The left ventricle has normal function. The left ventricle has no regional wall motion abnormalities with septal flattening consistent with RV pressure and volume overload..  2. Right ventricular systolic function is moderately reduced. The right ventricular size is severely enlarged. There is severely elevated pulmonary artery systolic pressure. The estimated right  ventricular systolic pressure is 46.9 mmHg.  3. Right atrial size was severely dilated.  4. Mild mitral valve regurgitation.  5. Tricuspid valve regurgitation is moderate to severe. FINDINGS  Left Ventricle: Left ventricular ejection fraction, by estimation, is 55 to 60%. The left ventricle has normal function. The left ventricle has no regional wall motion abnormalities. The left ventricular internal cavity size was normal in size. There is  no left ventricular hypertrophy. Left ventricular diastolic parameters are indeterminate. Right Ventricle: The right ventricular size is severely enlarged. No increase in right ventricular wall thickness. Right ventricular systolic function is moderately reduced. There is severely elevated pulmonary artery systolic pressure. The tricuspid regurgitant velocity is 4.26 m/s, and with an assumed right atrial pressure of 10 mmHg, the estimated right ventricular systolic pressure is 62.9 mmHg. Left Atrium: Left atrial size was normal in size. Right Atrium: Right atrial size was severely dilated. Pericardium: There is no evidence of pericardial effusion. Mitral Valve: The mitral valve is normal in structure. Mild mitral valve regurgitation. No evidence of mitral valve stenosis. Tricuspid Valve: The tricuspid valve is normal in structure. Tricuspid valve regurgitation is moderate to severe. No evidence of tricuspid stenosis. Aortic Valve: The aortic valve is normal in structure. Aortic valve regurgitation is not visualized. Mild to moderate aortic valve sclerosis/calcification is present, without any evidence of aortic stenosis. Pulmonic Valve: The pulmonic valve was normal in structure. Pulmonic valve regurgitation is not visualized. No evidence of pulmonic stenosis. Aorta: The aortic root is normal in size and structure. Venous: The pulmonary veins were not well visualized. The inferior vena cava is normal in size with greater than 50% respiratory variability, suggesting right atrial  pressure of 3 mmHg. IAS/Shunts: No atrial level shunt detected by color flow Doppler.  LEFT VENTRICLE PLAX 2D LVIDd:         2.74 cm LVIDs:         1.80 cm LV PW:         1.34  cm LV IVS:        1.32 cm  LEFT ATRIUM         Index LA diam:    3.80 cm 2.19 cm/m  PULMONIC VALVE PV Vmax:        0.68 m/s PV Peak grad:   1.9 mmHg RVOT Peak grad: 2 mmHg  TRICUSPID VALVE TR Peak grad:   72.6 mmHg TR Vmax:        426.00 cm/s Ida Rogue MD Electronically signed by Ida Rogue MD Signature Date/Time: 09/05/2020/12:51:23 PM    Final     Cardiac Studies   2D echo 09/05/2020: 1. Left ventricular ejection fraction, by estimation, is 60 to 65%. The  left ventricle has normal function. The left ventricle has no regional  wall motion abnormalities with septal flattening consistent with RV  pressure and volume overload.  2. Right ventricular systolic function is moderately reduced. The right  ventricular size is severely enlarged. There is severely elevated  pulmonary artery systolic pressure. The estimated right ventricular  systolic pressure is 69.4 mmHg.  3. Right atrial size was severely dilated.  4. Mild mitral valve regurgitation.  5. Tricuspid valve regurgitation is moderate to severe. __________  2D echo 07/20/2020 ([place]): 1. Left ventricular ejection fraction, by estimation, is 60 to 65%. The  left ventricle has normal function. The left ventricle has no regional  wall motion abnormalities. There is mild concentric left ventricular  hypertrophy. Left ventricular diastolic  parameters are consistent with Grade I diastolic dysfunction (impaired  relaxation).  2. Right ventricular systolic function is normal. The right ventricular  size is normal.  3. Left atrial size was mildly dilated.  4. The mitral valve is normal in structure. Trivial mitral valve  regurgitation. No evidence of mitral stenosis. Severe mitral annular  calcification.  5. Tricuspid valve regurgitation  is mild to moderate.  6. The aortic valve is calcified. Aortic valve regurgitation is not  visualized. Mild aortic valve sclerosis is present, with no evidence of  aortic valve stenosis.  7. The inferior vena cava is normal in size with greater than 50%  respiratory variability, suggesting right atrial pressure of 3 mmHg.  Patient Profile     86 y.o. male with history of chronic hypoxic respiratory failure on supplemental oxygen via nasal cannula at 4 L secondary to ILD, pulmonary hypertension, COPD, diastolic dysfunction, CKD 2, diabetes, hypertension, hyperlipidemia, hypothyroidism, PVD s/p right femoral-popliteal U bypass in 08/2001 and aortobifemoral bypass in 2003, carotid artery disease s/p LICA stenting, BPH, OA, and being seen today for exacerbation of HFpEF  with pulmonary hypertension.   Assessment & Plan    Acute on chronic hypoxic respiratory failure secondary to ILD, pulmonary hypertension, RV dysfunction, and COPD/lower extremity edema --Reports improvement in his breathing status.  He is off nasal cannula oxygen at the time of my exam but reports his intention to place nasal cannula oxygen back on after our discussion. --Updated echo as above with enlarged RV cavity and PASP of 82. --Wears 4L [place] at home.  --CTA negative for PE but showing possible infectious or reactive bronchiolitis.  Could consider VQ scan to evaluate for chronic PE.   --Currently continues on IV Lasix 40 mg twice daily.  Would recommend repeat blood pressure to confirm his SBP in the 80s on IV diuresis.  Placed repeat order for BP. Also ordered BNP. Continue IV diuresis. Will discuss further recommendations with MD.  --Would avoid restart of beta-blocker, given his hypotension, as well as  his lung disease. --Output 1 L yesterday with net -760 cc. Wt 65.1kg and stable from yesterday. --Daily BMET.  Cr 1.25  1.20 with stable BUN 26.  Baseline creatinine appears close to 1.2. --Replete electrolytes. Ordered  additional potassium for repletion. --Consider RHC to further understand his hemodynamics.   --Consider also lower extremity ultrasound, given asymmetric edema with LLE> RLE.  Hypokalemia --K3.3.  Ordered additional potassium supplementation.  Replete with goal 4.0.  Check magnesium.  Daily BMET.  He likely will need daily potassium supplementation on diuresis.  HTN --BP low/currently hypotensive.  Recommend hold parameters for IV Lasix.  Recommend close monitoring of vitals.  HLD --Continue statin and Zetia.  PAD/carotid artery disease --Continue clopidogrel.  Most recent ABIs 04/2020 and improved from prior.  Patent L ICA stent by noninvasive imaging 04/2020.  Hypothyroidism -TSH elevated on levothyroxine supplementation.  Defer to IM.  For questions or updates, please contact [place] Please consult [email or web address] for contact info under        Signed, Arvil Chaco, PA-C  09/06/2020, 1:33 PM

## 2020-09-07 ENCOUNTER — Inpatient Hospital Stay: Payer: Medicare PPO

## 2020-09-07 DIAGNOSIS — I5031 Acute diastolic (congestive) heart failure: Secondary | ICD-10-CM

## 2020-09-07 DIAGNOSIS — I2510 Atherosclerotic heart disease of native coronary artery without angina pectoris: Secondary | ICD-10-CM

## 2020-09-07 DIAGNOSIS — J432 Centrilobular emphysema: Secondary | ICD-10-CM | POA: Diagnosis not present

## 2020-09-07 LAB — BASIC METABOLIC PANEL
Anion gap: 8 (ref 5–15)
BUN: 28 mg/dL — ABNORMAL HIGH (ref 8–23)
CO2: 29 mmol/L (ref 22–32)
Calcium: 8.7 mg/dL — ABNORMAL LOW (ref 8.9–10.3)
Chloride: 99 mmol/L (ref 98–111)
Creatinine, Ser: 1.2 mg/dL (ref 0.61–1.24)
GFR, Estimated: 56 mL/min — ABNORMAL LOW (ref >60)
Glucose, Bld: 153 mg/dL — ABNORMAL HIGH (ref 70–99)
Potassium: 3.3 mmol/L — ABNORMAL LOW (ref 3.5–5.1)
Sodium: 136 mmol/L (ref 135–145)

## 2020-09-07 LAB — GLUCOSE, CAPILLARY
Glucose-Capillary: 119 mg/dL — ABNORMAL HIGH (ref 70–99)
Glucose-Capillary: 112 mg/dL — ABNORMAL HIGH (ref 70–99)
Glucose-Capillary: 164 mg/dL — ABNORMAL HIGH (ref 70–99)
Glucose-Capillary: 108 mg/dL — ABNORMAL HIGH (ref 70–99)

## 2020-09-07 LAB — BRAIN NATRIURETIC PEPTIDE: B Natriuretic Peptide: 479.4 pg/mL — ABNORMAL HIGH (ref 0.0–100.0)

## 2020-09-07 MED ORDER — POTASSIUM CHLORIDE CRYS ER 20 MEQ PO TBCR
20.0000 meq | EXTENDED_RELEASE_TABLET | Freq: Two times a day (BID) | ORAL | Status: DC
  Administered 2020-09-08 – 2020-09-09 (×3): 20 meq via ORAL
  Filled 2020-09-07 (×3): qty 1

## 2020-09-07 MED ORDER — POTASSIUM CHLORIDE CRYS ER 20 MEQ PO TBCR
40.0000 meq | EXTENDED_RELEASE_TABLET | Freq: Two times a day (BID) | ORAL | Status: AC
  Administered 2020-09-07 (×2): 40 meq via ORAL
  Filled 2020-09-07 (×2): qty 2

## 2020-09-07 NOTE — Progress Notes (Signed)
The patient's B/P is 91/42 (56) and asymptomatic.  Dr. Benny Lennert notified, will continue to monitor.

## 2020-09-07 NOTE — Plan of Care (Signed)
Continuing with plan of care. 

## 2020-09-07 NOTE — Progress Notes (Signed)
Progress Note  Patient Name: Dylan Knight Date of Encounter: 09/07/2020  Primary Cardiologist: [facility], Dr. Rockey Situ  Subjective   No CP, racing HR, palpitations.  Feels as if he is breathing well, though on inspection of nasal cannula oxygen level, it appears that he has been pushed up to 4.5 L.  He typically uses 4 L nasal cannula oxygen at home.  He has not yet ambulated today.  He reports increased urine output, which she states is reassuring to him.  He reports improved volume status and that he feels close to his normal.  Reports leg cramps overnight, resolving with potassium supplementation.  He is excited to order his lunch and plans to get the pot roast.  Inpatient Medications    Scheduled Meds: . clopidogrel  75 mg Oral Daily  . enoxaparin (LOVENOX) injection  40 mg Subcutaneous Q2200  . ezetimibe  10 mg Oral Daily   And  . simvastatin  40 mg Oral Daily  . furosemide  40 mg Intravenous BID  . gabapentin  200 mg Oral TID  . guaiFENesin  600 mg Oral BID  . insulin aspart  0-5 Units Subcutaneous QHS  . insulin aspart  0-9 Units Subcutaneous TID WC  . levothyroxine  50 mcg Oral Q0600  . sodium chloride flush  3 mL Intravenous Q12H   Continuous Infusions: . sodium chloride     PRN Meds: sodium chloride, acetaminophen, albuterol, HYDROcodone-acetaminophen, ondansetron (ZOFRAN) IV, sodium chloride flush   Vital Signs    Vitals:   09/06/20 2326 09/07/20 0500 09/07/20 0540 09/07/20 0837  BP: (!) 105/44  100/64 106/64  Pulse: 90  86 (!) 107  Resp: 20  20 16   Temp: 97.8 F (36.6 C)  97.6 F (36.4 C) 98.2 F (36.8 C)  TempSrc: Oral  Oral Oral  SpO2: 97%  97% 91%  Weight:  63.9 kg    Height:        Intake/Output Summary (Last 24 hours) at 09/07/2020 0913 Last data filed at 09/07/2020 0550 Gross per 24 hour  Intake 600 ml  Output 2100 ml  Net -1500 ml   Last 3 Weights 09/07/2020 09/06/2020 09/05/2020  Weight (lbs) 140 lb 14 oz 143 lb 8.3 oz 143 lb  12.8 oz  Weight (kg) 63.9 kg 65.1 kg 65.227 kg      Telemetry    NSR to ST with rates 80s to low 100s, PVCs, PACs, sinus pauses under 2 seconds.- Personally Reviewed  ECG    No new tracings- Personally Reviewed  Physical Exam   GEN: No acute distress.  Ordering lunch. Neck:  JVP ~ 10cm Cardiac: RRR, 2/6 systolic murmur best appreciated LLSB.  No rubs or gallops.  Respiratory:  Intermittent expiratory wheezing.  Course breath sounds bilaterally.  Improved breath sounds at the bases. GI: Soft, nontender, non-distended  MS:  Improved lower extremity edema.  Bandages noted on both of his anterior lower extremities and placed over his tibia; No deformity. Neuro:  Nonfocal  Psych: Normal affect   Labs    High Sensitivity Troponin:   Recent Labs  Lab 09/03/20 1708 09/03/20 2225  TROPONINIHS 14 17      Cardiac EnzymesNo results for input(s): TROPONINI in the last 168 hours. No results for input(s): TROPIPOC in the last 168 hours.   Chemistry Recent Labs  Lab 09/05/20 0526 09/06/20 0443 09/07/20 0521  NA 133* 134* 136  K 3.5 3.3* 3.3*  CL 95* 95* 99  CO2 27 27 29  GLUCOSE 111* 188* 153*  BUN 26* 26* 28*  CREATININE 1.25* 1.20 1.20  CALCIUM 9.0 8.9 8.7*  GFRNONAA 53* 56* 56*  ANIONGAP 11 12 8      Hematology Recent Labs  Lab 09/03/20 2225 09/04/20 1150 09/06/20 0443  WBC 8.3 8.9 5.5  RBC 4.35 4.47 4.23  HGB 15.3 15.7 15.1  HCT 45.3 45.6 41.9  MCV 104.1* 102.0* 99.1  MCH 35.2* 35.1* 35.7*  MCHC 33.8 34.4 36.0  RDW 14.2 14.7 14.0  PLT 200 221 176    BNP Recent Labs  Lab 09/03/20 1708 09/06/20 1436  BNP 1,496.3* 1,007.5*     DDimer No results for input(s): DDIMER in the last 168 hours.   Radiology    CT ANGIO CHEST PE W OR WO CONTRAST  Result Date: 09/05/2020 CLINICAL DATA:  Worsening shortness of breath.  Orthopnea. EXAM: CT ANGIOGRAPHY CHEST WITH CONTRAST TECHNIQUE: Multidetector CT imaging of the chest was performed using the standard protocol  during bolus administration of intravenous contrast. Multiplanar CT image reconstructions and MIPs were obtained to evaluate the vascular anatomy. CONTRAST:  51mL OMNIPAQUE IOHEXOL 350 MG/ML SOLN COMPARISON:  CT chest dated 07/22/2020 FINDINGS: Cardiovascular: Contrast injection is sufficient to demonstrate satisfactory opacification of the pulmonary arteries to the segmental level. There is no pulmonary embolus or evidence of right heart strain. The size of the main pulmonary artery is normal. Normal heart size with coronary artery calcification. There are atherosclerotic changes throughout the thoracic aorta without evidence for an aneurysm. Mediastinum/Nodes: --mild mediastinal adenopathy is noted, likely reactive. -- No hilar lymphadenopathy. -- No axillary lymphadenopathy. -- No supraclavicular lymphadenopathy. -- Normal thyroid gland where visualized. -  Unremarkable esophagus. Lungs/Pleura: Moderate severe emphysematous changes are again noted. There is persistent ground-glass opacification of the right lobe as before. There is slight interval worsening at the right lung base. There is mucous plugging and bronchial wall thickening primarily in the right lower lobe. There is no pneumothorax. No large pleural effusion. There is secretions within the upper trachea, otherwise the trachea is unremarkable. The previously demonstrated 1 cm pulmonary nodule in the right lower lobe appears to have essentially resolved. Upper Abdomen: Contrast bolus timing is not optimized for evaluation of the abdominal organs. The visualized portions of the organs of the upper abdomen are normal. Musculoskeletal: No chest wall abnormality. No bony spinal canal stenosis. Review of the MIP images confirms the above findings. IMPRESSION: 1. No evidence for acute pulmonary embolus. 2. Persistent findings of interstitial lung disease, not substantially changed from prior study. 3. New bronchial wall thickening and areas of consolidation  involving primarily the right lower lobe is suggestive of infectious or reactive bronchiolitis. Aortic Atherosclerosis (ICD10-I70.0) and Emphysema (ICD10-J43.9). Electronically Signed   By: Constance Holster M.D.   On: 09/05/2020 20:12    Cardiac Studies   2D echo 09/05/2020: 1. Left ventricular ejection fraction, by estimation, is 60 to 65%. The  left ventricle has normal function. The left ventricle has no regional  wall motion abnormalities with septal flattening consistent with RV  pressure and volume overload.  2. Right ventricular systolic function is moderately reduced. The right  ventricular size is severely enlarged. There is severely elevated  pulmonary artery systolic pressure. The estimated right ventricular  systolic pressure is 97.0 mmHg.  3. Right atrial size was severely dilated.  4. Mild mitral valve regurgitation.  5. Tricuspid valve regurgitation is moderate to severe. __________  2D echo 07/20/2020 ([place]): 1. Left ventricular ejection fraction, by estimation, is  60 to 65%. The  left ventricle has normal function. The left ventricle has no regional  wall motion abnormalities. There is mild concentric left ventricular  hypertrophy. Left ventricular diastolic  parameters are consistent with Grade I diastolic dysfunction (impaired  relaxation).  2. Right ventricular systolic function is normal. The right ventricular  size is normal.  3. Left atrial size was mildly dilated.  4. The mitral valve is normal in structure. Trivial mitral valve  regurgitation. No evidence of mitral stenosis. Severe mitral annular  calcification.  5. Tricuspid valve regurgitation is mild to moderate.  6. The aortic valve is calcified. Aortic valve regurgitation is not  visualized. Mild aortic valve sclerosis is present, with no evidence of  aortic valve stenosis.  7. The inferior vena cava is normal in size with greater than 50%  respiratory variability, suggesting  right atrial pressure of 3 mmHg.  Patient Profile     86 y.o. male with history of chronic hypoxic respiratory failure on supplemental oxygen via nasal cannula at 4 L secondary to ILD, pulmonary hypertension, COPD, diastolic dysfunction, CKD 2, diabetes, hypertension, hyperlipidemia, hypothyroidism, PVD s/p right femoral-popliteal U bypass in 08/2001 and aortobifemoral bypass in 2003, carotid artery disease s/p LICA stenting, BPH, OA, and being seen today for exacerbation of HFpEF  with pulmonary hypertension.   Assessment & Plan    Acute on chronic hypoxic respiratory failure secondary to ILD Severe Pulmonary hypertension, RV dysfunction COPD --Reports improvement in breathing.   --Wears 4L [place] at home.  Currently on 4.5 L nasal cannula oxygen. --Recommend wean back to baseline 4 L nasal cannula as tolerated. --Severe PHTN, suspected mixed etiology. Estimated pressure 76mmHg.  --Echo with enlarged RV cavity and PASP of 82. --Volume status improving on IV lasix 40mg  BID. --Output 2.1L yesterday with net -1.5L total.  --Wt 65.1kg  63.9kg. --Cr 1.20  1.20, BUN 26  28. Cr at his baseline. --Per MD recommendation yesterday 10/29, suspicion was that he could likely transition from IV to oral diuresis today.  He is relatively euvolemic on exam and he preload dependent; therefore, we could trial transition to oral diuresis today. However, given the increased output yesterday on same dose IV diuresis, suspect that he may need repeat CHF education to ensure he keeps total fluid under 2L and salt under 2g daily on oral diuresis and does not exacerbate his volume status. Will defer changes for now and discuss with rounding MD. --Will order repeat BNP. --Consider ReDS vest. --Replete electrolytes. Ordered additional potassium for repletion. --Reports improvement in leg cramping with potassium supplementation. --As an outpatient, could consider further work-up of elevated pulmonary pressures with  ultrasound of lower extremities or VQ scan.  Deferred for now. --Not an ideal RHC candidate, given his age and comorbids. Ideally, he would benefit from [place] given his pulmonary hypertension. --As previously noted, he is very deconditioned and recommend evaluation by PT and possible short-term rehab.   Hypokalemia --K 3.3. Replete with goal 4.0.   --Ordered additional K supplementation with KCl tab 45mEq x2 doses.   --Recheck BMET in AM. --Recommend daily Kcl supplementation on diuresis. --Ordered KCl tab 23mEq qd starting tomorrow 10/31 to take with lasix.  --Adjust KCl tab dose if adjust lasix dose to prevent electrolyte abnormalities.   HTN --BP soft.   --Close monitoring of vitals.  HLD --Continue statin and Zetia.  PAD/carotid artery disease --Continue clopidogrel.  Most recent ABIs 04/2020 and improved from prior.  Patent L ICA stent by noninvasive imaging 04/2020.  Hypothyroidism -TSH elevated on levothyroxine supplementation.  Defer to IM.  For questions or updates, please contact [place] Please consult [email or web address] for contact info under        Signed, Arvil Chaco, PA-C  09/07/2020, 9:13 AM

## 2020-09-07 NOTE — Progress Notes (Signed)
PROGRESS NOTE  Dylan Knight GMW:[id number] DOB: Apr 08, 1924 DOA: 09/03/2020 PCP: Tonia Ghent, MD  Brief History   Dylan Knight is a 86 y.o. male with medical history significant of known systolic CHF, diabetes, hyperlipidemia, hypertension, hypothyroidism, peripheral vascular disease, pulmonary fibrosis BPH, osteoarthritis, hyperlipidemia and COPD who has been having significant weight gain and fluid retention over the last couple of weeks.  Patient has had increased dose of his home Lasix but continues to be significantly fluid overloaded.  He has had anasarca.  Currently over 10 pounds above his usual weight.  Came to the ER with progressive shortness of breath and worsening lower extremity edema..  Denied any sick contact.  No fever or chills no significant cough.  Patient seen and evaluated and appears to have anasarca consistent with worsening CHF.  He is being admitted to the hospital for further evaluation and treatment.  Patient has been communicated with his primary care physician was outpatient treatment and adjustment of medications attempted but has not been successful so he has failed outpatient treatment..  ED Course: Temperature 98.4 blood pressure 143/80 pulse 83 respirate 20 oxygen sat 90% 2 L.  CBC sent chemistry mostly within normal except creatinine 1.43 and glucose 111.  Chest x-ray showed interstitial lung disease consistent with known interstitial pneumonia.  COVID-19 and viral screen currently pending. Admitted for further treatment.  The patient has been admitted to a telemetry bed. Diuresis had to be discontinued last night due to the patient's low blood pressures. Echocardiogram is pending.  The patient has been admitted to a telemetry bed. Wound care was consulted for the weeping in his legs and have recommended compression hose that fit appropriately.  Echocardiogram was obtained on 09/05/2020. It demonstrated LV EF of 55-60% with no regional motion abnormalities  with septal flattening consistent with RV pressure and volume overload. RV function is moderately reduced with severely enlarged RV. The PASP was severely elevated to 82.6 mmHg. Right atrial size is severely dilalted. There was mild mitral valve regurgitation. Tricuspid regurgitation is moderate to severe. This echocardiogram is substantially worse than that read in 07/2020.  CTA of the chest was negative for pulmonary embolus. It did demonstrated ground glass opacities and possible bronchiolitis. Will check lower extremity doppler to rule out DVT.  Cardiology has been consulted, and the heart failure team is involved. Attempts to diurese the patient have been complicated by hypotension. Right heart catheterization is being considered.  Bilateral lower extremity dopplers are negative for DVT.  Consultants  . Cardiology  Procedures  . None  Antibiotics   Anti-infectives (From admission, onward)   None     Subjective  The patient is resting comfortably. No new complaints.   Objective   Vitals:  Vitals:   09/07/20 0837 09/07/20 1153  BP: 106/64 (!) 93/42  Pulse: (!) 107 95  Resp: 16 17  Temp: 98.2 F (36.8 C) 97.6 F (36.4 C)  SpO2: 91% 94%   Exam:  Constitutional:  . The patient is awake, alert, and oriented x 3. No acute distress.  Respiratory:  . No increased work of breathing. . No wheezes or rhonchi . Positive for rales at bases. . No tactile fremitus Cardiovascular:  . Regular rate and rhythm . No murmurs, ectopy, or gallups. . No lateral PMI. No thrills. Abdomen:  . Abdomen is soft, non-tender, non-distended . No hernias, masses, or organomegaly . Normoactive bowel sounds.  Musculoskeletal:  . No cyanosis, clubbing, 1-2 + pitting edema of lower extremities bilaterally.  Skin:  . No rashes, lesions, ulcers . palpation of skin: no induration or nodules . Mild erythema to bilateral lower extremities, left worse than right.  . Wrinkling present demonstrating  success of diuresis. Neurologic:  . CN 2-12 intact . Sensation all 4 extremities intact Psychiatric:  . Mental status o Mood, affect appropriate o Orientation to person, place, time  . judgment and insight appear intact  I have personally reviewed the following:   Today's Data  . Vitals, BMP, CBC, BNP  Imaging  . CXR . Echocardiogram: Moderately decreased function of right ventricle with RV volume overload. Severe elevation of PASP. Moderate to severe tricusspid regurgitation. EF 55-60%. Much worse than in September 2021. Marland Kitchen CTA chest negative for PE. Positive for ground glass opacities and bronchiolitis.  . Bilateral lower extremity dopplers are negative for DVT  Scheduled Meds: . clopidogrel  75 mg Oral Daily  . enoxaparin (LOVENOX) injection  40 mg Subcutaneous Q2200  . ezetimibe  10 mg Oral Daily   And  . simvastatin  40 mg Oral Daily  . furosemide  40 mg Intravenous BID  . gabapentin  200 mg Oral TID  . guaiFENesin  600 mg Oral BID  . insulin aspart  0-5 Units Subcutaneous QHS  . insulin aspart  0-9 Units Subcutaneous TID WC  . levothyroxine  50 mcg Oral Q0600  . [START ON 09/08/2020] potassium chloride  20 mEq Oral BID  . potassium chloride  40 mEq Oral BID  . sodium chloride flush  3 mL Intravenous Q12H   Continuous Infusions: . sodium chloride      Principal Problem:   Acute congestive heart failure (HCC) Active Problems:   COPD (chronic obstructive pulmonary disease) (HCC)   Diabetes mellitus type 2, controlled ([place])   CAD (coronary artery disease)   Hypothyroidism   Hyperlipemia   Edema   Acute respiratory failure with hypoxia (HCC)   ILD (interstitial lung disease) ([place])   LOS: 4 days   A & P  Acute Right systolic failure: Echocardiogram in 07/2020 demonstrated normal RV function and size. Echocardiogram obtained this morning demonstrates a marked reduction in systolic function of the right ventricle, severe elevation of PASP, and . He has been  receiving Lasix to 40 mg twice daily IV as allowed by his low blood pressures.I have consulted Dr. Rockey Situ. CTA chest is negative for PE. Doppler of lower extremities has ruled out DVT. Continue diuresis as possible given hypotension. I appreciate the assistance of cardiology and the heart failure team. Consideration is given to possible Right Heart Catheterization to further investigate pressures.  COPD: No acute exacerbation.  Continue beta agonists.   DM II: Monitor glucoses with sliding scale insulin.  Peripheral neuropathy: Patient is complaining of pain in lower extremities bilaterally. He complains of the sensation of his skin stretching, but I also wonder about neuropathy. Will start low dose gabapentin.  Chronic venous stasis dermatitis:  Continue monitoring and diuresis. Compression hose should help when he is home.  Interstitial lung disease: Patient at baseline.  Continue oxygen at 4 L/min home regimen. Made worse by pulmonary edema due to CHF exacerbation.  Hypothyroidism: Continue with levothyroxine  Hyperlipidemia: Resume on continue home regimen  Coronary artery disease: Stable. Continue Plavix. Pt unable to tolerate continuation of the beta blocker due to hypotension.  I have seen and examined this patient myself. I have spent 32 minutes in his evaluation and care.  DVT prophylaxis: Lovenox Code Status: DNR Family Communication: None available. Disposition Plan:  Home Status is: Inpatient  Remains inpatient appropriate because:IV treatments appropriate due to intensity of illness or inability to take PO   Dispo: The patient is from: Home              Anticipated d/c is to: Home              Anticipated d/c date is: 3 days              Patient currently is not medically stable to d/c.  Severity of Illness: The appropriate patient status for this patient is INPATIENT. Inpatient status is judged to be reasonable and necessary in order to provide the required  intensity of service to ensure the patient's safety. The patient's presenting symptoms, physical exam findings, and initial radiographic and laboratory data in the context of their chronic comorbidities is felt to place them at high risk for further clinical deterioration. Furthermore, it is not anticipated that the patient will be medically stable for discharge from the hospital within 2 midnights of admission. The following factors support the patient status of inpatient.   "           The patient's presenting symptoms include lower extremities edema. "           The worrisome physical exam findings include chronic stasis dermatitis with lower extremity edema. "           The initial radiographic and laboratory data are worrisome because of evidence of interstitial lung disease. "           The chronic co-morbidities include CHF interstitial lung disease.   Shasha Buchbinder, DO [facility] Direct contact: see [email or web address]  7PM-7AM contact night coverage as above 09/07/2020, 4:13 PM  LOS: 1 day

## 2020-09-07 NOTE — Progress Notes (Signed)
Mobility Specialist - Progress Note   09/07/20 1200  Mobility  Activity Stood at bedside  Range of Motion/Exercises Right leg;Left leg (ankle pumps, hip isometrics, seated march, slr)  Level of Assistance Minimal assist, patient does 75% or more  Assistive Device Front wheel walker (Bed Rails)  Distance Ambulated (ft) 0 ft  Mobility Response Tolerated well  Mobility performed by Mobility specialist  $Mobility charge 1 Mobility    Pre-mobility: 91 HR, 96% SpO2 During mobility: 103 HR, 87% SpO2 Post-mobility: 94 HR, 93% SpO2   Pt was lying in bed upon arrival utilizing 4L [place] O2. Pt agreed to session. Pt was able to get EOB with minA. Pt denied dizziness or nausea upon sitting. Pt stood to RW with minA, where he began c/o "pain and weakness" in LE. Pt declined further OOB activity and requested to sit back down. Pt performed seated exercises: ankle pumps, hip isometrics, seated march, and straight leg raises. Pt utilized bed rails for balance and UE support while EOB. Pt c/o cramps in thighs and slight SOB. O2 desat to 87%, but with no heavy breathing noted. Mobility increased O2 to 5L d/t prolonged wait for sats to increase. Pt returned EOB-supine with minA-modA for repositioning. Overall, pt tolerated session well. Pt was left in bed with alarm set and all needs in reach.     Kathee Delton Mobility Specialist 09/07/20, 12:43 PM

## 2020-09-08 DIAGNOSIS — J9601 Acute respiratory failure with hypoxia: Secondary | ICD-10-CM

## 2020-09-08 DIAGNOSIS — I5031 Acute diastolic (congestive) heart failure: Secondary | ICD-10-CM | POA: Diagnosis not present

## 2020-09-08 DIAGNOSIS — R609 Edema, unspecified: Secondary | ICD-10-CM | POA: Diagnosis not present

## 2020-09-08 DIAGNOSIS — I2510 Atherosclerotic heart disease of native coronary artery without angina pectoris: Secondary | ICD-10-CM | POA: Diagnosis not present

## 2020-09-08 LAB — CBC WITH DIFFERENTIAL/PLATELET
Abs Immature Granulocytes: 0.04 10*3/uL (ref 0.00–0.07)
Basophils Absolute: 0 10*3/uL (ref 0.0–0.1)
Basophils Relative: 0 %
Eosinophils Absolute: 0.2 10*3/uL (ref 0.0–0.5)
Eosinophils Relative: 3 %
HCT: 43.9 % (ref 39.0–52.0)
Hemoglobin: 15 g/dL (ref 13.0–17.0)
Immature Granulocytes: 1 %
Lymphocytes Relative: 11 %
Lymphs Abs: 0.7 10*3/uL (ref 0.7–4.0)
MCH: 34.9 pg — ABNORMAL HIGH (ref 26.0–34.0)
MCHC: 34.2 g/dL (ref 30.0–36.0)
MCV: 102.1 fL — ABNORMAL HIGH (ref 80.0–100.0)
Monocytes Absolute: 0.7 10*3/uL (ref 0.1–1.0)
Monocytes Relative: 10 %
Neutro Abs: 5 10*3/uL (ref 1.7–7.7)
Neutrophils Relative %: 75 %
Platelets: 158 10*3/uL (ref 150–400)
RBC: 4.3 MIL/uL (ref 4.22–5.81)
RDW: 14 % (ref 11.5–15.5)
WBC: 6.7 10*3/uL (ref 4.0–10.5)
nRBC: 0 % (ref 0.0–0.2)

## 2020-09-08 LAB — GLUCOSE, CAPILLARY
Glucose-Capillary: 98 mg/dL (ref 70–99)
Glucose-Capillary: 189 mg/dL — ABNORMAL HIGH (ref 70–99)
Glucose-Capillary: 122 mg/dL — ABNORMAL HIGH (ref 70–99)
Glucose-Capillary: 184 mg/dL — ABNORMAL HIGH (ref 70–99)

## 2020-09-08 LAB — BASIC METABOLIC PANEL
Anion gap: 8 (ref 5–15)
BUN: 27 mg/dL — ABNORMAL HIGH (ref 8–23)
CO2: 27 mmol/L (ref 22–32)
Calcium: 8.8 mg/dL — ABNORMAL LOW (ref 8.9–10.3)
Chloride: 98 mmol/L (ref 98–111)
Creatinine, Ser: 1.1 mg/dL (ref 0.61–1.24)
GFR, Estimated: >60 mL/min (ref >60)
Glucose, Bld: 155 mg/dL — ABNORMAL HIGH (ref 70–99)
Potassium: 4.5 mmol/L (ref 3.5–5.1)
Sodium: 133 mmol/L — ABNORMAL LOW (ref 135–145)

## 2020-09-08 NOTE — Progress Notes (Signed)
PROGRESS NOTE  Dylan Knight DJT:[id number] DOB: 1924-11-01 DOA: 09/03/2020 PCP: Tonia Ghent, MD  Brief History   Dylan Knight is a 86 y.o. male with medical history significant of known systolic CHF, diabetes, hyperlipidemia, hypertension, hypothyroidism, peripheral vascular disease, pulmonary fibrosis BPH, osteoarthritis, hyperlipidemia and COPD who has been having significant weight gain and fluid retention over the last couple of weeks.  Patient has had increased dose of his home Lasix but continues to be significantly fluid overloaded.  He has had anasarca.  Currently over 10 pounds above his usual weight.  Came to the ER with progressive shortness of breath and worsening lower extremity edema..  Denied any sick contact.  No fever or chills no significant cough.  Patient seen and evaluated and appears to have anasarca consistent with worsening CHF.  He is being admitted to the hospital for further evaluation and treatment.  Patient has been communicated with his primary care physician was outpatient treatment and adjustment of medications attempted but has not been successful so he has failed outpatient treatment..  ED Course: Temperature 98.4 blood pressure 143/80 pulse 83 respirate 20 oxygen sat 90% 2 L.  CBC sent chemistry mostly within normal except creatinine 1.43 and glucose 111.  Chest x-ray showed interstitial lung disease consistent with known interstitial pneumonia.  COVID-19 and viral screen currently pending. Admitted for further treatment.  The patient has been admitted to a telemetry bed. Diuresis had to be discontinued last night due to the patient's low blood pressures. Echocardiogram is pending.  The patient has been admitted to a telemetry bed. Wound care was consulted for the weeping in his legs and have recommended compression hose that fit appropriately.  Echocardiogram was obtained on 09/05/2020. It demonstrated LV EF of 55-60% with no regional motion abnormalities  with septal flattening consistent with RV pressure and volume overload. RV function is moderately reduced with severely enlarged RV. The PASP was severely elevated to 82.6 mmHg. Right atrial size is severely dilalted. There was mild mitral valve regurgitation. Tricuspid regurgitation is moderate to severe. This echocardiogram is substantially worse than that read in 07/2020.  CTA of the chest was negative for pulmonary embolus. It did demonstrated ground glass opacities and possible bronchiolitis. Will check lower extremity doppler to rule out DVT.  Cardiology has been consulted, and the heart failure team is involved. Attempts to diurese the patient have been complicated by hypotension. Right heart catheterization is being considered.  Bilateral lower extremity dopplers are negative for DVT. Lower extremity edema is resolving.   Consultants  . Cardiology  Procedures  . None  Antibiotics   Anti-infectives (From admission, onward)   None     Subjective  The patient is resting comfortably. No new complaints.   Objective   Vitals:  Vitals:   09/08/20 0344 09/08/20 1156  BP: (!) 144/94 127/67  Pulse: (!) 110 100  Resp: 20 20  Temp: (!) 97.5 F (36.4 C)   SpO2: 96% 90%   Exam:  Constitutional:  . The patient is awake, alert, and oriented x 3. No acute distress.  Respiratory:  . No increased work of breathing. . No wheezes or rhonchi . Positive for rales at bases. . No tactile fremitus Cardiovascular:  . Regular rate and rhythm . No murmurs, ectopy, or gallups. . No lateral PMI. No thrills. Abdomen:  . Abdomen is soft, non-tender, non-distended . No hernias, masses, or organomegaly . Normoactive bowel sounds.  Musculoskeletal:  . No cyanosis, clubbing, or edema. Skin:  .  No rashes, lesions, ulcers . palpation of skin: no induration or nodules . Mild erythema to bilateral lower extremities, left worse than right.  . Wrinkling present demonstrating success of  diuresis. Neurologic:  . CN 2-12 intact . Sensation all 4 extremities intact Psychiatric:  . Mental status o Mood, affect appropriate o Orientation to person, place, time  . judgment and insight appear intact  I have personally reviewed the following:   Today's Data  . Vitals, BMP, CBC  Imaging  . CXR . Echocardiogram: Moderately decreased function of right ventricle with RV volume overload. Severe elevation of PASP. Moderate to severe tricusspid regurgitation. EF 55-60%. Much worse than in September 2021. Marland Kitchen CTA chest negative for PE. Positive for ground glass opacities and bronchiolitis.  . Bilateral lower extremity dopplers are negative for DVT  Scheduled Meds: . clopidogrel  75 mg Oral Daily  . enoxaparin (LOVENOX) injection  40 mg Subcutaneous Q2200  . ezetimibe  10 mg Oral Daily   And  . simvastatin  40 mg Oral Daily  . furosemide  40 mg Intravenous BID  . gabapentin  200 mg Oral TID  . guaiFENesin  600 mg Oral BID  . insulin aspart  0-5 Units Subcutaneous QHS  . insulin aspart  0-9 Units Subcutaneous TID WC  . levothyroxine  50 mcg Oral Q0600  . potassium chloride  20 mEq Oral BID  . sodium chloride flush  3 mL Intravenous Q12H   Continuous Infusions: . sodium chloride      Principal Problem:   Acute congestive heart failure (HCC) Active Problems:   COPD (chronic obstructive pulmonary disease) (HCC)   Diabetes mellitus type 2, controlled (HCC)   CAD (coronary artery disease)   Hypothyroidism   Hyperlipemia   Edema   Acute respiratory failure with hypoxia (HCC)   ILD (interstitial lung disease) ([place])   LOS: 5 days   A & P  Acute Right systolic failure: Echocardiogram in 07/2020 demonstrated normal RV function and size. Echocardiogram obtained this morning demonstrates a marked reduction in systolic function of the right ventricle, severe elevation of PASP, and . He has been receiving Lasix to 40 mg twice daily IV as allowed by his low blood pressures.I have  consulted Dr. Rockey Situ. CTA chest is negative for PE. Doppler of lower extremities has ruled out DVT. Continue diuresis as possible given hypotension. I appreciate the assistance of cardiology and the heart failure team. Consideration is given to possible Right Heart Catheterization to further investigate pressures.  COPD: No acute exacerbation.  Continue beta agonists.   DM II: Monitor glucoses with sliding scale insulin.  Peripheral neuropathy: Patient is complaining of pain in lower extremities bilaterally. He complains of the sensation of his skin stretching, but I also wonder about neuropathy. Will start low dose gabapentin.  Chronic venous stasis dermatitis:  Continue monitoring and diuresis. Compression hose should help when he is home.  Interstitial lung disease: Patient at baseline.  Continue oxygen at 4 L/min home regimen. Made worse by pulmonary edema due to CHF exacerbation.  Hypothyroidism: Continue with levothyroxine  Hyperlipidemia: Resume on continue home regimen  Coronary artery disease: Stable. Continue Plavix. Pt unable to tolerate continuation of the beta blocker due to hypotension.  I have seen and examined this patient myself. I have spent 30 minutes in his evaluation and care.  DVT prophylaxis: Lovenox Code Status: DNR Family Communication: None available. Disposition Plan: Home Status is: Inpatient  Remains inpatient appropriate because:IV treatments appropriate due to intensity of illness  or inability to take PO   Dispo: The patient is from: Home              Anticipated d/c is to: Home              Anticipated d/c date is: 1-2 days              Patient currently is not medically stable to d/c.  Severity of Illness: The appropriate patient status for this patient is INPATIENT. Inpatient status is judged to be reasonable and necessary in order to provide the required intensity of service to ensure the patient's safety. The patient's presenting  symptoms, physical exam findings, and initial radiographic and laboratory data in the context of their chronic comorbidities is felt to place them at high risk for further clinical deterioration. Furthermore, it is not anticipated that the patient will be medically stable for discharge from the hospital within 2 midnights of admission. The following factors support the patient status of inpatient.   "           The patient's presenting symptoms include lower extremities edema. "           The worrisome physical exam findings include chronic stasis dermatitis with lower extremity edema. "           The initial radiographic and laboratory data are worrisome because of evidence of interstitial lung disease. "           The chronic co-morbidities include CHF interstitial lung disease.   Dylan Champine, DO [facility] Direct contact: see [email or web address]  7PM-7AM contact night coverage as above 09/08/2020, 1:05 PM  LOS: 1 day

## 2020-09-08 NOTE — Progress Notes (Signed)
Mobility Specialist - Progress Note   09/08/20 1100  Mobility  Activity Stood at bedside  Range of Motion/Exercises Right leg;Left leg (straight leg raises, seated march, standing march)  Level of Assistance Minimal assist, patient does 75% or more  Information systems manager Ambulated (ft) 0 ft  Mobility Response Tolerated well  Mobility performed by Mobility specialist  $Mobility charge 1 Mobility    Pre-mobility: 105 HR, 95% SpO2 During mobility: 125 HR, 85% SpO2 Post-mobility: 107 HR, 93% SpO2   Pt was lying in bed upon arrival utilizing 4L [place] O2. Pt agreed to session. Pt c/o pain LE, rating it a 3/10 currently. Pt was able to get EOB with minA for LE support. Pt requires some assistance to maintain balance and posture while EOB, bed rails were utilized. Pt performed seated exercises: straight leg raises (15x/leg) and seated march (10 secs). Pt was hesitant for OOB activity d/t LE weakness, but with encouragement from mobility, pt progressed to standing with RW. Upon standing, pt also was able to march in place for 20 secs. CGA utilized for safety as pt presents with anterior lean. VC were used to correct upright posture. Pt c/o slight SOB, O2 desat to 85%. Mobility increased oxygen to 4.5L d/t prolonged wait for O2 to increase. After ~2 mins, pt's O2 sat up to mid 90s and was taken back down to 4L with no change in sats. Overall, pt tolerated session well. Pt was left in bed (+1 for bed mobility) with all needs in reach and alarm set.    Kathee Delton Mobility Specialist 09/08/20, 12:01 PM

## 2020-09-08 NOTE — Progress Notes (Signed)
Progress Note  Patient Name: Dylan Knight Date of Encounter: 09/08/2020  Primary Cardiologist: [facility], Dr. Rockey Situ  Subjective   Feeling OK this AM. Teddy Spike. Ate breakfast sitting on the side of the bed. Tells me he is "pleased" with how his legs look with less fluid on board.  Inpatient Medications    Scheduled Meds: . clopidogrel  75 mg Oral Daily  . enoxaparin (LOVENOX) injection  40 mg Subcutaneous Q2200  . ezetimibe  10 mg Oral Daily   And  . simvastatin  40 mg Oral Daily  . furosemide  40 mg Intravenous BID  . gabapentin  200 mg Oral TID  . guaiFENesin  600 mg Oral BID  . insulin aspart  0-5 Units Subcutaneous QHS  . insulin aspart  0-9 Units Subcutaneous TID WC  . levothyroxine  50 mcg Oral Q0600  . potassium chloride  20 mEq Oral BID  . sodium chloride flush  3 mL Intravenous Q12H   Continuous Infusions: . sodium chloride     PRN Meds: sodium chloride, acetaminophen, albuterol, HYDROcodone-acetaminophen, ondansetron (ZOFRAN) IV, sodium chloride flush   Vital Signs    Vitals:   09/07/20 1928 09/07/20 2306 09/08/20 0344 09/08/20 0500  BP: 108/73 127/69 (!) 144/94   Pulse: 94 90 (!) 110   Resp: 20 16 20    Temp: (!) 97.5 F (36.4 C) (!) 97.5 F (36.4 C) (!) 97.5 F (36.4 C)   TempSrc: Oral Oral Oral   SpO2: 99% 98% 96%   Weight:    64.5 kg  Height:        Intake/Output Summary (Last 24 hours) at 09/08/2020 1022 Last data filed at 09/08/2020 0200 Gross per 24 hour  Intake 930 ml  Output 800 ml  Net 130 ml   Last 3 Weights 09/08/2020 09/07/2020 09/06/2020  Weight (lbs) 142 lb 3.2 oz 140 lb 14 oz 143 lb 8.3 oz  Weight (kg) 64.5 kg 63.9 kg 65.1 kg      Telemetry    NSR - Personally Reviewed  ECG    No new tracings- Personally Reviewed  Physical Exam   GEN: No acute distress.  Neck:  No appreciate JVD at 45 degrees. Cardiac: RRR, 2/6 systolic murmur best appreciated LLSB.  No rubs or gallops.  Respiratory:  Good aeration at bases.  GI:  Soft, nontender, non-distended  MS:  Nosignificant lower extremity edema.  Bandages noted on both of his anterior lower extremities and placed over his tibia; No deformity. Neuro:  Nonfocal  Psych: Normal affect   Labs    High Sensitivity Troponin:   Recent Labs  Lab 09/03/20 1708 09/03/20 2225  TROPONINIHS 14 17      Cardiac EnzymesNo results for input(s): TROPONINI in the last 168 hours. No results for input(s): TROPIPOC in the last 168 hours.   Chemistry Recent Labs  Lab 09/06/20 0443 09/07/20 0521 09/08/20 0338  NA 134* 136 133*  K 3.3* 3.3* 4.5  CL 95* 99 98  CO2 27 29 27   GLUCOSE 188* 153* 155*  BUN 26* 28* 27*  CREATININE 1.20 1.20 1.10  CALCIUM 8.9 8.7* 8.8*  GFRNONAA 56* 56* >60  ANIONGAP 12 8 8      Hematology Recent Labs  Lab 09/04/20 1150 09/06/20 0443 09/08/20 0338  WBC 8.9 5.5 6.7  RBC 4.47 4.23 4.30  HGB 15.7 15.1 15.0  HCT 45.6 41.9 43.9  MCV 102.0* 99.1 102.1*  MCH 35.1* 35.7* 34.9*  MCHC 34.4 36.0 34.2  RDW 14.7  14.0 14.0  PLT 221 176 158    BNP Recent Labs  Lab 09/03/20 1708 09/06/20 1436 09/07/20 0610  BNP 1,496.3* 1,007.5* 479.4*     DDimer No results for input(s): DDIMER in the last 168 hours.   Radiology    US Venous Img Lower Bilateral (DVT)  Result Date: 09/07/2020 CLINICAL DATA:  86 year old with lower extremity edema. EXAM: BILATERAL LOWER EXTREMITY VENOUS DOPPLER ULTRASOUND TECHNIQUE: Gray-scale sonography with graded compression, as well as color Doppler and duplex ultrasound were performed to evaluate the lower extremity deep venous systems from the level of the common femoral vein and including the common femoral, femoral, profunda femoral, popliteal and calf veins including the posterior tibial, peroneal and gastrocnemius veins when visible. The superficial great saphenous vein was also interrogated. Spectral Doppler was utilized to evaluate flow at rest and with distal augmentation maneuvers in the common femoral,  femoral and popliteal veins. COMPARISON:  05/06/2017 FINDINGS: RIGHT LOWER EXTREMITY Common Femoral Vein: No evidence of thrombus. Normal compressibility, respiratory phasicity and response to augmentation. Saphenofemoral Junction: No evidence of thrombus. Normal compressibility and flow on color Doppler imaging. Profunda Femoral Vein: No evidence of thrombus. Normal compressibility and flow on color Doppler imaging. Femoral Vein: No evidence of thrombus. Normal compressibility, respiratory phasicity and response to augmentation. Popliteal Vein: No evidence of thrombus. Normal compressibility, respiratory phasicity and response to augmentation. Calf Veins: No evidence of thrombus. Normal compressibility and flow on color Doppler imaging. Other Findings:  None. LEFT LOWER EXTREMITY Common Femoral Vein: No evidence of thrombus. Normal compressibility, respiratory phasicity and response to augmentation. Saphenofemoral Junction: No evidence of thrombus. Normal compressibility and flow on color Doppler imaging. Profunda Femoral Vein: No evidence of thrombus. Normal compressibility and flow on color Doppler imaging. Femoral Vein: No evidence of thrombus. Normal compressibility, respiratory phasicity and response to augmentation. Popliteal Vein: No evidence of thrombus. Normal compressibility, respiratory phasicity and response to augmentation. Calf Veins: No evidence of thrombus. Normal compressibility and flow on color Doppler imaging. Other Findings:  None. IMPRESSION: No evidence of deep venous thrombosis in either lower extremity. Electronically Signed   By: Markus Daft M.D.   On: 09/07/2020 13:04    Cardiac Studies   2D echo 09/05/2020: 1. Left ventricular ejection fraction, by estimation, is 60 to 65%. The  left ventricle has normal function. The left ventricle has no regional  wall motion abnormalities with septal flattening consistent with RV  pressure and volume overload.  2. Right ventricular systolic  function is moderately reduced. The right  ventricular size is severely enlarged. There is severely elevated  pulmonary artery systolic pressure. The estimated right ventricular  systolic pressure is 76.8 mmHg.  3. Right atrial size was severely dilated.  4. Mild mitral valve regurgitation.  5. Tricuspid valve regurgitation is moderate to severe. __________  2D echo 07/20/2020 ([place]): 1. Left ventricular ejection fraction, by estimation, is 60 to 65%. The  left ventricle has normal function. The left ventricle has no regional  wall motion abnormalities. There is mild concentric left ventricular  hypertrophy. Left ventricular diastolic  parameters are consistent with Grade I diastolic dysfunction (impaired  relaxation).  2. Right ventricular systolic function is normal. The right ventricular  size is normal.  3. Left atrial size was mildly dilated.  4. The mitral valve is normal in structure. Trivial mitral valve  regurgitation. No evidence of mitral stenosis. Severe mitral annular  calcification.  5. Tricuspid valve regurgitation is mild to moderate.  6. The aortic valve  is calcified. Aortic valve regurgitation is not  visualized. Mild aortic valve sclerosis is present, with no evidence of  aortic valve stenosis.  7. The inferior vena cava is normal in size with greater than 50%  respiratory variability, suggesting right atrial pressure of 3 mmHg.  Patient Profile     86 y.o. male with history of chronic hypoxic respiratory failure on supplemental oxygen via nasal cannula at 4 L secondary to ILD, pulmonary hypertension, COPD, diastolic dysfunction, CKD 2, diabetes, hypertension, hyperlipidemia, hypothyroidism, PVD s/p right femoral-popliteal U bypass in 08/2001 and aortobifemoral bypass in 2003, carotid artery disease s/p LICA stenting, BPH, OA, and being seen today for exacerbation of HFpEF  with pulmonary hypertension.   Assessment & Plan    1. Acute on  chronic diastolic heart failure and pulmonary HTN - volume status improving - recommend we continue IV diuresis today and then transition to oral torsemide 20mg  by mouth once daily starting Monday 09/09/2020.   2. Chronic hypoxia - cont home o2  3. HLD - cont statin/zetia  4. PAD/Carotid disease  5. Hypothyroidism Cont levothyroxine  6. Deconditioning - planning to discharge to rehab once volume status is optimized.    For questions or updates, please contact [place] Please consult [email or web address] for contact info under       Signed, Vickie Epley, MD  09/08/2020, 10:22 AM

## 2020-09-08 NOTE — TOC Progression Note (Signed)
Transition of Care [facility]) - Progression Note    Patient Details  Name: Dylan Knight MRN: [id number] Date of Birth: 12-Mar-1924  [facility] CM/SW Contact  Meriel Flavors, LCSW Phone Number: 09/08/2020, 8:23 AM  Clinical Narrative:    Patient has insurance auth through 11/02. Per doctor's note, he may be here beyond 11/02 and need re-auth.   Expected Discharge Plan: [place] Barriers to Discharge: Continued Medical Work up  Expected Discharge Plan and Services Expected Discharge Plan: [place]   Discharge Planning Services: CM Consult Post Acute Care Choice: [place] Living arrangements for the past 2 months: Apartment                                       Social Determinants of Health (SDOH) Interventions    Readmission Risk Interventions Readmission Risk Prevention Plan 09/04/2020  Transportation Screening Complete  PCP or Specialist Appt within 5-7 Days Complete  Home Care Screening Complete  Medication Review (RN CM) Referral to Pharmacy  Some recent data might be hidden

## 2020-09-08 NOTE — Progress Notes (Signed)
Physical Therapy Treatment Patient Details Name: Dylan Knight MRN: [id number] DOB: 1924-10-16 Today's Date: 09/08/2020    History of Present Illness 89yoM here with b/l LE swelling and CHF exacerbation.  He  was admitted 9/10 w/ SOB. PMH: CAD, OA, BPH, CAD, DM, HTN, HLD, hypoTSH, PVD, remote CVA c balance impairment. Pt recently treated for CAP.    PT Comments    Pt was pleasant and motivated to participate during the session and put forth good effort throughout.  Pt was limited by functional weakness and deficits in activity tolerance, however, and required physical assistance with all functional tasks.  Pt was only able to amb room distances this session with short, shuffling steps and min A for stability but with no adverse symptoms reported other than general fatigue.  Pt's SpO2 and HR were WNL during the session on 4LO2/min.  Pt will benefit from PT services in a SNF setting upon discharge to safely address deficits listed in patient problem list for decreased caregiver assistance and eventual return to PLOF.     Follow Up Recommendations  SNF     Equipment Recommendations  None recommended by PT    Recommendations for Other Services       Precautions / Restrictions Precautions Precautions: Fall Restrictions Weight Bearing Restrictions: No    Mobility  Bed Mobility Overal bed mobility: Needs Assistance Bed Mobility: Supine to Sit;Sit to Supine     Supine to sit: Min assist Sit to supine: Min assist   General bed mobility comments: Min A for BLE and trunk control  Transfers Overall transfer level: Needs assistance Equipment used: Rolling walker (2 wheeled) Transfers: Sit to/from Stand Sit to Stand: Min assist;From elevated surface         General transfer comment: Min A to come to full upright standing and for stability upon initial stand  Ambulation/Gait Ambulation/Gait assistance: Min assist Gait Distance (Feet): 12 Feet x 2 Assistive device: Rolling  walker (2 wheeled) Gait Pattern/deviations: Step-through pattern;Decreased step length - right;Decreased step length - left;Shuffle;Trunk flexed;Narrow base of support Gait velocity: decreased   General Gait Details: Pt only able to amb room distances this session with short, shuffling steps and min A for stability   Stairs             Wheelchair Mobility    Modified Rankin (Stroke Patients Only)       Balance Overall balance assessment: Needs assistance Sitting-balance support: Bilateral upper extremity supported Sitting balance-Leahy Scale: Good     Standing balance support: Bilateral upper extremity supported Standing balance-Leahy Scale: Poor Standing balance comment: Min A for stability during amb                            Cognition Arousal/Alertness: Awake/alert Behavior During Therapy: WFL for tasks assessed/performed Overall Cognitive Status: Within Functional Limits for tasks assessed                                        Exercises Other Exercises Other Exercises: Bed mobility training with cues for sequencing    General Comments        Pertinent Vitals/Pain Pain Assessment: No/denies pain    Home Living                      Prior Function  PT Goals (current goals can now be found in the care plan section) Progress towards PT goals: Progressing toward goals    Frequency    Min 2X/week      PT Plan Current plan remains appropriate    Co-evaluation              AM-PAC PT "6 Clicks" Mobility   Outcome Measure  Help needed turning from your back to your side while in a flat bed without using bedrails?: A Little Help needed moving from lying on your back to sitting on the side of a flat bed without using bedrails?: A Little Help needed moving to and from a bed to a chair (including a wheelchair)?: A Little Help needed standing up from a chair using your arms (e.g., wheelchair or  bedside chair)?: A Little Help needed to walk in hospital room?: A Little Help needed climbing 3-5 steps with a railing? : A Lot 6 Click Score: 17    End of Session Equipment Utilized During Treatment: Gait belt;Oxygen Activity Tolerance: Patient limited by fatigue Patient left: in bed;with bed alarm set;with call bell/phone within reach Nurse Communication: Mobility status PT Visit Diagnosis: Muscle weakness (generalized) (M62.81);Difficulty in walking, not elsewhere classified (R26.2);Pain Pain - Right/Left: Right Pain - part of body: Leg     Time: 1350-1419 PT Time Calculation (min) (ACUTE ONLY): 29 min  Charges:  $Gait Training: 8-22 mins $Therapeutic Activity: 8-22 mins                     D. Scott Nickoles Gregori PT, DPT 09/08/20, 2:41 PM

## 2020-09-09 DIAGNOSIS — R0689 Other abnormalities of breathing: Secondary | ICD-10-CM | POA: Diagnosis not present

## 2020-09-09 DIAGNOSIS — E119 Type 2 diabetes mellitus without complications: Secondary | ICD-10-CM | POA: Diagnosis not present

## 2020-09-09 DIAGNOSIS — Z8673 Personal history of transient ischemic attack (TIA), and cerebral infarction without residual deficits: Secondary | ICD-10-CM | POA: Diagnosis not present

## 2020-09-09 DIAGNOSIS — R252 Cramp and spasm: Secondary | ICD-10-CM | POA: Diagnosis present

## 2020-09-09 DIAGNOSIS — Z9981 Dependence on supplemental oxygen: Secondary | ICD-10-CM | POA: Diagnosis not present

## 2020-09-09 DIAGNOSIS — J449 Chronic obstructive pulmonary disease, unspecified: Secondary | ICD-10-CM | POA: Diagnosis not present

## 2020-09-09 DIAGNOSIS — I5020 Unspecified systolic (congestive) heart failure: Secondary | ICD-10-CM | POA: Diagnosis not present

## 2020-09-09 DIAGNOSIS — I110 Hypertensive heart disease with heart failure: Secondary | ICD-10-CM | POA: Diagnosis not present

## 2020-09-09 DIAGNOSIS — I10 Essential (primary) hypertension: Secondary | ICD-10-CM | POA: Diagnosis not present

## 2020-09-09 DIAGNOSIS — R410 Disorientation, unspecified: Secondary | ICD-10-CM | POA: Diagnosis present

## 2020-09-09 DIAGNOSIS — I2510 Atherosclerotic heart disease of native coronary artery without angina pectoris: Secondary | ICD-10-CM | POA: Diagnosis present

## 2020-09-09 DIAGNOSIS — E785 Hyperlipidemia, unspecified: Secondary | ICD-10-CM | POA: Diagnosis present

## 2020-09-09 DIAGNOSIS — Z66 Do not resuscitate: Secondary | ICD-10-CM | POA: Diagnosis present

## 2020-09-09 DIAGNOSIS — E1151 Type 2 diabetes mellitus with diabetic peripheral angiopathy without gangrene: Secondary | ICD-10-CM | POA: Diagnosis present

## 2020-09-09 DIAGNOSIS — I130 Hypertensive heart and chronic kidney disease with heart failure and stage 1 through stage 4 chronic kidney disease, or unspecified chronic kidney disease: Secondary | ICD-10-CM | POA: Diagnosis present

## 2020-09-09 DIAGNOSIS — Z7984 Long term (current) use of oral hypoglycemic drugs: Secondary | ICD-10-CM | POA: Diagnosis not present

## 2020-09-09 DIAGNOSIS — J9611 Chronic respiratory failure with hypoxia: Secondary | ICD-10-CM | POA: Diagnosis not present

## 2020-09-09 DIAGNOSIS — H3530 Unspecified macular degeneration: Secondary | ICD-10-CM | POA: Diagnosis present

## 2020-09-09 DIAGNOSIS — I5043 Acute on chronic combined systolic (congestive) and diastolic (congestive) heart failure: Secondary | ICD-10-CM | POA: Diagnosis not present

## 2020-09-09 DIAGNOSIS — J9621 Acute and chronic respiratory failure with hypoxia: Secondary | ICD-10-CM | POA: Diagnosis present

## 2020-09-09 DIAGNOSIS — I2721 Secondary pulmonary arterial hypertension: Secondary | ICD-10-CM | POA: Diagnosis not present

## 2020-09-09 DIAGNOSIS — J432 Centrilobular emphysema: Secondary | ICD-10-CM | POA: Diagnosis not present

## 2020-09-09 DIAGNOSIS — E1159 Type 2 diabetes mellitus with other circulatory complications: Secondary | ICD-10-CM | POA: Diagnosis not present

## 2020-09-09 DIAGNOSIS — I5031 Acute diastolic (congestive) heart failure: Secondary | ICD-10-CM | POA: Diagnosis not present

## 2020-09-09 DIAGNOSIS — R609 Edema, unspecified: Secondary | ICD-10-CM | POA: Diagnosis not present

## 2020-09-09 DIAGNOSIS — I25118 Atherosclerotic heart disease of native coronary artery with other forms of angina pectoris: Secondary | ICD-10-CM | POA: Diagnosis not present

## 2020-09-09 DIAGNOSIS — R627 Adult failure to thrive: Secondary | ICD-10-CM | POA: Diagnosis present

## 2020-09-09 DIAGNOSIS — J441 Chronic obstructive pulmonary disease with (acute) exacerbation: Secondary | ICD-10-CM | POA: Diagnosis present

## 2020-09-09 DIAGNOSIS — M2550 Pain in unspecified joint: Secondary | ICD-10-CM | POA: Diagnosis not present

## 2020-09-09 DIAGNOSIS — Z20822 Contact with and (suspected) exposure to covid-19: Secondary | ICD-10-CM | POA: Diagnosis present

## 2020-09-09 DIAGNOSIS — Z515 Encounter for palliative care: Secondary | ICD-10-CM | POA: Diagnosis not present

## 2020-09-09 DIAGNOSIS — M545 Low back pain, unspecified: Secondary | ICD-10-CM | POA: Diagnosis not present

## 2020-09-09 DIAGNOSIS — R059 Cough, unspecified: Secondary | ICD-10-CM | POA: Diagnosis not present

## 2020-09-09 DIAGNOSIS — E039 Hypothyroidism, unspecified: Secondary | ICD-10-CM | POA: Diagnosis present

## 2020-09-09 DIAGNOSIS — M1612 Unilateral primary osteoarthritis, left hip: Secondary | ICD-10-CM | POA: Diagnosis present

## 2020-09-09 DIAGNOSIS — J8410 Pulmonary fibrosis, unspecified: Secondary | ICD-10-CM | POA: Diagnosis present

## 2020-09-09 DIAGNOSIS — J849 Interstitial pulmonary disease, unspecified: Secondary | ICD-10-CM | POA: Diagnosis not present

## 2020-09-09 DIAGNOSIS — R069 Unspecified abnormalities of breathing: Secondary | ICD-10-CM | POA: Diagnosis not present

## 2020-09-09 DIAGNOSIS — Z7401 Bed confinement status: Secondary | ICD-10-CM | POA: Diagnosis not present

## 2020-09-09 DIAGNOSIS — G9340 Encephalopathy, unspecified: Secondary | ICD-10-CM | POA: Diagnosis not present

## 2020-09-09 DIAGNOSIS — N400 Enlarged prostate without lower urinary tract symptoms: Secondary | ICD-10-CM | POA: Diagnosis present

## 2020-09-09 DIAGNOSIS — G9341 Metabolic encephalopathy: Secondary | ICD-10-CM | POA: Diagnosis present

## 2020-09-09 DIAGNOSIS — E860 Dehydration: Secondary | ICD-10-CM | POA: Diagnosis present

## 2020-09-09 DIAGNOSIS — R102 Pelvic and perineal pain: Secondary | ICD-10-CM | POA: Diagnosis not present

## 2020-09-09 DIAGNOSIS — R52 Pain, unspecified: Secondary | ICD-10-CM | POA: Diagnosis not present

## 2020-09-09 DIAGNOSIS — J9601 Acute respiratory failure with hypoxia: Secondary | ICD-10-CM | POA: Diagnosis not present

## 2020-09-09 DIAGNOSIS — N1831 Chronic kidney disease, stage 3a: Secondary | ICD-10-CM | POA: Diagnosis present

## 2020-09-09 DIAGNOSIS — I517 Cardiomegaly: Secondary | ICD-10-CM | POA: Diagnosis not present

## 2020-09-09 DIAGNOSIS — Z6820 Body mass index (BMI) 20.0-20.9, adult: Secondary | ICD-10-CM | POA: Diagnosis not present

## 2020-09-09 DIAGNOSIS — R0602 Shortness of breath: Secondary | ICD-10-CM | POA: Diagnosis present

## 2020-09-09 DIAGNOSIS — R5381 Other malaise: Secondary | ICD-10-CM | POA: Diagnosis not present

## 2020-09-09 DIAGNOSIS — R0902 Hypoxemia: Secondary | ICD-10-CM | POA: Diagnosis not present

## 2020-09-09 DIAGNOSIS — I5032 Chronic diastolic (congestive) heart failure: Secondary | ICD-10-CM | POA: Diagnosis present

## 2020-09-09 DIAGNOSIS — R404 Transient alteration of awareness: Secondary | ICD-10-CM | POA: Diagnosis not present

## 2020-09-09 DIAGNOSIS — E118 Type 2 diabetes mellitus with unspecified complications: Secondary | ICD-10-CM | POA: Diagnosis not present

## 2020-09-09 DIAGNOSIS — R636 Underweight: Secondary | ICD-10-CM | POA: Diagnosis present

## 2020-09-09 DIAGNOSIS — E1122 Type 2 diabetes mellitus with diabetic chronic kidney disease: Secondary | ICD-10-CM | POA: Diagnosis present

## 2020-09-09 DIAGNOSIS — J439 Emphysema, unspecified: Secondary | ICD-10-CM | POA: Diagnosis not present

## 2020-09-09 DIAGNOSIS — R001 Bradycardia, unspecified: Secondary | ICD-10-CM | POA: Diagnosis not present

## 2020-09-09 LAB — BASIC METABOLIC PANEL
Anion gap: 8 (ref 5–15)
BUN: 30 mg/dL — ABNORMAL HIGH (ref 8–23)
CO2: 32 mmol/L (ref 22–32)
Calcium: 9.1 mg/dL (ref 8.9–10.3)
Chloride: 94 mmol/L — ABNORMAL LOW (ref 98–111)
Creatinine, Ser: 1.26 mg/dL — ABNORMAL HIGH (ref 0.61–1.24)
GFR, Estimated: 53 mL/min — ABNORMAL LOW (ref >60)
Glucose, Bld: 157 mg/dL — ABNORMAL HIGH (ref 70–99)
Potassium: 4.7 mmol/L (ref 3.5–5.1)
Sodium: 134 mmol/L — ABNORMAL LOW (ref 135–145)

## 2020-09-09 LAB — GLUCOSE, CAPILLARY
Glucose-Capillary: 137 mg/dL — ABNORMAL HIGH (ref 70–99)
Glucose-Capillary: 140 mg/dL — ABNORMAL HIGH (ref 70–99)

## 2020-09-09 LAB — SARS CORONAVIRUS 2 BY RT PCR (HOSPITAL ORDER, PERFORMED IN ~~LOC~~ HOSPITAL LAB): SARS Coronavirus 2: NEGATIVE

## 2020-09-09 MED ORDER — GABAPENTIN 100 MG PO CAPS
200.0000 mg | ORAL_CAPSULE | Freq: Three times a day (TID) | ORAL | 0 refills | Status: DC
Start: 2020-09-09 — End: 2020-09-17

## 2020-09-09 MED ORDER — POTASSIUM CHLORIDE CRYS ER 20 MEQ PO TBCR
20.0000 meq | EXTENDED_RELEASE_TABLET | Freq: Two times a day (BID) | ORAL | 0 refills | Status: DC
Start: 2020-09-09 — End: 2020-09-17

## 2020-09-09 MED ORDER — HYDROCODONE-ACETAMINOPHEN 5-325 MG PO TABS: 1.0000 | ORAL_TABLET | ORAL | 0 refills | Status: DC | PRN

## 2020-09-09 MED ORDER — TORSEMIDE 20 MG PO TABS
20.0000 mg | ORAL_TABLET | Freq: Every day | ORAL | Status: DC
  Administered 2020-09-09: 20 mg via ORAL
  Filled 2020-09-09: qty 1

## 2020-09-09 MED ORDER — TORSEMIDE 20 MG PO TABS
20.0000 mg | ORAL_TABLET | Freq: Every day | ORAL | 0 refills | Status: DC
Start: 2020-09-09 — End: 2020-09-17

## 2020-09-09 NOTE — Care Management Important Message (Signed)
Important Message  Patient Details  Name: Dylan Knight MRN: [id number] Date of Birth: [birth year]   Medicare Important Message Given:  Yes     [name] 09/09/2020, 12:10 PM

## 2020-09-09 NOTE — Discharge Summary (Addendum)
Physician Discharge Summary  LESSLIE MCKEEHAN GDJ:[id number] DOB: 1924/02/15 DOA: 09/03/2020  PCP: Tonia Ghent, MD  Admit date: 09/03/2020 Discharge date: 09/09/2020  Recommendations for Outpatient Follow-up:  1. Discharge to SNF on 4L O2. 2. Have chemistry checked on 09/12/2020 and report to facility physician. 3. Follow up with PCP in 7-10 days after discharge from SNF. 4. Follow up with Cardiology within 2 weeks of discharge from SNF. 5. Wear compression hose during waking hours.   Contact information for follow-up providers    [facility] Follow up on 09/23/2020.   Specialty: Cardiology Why: at 12:00pm. Enter through the [facility] entrance Contact information: [place] [phone]           Contact information for after-discharge care    [facility] .   Service: Skilled Nursing Contact information: [place] [phone]                   Discharge Diagnoses: Principal diagnosis is #1 1. Right heart failure 2. Severe pulmonary artery hypertension 3. Volume overload 4. Cor pulmonale 5. COPD 6. DM II 7. Venous stasis dermatitis 8. Interstitial Lung Disease 9. Hypothyroidism 10. Hyperlipidemia 11. CAD  Discharge Condition: Fair  Disposition: SNF  Diet recommendation: Heart healthy/Modified carbohydrates  Filed Weights   09/07/20 0500 09/08/20 0500 09/09/20 0700  Weight: 63.9 kg 64.5 kg 63.6 kg    History of present illness: RODERICK CALO is a 86 y.o. male with medical history significant of known systolic CHF, diabetes, hyperlipidemia, hypertension, hypothyroidism, peripheral vascular disease, pulmonary fibrosis BPH, osteoarthritis, hyperlipidemia and COPD who has been having significant weight gain and fluid retention over the last couple of weeks.  Patient  has had increased dose of his home Lasix but continues to be significantly fluid overloaded.  He has had anasarca.  Currently over 10 pounds above his usual weight.  Came to the ER with progressive shortness of breath and worsening lower extremity edema..  Denied any sick contact.  No fever or chills no significant cough.  Patient seen and evaluated and appears to have anasarca consistent with worsening CHF.  He is being admitted to the hospital for further evaluation and treatment.  Patient has been communicated with his primary care physician was outpatient treatment and adjustment of medications attempted but has not been successful so he has failed outpatient treatment..  ED Course: Temperature 98.4 blood pressure 143/80 pulse 83 respirate 20 oxygen sat 90% 2 L.  CBC sent chemistry mostly within normal except creatinine 1.43 and glucose 111.  Chest x-ray showed interstitial lung disease consistent with known interstitial pneumonia.  COVID-19 and viral screen currently pending.  Admitted for further treatment.    Hospital Course:  Jaamal Farooqui Wimbishis a 86 y.o.malewith medical history significant ofknown systolic CHF, diabetes, hyperlipidemia, hypertension, hypothyroidism, peripheral vascular disease, pulmonary fibrosis BPH, osteoarthritis, hyperlipidemia and COPD who has been having significant weight gain and fluid retention over the last couple of weeks. Patient has had increased dose of his home Lasix but continues to be significantly fluid overloaded. He has had anasarca. Currently over 10 pounds above his usual weight. Came to the ER with progressive shortness of breath and worsening lower extremity edema.. Denied any sick contact. No fever or chills no significant cough. Patient seen and evaluated and appears to have anasarca consistent with worsening CHF. He is being  admitted to the hospital for further evaluation and treatment. Patient has been communicated with his primary care  physician was outpatient treatment and adjustment of medications attempted but has not been successful so he has failed outpatient treatment..  ED Course:Temperature 98.4 blood pressure 143/80 pulse 83 respirate 20 oxygen sat 90% 2 L. CBC sent chemistry mostly within normal except creatinine 1.43 and glucose 111. Chest x-ray showed interstitial lung disease consistent with known interstitial pneumonia. COVID-19 and viral screen currently pending. Admitted for further treatment.  The patient has been admitted to a telemetry bed. Diuresis had to be discontinued last night due to the patient's low blood pressures. Echocardiogram is pending.  The patient has been admitted to a telemetry bed. Wound care was consulted for the weeping in his legs and have recommended compression hose that fit appropriately.  Echocardiogram was obtained on 09/05/2020. It demonstrated LV EF of 55-60% with no regional motion abnormalities with septal flattening consistent with RV pressure and volume overload. RV function is moderately reduced with severely enlarged RV. The PASP was severely elevated to 82.6 mmHg. Right atrial size is severely dilalted. There was mild mitral valve regurgitation. Tricuspid regurgitation is moderate to severe. This echocardiogram is substantially worse than that read in 07/2020.  CTA of the chest was negative for pulmonary embolus. It did demonstrated ground glass opacities and possible bronchiolitis. Will check lower extremity doppler to rule out DVT.  Cardiology has been consulted, and the heart failure team is involved. Attempts to diurese the patient have been complicated by hypotension. Right heart catheterization was considered, but decided against by cardiology due to the patient's fragile health and age.   Bilateral lower extremity dopplers are negative for DVT. Lower extremity edema is resolving.   The patient has been cleared by cardiology for discharge to SNF.  Today's  assessment: S: The patient is resting quietly. No new complaints. O: Vitals:  Vitals:   09/09/20 0800 09/09/20 1054  BP: 102/76 98/63  Pulse: (!) 135 100  Resp: 18 16  Temp: 97.7 F (36.5 C) 97.8 F (36.6 C)  SpO2: 98% 98%   Exam:  Constitutional:   The patient is awake, alert, and oriented x 3. No acute distress.  Respiratory:   No increased work of breathing.  No wheezes or rhonchi  Positive for rales at bases.  No tactile fremitus Cardiovascular:   Regular rate and rhythm  No murmurs, ectopy, or gallups.  No lateral PMI. No thrills. Abdomen:   Abdomen is soft, non-tender, non-distended  No hernias, masses, or organomegaly  Normoactive bowel sounds.  Musculoskeletal:   No cyanosis, clubbing, or edema. Skin:   No rashes, lesions, ulcers  palpation of skin: no induration or nodules  Mild erythema to bilateral lower extremities, left worse than right.   Wrinkling present demonstrating success of diuresis. Neurologic:   CN 2-12 intact  Sensation all 4 extremities intact Psychiatric:   Mental status ? Mood, affect appropriate ? Orientation to person, place, time   judgment and insight appear intact Discharge Instructions  Discharge Instructions    Call MD for:  difficulty breathing, headache or visual disturbances   Complete by: As directed    Call MD for:  extreme fatigue   Complete by: As directed    Call MD for:  persistant dizziness or light-headedness   Complete by: As directed    Diet - low sodium heart healthy   Complete by: As directed    Discharge instructions   Complete by: As directed  Discharge to SNF on 4L O2. Have chemistry checked on 09/12/2020 and report to facility physician. Follow up with PCP in 7-10 days after discharge from SNF. Follow up with Cardiology within 2 weeks of discharge from SNF.   Heart Failure patients record your daily weight using the same scale at the same time of day   Complete by: As directed     Increase activity slowly   Complete by: As directed      Allergies as of 09/09/2020      Reactions   Iohexol Hives, Other (See Comments)   Went away with benadryl   Iodine Rash      Medication List    STOP taking these medications   furosemide 20 MG tablet Commonly known as: LASIX     TAKE these medications   acetaminophen 325 MG tablet Commonly known as: TYLENOL Take 650 mg by mouth every 6 (six) hours as needed for mild pain.   albuterol 108 (90 Base) MCG/ACT inhaler Commonly known as: VENTOLIN HFA Inhale 2 puffs into the lungs every 6 (six) hours as needed for wheezing or shortness of breath.   atenolol 50 MG tablet Commonly known as: TENORMIN Take 1 tablet (50 mg total) by mouth daily.   CENTRUM SILVER PO Take 1 tablet by mouth daily.   clopidogrel 75 MG tablet Commonly known as: PLAVIX TAKE 1 TABLET (75MG ) BY MOUTH EVERY DAY   clotrimazole-betamethasone cream Commonly known as: LOTRISONE APPLY TO AFFECTED AREA TWICE A DAY   ezetimibe-simvastatin 10-40 MG tablet Commonly known as: VYTORIN TAKE 1 TABLET BY MOUTH EVERY DAY   gabapentin 100 MG capsule Commonly known as: NEURONTIN Take 2 capsules (200 mg total) by mouth 3 (three) times daily.   guaiFENesin 600 MG 12 hr tablet Commonly known as: MUCINEX Take 600 mg by mouth 2 (two) times daily.   HYDROcodone-acetaminophen 5-325 MG tablet Commonly known as: NORCO/VICODIN Take 1 tablet by mouth every 4 (four) hours as needed for severe pain.   levothyroxine 25 MCG tablet Commonly known as: SYNTHROID Take 2 tablets (50 mcg total) by mouth daily before breakfast.   potassium chloride SA 20 MEQ tablet Commonly known as: KLOR-CON Take 1 tablet (20 mEq total) by mouth 2 (two) times daily.   sitaGLIPtin-metformin 50-500 MG tablet Commonly known as: JANUMET Take 1 tablet by mouth daily.   torsemide 20 MG tablet Commonly known as: DEMADEX Take 1 tablet (20 mg total) by mouth daily.      Allergies   Allergen Reactions   Iohexol Hives and Other (See Comments)    Went away with benadryl    Iodine Rash    The results of significant diagnostics from this hospitalization (including imaging, microbiology, ancillary and laboratory) are listed below for reference.    Significant Diagnostic Studies: DG Chest 2 View  Result Date: 09/03/2020 CLINICAL DATA:  Shortness of breath.  History of CHF. EXAM: CHEST - 2 VIEW COMPARISON:  07/21/2020.  CT of 07/22/2020 also reviewed. FINDINGS: Normal heart size. Hyperinflation. Right greater than left basilar predominant pulmonary interstitial thickening. Right-sided basilar opacity is to be progressive compared to the 07/21/2020 chest radiograph. No pleural effusion or pneumothorax. IMPRESSION: Interstitial lung disease, consistent with usual interstitial pneumonia (UIP) on prior CT of 07/22/2020. Increased right lung base opacity for which superimposed infection or aspiration cannot be excluded. Consider radiographic follow-up in 3-5 days. Electronically Signed   By: Abigail Miyamoto M.D.   On: 09/03/2020 18:09   CT ANGIO CHEST PE W OR  WO CONTRAST  Result Date: 09/05/2020 CLINICAL DATA:  Worsening shortness of breath.  Orthopnea. EXAM: CT ANGIOGRAPHY CHEST WITH CONTRAST TECHNIQUE: Multidetector CT imaging of the chest was performed using the standard protocol during bolus administration of intravenous contrast. Multiplanar CT image reconstructions and MIPs were obtained to evaluate the vascular anatomy. CONTRAST:  21mL OMNIPAQUE IOHEXOL 350 MG/ML SOLN COMPARISON:  CT chest dated 07/22/2020 FINDINGS: Cardiovascular: Contrast injection is sufficient to demonstrate satisfactory opacification of the pulmonary arteries to the segmental level. There is no pulmonary embolus or evidence of right heart strain. The size of the main pulmonary artery is normal. Normal heart size with coronary artery calcification. There are atherosclerotic changes throughout the thoracic  aorta without evidence for an aneurysm. Mediastinum/Nodes: --mild mediastinal adenopathy is noted, likely reactive. -- No hilar lymphadenopathy. -- No axillary lymphadenopathy. -- No supraclavicular lymphadenopathy. -- Normal thyroid gland where visualized. -  Unremarkable esophagus. Lungs/Pleura: Moderate severe emphysematous changes are again noted. There is persistent ground-glass opacification of the right lobe as before. There is slight interval worsening at the right lung base. There is mucous plugging and bronchial wall thickening primarily in the right lower lobe. There is no pneumothorax. No large pleural effusion. There is secretions within the upper trachea, otherwise the trachea is unremarkable. The previously demonstrated 1 cm pulmonary nodule in the right lower lobe appears to have essentially resolved. Upper Abdomen: Contrast bolus timing is not optimized for evaluation of the abdominal organs. The visualized portions of the organs of the upper abdomen are normal. Musculoskeletal: No chest wall abnormality. No bony spinal canal stenosis. Review of the MIP images confirms the above findings. IMPRESSION: 1. No evidence for acute pulmonary embolus. 2. Persistent findings of interstitial lung disease, not substantially changed from prior study. 3. New bronchial wall thickening and areas of consolidation involving primarily the right lower lobe is suggestive of infectious or reactive bronchiolitis. Aortic Atherosclerosis (ICD10-I70.0) and Emphysema (ICD10-J43.9). Electronically Signed   By: Constance Holster M.D.   On: 09/05/2020 20:12   US Venous Img Lower Bilateral (DVT)  Result Date: 09/07/2020 CLINICAL DATA:  86 year old with lower extremity edema. EXAM: BILATERAL LOWER EXTREMITY VENOUS DOPPLER ULTRASOUND TECHNIQUE: Gray-scale sonography with graded compression, as well as color Doppler and duplex ultrasound were performed to evaluate the lower extremity deep venous systems from the level of the  common femoral vein and including the common femoral, femoral, profunda femoral, popliteal and calf veins including the posterior tibial, peroneal and gastrocnemius veins when visible. The superficial great saphenous vein was also interrogated. Spectral Doppler was utilized to evaluate flow at rest and with distal augmentation maneuvers in the common femoral, femoral and popliteal veins. COMPARISON:  05/06/2017 FINDINGS: RIGHT LOWER EXTREMITY Common Femoral Vein: No evidence of thrombus. Normal compressibility, respiratory phasicity and response to augmentation. Saphenofemoral Junction: No evidence of thrombus. Normal compressibility and flow on color Doppler imaging. Profunda Femoral Vein: No evidence of thrombus. Normal compressibility and flow on color Doppler imaging. Femoral Vein: No evidence of thrombus. Normal compressibility, respiratory phasicity and response to augmentation. Popliteal Vein: No evidence of thrombus. Normal compressibility, respiratory phasicity and response to augmentation. Calf Veins: No evidence of thrombus. Normal compressibility and flow on color Doppler imaging. Other Findings:  None. LEFT LOWER EXTREMITY Common Femoral Vein: No evidence of thrombus. Normal compressibility, respiratory phasicity and response to augmentation. Saphenofemoral Junction: No evidence of thrombus. Normal compressibility and flow on color Doppler imaging. Profunda Femoral Vein: No evidence of thrombus. Normal compressibility and flow on color Doppler imaging.  Femoral Vein: No evidence of thrombus. Normal compressibility, respiratory phasicity and response to augmentation. Popliteal Vein: No evidence of thrombus. Normal compressibility, respiratory phasicity and response to augmentation. Calf Veins: No evidence of thrombus. Normal compressibility and flow on color Doppler imaging. Other Findings:  None. IMPRESSION: No evidence of deep venous thrombosis in either lower extremity. Electronically Signed   By: Markus Daft M.D.   On: 09/07/2020 13:04   ECHOCARDIOGRAM COMPLETE  Result Date: 09/05/2020    ECHOCARDIOGRAM REPORT   Patient Name:   KENSON GROH Date of Exam: 09/05/2020 Medical Rec #:  [id number]       Height:       66.0 in Accession #:    [id number]      Weight:       143.8 lb Date of Birth:  [birth year]       BSA:          1.738 m Patient Age:    58 years        BP:           140/68 mmHg Patient Gender: M               HR:           78 bpm. Exam Location:  [facility] Procedure: 2D Echo, Cardiac Doppler and Color Doppler Indications:     CHF-acute systolic 973.53  History:         Patient has prior history of Echocardiogram examinations, most                  recent 07/20/2020. COPD and Stroke; Risk Factors:Hypertension                  and Diabetes.  Sonographer:     JERRY Referring Phys:  [id number] Jahleel Stroschein Diagnosing Phys: Ida Rogue MD  Sonographer Comments: Technically challenging study due to limited acoustic windows, no apical window and no parasternal window. Image acquisition challenging due to patient body habitus. IMPRESSIONS  1. Left ventricular ejection fraction, by estimation, is 60 to 65%. The left ventricle has normal function. The left ventricle has no regional wall motion abnormalities with septal flattening consistent with RV pressure and volume overload..  2. Right ventricular systolic function is moderately reduced. The right ventricular size is severely enlarged. There is severely elevated pulmonary artery systolic pressure. The estimated right ventricular systolic pressure is 42.6 mmHg.  3. Right atrial size was severely dilated.  4. Mild mitral valve regurgitation.  5. Tricuspid valve regurgitation is moderate to severe. FINDINGS  Left Ventricle: Left ventricular ejection fraction, by estimation, is 55 to 60%. The left ventricle has normal function. The left ventricle has no regional wall motion abnormalities. The left ventricular internal cavity size was normal in size. There is  no left  ventricular hypertrophy. Left ventricular diastolic parameters are indeterminate. Right Ventricle: The right ventricular size is severely enlarged. No increase in right ventricular wall thickness. Right ventricular systolic function is moderately reduced. There is severely elevated pulmonary artery systolic pressure. The tricuspid regurgitant velocity is 4.26 m/s, and with an assumed right atrial pressure of 10 mmHg, the estimated right ventricular systolic pressure is 83.4 mmHg. Left Atrium: Left atrial size was normal in size. Right Atrium: Right atrial size was severely dilated. Pericardium: There is no evidence of pericardial effusion. Mitral Valve: The mitral valve is normal in structure. Mild mitral valve regurgitation. No evidence of mitral valve stenosis. Tricuspid Valve: The tricuspid valve is normal in structure.  Tricuspid valve regurgitation is moderate to severe. No evidence of tricuspid stenosis. Aortic Valve: The aortic valve is normal in structure. Aortic valve regurgitation is not visualized. Mild to moderate aortic valve sclerosis/calcification is present, without any evidence of aortic stenosis. Pulmonic Valve: The pulmonic valve was normal in structure. Pulmonic valve regurgitation is not visualized. No evidence of pulmonic stenosis. Aorta: The aortic root is normal in size and structure. Venous: The pulmonary veins were not well visualized. The inferior vena cava is normal in size with greater than 50% respiratory variability, suggesting right atrial pressure of 3 mmHg. IAS/Shunts: No atrial level shunt detected by color flow Doppler.  LEFT VENTRICLE PLAX 2D LVIDd:         2.74 cm LVIDs:         1.80 cm LV PW:         1.34 cm LV IVS:        1.32 cm  LEFT ATRIUM         Index LA diam:    3.80 cm 2.19 cm/m  PULMONIC VALVE PV Vmax:        0.68 m/s PV Peak grad:   1.9 mmHg RVOT Peak grad: 2 mmHg  TRICUSPID VALVE TR Peak grad:   72.6 mmHg TR Vmax:        426.00 cm/s Ida Rogue MD Electronically  signed by Ida Rogue MD Signature Date/Time: 09/05/2020/12:51:23 PM    Final     Microbiology: Recent Results (from the past 240 hour(s))  Respiratory Panel by RT PCR (Flu A&B, Covid) - Nasopharyngeal Swab     Status: None   Collection Time: 09/03/20 10:25 PM   Specimen: Nasopharyngeal Swab  Result Value Ref Range Status   SARS Coronavirus 2 by RT PCR NEGATIVE NEGATIVE Final    Comment: (NOTE) SARS-CoV-2 target nucleic acids are NOT DETECTED.  The SARS-CoV-2 RNA is generally detectable in upper respiratoy specimens during the acute phase of infection. The lowest concentration of SARS-CoV-2 viral copies this assay can detect is 131 copies/mL. A negative result does not preclude SARS-Cov-2 infection and should not be used as the sole basis for treatment or other patient management decisions. A negative result may occur with  improper specimen collection/handling, submission of specimen other than nasopharyngeal swab, presence of viral mutation(s) within the areas targeted by this assay, and inadequate number of viral copies (<131 copies/mL). A negative result must be combined with clinical observations, patient history, and epidemiological information. The expected result is Negative.  Fact Sheet for Patients:  PinkCheek.be  Fact Sheet for Healthcare Providers:  GravelBags.it  This test is no t yet approved or cleared by the [place] FDA and  has been authorized for detection and/or diagnosis of SARS-CoV-2 by FDA under an Emergency Use Authorization (EUA). This EUA will remain  in effect (meaning this test can be used) for the duration of the COVID-19 declaration under Section 564(b)(1) of the Act, 21 U.S.C. section 360bbb-3(b)(1), unless the authorization is terminated or revoked sooner.     Influenza A by PCR NEGATIVE NEGATIVE Final   Influenza B by PCR NEGATIVE NEGATIVE Final    Comment: (NOTE) The Xpert  Xpress SARS-CoV-2/FLU/RSV assay is intended as an aid in  the diagnosis of influenza from Nasopharyngeal swab specimens and  should not be used as a sole basis for treatment. Nasal washings and  aspirates are unacceptable for Xpert Xpress SARS-CoV-2/FLU/RSV  testing.  Fact Sheet for Patients: PinkCheek.be  Fact Sheet for Healthcare Providers: GravelBags.it  This  test is not yet approved or cleared by the [place] and  has been authorized for detection and/or diagnosis of SARS-CoV-2 by  FDA under an Emergency Use Authorization (EUA). This EUA will remain  in effect (meaning this test can be used) for the duration of the  Covid-19 declaration under Section 564(b)(1) of the Act, 21  U.S.C. section 360bbb-3(b)(1), unless the authorization is  terminated or revoked. Performed at [facility], [address] [place], [place] [address]   SARS Coronavirus 2 by RT PCR (hospital order, performed in [facility] hospital lab) Nasopharyngeal Nasopharyngeal Swab     Status: None   Collection Time: 09/05/20  2:50 PM   Specimen: Nasopharyngeal Swab  Result Value Ref Range Status   SARS Coronavirus 2 NEGATIVE NEGATIVE Final    Comment: (NOTE) SARS-CoV-2 target nucleic acids are NOT DETECTED.  The SARS-CoV-2 RNA is generally detectable in upper and lower respiratory specimens during the acute phase of infection. The lowest concentration of SARS-CoV-2 viral copies this assay can detect is 250 copies / mL. A negative result does not preclude SARS-CoV-2 infection and should not be used as the sole basis for treatment or other patient management decisions.  A negative result may occur with improper specimen collection / handling, submission of specimen other than nasopharyngeal swab, presence of viral mutation(s) within the areas targeted by this assay, and inadequate number of viral copies (<250 copies / mL). A negative  result must be combined with clinical observations, patient history, and epidemiological information.  Fact Sheet for Patients:   StrictlyIdeas.no  Fact Sheet for Healthcare Providers: BankingDealers.co.za  This test is not yet approved or  cleared by the [place] FDA and has been authorized for detection and/or diagnosis of SARS-CoV-2 by FDA under an Emergency Use Authorization (EUA).  This EUA will remain in effect (meaning this test can be used) for the duration of the COVID-19 declaration under Section 564(b)(1) of the Act, 21 U.S.C. section 360bbb-3(b)(1), unless the authorization is terminated or revoked sooner.  Performed at [facility], [address] [place], [place] [address]      Labs: Basic Metabolic Panel: Recent Labs  Lab 09/05/20 [phone] 09/06/20 0443 09/07/20 0521 09/08/20 0338 09/09/20 0453  NA 133* 134* 136 133* 134*  K 3.5 3.3* 3.3* 4.5 4.7  CL 95* 95* 99 98 94*  CO2 27 27 29 27  32  GLUCOSE 111* 188* 153* 155* 157*  BUN 26* 26* 28* 27* 30*  CREATININE 1.25* 1.20 1.20 1.10 1.26*  CALCIUM 9.0 8.9 8.7* 8.8* 9.1   Liver Function Tests: No results for input(s): AST, ALT, ALKPHOS, BILITOT, PROT, ALBUMIN in the last 168 hours. No results for input(s): LIPASE, AMYLASE in the last 168 hours. No results for input(s): AMMONIA in the last 168 hours. CBC: Recent Labs  Lab 09/03/20 1708 09/03/20 2225 09/04/20 1150 09/06/20 0443 09/08/20 0338  WBC 8.2 8.3 8.9 5.5 6.7  NEUTROABS  --   --  6.9 5.0 5.0  HGB 14.6 15.3 15.7 15.1 15.0  HCT 43.2 45.3 45.6 41.9 43.9  MCV 103.3* 104.1* 102.0* 99.1 102.1*  PLT 201 200 221 176 158   Cardiac Enzymes: No results for input(s): CKTOTAL, CKMB, CKMBINDEX, TROPONINI in the last 168 hours. BNP: BNP (last 3 results) Recent Labs    09/03/20 1708 09/06/20 1436 09/07/20 0610  BNP 1,496.3* 1,007.5* 479.4*    ProBNP (last 3 results) Recent Labs     08/01/20 1122  PROBNP 1,229.0*  CBG: Recent Labs  Lab 09/08/20 1152 09/08/20 1631 09/08/20 2111 09/09/20 0736 09/09/20 1123  GLUCAP 189* 122* 184* 137* 140*    Principal Problem:   Acute congestive heart failure (HCC) Active Problems:   COPD (chronic obstructive pulmonary disease) (HCC)   Diabetes mellitus type 2, controlled (HCC)   CAD (coronary artery disease)   Hypothyroidism   Hyperlipemia   Edema   Acute respiratory failure with hypoxia (HCC)   ILD (interstitial lung disease) ([place])   Time coordinating discharge: 38 minutes  Signed:        Ladislaus Repsher, DO [facility]  09/09/2020, 11:50 AM

## 2020-09-09 NOTE — Progress Notes (Signed)
Mobility Specialist - Progress Note   09/09/20 1159  Mobility  Activity Sat and stood x 3;Dangled on edge of bed (+ 3 R lateral steps)  Level of Assistance Minimal assist, patient does 75% or more  Assistive Device Front wheel walker  Mobility Response Tolerated well  Mobility performed by Mobility specialist  $Mobility charge 1 Mobility    Pre-mobility: 113 HR, 93%SpO2 During mobility: 123 HR, 87% SpO2 Post-mobility: 110 HR, 93% SpO2   Pt sleeping in bed upon arrival. Pt awaken after 2 attempts. Pt agreed to session. Pt able to get to EOB SBA, HHA for UE support. Noted pt's HR sitting at 116-120 while dangling EOB. Pt needed min assist S2S for steadying. Standing at bedside, HR increased up to the 130s. Pt O2 desat to 87% during session. Pt on 4L O2 [place]. Pt c/o slight SOB. Pt educated on PLB to manage O2 and SOB. Pt able to take 3 R lateral shuffling steps towards HOB w/ min. Assist for steadying. Pt needed VC for hand placement transitioning from stand to sit. Pt needed mod. Assist for transitioning sit to supine for LE support. Nurse entered the room at the end of session d/t elevated HR. HR trending b/w 110s-120s after session. Pt left laying in bed w/ nurse present in room. Bed alarm set and all needs placed in reach.    Halden Phegley Mobility Specialist  09/09/20, 12:07 PM

## 2020-09-09 NOTE — Progress Notes (Signed)
This note also relates to the following rows which could not be included: ECG Heart Rate - Cannot attach notes to unvalidated device data    09/09/20 0800  Assess: MEWS Score  Temp 97.7 F (36.5 C)  BP 102/76  Pulse Rate (!) 135  Resp 18  SpO2 98 %  O2 Device Nasal Cannula  Patient Activity (if Appropriate) Other (Comment) (SITTING UP AND EATING CAUSES ELEVATED HR)  Assess: MEWS Score  MEWS Temp 0  MEWS Systolic 0  MEWS Pulse 3  MEWS RR 0  MEWS LOC 0  MEWS Score 3  MEWS Score Color Yellow  Assess: if the MEWS score is Yellow or Red  Were vital signs taken at a resting state? No  Focused Assessment Change from prior assessment (see assessment flowsheet)  Early Detection of Sepsis Score *See Row Information* Low  MEWS guidelines implemented *See Row Information* Yes  Treat  MEWS Interventions Other (Comment) (Change position from sitting to lying)  Pain Scale 0-10  Pain Score 2  Take Vital Signs  Increase Vital Sign Frequency  Yellow: Q 2hr X 2 then Q 4hr X 2, if remains yellow, continue Q 4hrs  Escalate  MEWS: Escalate Yellow: discuss with charge nurse/RN and consider discussing with provider and RRT (discuss with MD)  Notify: Provider  Provider Name/Title Dr Benny Lennert  Date Provider Notified 09/09/20  Time Provider Notified [id number]  Notification Type Page  Notification Reason Change in status  Response See new orders (EKG)  Date of Provider Response 09/09/20  Time of Provider Response 0815  Document  Patient Outcome Other (Comment)

## 2020-09-09 NOTE — Progress Notes (Signed)
Progress Note  Patient Name: Dylan Knight Date of Encounter: 09/09/2020  Primary Cardiologist: [facility], Dr. Rockey Situ  Subjective   He feels significantly better less short of breath with less leg edema.  He walked with physical therapy and they recommended SNF with rehab.  The patient is excited about getting stronger.  Inpatient Medications    Scheduled Meds: . clopidogrel  75 mg Oral Daily  . enoxaparin (LOVENOX) injection  40 mg Subcutaneous Q2200  . ezetimibe  10 mg Oral Daily   And  . simvastatin  40 mg Oral Daily  . furosemide  40 mg Intravenous BID  . gabapentin  200 mg Oral TID  . guaiFENesin  600 mg Oral BID  . insulin aspart  0-5 Units Subcutaneous QHS  . insulin aspart  0-9 Units Subcutaneous TID WC  . levothyroxine  50 mcg Oral Q0600  . potassium chloride  20 mEq Oral BID  . sodium chloride flush  3 mL Intravenous Q12H   Continuous Infusions: . sodium chloride     PRN Meds: sodium chloride, acetaminophen, albuterol, HYDROcodone-acetaminophen, ondansetron (ZOFRAN) IV, sodium chloride flush   Vital Signs    Vitals:   09/09/20 0020 09/09/20 0343 09/09/20 0700 09/09/20 0800  BP: 115/73 (!) 126/91  102/76  Pulse: (!) 102 (!) 103  (!) 135  Resp: 16 18  18   Temp: (!) 97.4 F (36.3 C) 97.7 F (36.5 C)  97.7 F (36.5 C)  TempSrc: Oral Oral  Oral  SpO2: 97% 97%  98%  Weight:   63.6 kg   Height:        Intake/Output Summary (Last 24 hours) at 09/09/2020 1033 Last data filed at 09/09/2020 0900 Gross per 24 hour  Intake 240 ml  Output 3950 ml  Net -3710 ml   Last 3 Weights 09/09/2020 09/08/2020 09/07/2020  Weight (lbs) 140 lb 3.4 oz 142 lb 3.2 oz 140 lb 14 oz  Weight (kg) 63.6 kg 64.5 kg 63.9 kg      Telemetry    NSR - Personally Reviewed  ECG    No new tracings- Personally Reviewed  Physical Exam   GEN: No acute distress.  Neck:  No JVD Cardiac: RRR, 2/6 systolic murmur best appreciated LLSB.  No rubs or gallops.  Respiratory:  Good  aeration at bases.  GI: Soft, nontender, non-distended  MS:  Nosignificant lower extremity edema.  Bandages noted on both of his anterior lower extremities and placed over his tibia; No deformity. Neuro:  Nonfocal  Psych: Normal affect   Labs    High Sensitivity Troponin:   Recent Labs  Lab 09/03/20 1708 09/03/20 2225  TROPONINIHS 14 17      Cardiac EnzymesNo results for input(s): TROPONINI in the last 168 hours. No results for input(s): TROPIPOC in the last 168 hours.   Chemistry Recent Labs  Lab 09/07/20 0521 09/08/20 0338 09/09/20 0453  NA 136 133* 134*  K 3.3* 4.5 4.7  CL 99 98 94*  CO2 29 27 32  GLUCOSE 153* 155* 157*  BUN 28* 27* 30*  CREATININE 1.20 1.10 1.26*  CALCIUM 8.7* 8.8* 9.1  GFRNONAA 56* >60 53*  ANIONGAP 8 8 8      Hematology Recent Labs  Lab 09/04/20 1150 09/06/20 0443 09/08/20 0338  WBC 8.9 5.5 6.7  RBC 4.47 4.23 4.30  HGB 15.7 15.1 15.0  HCT 45.6 41.9 43.9  MCV 102.0* 99.1 102.1*  MCH 35.1* 35.7* 34.9*  MCHC 34.4 36.0 34.2  RDW 14.7 14.0  14.0  PLT 221 176 158    BNP Recent Labs  Lab 09/03/20 1708 09/06/20 1436 09/07/20 0610  BNP 1,496.3* 1,007.5* 479.4*     DDimer No results for input(s): DDIMER in the last 168 hours.   Radiology    US Venous Img Lower Bilateral (DVT)  Result Date: 09/07/2020 CLINICAL DATA:  86 year old with lower extremity edema. EXAM: BILATERAL LOWER EXTREMITY VENOUS DOPPLER ULTRASOUND TECHNIQUE: Gray-scale sonography with graded compression, as well as color Doppler and duplex ultrasound were performed to evaluate the lower extremity deep venous systems from the level of the common femoral vein and including the common femoral, femoral, profunda femoral, popliteal and calf veins including the posterior tibial, peroneal and gastrocnemius veins when visible. The superficial great saphenous vein was also interrogated. Spectral Doppler was utilized to evaluate flow at rest and with distal augmentation maneuvers in  the common femoral, femoral and popliteal veins. COMPARISON:  05/06/2017 FINDINGS: RIGHT LOWER EXTREMITY Common Femoral Vein: No evidence of thrombus. Normal compressibility, respiratory phasicity and response to augmentation. Saphenofemoral Junction: No evidence of thrombus. Normal compressibility and flow on color Doppler imaging. Profunda Femoral Vein: No evidence of thrombus. Normal compressibility and flow on color Doppler imaging. Femoral Vein: No evidence of thrombus. Normal compressibility, respiratory phasicity and response to augmentation. Popliteal Vein: No evidence of thrombus. Normal compressibility, respiratory phasicity and response to augmentation. Calf Veins: No evidence of thrombus. Normal compressibility and flow on color Doppler imaging. Other Findings:  None. LEFT LOWER EXTREMITY Common Femoral Vein: No evidence of thrombus. Normal compressibility, respiratory phasicity and response to augmentation. Saphenofemoral Junction: No evidence of thrombus. Normal compressibility and flow on color Doppler imaging. Profunda Femoral Vein: No evidence of thrombus. Normal compressibility and flow on color Doppler imaging. Femoral Vein: No evidence of thrombus. Normal compressibility, respiratory phasicity and response to augmentation. Popliteal Vein: No evidence of thrombus. Normal compressibility, respiratory phasicity and response to augmentation. Calf Veins: No evidence of thrombus. Normal compressibility and flow on color Doppler imaging. Other Findings:  None. IMPRESSION: No evidence of deep venous thrombosis in either lower extremity. Electronically Signed   By: Markus Daft M.D.   On: 09/07/2020 13:04    Cardiac Studies   2D echo 09/05/2020: 1. Left ventricular ejection fraction, by estimation, is 60 to 65%. The  left ventricle has normal function. The left ventricle has no regional  wall motion abnormalities with septal flattening consistent with RV  pressure and volume overload.  2. Right  ventricular systolic function is moderately reduced. The right  ventricular size is severely enlarged. There is severely elevated  pulmonary artery systolic pressure. The estimated right ventricular  systolic pressure is 20.2 mmHg.  3. Right atrial size was severely dilated.  4. Mild mitral valve regurgitation.  5. Tricuspid valve regurgitation is moderate to severe. __________  2D echo 07/20/2020 ([place]): 1. Left ventricular ejection fraction, by estimation, is 60 to 65%. The  left ventricle has normal function. The left ventricle has no regional  wall motion abnormalities. There is mild concentric left ventricular  hypertrophy. Left ventricular diastolic  parameters are consistent with Grade I diastolic dysfunction (impaired  relaxation).  2. Right ventricular systolic function is normal. The right ventricular  size is normal.  3. Left atrial size was mildly dilated.  4. The mitral valve is normal in structure. Trivial mitral valve  regurgitation. No evidence of mitral stenosis. Severe mitral annular  calcification.  5. Tricuspid valve regurgitation is mild to moderate.  6. The aortic valve is  calcified. Aortic valve regurgitation is not  visualized. Mild aortic valve sclerosis is present, with no evidence of  aortic valve stenosis.  7. The inferior vena cava is normal in size with greater than 50%  respiratory variability, suggesting right atrial pressure of 3 mmHg.  Patient Profile     86 y.o. male with history of chronic hypoxic respiratory failure on supplemental oxygen via nasal cannula at 4 L secondary to ILD, pulmonary hypertension, COPD, diastolic dysfunction, CKD 2, diabetes, hypertension, hyperlipidemia, hypothyroidism, PVD s/p right femoral-popliteal U bypass in 08/2001 and aortobifemoral bypass in 2003, carotid artery disease s/p LICA stenting, BPH, OA, and being seen today for exacerbation of HFpEF  with pulmonary hypertension.   Assessment & Plan     1. Acute on chronic diastolic heart failure and pulmonary HTN -Volume overload improved significantly.  Recommend switching to torsemide 20 mg by mouth once daily.  Renal function remains stable with diuresis.  2. Chronic hypoxia - cont home o2  3. HLD - cont statin/zetia  4. PAD/Carotid disease  5. Hypothyroidism Cont levothyroxine  6. Deconditioning -Agree with SNF placement with PT.   For questions or updates, please contact [place] Please consult [email or web address] for contact info under       Signed, Kathlyn [place], MD  09/09/2020, 10:33 AM

## 2020-09-09 NOTE — TOC Progression Note (Signed)
Transition of Care [facility]) - Progression Note    Patient Details  Name: Dylan Knight MRN: [id number] Date of Birth: 1924/03/08  Transition of Care [facility]) CM/SW [place], LCSW Phone Number: 09/09/2020, 12:05 PM  Clinical Narrative:  Patient has orders to discharge to [facility] today. Sent secure chat to MD requesting a new COVID test. Also need DNR and Norco prescription signed.  Expected Discharge Plan: [place] Barriers to Discharge: Continued Medical Work up  Expected Discharge Plan and Services Expected Discharge Plan: [place]   Discharge Planning Services: CM Consult Post Acute Care Choice: [place] Living arrangements for the past 2 months: Apartment Expected Discharge Date: 09/09/20                                     Social Determinants of Health (SDOH) Interventions    Readmission Risk Interventions Readmission Risk Prevention Plan 09/04/2020  Transportation Screening Complete  PCP or Specialist Appt within 5-7 Days Complete  Home Care Screening Complete  Medication Review (RN CM) Referral to Pharmacy  Some recent data might be hidden

## 2020-09-09 NOTE — TOC Transition Note (Signed)
Transition of Care [facility]) - CM/SW Discharge Note   Patient Details  Name: Dylan Knight MRN: [id number] Date of Birth: 15-Sep-1924  Transition of Care [facility]) CM/SW Contact:  Candie Chroman, LCSW Phone Number: 09/09/2020, 2:51 PM   Clinical Narrative:  Patient has orders to discharge to [facility] today. RN has already called report. EMS transport has been arranged and patient is next on the list. No further concerns. CSW signing off.   Final next level of care: [place] Barriers to Discharge: Barriers Resolved   Patient Goals and CMS Choice Patient states their goals for this hospitalization and ongoing recovery are:: I would go to snf for rehab at [facility] if that is recommended. CMS Medicare.gov Compare Post Acute Care list provided to:: Patient Choice offered to / list presented to : Patient  Discharge Placement   Existing PASRR number confirmed : 09/05/20          Patient chooses bed at: [facility] Patient to be transferred to facility by: EMS Name of family member notified: Heywood Footman Patient and family notified of of transfer: 09/09/20  Discharge Plan and Services   Discharge Planning Services: CM Consult Post Acute Care Choice: [place]                               Social Determinants of Health ([place]) Interventions     Readmission Risk Interventions Readmission Risk Prevention Plan 09/04/2020  Transportation Screening Complete  PCP or Specialist Appt within 5-7 Days Complete  Home Care Screening Complete  Medication Review (RN CM) Referral to Pharmacy  Some recent data might be hidden

## 2020-09-10 DIAGNOSIS — E039 Hypothyroidism, unspecified: Secondary | ICD-10-CM | POA: Diagnosis not present

## 2020-09-10 DIAGNOSIS — J849 Interstitial pulmonary disease, unspecified: Secondary | ICD-10-CM | POA: Diagnosis not present

## 2020-09-10 DIAGNOSIS — I25118 Atherosclerotic heart disease of native coronary artery with other forms of angina pectoris: Secondary | ICD-10-CM | POA: Diagnosis not present

## 2020-09-10 DIAGNOSIS — I2721 Secondary pulmonary arterial hypertension: Secondary | ICD-10-CM | POA: Diagnosis not present

## 2020-09-10 DIAGNOSIS — J9611 Chronic respiratory failure with hypoxia: Secondary | ICD-10-CM | POA: Diagnosis not present

## 2020-09-10 DIAGNOSIS — E118 Type 2 diabetes mellitus with unspecified complications: Secondary | ICD-10-CM | POA: Diagnosis not present

## 2020-09-10 DIAGNOSIS — J449 Chronic obstructive pulmonary disease, unspecified: Secondary | ICD-10-CM | POA: Diagnosis not present

## 2020-09-11 ENCOUNTER — Telehealth: Payer: Self-pay | Admitting: Family

## 2020-09-11 NOTE — Telephone Encounter (Signed)
Spoke to staff at [facility] regarding Dylan Knight and how he is doing since his hospital discharge. He is getting daily exercise, following a low sodium diet, and taking medications without any complaints. He still has some SOB and swelling at this time but getting better. They confirmed his follow up [facility]F Clinic appointment on 11/15.   Pinkey Mcjunkin, NT

## 2020-09-12 ENCOUNTER — Ambulatory Visit: Payer: Medicare PPO | Admitting: Pulmonary Disease

## 2020-09-12 ENCOUNTER — Institutional Professional Consult (permissible substitution): Payer: Medicare PPO | Admitting: Pulmonary Disease

## 2020-09-13 ENCOUNTER — Emergency Department: Payer: Medicare PPO

## 2020-09-13 ENCOUNTER — Other Ambulatory Visit: Payer: Self-pay

## 2020-09-13 ENCOUNTER — Encounter: Payer: Self-pay | Admitting: Emergency Medicine

## 2020-09-13 ENCOUNTER — Inpatient Hospital Stay
Admission: EM | Admit: 2020-09-13 | Discharge: 2020-09-17 | DRG: 190 | Disposition: A | Discharge status: Hospice | Payer: Medicare PPO | Source: Skilled Nursing Facility | Attending: Internal Medicine | Admitting: Internal Medicine

## 2020-09-13 ENCOUNTER — Emergency Department
Admission: EM | Admit: 2020-09-13 | Discharge: 2020-09-13 | Disposition: A | Discharge status: Skilled Nursing Facility | Payer: Medicare PPO | Source: Home / Self Care | Attending: Emergency Medicine | Admitting: Emergency Medicine

## 2020-09-13 DIAGNOSIS — Z85828 Personal history of other malignant neoplasm of skin: Secondary | ICD-10-CM

## 2020-09-13 DIAGNOSIS — Z87891 Personal history of nicotine dependence: Secondary | ICD-10-CM

## 2020-09-13 DIAGNOSIS — R410 Disorientation, unspecified: Secondary | ICD-10-CM | POA: Diagnosis not present

## 2020-09-13 DIAGNOSIS — J441 Chronic obstructive pulmonary disease with (acute) exacerbation: Secondary | ICD-10-CM | POA: Diagnosis present

## 2020-09-13 DIAGNOSIS — R636 Underweight: Secondary | ICD-10-CM | POA: Diagnosis present

## 2020-09-13 DIAGNOSIS — H3530 Unspecified macular degeneration: Secondary | ICD-10-CM | POA: Diagnosis present

## 2020-09-13 DIAGNOSIS — I130 Hypertensive heart and chronic kidney disease with heart failure and stage 1 through stage 4 chronic kidney disease, or unspecified chronic kidney disease: Secondary | ICD-10-CM | POA: Diagnosis not present

## 2020-09-13 DIAGNOSIS — J849 Interstitial pulmonary disease, unspecified: Secondary | ICD-10-CM | POA: Diagnosis not present

## 2020-09-13 DIAGNOSIS — I10 Essential (primary) hypertension: Secondary | ICD-10-CM | POA: Diagnosis not present

## 2020-09-13 DIAGNOSIS — Z8673 Personal history of transient ischemic attack (TIA), and cerebral infarction without residual deficits: Secondary | ICD-10-CM

## 2020-09-13 DIAGNOSIS — E039 Hypothyroidism, unspecified: Secondary | ICD-10-CM | POA: Diagnosis present

## 2020-09-13 DIAGNOSIS — R0602 Shortness of breath: Secondary | ICD-10-CM | POA: Diagnosis present

## 2020-09-13 DIAGNOSIS — Z20822 Contact with and (suspected) exposure to covid-19: Secondary | ICD-10-CM | POA: Diagnosis not present

## 2020-09-13 DIAGNOSIS — Z7901 Long term (current) use of anticoagulants: Secondary | ICD-10-CM | POA: Insufficient documentation

## 2020-09-13 DIAGNOSIS — E119 Type 2 diabetes mellitus without complications: Secondary | ICD-10-CM | POA: Insufficient documentation

## 2020-09-13 DIAGNOSIS — R252 Cramp and spasm: Secondary | ICD-10-CM

## 2020-09-13 DIAGNOSIS — I5032 Chronic diastolic (congestive) heart failure: Secondary | ICD-10-CM | POA: Diagnosis present

## 2020-09-13 DIAGNOSIS — J9621 Acute and chronic respiratory failure with hypoxia: Secondary | ICD-10-CM | POA: Diagnosis present

## 2020-09-13 DIAGNOSIS — J8410 Pulmonary fibrosis, unspecified: Secondary | ICD-10-CM | POA: Diagnosis present

## 2020-09-13 DIAGNOSIS — G9340 Encephalopathy, unspecified: Secondary | ICD-10-CM | POA: Diagnosis not present

## 2020-09-13 DIAGNOSIS — E785 Hyperlipidemia, unspecified: Secondary | ICD-10-CM | POA: Diagnosis present

## 2020-09-13 DIAGNOSIS — N400 Enlarged prostate without lower urinary tract symptoms: Secondary | ICD-10-CM | POA: Diagnosis present

## 2020-09-13 DIAGNOSIS — Z79899 Other long term (current) drug therapy: Secondary | ICD-10-CM | POA: Insufficient documentation

## 2020-09-13 DIAGNOSIS — I2510 Atherosclerotic heart disease of native coronary artery without angina pectoris: Secondary | ICD-10-CM | POA: Diagnosis present

## 2020-09-13 DIAGNOSIS — J449 Chronic obstructive pulmonary disease, unspecified: Secondary | ICD-10-CM | POA: Insufficient documentation

## 2020-09-13 DIAGNOSIS — M1612 Unilateral primary osteoarthritis, left hip: Secondary | ICD-10-CM | POA: Diagnosis present

## 2020-09-13 DIAGNOSIS — Z8249 Family history of ischemic heart disease and other diseases of the circulatory system: Secondary | ICD-10-CM

## 2020-09-13 DIAGNOSIS — R001 Bradycardia, unspecified: Secondary | ICD-10-CM | POA: Diagnosis not present

## 2020-09-13 DIAGNOSIS — R52 Pain, unspecified: Secondary | ICD-10-CM | POA: Diagnosis not present

## 2020-09-13 DIAGNOSIS — N183 Chronic kidney disease, stage 3 unspecified: Secondary | ICD-10-CM | POA: Diagnosis present

## 2020-09-13 DIAGNOSIS — R5381 Other malaise: Secondary | ICD-10-CM | POA: Diagnosis not present

## 2020-09-13 DIAGNOSIS — R627 Adult failure to thrive: Secondary | ICD-10-CM

## 2020-09-13 DIAGNOSIS — G9341 Metabolic encephalopathy: Secondary | ICD-10-CM | POA: Diagnosis present

## 2020-09-13 DIAGNOSIS — R0689 Other abnormalities of breathing: Secondary | ICD-10-CM | POA: Diagnosis not present

## 2020-09-13 DIAGNOSIS — R069 Unspecified abnormalities of breathing: Secondary | ICD-10-CM | POA: Diagnosis not present

## 2020-09-13 DIAGNOSIS — J80 Acute respiratory distress syndrome: Secondary | ICD-10-CM | POA: Diagnosis not present

## 2020-09-13 DIAGNOSIS — E1122 Type 2 diabetes mellitus with diabetic chronic kidney disease: Secondary | ICD-10-CM | POA: Diagnosis present

## 2020-09-13 DIAGNOSIS — Z515 Encounter for palliative care: Secondary | ICD-10-CM | POA: Diagnosis not present

## 2020-09-13 DIAGNOSIS — E860 Dehydration: Secondary | ICD-10-CM | POA: Insufficient documentation

## 2020-09-13 DIAGNOSIS — M2550 Pain in unspecified joint: Secondary | ICD-10-CM | POA: Diagnosis not present

## 2020-09-13 DIAGNOSIS — Z79890 Hormone replacement therapy (postmenopausal): Secondary | ICD-10-CM

## 2020-09-13 DIAGNOSIS — Z89421 Acquired absence of other right toe(s): Secondary | ICD-10-CM

## 2020-09-13 DIAGNOSIS — R0902 Hypoxemia: Secondary | ICD-10-CM | POA: Diagnosis not present

## 2020-09-13 DIAGNOSIS — Z7902 Long term (current) use of antithrombotics/antiplatelets: Secondary | ICD-10-CM

## 2020-09-13 DIAGNOSIS — Z7189 Other specified counseling: Secondary | ICD-10-CM

## 2020-09-13 DIAGNOSIS — Z7401 Bed confinement status: Secondary | ICD-10-CM | POA: Diagnosis not present

## 2020-09-13 DIAGNOSIS — Z888 Allergy status to other drugs, medicaments and biological substances status: Secondary | ICD-10-CM

## 2020-09-13 DIAGNOSIS — R911 Solitary pulmonary nodule: Secondary | ICD-10-CM | POA: Diagnosis not present

## 2020-09-13 DIAGNOSIS — R059 Cough, unspecified: Secondary | ICD-10-CM | POA: Diagnosis not present

## 2020-09-13 DIAGNOSIS — I509 Heart failure, unspecified: Secondary | ICD-10-CM | POA: Insufficient documentation

## 2020-09-13 DIAGNOSIS — Z66 Do not resuscitate: Secondary | ICD-10-CM

## 2020-09-13 DIAGNOSIS — E1151 Type 2 diabetes mellitus with diabetic peripheral angiopathy without gangrene: Secondary | ICD-10-CM | POA: Diagnosis present

## 2020-09-13 DIAGNOSIS — R404 Transient alteration of awareness: Secondary | ICD-10-CM | POA: Diagnosis not present

## 2020-09-13 DIAGNOSIS — N1831 Chronic kidney disease, stage 3a: Secondary | ICD-10-CM | POA: Diagnosis present

## 2020-09-13 DIAGNOSIS — I110 Hypertensive heart disease with heart failure: Secondary | ICD-10-CM | POA: Insufficient documentation

## 2020-09-13 DIAGNOSIS — Z7984 Long term (current) use of oral hypoglycemic drugs: Secondary | ICD-10-CM

## 2020-09-13 DIAGNOSIS — Z6820 Body mass index (BMI) 20.0-20.9, adult: Secondary | ICD-10-CM | POA: Diagnosis not present

## 2020-09-13 DIAGNOSIS — I517 Cardiomegaly: Secondary | ICD-10-CM | POA: Diagnosis not present

## 2020-09-13 DIAGNOSIS — D72829 Elevated white blood cell count, unspecified: Secondary | ICD-10-CM

## 2020-09-13 LAB — MAGNESIUM: Magnesium: 2.3 mg/dL (ref 1.7–2.4)

## 2020-09-13 LAB — COMPREHENSIVE METABOLIC PANEL
ALT: 31 U/L (ref 0–44)
AST: 37 U/L (ref 15–41)
Albumin: 3.9 g/dL (ref 3.5–5.0)
Alkaline Phosphatase: 91 U/L (ref 38–126)
Anion gap: 13 (ref 5–15)
BUN: 32 mg/dL — ABNORMAL HIGH (ref 8–23)
CO2: 27 mmol/L (ref 22–32)
Calcium: 10 mg/dL (ref 8.9–10.3)
Chloride: 91 mmol/L — ABNORMAL LOW (ref 98–111)
Creatinine, Ser: 1.43 mg/dL — ABNORMAL HIGH (ref 0.61–1.24)
GFR, Estimated: 45 mL/min — ABNORMAL LOW (ref >60)
Glucose, Bld: 180 mg/dL — ABNORMAL HIGH (ref 70–99)
Potassium: 4.2 mmol/L (ref 3.5–5.1)
Sodium: 131 mmol/L — ABNORMAL LOW (ref 135–145)
Total Bilirubin: 1.7 mg/dL — ABNORMAL HIGH (ref 0.3–1.2)
Total Protein: 7 g/dL (ref 6.5–8.1)

## 2020-09-13 LAB — BRAIN NATRIURETIC PEPTIDE: B Natriuretic Peptide: 810.8 pg/mL — ABNORMAL HIGH (ref 0.0–100.0)

## 2020-09-13 LAB — CBC WITH DIFFERENTIAL/PLATELET
Abs Immature Granulocytes: 0.07 10*3/uL (ref 0.00–0.07)
Basophils Absolute: 0.1 10*3/uL (ref 0.0–0.1)
Basophils Relative: 1 %
Eosinophils Absolute: 0.1 10*3/uL (ref 0.0–0.5)
Eosinophils Relative: 1 %
HCT: 46.4 % (ref 39.0–52.0)
Hemoglobin: 16.1 g/dL (ref 13.0–17.0)
Immature Granulocytes: 1 %
Lymphocytes Relative: 4 %
Lymphs Abs: 0.4 10*3/uL — ABNORMAL LOW (ref 0.7–4.0)
MCH: 35.5 pg — ABNORMAL HIGH (ref 26.0–34.0)
MCHC: 34.7 g/dL (ref 30.0–36.0)
MCV: 102.2 fL — ABNORMAL HIGH (ref 80.0–100.0)
Monocytes Absolute: 1.5 10*3/uL — ABNORMAL HIGH (ref 0.1–1.0)
Monocytes Relative: 14 %
Neutro Abs: 8.8 10*3/uL — ABNORMAL HIGH (ref 1.7–7.7)
Neutrophils Relative %: 79 %
Platelets: 216 10*3/uL (ref 150–400)
RBC: 4.54 MIL/uL (ref 4.22–5.81)
RDW: 13.8 % (ref 11.5–15.5)
WBC: 10.9 10*3/uL — ABNORMAL HIGH (ref 4.0–10.5)
nRBC: 0 % (ref 0.0–0.2)

## 2020-09-13 LAB — BASIC METABOLIC PANEL
Anion gap: 11 (ref 5–15)
BUN: 26 mg/dL — ABNORMAL HIGH (ref 8–23)
CO2: 27 mmol/L (ref 22–32)
Calcium: 9.5 mg/dL (ref 8.9–10.3)
Chloride: 96 mmol/L — ABNORMAL LOW (ref 98–111)
Creatinine, Ser: 1.31 mg/dL — ABNORMAL HIGH (ref 0.61–1.24)
GFR, Estimated: 50 mL/min — ABNORMAL LOW (ref >60)
Glucose, Bld: 158 mg/dL — ABNORMAL HIGH (ref 70–99)
Potassium: 4.1 mmol/L (ref 3.5–5.1)
Sodium: 134 mmol/L — ABNORMAL LOW (ref 135–145)

## 2020-09-13 LAB — CBC
HCT: 46.2 % (ref 39.0–52.0)
Hemoglobin: 15.9 g/dL (ref 13.0–17.0)
MCH: 35.2 pg — ABNORMAL HIGH (ref 26.0–34.0)
MCHC: 34.4 g/dL (ref 30.0–36.0)
MCV: 102.2 fL — ABNORMAL HIGH (ref 80.0–100.0)
Platelets: 234 10*3/uL (ref 150–400)
RBC: 4.52 MIL/uL (ref 4.22–5.81)
RDW: 13.8 % (ref 11.5–15.5)
WBC: 12.5 10*3/uL — ABNORMAL HIGH (ref 4.0–10.5)
nRBC: 0 % (ref 0.0–0.2)

## 2020-09-13 LAB — RESPIRATORY PANEL BY RT PCR (FLU A&B, COVID)
Influenza A by PCR: NEGATIVE
Influenza B by PCR: NEGATIVE
SARS Coronavirus 2 by RT PCR: NEGATIVE

## 2020-09-13 LAB — FIBRIN DERIVATIVES D-DIMER (ARMC ONLY): Fibrin derivatives D-dimer (ARMC): 3206.14 ng{FEU}/mL — ABNORMAL HIGH (ref 0.00–499.00)

## 2020-09-13 LAB — TROPONIN I (HIGH SENSITIVITY)
Troponin I (High Sensitivity): 16 ng/L (ref <18)
Troponin I (High Sensitivity): 13 ng/L (ref <18)

## 2020-09-13 LAB — PROCALCITONIN: Procalcitonin: <0.1 ng/mL

## 2020-09-13 LAB — CBG MONITORING, ED: Glucose-Capillary: 212 mg/dL — ABNORMAL HIGH (ref 70–99)

## 2020-09-13 MED ORDER — ZINC SULFATE 220 (50 ZN) MG PO CAPS
220.0000 mg | ORAL_CAPSULE | Freq: Every day | ORAL | Status: DC
  Administered 2020-09-13 – 2020-09-16 (×3): 220 mg via ORAL
  Filled 2020-09-13 (×4): qty 1

## 2020-09-13 MED ORDER — FUROSEMIDE 10 MG/ML IJ SOLN
60.0000 mg | Freq: Once | INTRAMUSCULAR | Status: AC
  Administered 2020-09-13: 60 mg via INTRAVENOUS
  Filled 2020-09-13: qty 8

## 2020-09-13 MED ORDER — PRAMIPEXOLE DIHYDROCHLORIDE 0.25 MG PO TABS
0.1250 mg | ORAL_TABLET | Freq: Three times a day (TID) | ORAL | Status: DC
  Administered 2020-09-14 – 2020-09-15 (×3): 0.125 mg via ORAL
  Filled 2020-09-13 (×10): qty 0.5

## 2020-09-13 MED ORDER — EZETIMIBE-SIMVASTATIN 10-40 MG PO TABS: 1.0000 | ORAL_TABLET | Freq: Every day | ORAL | Status: DC

## 2020-09-13 MED ORDER — METHYLPREDNISOLONE SODIUM SUCC 125 MG IJ SOLR
125.0000 mg | Freq: Once | INTRAMUSCULAR | Status: AC
  Administered 2020-09-13: 125 mg via INTRAVENOUS
  Filled 2020-09-13: qty 2

## 2020-09-13 MED ORDER — DIPHENHYDRAMINE HCL 25 MG PO CAPS: 50.0000 mg | ORAL_CAPSULE | Freq: Once | ORAL | Status: AC

## 2020-09-13 MED ORDER — DIPHENHYDRAMINE HCL 50 MG/ML IJ SOLN: 50.0000 mg | INTRAMUSCULAR | Status: DC

## 2020-09-13 MED ORDER — GABAPENTIN 100 MG PO CAPS
200.0000 mg | ORAL_CAPSULE | Freq: Three times a day (TID) | ORAL | Status: DC
  Administered 2020-09-13 – 2020-09-15 (×4): 200 mg via ORAL
  Filled 2020-09-13 (×6): qty 2

## 2020-09-13 MED ORDER — SODIUM CHLORIDE 0.9 % IV BOLUS
500.0000 mL | Freq: Once | INTRAVENOUS | Status: AC
  Administered 2020-09-13: 500 mL via INTRAVENOUS

## 2020-09-13 MED ORDER — ADULT MULTIVITAMIN W/MINERALS CH
1.0000 | ORAL_TABLET | Freq: Every day | ORAL | Status: DC
  Administered 2020-09-13 – 2020-09-16 (×3): 1 via ORAL
  Filled 2020-09-13 (×4): qty 1

## 2020-09-13 MED ORDER — ATENOLOL 25 MG PO TABS
50.0000 mg | ORAL_TABLET | Freq: Every day | ORAL | Status: DC
  Administered 2020-09-13: 50 mg via ORAL
  Filled 2020-09-13: qty 2

## 2020-09-13 MED ORDER — AZITHROMYCIN 250 MG PO TABS
250.0000 mg | ORAL_TABLET | Freq: Every day | ORAL | Status: DC
  Administered 2020-09-15 – 2020-09-16 (×2): 250 mg via ORAL
  Filled 2020-09-13 (×2): qty 1

## 2020-09-13 MED ORDER — BACLOFEN 10 MG PO TABS
10.0000 mg | ORAL_TABLET | Freq: Four times a day (QID) | ORAL | Status: DC
  Administered 2020-09-13 – 2020-09-15 (×4): 10 mg via ORAL
  Filled 2020-09-13 (×13): qty 1

## 2020-09-13 MED ORDER — HYDROCODONE-ACETAMINOPHEN 5-325 MG PO TABS
1.0000 | ORAL_TABLET | ORAL | Status: DC | PRN
  Administered 2020-09-13 – 2020-09-16 (×4): 1 via ORAL
  Filled 2020-09-13 (×4): qty 1

## 2020-09-13 MED ORDER — DM-GUAIFENESIN ER 30-600 MG PO TB12
1.0000 | ORAL_TABLET | Freq: Two times a day (BID) | ORAL | Status: DC | PRN
  Administered 2020-09-13: 1 via ORAL
  Filled 2020-09-13: qty 1

## 2020-09-13 MED ORDER — SIMVASTATIN 20 MG PO TABS
40.0000 mg | ORAL_TABLET | Freq: Every day | ORAL | Status: DC
  Administered 2020-09-13: 40 mg via ORAL
  Filled 2020-09-13: qty 2
  Filled 2020-09-13: qty 4

## 2020-09-13 MED ORDER — ALBUTEROL SULFATE (2.5 MG/3ML) 0.083% IN NEBU
2.5000 mg | INHALATION_SOLUTION | RESPIRATORY_TRACT | Status: DC | PRN
  Administered 2020-09-13 – 2020-09-14 (×2): 2.5 mg via RESPIRATORY_TRACT
  Filled 2020-09-13 (×2): qty 3

## 2020-09-13 MED ORDER — LEVOTHYROXINE SODIUM 50 MCG PO TABS
50.0000 ug | ORAL_TABLET | Freq: Every day | ORAL | Status: DC
  Administered 2020-09-15 – 2020-09-16 (×2): 50 ug via ORAL
  Filled 2020-09-13 (×3): qty 1

## 2020-09-13 MED ORDER — INSULIN ASPART 100 UNIT/ML ~~LOC~~ SOLN
0.0000 [IU] | Freq: Every day | SUBCUTANEOUS | Status: DC
  Administered 2020-09-15: 2 [IU] via SUBCUTANEOUS
  Filled 2020-09-13: qty 1

## 2020-09-13 MED ORDER — EZETIMIBE 10 MG PO TABS
10.0000 mg | ORAL_TABLET | Freq: Every day | ORAL | Status: DC
  Administered 2020-09-13: 10 mg via ORAL
  Filled 2020-09-13 (×3): qty 1

## 2020-09-13 MED ORDER — METHYLPREDNISOLONE SODIUM SUCC 40 MG IJ SOLR
40.0000 mg | Freq: Two times a day (BID) | INTRAMUSCULAR | Status: DC
  Administered 2020-09-14 – 2020-09-16 (×5): 40 mg via INTRAVENOUS
  Filled 2020-09-13 (×5): qty 1

## 2020-09-13 MED ORDER — INSULIN ASPART 100 UNIT/ML ~~LOC~~ SOLN
0.0000 [IU] | Freq: Three times a day (TID) | SUBCUTANEOUS | Status: DC
  Administered 2020-09-13: 3 [IU] via SUBCUTANEOUS
  Administered 2020-09-14 – 2020-09-15 (×6): 2 [IU] via SUBCUTANEOUS
  Administered 2020-09-16 (×2): 3 [IU] via SUBCUTANEOUS
  Filled 2020-09-13 (×9): qty 1

## 2020-09-13 MED ORDER — SODIUM CHLORIDE 0.9 % IV SOLN
500.0000 mg | Freq: Once | INTRAVENOUS | Status: DC
  Administered 2020-09-13: 500 mg via INTRAVENOUS
  Filled 2020-09-13: qty 500

## 2020-09-13 MED ORDER — HYDRALAZINE HCL 20 MG/ML IJ SOLN: 5.0000 mg | INTRAMUSCULAR | Status: DC | PRN

## 2020-09-13 MED ORDER — CLOPIDOGREL BISULFATE 75 MG PO TABS
75.0000 mg | ORAL_TABLET | Freq: Every day | ORAL | Status: DC
  Administered 2020-09-13 – 2020-09-16 (×3): 75 mg via ORAL
  Filled 2020-09-13 (×4): qty 1

## 2020-09-13 MED ORDER — IPRATROPIUM-ALBUTEROL 0.5-2.5 (3) MG/3ML IN SOLN
3.0000 mL | RESPIRATORY_TRACT | Status: DC
  Administered 2020-09-13 – 2020-09-15 (×8): 3 mL via RESPIRATORY_TRACT
  Filled 2020-09-13 (×10): qty 3

## 2020-09-13 MED ORDER — TORSEMIDE 20 MG PO TABS
20.0000 mg | ORAL_TABLET | Freq: Every day | ORAL | Status: DC
  Administered 2020-09-13 – 2020-09-16 (×3): 20 mg via ORAL
  Filled 2020-09-13 (×4): qty 1

## 2020-09-13 MED ORDER — ONDANSETRON HCL 4 MG/2ML IJ SOLN: 4.0000 mg | Freq: Three times a day (TID) | INTRAMUSCULAR | Status: DC | PRN

## 2020-09-13 MED ORDER — ENOXAPARIN SODIUM 40 MG/0.4ML ~~LOC~~ SOLN
40.0000 mg | SUBCUTANEOUS | Status: DC
  Administered 2020-09-14 – 2020-09-15 (×2): 40 mg via SUBCUTANEOUS
  Filled 2020-09-13 (×2): qty 0.4

## 2020-09-13 MED ORDER — AZITHROMYCIN 250 MG PO TABS
500.0000 mg | ORAL_TABLET | Freq: Every day | ORAL | Status: AC
  Filled 2020-09-13: qty 1

## 2020-09-13 MED ORDER — CLOTRIMAZOLE 1 % EX CREA
TOPICAL_CREAM | Freq: Every day | CUTANEOUS | Status: DC
  Administered 2020-09-16: 1 via TOPICAL
  Filled 2020-09-13: qty 15

## 2020-09-13 MED ORDER — ACETAMINOPHEN 500 MG PO TABS
1000.0000 mg | ORAL_TABLET | Freq: Once | ORAL | Status: AC
  Administered 2020-09-13: 1000 mg via ORAL
  Filled 2020-09-13: qty 2

## 2020-09-13 MED ORDER — DIPHENHYDRAMINE HCL 50 MG/ML IJ SOLN
50.0000 mg | Freq: Once | INTRAMUSCULAR | Status: AC
  Administered 2020-09-14: 50 mg via INTRAVENOUS

## 2020-09-13 MED ORDER — ACETAMINOPHEN 325 MG PO TABS: 650.0000 mg | ORAL_TABLET | Freq: Four times a day (QID) | ORAL | Status: DC | PRN

## 2020-09-13 NOTE — ED Triage Notes (Addendum)
Pt presents to ED via EMS from [facility] with c/o of resp distress. EMS states 83% on chronic 4L.min 02.

## 2020-09-13 NOTE — ED Provider Notes (Signed)
[facility] Emergency Department Provider Note  Time seen: 1:14 AM  I have reviewed the triage vital signs and the nursing notes.   HISTORY  Chief Complaint Shortness of Breath   HPI Dylan Knight is a 86 y.o. male with a past medical history of CHF, COPD, hypertension, hyperlipidemia, diabetes, prior stroke, presents to the emergency department for bilateral leg cramping.  According to the patient he is experiencing severe bilateral lower extremity cramping and discomfort.  Patient denies any shortness of breath, states he has had a slight cough.  Arrives hypoxic on room air in the 80s placed on 4 L and satting in the low 90s.  I reviewed the patient's records including recent discharge summary from 3 days ago.  Patient was discharged to the hospital after an admission for CHF exacerbation requiring significant diuresis.  Patient was discharged on 4 L nasal O2 to a skilled nursing facility.   Past Medical History:  Diagnosis Date  . Arthritis    left hip  . BPH (benign prostatic hyperplasia)   . Cancer [facility])    Skin cancers removed  . Carotid artery occlusion   . CHF (congestive heart failure) ([place])   . COPD (chronic obstructive pulmonary disease) ([place])   . Diabetes mellitus without complication ([place])   . Hyperlipidemia   . Hypertension   . Hypothyroidism   . Macular degeneration   . Peripheral arterial disease ([place]) 2002  . Pulmonary fibrosis ([place])   . Stroke [facility]) 2005    Patient Active Problem List   Diagnosis Date Noted  . Acute congestive heart failure ([place]) 09/03/2020  . ILD (interstitial lung disease) ([place]) 07/23/2020  . Acute respiratory failure with hypoxia ([place])   . Dyspnea 07/19/2020  . CHF exacerbation ([place]) 07/19/2020  . Edema 05/26/2020  . ETD (eustachian tube dysfunction) 04/24/2020  . Insomnia 10/15/2019  . Medicare annual wellness visit, subsequent 07/17/2019  . Tinea cruris 02/12/2019  . Urinary retention 02/10/2019  .  Dysuria 02/07/2019  . Restlessness 01/23/2019  . At risk for falls 07/21/2018  . Laceration of left hand 11/13/2017  . Gait abnormality 05/19/2017  . Hyperlipemia 03/29/2017  . HTN (hypertension) 03/29/2017  . Advance care planning 02/25/2017  . Macular degeneration 02/25/2017  . Transient speech disturbance   . Bilateral carotid artery stenosis   . History of stroke   . TIA (transient ischemic attack) 01/30/2017  . Acute ischemic stroke ([place]) 01/30/2017  . Low back pain with right-sided sciatica 12/01/2016  . Diabetes mellitus type 2, controlled ([place]) 12/01/2016  . CAD (coronary artery disease) 12/01/2016  . Hypothyroidism 12/01/2016  . Back pain 11/02/2016  . Aftercare following surgery of the circulatory system, [place] 06/28/2013  . Cough 04/18/2013  . COPD (chronic obstructive pulmonary disease) ([place] ) 11/22/2012  . Occlusion and stenosis of carotid artery without mention of cerebral infarction 06/29/2012  . Peripheral vascular disease ([place]) 12/30/2011    Past Surgical History:  Procedure Laterality Date  . ABDOMINAL AORTIC ANEURYSM REPAIR  2003   Aorto-left femoral-right iliac BPG by Dr. Scot Dock  . Aortic aneursym surgery     2003- Dr. Scot Dock  . BACK SURGERY     1960  . CAROTID ENDARTERECTOMY  Jan. 25,2007   Right CEA  . Carotid stenosis surgery on right     2007  . CARPAL TUNNEL RELEASE Left 06/02/2016   Procedure: LEFT CARPAL TUNNEL RELEASE;  Surgeon: Daryll Brod, MD;  Location: [place];  Service: Orthopedics;  Laterality: Left;  .  EYE SURGERY     Catarct surgery and lens implant - bilateral  . PR VEIN BYPASS GRAFT,AORTO-FEM-POP  2005   Revision Right Fem-pop  . PR VEIN BYPASS GRAFT,AORTO-FEM-POP  2002   Right Fem-pop  . RADIOLOGY WITH ANESTHESIA N/A 12/02/2016   Procedure: MRI LUMBAR SPINE WITHOUT;  Surgeon: Medication Radiologist, MD;  Location: [place];  Service: Radiology;  Laterality: N/A;  . Right leg bypass     2002 -  and amputation of right  fifth toe  . SPINE SURGERY  1960   lumbar spine  . [place], 2007   cervical spine  . TOE AMPUTATION  08-2001   right 5th toe amputation    Prior to Admission medications   Medication Sig Start Date End Date Taking? Authorizing Provider  acetaminophen (TYLENOL) 325 MG tablet Take 650 mg by mouth every 6 (six) hours as needed for mild pain.     [provider]  albuterol (VENTOLIN HFA) 108 (90 Base) MCG/ACT inhaler Inhale 2 puffs into the lungs every 6 (six) hours as needed for wheezing or shortness of breath. 07/24/20   Dwyane Dee, MD  atenolol (TENORMIN) 50 MG tablet Take 1 tablet (50 mg total) by mouth daily. 07/25/20   Dwyane Dee, MD  clopidogrel (PLAVIX) 75 MG tablet TAKE 1 TABLET (75MG ) BY MOUTH EVERY DAY 12/08/19   Tonia Ghent, MD  clotrimazole-betamethasone (LOTRISONE) cream APPLY TO AFFECTED AREA TWICE A DAY 06/04/20   Tonia Ghent, MD  ezetimibe-simvastatin (VYTORIN) 10-40 MG tablet TAKE 1 TABLET BY MOUTH EVERY DAY 05/19/20   Tonia Ghent, MD  gabapentin (NEURONTIN) 100 MG capsule Take 2 capsules (200 mg total) by mouth 3 (three) times daily. 09/09/20   Swayze, Ava, DO  guaiFENesin (MUCINEX) 600 MG 12 hr tablet Take 600 mg by mouth 2 (two) times daily.     [provider]  HYDROcodone-acetaminophen (NORCO/VICODIN) 5-325 MG tablet Take 1 tablet by mouth every 4 (four) hours as needed for severe pain. 09/09/20   Swayze, Ava, DO  levothyroxine (SYNTHROID) 25 MCG tablet Take 2 tablets (50 mcg total) by mouth daily before breakfast. 03/29/20   Tonia Ghent, MD  Multiple Vitamins-Minerals (CENTRUM SILVER PO) Take 1 tablet by mouth daily.    [provider]  potassium chloride SA (KLOR-CON) 20 MEQ tablet Take 1 tablet (20 mEq total) by mouth 2 (two) times daily. 09/09/20   Swayze, Ava, DO  sitaGLIPtin-metformin (JANUMET) 50-500 MG tablet Take 1 tablet by mouth daily.    [provider]  torsemide (DEMADEX) 20 MG tablet Take 1  tablet (20 mg total) by mouth daily. 09/09/20   Swayze, Ava, DO    Allergies  Allergen Reactions  . Iohexol Hives and Other (See Comments)    Went away with benadryl   . Iodine Rash    Family History  Problem Relation Age of Onset  . Stroke Mother   . Hypertension Mother   . Heart disease Father   . Heart attack Father   . Cancer Brother   . Hypertension Brother     Social History Social History   Tobacco Use  . Smoking status: Former Smoker    Packs/day: 1.00    Years: 63.00    Pack years: 63.00    Types: Cigarettes    Quit date: 04/09/2004    Years since quitting: 16.4  . Smokeless tobacco: Former Systems developer  . Tobacco comment: Quit smoking in 2005  Vaping Use  . Vaping  Use: Never used  Substance Use Topics  . Alcohol use: No    Alcohol/week: 0.0 standard drinks  . Drug use: No    Review of Systems Constitutional: Negative for fever. Cardiovascular: Negative for chest pain. Respiratory: Negative for shortness of breath.  Occasional cough. Gastrointestinal: Negative for abdominal pain, vomiting Musculoskeletal: Bilateral leg cramps. Neurological: Negative for headache All other ROS negative  ____________________________________________   PHYSICAL EXAM:  VITAL SIGNS: ED Triage Vitals  Enc Vitals Group     BP 09/13/20 0105 139/77     Pulse Rate 09/13/20 0105 94     Resp 09/13/20 0105 19     Temp 09/13/20 0105 97.6 F (36.4 C)     Temp Source 09/13/20 0105 Oral     SpO2 09/13/20 0105 93 %     Weight --      Height --      Head Circumference --      Peak Flow --      Pain Score 09/13/20 0110 10     Pain Loc --      Pain Edu? --      Excl. in [place]? --     Constitutional: Alert and oriented x3.  Keeps eyes closed for most of the exam.  Holding bilateral thighs complaining of cramping. Eyes: Normal exam ENT      Head: Normocephalic and atraumatic.      Mouth/Throat: Mucous membranes are moist. Cardiovascular: Normal rate, regular rhythm.  Respiratory:  Normal respiratory effort without tachypnea nor retractions. Breath sounds are clear Gastrointestinal: Soft and nontender. No distention.   Musculoskeletal: Nontender with normal range of motion in all extremities.  No significant tenderness to palpation.  The patient is complaining of cramping to the bilateral thighs.  Appear to be neurovascular intact distally with warm extremities. Neurologic:  Normal speech and language. No gross focal neurologic deficits Skin:  Skin is warm, dry and intact.  Psychiatric: Mood and affect are normal.  ____________________________________________   RADIOLOGY  Chest x-ray is negative for acute abnormality  ____________________________________________   INITIAL IMPRESSION / ASSESSMENT AND PLAN / ED COURSE  Pertinent labs & imaging results that were available during my care of the patient were reviewed by me and considered in my medical decision making (see chart for details).   Patient presents to the emergency department for bilateral leg cramping from his nursing facility.  I reviewed the patient's records he was recently discharged in the hospital 3 days ago after an admission for CHF/fluid overload with significant IV diuresis.  Cramping could very likely be related to electrolyte/metabolic abnormality as well as overdiuresis/dehydration.  We will IV hydrate with 500 cc of normal saline given his CHF history.  We will check labs including chemistry and magnesium.  We will also dose Tylenol and continue to closely monitor.  Vital signs are reassuring on 4 L.  Overall patient appears well.  Chest x-ray negative for acute abnormality.  Patient appears much better.  No longer complaining of pain in his legs after IV hydration.  Highly suspect overdiuresis/dehydration leading to cramping.  Remainder the lab work is largely nonrevealing besides mild increased renal insufficiency compared to baseline.  Patient will be discharged back to his nursing  facility.  RONTAE INGLETT was evaluated in Emergency Department on 09/13/2020 for the symptoms described in the history of present illness. He was evaluated in the context of the global COVID-19 pandemic, which necessitated consideration that the patient might be at risk for infection with  the SARS-CoV-2 virus that causes COVID-19. Institutional protocols and algorithms that pertain to the evaluation of patients at risk for COVID-19 are in a state of rapid change based on information released by regulatory bodies including the CDC and federal and state organizations. These policies and algorithms were followed during the patient's care in the ED.  ____________________________________________   FINAL CLINICAL IMPRESSION(S) / ED DIAGNOSES  Leg cramps Dehydration   Harvest Dark, MD 09/13/20 [phone]

## 2020-09-13 NOTE — H&P (Signed)
History and Physical    DOYLE KUNATH WLS:[id number] DOB: 1924-07-22 DOA: 09/13/2020  Referring MD/NP/PA:   PCP: Tonia Ghent, MD   Patient coming from:  The patient is coming from SNF.  At baseline, pt is dependent for most of ADL.        Chief Complaint: SOB  HPI: Dylan Knight is a 86 y.o. male with medical history significant of hypertension, hyperlipidemia, diabetes mellitus, COPD, stroke, hypothyroidism, pulmonary fibrosis, PVD, dCHF, CKD-III, BPH, skin cancer, CAD, who presents with shortness breath.  Pt was found to have worsening shortness of breath and respiratory distress.  Patient has cough, does not seem to have chest pain.  Patient was found to have oxygen desaturation to 83% on his home level 4L of oxygen initally.  Patient was given DuoNeb in route with improvement in his breathing however he is still satting in the low 90s.  No fever or chills.  Denies nausea, vomiting, diarrhea or abdominal pain no symptoms of UTI. Pt has increased confusion. Patient is orientated to the place and knows his own name, but is confused about time.  Patient moves all extremities normally.  No facial droop or slurred speech.  Patient has leg cramps and discomfort in both legs.  Of note, patient was recently hospitalized from 10/26-11/11/2019 due to CHF exacerbation.  Patient had negative CT angiogram for PE on 10/06/2020.  Negative lower extremity Doppler for DVT on 10/08/2020.   ED Course: pt was found to have WBC 10.9, BNP 810, negative Covid PCR, stable renal function, temperature 96.9, blood pressure 146/87, heart rate 107, RR 28, chest x-ray showed stable cardiomegaly and interstitial lung disease without obvious infiltration. Pt is admitted to progressive bed as inpatient.  Review of Systems:   General: no fevers, chills, no body weight gain,  has fatigue HEENT: no blurry vision, hearing changes or sore throat Respiratory: dyspnea, coughing CV: no chest pain, no palpitations GI:  no nausea, vomiting, abdominal pain, diarrhea, constipation GU: no dysuria, burning on urination, increased urinary frequency, hematuria  Ext: no leg edema Neuro: no unilateral weakness, numbness, or tingling, no vision change or hearing loss. Has confusion Skin: no rash, no skin tear. MSK: No muscle spasm, no deformity, no limitation of range of movement in spin Heme: No easy bruising.  Travel history: No recent long distant travel.  Allergy:  Allergies  Allergen Reactions  . Iohexol Hives and Other (See Comments)    Went away with benadryl   . Iodine Rash    Past Medical History:  Diagnosis Date  . Arthritis    left hip  . BPH (benign prostatic hyperplasia)   . Cancer [facility])    Skin cancers removed  . Carotid artery occlusion   . CHF (congestive heart failure) ([place])   . COPD (chronic obstructive pulmonary disease) ([place])   . Diabetes mellitus without complication ([place])   . Hyperlipidemia   . Hypertension   . Hypothyroidism   . Macular degeneration   . Peripheral arterial disease ([place]) 2002  . Pulmonary fibrosis ([place])   . Stroke [facility]) 2005    Past Surgical History:  Procedure Laterality Date  . ABDOMINAL AORTIC ANEURYSM REPAIR  2003   Aorto-left femoral-right iliac BPG by Dr. Scot Dock  . Aortic aneursym surgery     2003- Dr. Scot Dock  . BACK SURGERY     1960  . CAROTID ENDARTERECTOMY  Jan. 25,2007   Right CEA  . Carotid stenosis surgery on right     2007  .  CARPAL TUNNEL RELEASE Left 06/02/2016   Procedure: LEFT CARPAL TUNNEL RELEASE;  Surgeon: Daryll Brod, MD;  Location: [place];  Service: Orthopedics;  Laterality: Left;  . EYE SURGERY     Catarct surgery and lens implant - bilateral  . PR VEIN BYPASS GRAFT,AORTO-FEM-POP  2005   Revision Right Fem-pop  . PR VEIN BYPASS GRAFT,AORTO-FEM-POP  2002   Right Fem-pop  . RADIOLOGY WITH ANESTHESIA N/A 12/02/2016   Procedure: MRI LUMBAR SPINE WITHOUT;  Surgeon: Medication Radiologist, MD;  Location: [place];  Service: Radiology;  Laterality: N/A;  . Right leg bypass     2002 -  and amputation of right fifth toe  . SPINE SURGERY  1960   lumbar spine  . [place], 2007   cervical spine  . TOE AMPUTATION  08-2001   right 5th toe amputation    Social History:  reports that he quit smoking about 16 years ago. His smoking use included cigarettes. He has a 63.00 pack-year smoking history. He has quit using smokeless tobacco. He reports that he does not drink alcohol and does not use drugs.  Family History:  Family History  Problem Relation Age of Onset  . Stroke Mother   . Hypertension Mother   . Heart disease Father   . Heart attack Father   . Cancer Brother   . Hypertension Brother      Prior to Admission medications   Medication Sig Start Date End Date Taking? Authorizing Provider  acetaminophen (TYLENOL) 325 MG tablet Take 650 mg by mouth every 6 (six) hours as needed for mild pain.     [provider]  albuterol (VENTOLIN HFA) 108 (90 Base) MCG/ACT inhaler Inhale 2 puffs into the lungs every 6 (six) hours as needed for wheezing or shortness of breath. 07/24/20   Dwyane Dee, MD  atenolol (TENORMIN) 50 MG tablet Take 1 tablet (50 mg total) by mouth daily. 07/25/20   Dwyane Dee, MD  clopidogrel (PLAVIX) 75 MG tablet TAKE 1 TABLET (75MG ) BY MOUTH EVERY DAY 12/08/19   Tonia Ghent, MD  clotrimazole-betamethasone (LOTRISONE) cream APPLY TO AFFECTED AREA TWICE A DAY 06/04/20   Tonia Ghent, MD  ezetimibe-simvastatin (VYTORIN) 10-40 MG tablet TAKE 1 TABLET BY MOUTH EVERY DAY 05/19/20   Tonia Ghent, MD  gabapentin (NEURONTIN) 100 MG capsule Take 2 capsules (200 mg total) by mouth 3 (three) times daily. 09/09/20   Swayze, Ava, DO  guaiFENesin (MUCINEX) 600 MG 12 hr tablet Take 600 mg by mouth 2 (two) times daily.     [provider]  HYDROcodone-acetaminophen (NORCO/VICODIN) 5-325 MG tablet Take 1 tablet by mouth every 4 (four) hours as needed for  severe pain. 09/09/20   Swayze, Ava, DO  levothyroxine (SYNTHROID) 25 MCG tablet Take 2 tablets (50 mcg total) by mouth daily before breakfast. 03/29/20   Tonia Ghent, MD  Multiple Vitamins-Minerals (CENTRUM SILVER PO) Take 1 tablet by mouth daily.    [provider]  potassium chloride SA (KLOR-CON) 20 MEQ tablet Take 1 tablet (20 mEq total) by mouth 2 (two) times daily. 09/09/20   Swayze, Ava, DO  sitaGLIPtin-metformin (JANUMET) 50-500 MG tablet Take 1 tablet by mouth daily.    [provider]  torsemide (DEMADEX) 20 MG tablet Take 1 tablet (20 mg total) by mouth daily. 09/09/20   Swayze, Ava, DO    Physical Exam: Vitals:   09/13/20 1151  BP: (!) 146/87  Pulse: 78  Temp: Marland Kitchen)  96.9 F (36.1 C)  TempSrc: Axillary  SpO2: 94%   General: Not in acute distress HEENT:       Eyes: PERRL, EOMI, no scleral icterus.       ENT: No discharge from the ears and nose, no pharynx injection, no tonsillar enlargement.        Neck: No JVD, no bruit, no mass felt. Heme: No neck lymph node enlargement. Cardiac: S1/S2, RRR, No murmurs, No gallops or rubs. Respiratory: Diffuse rhonchi bilaterally GI: Soft, nondistended, nontender, no rebound pain, no organomegaly, BS present. GU: No hematuria Ext: No pitting leg edema bilaterally. 2+DP/PT pulse bilaterally. Musculoskeletal: No joint deformities, No joint redness or warmth, no limitation of ROM in spin. Skin: No rashes.  Neuro: Patient is orientated to the place and knows his own name, but is confused about time. cranial nerves II-XII grossly intact, moves all extremities normally Psych: Patient is not psychotic, no suicidal or hemocidal ideation.  Labs on Admission: I have personally reviewed following labs and imaging studies  CBC: Recent Labs  Lab 09/08/20 0338 09/13/20 0109 09/13/20 1214  WBC 6.7 12.5* 10.9*  NEUTROABS 5.0  --  8.8*  HGB 15.0 15.9 16.1  HCT 43.9 46.2 46.4  MCV 102.1* 102.2* 102.2*  PLT 158 234 202    Basic Metabolic Panel: Recent Labs  Lab 09/07/20 0521 09/08/20 0338 09/09/20 0453 09/13/20 0109 09/13/20 1214  NA 136 133* 134* 131* 134*  K 3.3* 4.5 4.7 4.2 4.1  CL 99 98 94* 91* 96*  CO2 29 27 32 27 27  GLUCOSE 153* 155* 157* 180* 158*  BUN 28* 27* 30* 32* 26*  CREATININE 1.20 1.10 1.26* 1.43* 1.31*  CALCIUM 8.7* 8.8* 9.1 10.0 9.5  MG  --   --   --  2.3  --    GFR: Estimated Creatinine Clearance: 31 mL/min (A) (by C-G formula based on SCr of 1.31 mg/dL (H)). Liver Function Tests: Recent Labs  Lab 09/13/20 0109  AST 37  ALT 31  ALKPHOS 91  BILITOT 1.7*  PROT 7.0  ALBUMIN 3.9   No results for input(s): LIPASE, AMYLASE in the last 168 hours. No results for input(s): AMMONIA in the last 168 hours. Coagulation Profile: No results for input(s): INR, PROTIME in the last 168 hours. Cardiac Enzymes: No results for input(s): CKTOTAL, CKMB, CKMBINDEX, TROPONINI in the last 168 hours. BNP (last 3 results) Recent Labs    08/01/20 1122  PROBNP 1,229.0*   HbA1C: No results for input(s): HGBA1C in the last 72 hours. CBG: Recent Labs  Lab 09/08/20 1152 09/08/20 1631 09/08/20 2111 09/09/20 0736 09/09/20 1123  GLUCAP 189* 122* 184* 137* 140*   Lipid Profile: No results for input(s): CHOL, HDL, LDLCALC, TRIG, CHOLHDL, LDLDIRECT in the last 72 hours. Thyroid Function Tests: No results for input(s): TSH, T4TOTAL, FREET4, T3FREE, THYROIDAB in the last 72 hours. Anemia Panel: No results for input(s): VITAMINB12, FOLATE, FERRITIN, TIBC, IRON, RETICCTPCT in the last 72 hours. Urine analysis:    Component Value Date/Time   COLORURINE YELLOW (A) 07/19/2020 1956   APPEARANCEUR CLEAR (A) 07/19/2020 1956   APPEARANCEUR Clear 11/24/2014 1359   LABSPEC 1.008 07/19/2020 1956   LABSPEC 1.008 11/24/2014 1359   PHURINE 7.0 07/19/2020 1956   GLUCOSEU NEGATIVE 07/19/2020 1956   GLUCOSEU Negative 11/24/2014 1359   HGBUR NEGATIVE 07/19/2020 [place] NEGATIVE 07/19/2020  1956   BILIRUBINUR Negative 11/24/2014 [place] 07/19/2020 1956   PROTEINUR NEGATIVE 07/19/2020 1956  NITRITE NEGATIVE 07/19/2020 1956   LEUKOCYTESUR NEGATIVE 07/19/2020 1956   LEUKOCYTESUR Negative 11/24/2014 1359   Sepsis Labs: @LABRCNTIP (procalcitonin:4,lacticidven:4) ) Recent Results (from the past 240 hour(s))  Respiratory Panel by RT PCR (Flu A&B, Covid) - Nasopharyngeal Swab     Status: None   Collection Time: 09/03/20 10:25 PM   Specimen: Nasopharyngeal Swab  Result Value Ref Range Status   SARS Coronavirus 2 by RT PCR NEGATIVE NEGATIVE Final    Comment: (NOTE) SARS-CoV-2 target nucleic acids are NOT DETECTED.  The SARS-CoV-2 RNA is generally detectable in upper respiratoy specimens during the acute phase of infection. The lowest concentration of SARS-CoV-2 viral copies this assay can detect is 131 copies/mL. A negative result does not preclude SARS-Cov-2 infection and should not be used as the sole basis for treatment or other patient management decisions. A negative result may occur with  improper specimen collection/handling, submission of specimen other than nasopharyngeal swab, presence of viral mutation(s) within the areas targeted by this assay, and inadequate number of viral copies (<131 copies/mL). A negative result must be combined with clinical observations, patient history, and epidemiological information. The expected result is Negative.  Fact Sheet for Patients:  PinkCheek.be  Fact Sheet for Healthcare Providers:  GravelBags.it  This test is no t yet approved or cleared by the [place] FDA and  has been authorized for detection and/or diagnosis of SARS-CoV-2 by FDA under an Emergency Use Authorization (EUA). This EUA will remain  in effect (meaning this test can be used) for the duration of the COVID-19 declaration under Section 564(b)(1) of the Act, 21 U.S.C. section  360bbb-3(b)(1), unless the authorization is terminated or revoked sooner.     Influenza A by PCR NEGATIVE NEGATIVE Final   Influenza B by PCR NEGATIVE NEGATIVE Final    Comment: (NOTE) The Xpert Xpress SARS-CoV-2/FLU/RSV assay is intended as an aid in  the diagnosis of influenza from Nasopharyngeal swab specimens and  should not be used as a sole basis for treatment. Nasal washings and  aspirates are unacceptable for Xpert Xpress SARS-CoV-2/FLU/RSV  testing.  Fact Sheet for Patients: PinkCheek.be  Fact Sheet for Healthcare Providers: GravelBags.it  This test is not yet approved or cleared by the [place] FDA and  has been authorized for detection and/or diagnosis of SARS-CoV-2 by  FDA under an Emergency Use Authorization (EUA). This EUA will remain  in effect (meaning this test can be used) for the duration of the  Covid-19 declaration under Section 564(b)(1) of the Act, 21  U.S.C. section 360bbb-3(b)(1), unless the authorization is  terminated or revoked. Performed at [facility], [address] [place], [place] [address]   SARS Coronavirus 2 by RT PCR (hospital order, performed in [facility] hospital lab) Nasopharyngeal Nasopharyngeal Swab     Status: None   Collection Time: 09/05/20  2:50 PM   Specimen: Nasopharyngeal Swab  Result Value Ref Range Status   SARS Coronavirus 2 NEGATIVE NEGATIVE Final    Comment: (NOTE) SARS-CoV-2 target nucleic acids are NOT DETECTED.  The SARS-CoV-2 RNA is generally detectable in upper and lower respiratory specimens during the acute phase of infection. The lowest concentration of SARS-CoV-2 viral copies this assay can detect is 250 copies / mL. A negative result does not preclude SARS-CoV-2 infection and should not be used as the sole basis for treatment or other patient management decisions.  A negative result may occur with improper specimen collection /  handling, submission of specimen other than nasopharyngeal swab, presence of  viral mutation(s) within the areas targeted by this assay, and inadequate number of viral copies (<250 copies / mL). A negative result must be combined with clinical observations, patient history, and epidemiological information.  Fact Sheet for Patients:   StrictlyIdeas.no  Fact Sheet for Healthcare Providers: BankingDealers.co.za  This test is not yet approved or  cleared by the [place] FDA and has been authorized for detection and/or diagnosis of SARS-CoV-2 by FDA under an Emergency Use Authorization (EUA).  This EUA will remain in effect (meaning this test can be used) for the duration of the COVID-19 declaration under Section 564(b)(1) of the Act, 21 U.S.C. section 360bbb-3(b)(1), unless the authorization is terminated or revoked sooner.  Performed at [facility], [address] [place], [place] [address]   SARS Coronavirus 2 by RT PCR (hospital order, performed in [facility] hospital lab) Nasopharyngeal Nasopharyngeal Swab     Status: None   Collection Time: 09/09/20 12:24 PM   Specimen: Nasopharyngeal Swab  Result Value Ref Range Status   SARS Coronavirus 2 NEGATIVE NEGATIVE Final    Comment: (NOTE) SARS-CoV-2 target nucleic acids are NOT DETECTED.  The SARS-CoV-2 RNA is generally detectable in upper and lower respiratory specimens during the acute phase of infection. The lowest concentration of SARS-CoV-2 viral copies this assay can detect is 250 copies / mL. A negative result does not preclude SARS-CoV-2 infection and should not be used as the sole basis for treatment or other patient management decisions.  A negative result may occur with improper specimen collection / handling, submission of specimen other than nasopharyngeal swab, presence of viral mutation(s) within the areas targeted by this assay, and inadequate number of  viral copies (<250 copies / mL). A negative result must be combined with clinical observations, patient history, and epidemiological information.  Fact Sheet for Patients:   StrictlyIdeas.no  Fact Sheet for Healthcare Providers: BankingDealers.co.za  This test is not yet approved or  cleared by the [place] FDA and has been authorized for detection and/or diagnosis of SARS-CoV-2 by FDA under an Emergency Use Authorization (EUA).  This EUA will remain in effect (meaning this test can be used) for the duration of the COVID-19 declaration under Section 564(b)(1) of the Act, 21 U.S.C. section 360bbb-3(b)(1), unless the authorization is terminated or revoked sooner.  Performed at [facility], [address] [place], [place] [address]   Respiratory Panel by RT PCR (Flu A&B, Covid) - Nasopharyngeal Swab     Status: None   Collection Time: 09/13/20 12:15 PM   Specimen: Nasopharyngeal Swab  Result Value Ref Range Status   SARS Coronavirus 2 by RT PCR NEGATIVE NEGATIVE Final    Comment: (NOTE) SARS-CoV-2 target nucleic acids are NOT DETECTED.  The SARS-CoV-2 RNA is generally detectable in upper respiratoy specimens during the acute phase of infection. The lowest concentration of SARS-CoV-2 viral copies this assay can detect is 131 copies/mL. A negative result does not preclude SARS-Cov-2 infection and should not be used as the sole basis for treatment or other patient management decisions. A negative result may occur with  improper specimen collection/handling, submission of specimen other than nasopharyngeal swab, presence of viral mutation(s) within the areas targeted by this assay, and inadequate number of viral copies (<131 copies/mL). A negative result must be combined with clinical observations, patient history, and epidemiological information. The expected result is Negative.  Fact Sheet for Patients:   PinkCheek.be  Fact Sheet for Healthcare Providers:  GravelBags.it  This test is no t  yet approved or cleared by the [place] and  has been authorized for detection and/or diagnosis of SARS-CoV-2 by FDA under an Emergency Use Authorization (EUA). This EUA will remain  in effect (meaning this test can be used) for the duration of the COVID-19 declaration under Section 564(b)(1) of the Act, 21 U.S.C. section 360bbb-3(b)(1), unless the authorization is terminated or revoked sooner.     Influenza A by PCR NEGATIVE NEGATIVE Final   Influenza B by PCR NEGATIVE NEGATIVE Final    Comment: (NOTE) The Xpert Xpress SARS-CoV-2/FLU/RSV assay is intended as an aid in  the diagnosis of influenza from Nasopharyngeal swab specimens and  should not be used as a sole basis for treatment. Nasal washings and  aspirates are unacceptable for Xpert Xpress SARS-CoV-2/FLU/RSV  testing.  Fact Sheet for Patients: PinkCheek.be  Fact Sheet for Healthcare Providers: GravelBags.it  This test is not yet approved or cleared by the [place] FDA and  has been authorized for detection and/or diagnosis of SARS-CoV-2 by  FDA under an Emergency Use Authorization (EUA). This EUA will remain  in effect (meaning this test can be used) for the duration of the  Covid-19 declaration under Section 564(b)(1) of the Act, 21  U.S.C. section 360bbb-3(b)(1), unless the authorization is  terminated or revoked. Performed at [facility], [address] [place], [place] [address]      Radiological Exams on Admission: DG Chest [facility] 1 View  Result Date: 09/13/2020 CLINICAL DATA:  Increased shortness of breath and confusion. EXAM: PORTABLE CHEST 1 VIEW COMPARISON:  09/13/2020 FINDINGS: The cardiac silhouette remains borderline enlarged. Stable prominence of the interstitial markings. No  Kerley lines or pleural fluid. Stable mild scoliosis. IMPRESSION: No acute abnormality. Stable borderline cardiomegaly and chronic interstitial lung disease. Electronically Signed   By: Claudie Revering M.D.   On: 09/13/2020 12:59   DG Chest Portable 1 View  Result Date: 09/13/2020 CLINICAL DATA:  Cough EXAM: PORTABLE CHEST 1 VIEW COMPARISON:  09/03/2020 FINDINGS: The heart is normal size. Improved bibasilar opacities. Bibasilar scarring/fibrosis. No effusions or acute bony abnormality. IMPRESSION: Chronic changes in the lung bases compatible with scarring/fibrosis. Improved opacity at the right lung base, likely resolved superimposed infection. Electronically Signed   By: Rolm Baptise M.D.   On: 09/13/2020 01:24     EKG: I have personally reviewed.  Sinus rhythm, QTC 476, low voltage, poor R wave progression, anteroseptal infarction pattern, T wave inversion in V3-V4.  Assessment/Plan Principal Problem:   COPD exacerbation (HCC) Active Problems:   Diabetes mellitus type 2, controlled ([place])   Hypothyroidism   History of stroke   Hyperlipemia   HTN (hypertension)   ILD (interstitial lung disease) (HCC)   Chronic diastolic CHF (congestive heart failure) (HCC)   Acute metabolic encephalopathy   Acute on chronic respiratory failure with hypoxia (HCC)   CKD (chronic kidney disease), stage IIIa   Acute on chronic respiratory failure with hypoxia due to COPD exacerbation in the setting of  ILD (interstitial lung disease): Patient had negative CT angiogram for PE on 10/06/2020.  Negative lower extremity Doppler for DVT on 10/08/2020. Low suspicions for PE.  - will admit to progressive unit as inpatient -Bronchodilators -Solu-Medrol 40 mg IV tid -Z pak  -Mucinex for cough  -Incentive spirometry -Follow up blood culture x2, sputum culture -Nasal cannula oxygen as needed to maintain O2 saturation 93% or greater  Diabetes mellitus type 2, controlled with complication for PVD: Recent A1c 6.2,  well controlled.  Patient  is taking Janumet at home -Sliding scale insulin  Hypothyroidism -Synthroid  History of stroke -Plavix, Vytorin  Hyperlipemia -Vytorin  HTN (hypertension) -IV hydralazine as needed -Atenolol  Chronic diastolic CHF (congestive heart failure) ([place]): 2D echo on 09/05/2020 showed EF of 60 to 65%.  His BNP is 810, no pulmonary edema on chest x-ray.  No leg edema or JVD.  Does not seem to have CHF exacerbation. -continue home torsemide  CKD stage IIIa: Stable -Follow-up renal function with BMP  Acute metabolic encephalopathy: Patient is orientated to the place and knows his own name, confused about time no neuro deficit on physical examination, possibly due to hypoxia -Frequent neuro check  Leg cramps and discomfort in both legs: -Try pramipexole for possible restless leg syndrome     DVT ppx:  SQ Lovenox Code Status: DNR Family Communication:    Yes, patient's wife at bed side Disposition Plan:  Anticipate discharge back to previous SNF environment Consults called:  none Admission status:   progressive unit as inpt      Status is: Inpatient  Remains inpatient appropriate because:Inpatient level of care appropriate due to severity of illness.  Patient has multiple comorbidities, now presents with acute on chronic respiratory failure with hypoxia due to COPD exacerbation in the setting of pulmonary fibrosis and CHF. His presentation is highly complicated.  Patient is at high risk of deteriorating given his old age.  Patient will need to be treated in hospital for at least 2 days.     Dispo: The patient is from: SNF              Anticipated d/c is to: SNF              Anticipated d/c date is: 2 days              Patient currently is not medically stable to d/c.          Date of Service 09/13/2020    Ivor Costa [facility]   If 7PM-7AM, please contact night-coverage [email or web address] 09/13/2020, 1:43 PM

## 2020-09-13 NOTE — ED Triage Notes (Signed)
Pt arrival via [facility] from [facility] due to increased shob, confusion, and leg cramps. Pt is a new resident to [facility] as of around 3 days ago and patient has reportedly been acting different today and has increased confusion. Pt is a&o on arrival and states he is feeling shob. [place] states that they called out because his sats were in the 80's consistently and that he has new 'jerking'.   Pt sats down to around 80 on RA on arrival.

## 2020-09-13 NOTE — ED Provider Notes (Signed)
[facility] Emergency Department Provider Note   ____________________________________________   First MD Initiated Contact with Patient 09/13/20 1151     (approximate)  I have reviewed the triage vital signs and the nursing notes.   HISTORY  Chief Complaint Respiratory Distress    HPI Dylan Knight is a 86 y.o. male with a history of COPD, CHF, type 2 diabetes, and pulmonary fibrosis on 4 L O2 chronically who presents from his skilled nursing facility with complaints of respiratory distress.  EMS state they found him to be 83% on his 4 L by nasal cannula.  Patient was given a DuoNeb in route with significant improvement in his breathing however he is still satting in the low 90s.  Patient only complains of mild shortness of breath         Past Medical History:  Diagnosis Date  . Arthritis    left hip  . BPH (benign prostatic hyperplasia)   . Cancer [facility])    Skin cancers removed  . Carotid artery occlusion   . CHF (congestive heart failure) ([place])   . COPD (chronic obstructive pulmonary disease) ([place])   . Diabetes mellitus without complication ([place])   . Hyperlipidemia   . Hypertension   . Hypothyroidism   . Macular degeneration   . Peripheral arterial disease ([place]) 2002  . Pulmonary fibrosis ([place])   . Stroke [facility]) 2005    Patient Active Problem List   Diagnosis Date Noted  . Chronic diastolic CHF (congestive heart failure) ([place]) 09/13/2020  . Acute metabolic encephalopathy 41/66/0630  . Acute on chronic respiratory failure with hypoxia ([place]) 09/13/2020  . CKD (chronic kidney disease), stage IIIa 09/13/2020  . Acute congestive heart failure ([place]) 09/03/2020  . ILD (interstitial lung disease) ([place]) 07/23/2020  . Acute respiratory failure with hypoxia ([place])   . Dyspnea 07/19/2020  . CHF exacerbation ([place]) 07/19/2020  . Edema 05/26/2020  . ETD (eustachian tube dysfunction) 04/24/2020  . Insomnia 10/15/2019  . Medicare annual wellness  visit, subsequent 07/17/2019  . Tinea cruris 02/12/2019  . Urinary retention 02/10/2019  . Dysuria 02/07/2019  . Restlessness 01/23/2019  . At risk for falls 07/21/2018  . Laceration of left hand 11/13/2017  . Gait abnormality 05/19/2017  . Hyperlipemia 03/29/2017  . HTN (hypertension) 03/29/2017  . Advance care planning 02/25/2017  . Macular degeneration 02/25/2017  . Transient speech disturbance   . Bilateral carotid artery stenosis   . History of stroke   . TIA (transient ischemic attack) 01/30/2017  . Acute ischemic stroke ([place]) 01/30/2017  . Low back pain with right-sided sciatica 12/01/2016  . Diabetes mellitus type 2, controlled ([place]) 12/01/2016  . CAD (coronary artery disease) 12/01/2016  . Hypothyroidism 12/01/2016  . Back pain 11/02/2016  . Aftercare following surgery of the circulatory system, [place] 06/28/2013  . Cough 04/18/2013  . COPD exacerbation ([place]) 11/22/2012  . Occlusion and stenosis of carotid artery without mention of cerebral infarction 06/29/2012  . Peripheral vascular disease ([place]) 12/30/2011    Past Surgical History:  Procedure Laterality Date  . ABDOMINAL AORTIC ANEURYSM REPAIR  2003   Aorto-left femoral-right iliac BPG by Dr. Scot Dock  . Aortic aneursym surgery     2003- Dr. Scot Dock  . BACK SURGERY     1960  . CAROTID ENDARTERECTOMY  Jan. 25,2007   Right CEA  . Carotid stenosis surgery on right     2007  . CARPAL TUNNEL RELEASE Left 06/02/2016   Procedure: LEFT CARPAL TUNNEL RELEASE;  Surgeon: Daryll Brod, MD;  Location: [place];  Service: Orthopedics;  Laterality: Left;  . EYE SURGERY     Catarct surgery and lens implant - bilateral  . PR VEIN BYPASS GRAFT,AORTO-FEM-POP  2005   Revision Right Fem-pop  . PR VEIN BYPASS GRAFT,AORTO-FEM-POP  2002   Right Fem-pop  . RADIOLOGY WITH ANESTHESIA N/A 12/02/2016   Procedure: MRI LUMBAR SPINE WITHOUT;  Surgeon: Medication Radiologist, MD;  Location: [place];  Service: Radiology;   Laterality: N/A;  . Right leg bypass     2002 -  and amputation of right fifth toe  . SPINE SURGERY  1960   lumbar spine  . [place], 2007   cervical spine  . TOE AMPUTATION  08-2001   right 5th toe amputation    Prior to Admission medications   Medication Sig Start Date End Date Taking? Authorizing Provider  acetaminophen (TYLENOL) 325 MG tablet Take 650 mg by mouth every 6 (six) hours as needed for mild pain.    Yes [provider]  albuterol (VENTOLIN HFA) 108 (90 Base) MCG/ACT inhaler Inhale 2 puffs into the lungs every 6 (six) hours as needed for wheezing or shortness of breath. 07/24/20  Yes Dwyane Dee, MD  atenolol (TENORMIN) 50 MG tablet Take 1 tablet (50 mg total) by mouth daily. 07/25/20  Yes Dwyane Dee, MD  baclofen (LIORESAL) 10 MG tablet Take 10 mg by mouth 4 (four) times daily.   Yes [provider]  clopidogrel (PLAVIX) 75 MG tablet TAKE 1 TABLET (75MG ) BY MOUTH EVERY DAY 12/08/19  Yes Tonia Ghent, MD  clotrimazole-betamethasone (LOTRISONE) cream APPLY TO AFFECTED AREA TWICE A DAY 06/04/20  Yes Tonia Ghent, MD  ezetimibe-simvastatin (VYTORIN) 10-40 MG tablet TAKE 1 TABLET BY MOUTH EVERY DAY 05/19/20  Yes Tonia Ghent, MD  gabapentin (NEURONTIN) 100 MG capsule Take 2 capsules (200 mg total) by mouth 3 (three) times daily. 09/09/20  Yes Swayze, Ava, DO  guaiFENesin (MUCINEX) 600 MG 12 hr tablet Take 600 mg by mouth 2 (two) times daily.    Yes [provider]  HYDROcodone-acetaminophen (NORCO/VICODIN) 5-325 MG tablet Take 1 tablet by mouth every 4 (four) hours as needed for severe pain. 09/09/20  Yes Swayze, Ava, DO  ipratropium-albuterol (DUONEB) 0.5-2.5 (3) MG/3ML SOLN Take 3 mLs by nebulization 3 (three) times daily.   Yes [provider]  levothyroxine (SYNTHROID) 25 MCG tablet Take 2 tablets (50 mcg total) by mouth daily before breakfast. 03/29/20  Yes Tonia Ghent, MD  Multiple Vitamins-Minerals (CENTRUM  SILVER PO) Take 1 tablet by mouth daily.   Yes [provider]  potassium chloride SA (KLOR-CON) 20 MEQ tablet Take 1 tablet (20 mEq total) by mouth 2 (two) times daily. 09/09/20  Yes Swayze, Ava, DO  sitaGLIPtin-metformin (JANUMET) 50-500 MG tablet Take 1 tablet by mouth daily.   Yes [provider]  torsemide (DEMADEX) 20 MG tablet Take 1 tablet (20 mg total) by mouth daily. 09/09/20  Yes Swayze, Ava, DO  zinc sulfate 220 (50 Zn) MG capsule Take 220 mg by mouth daily.   Yes [provider]    Allergies Iohexol and Iodine  Family History  Problem Relation Age of Onset  . Stroke Mother   . Hypertension Mother   . Heart disease Father   . Heart attack Father   . Cancer Brother   . Hypertension Brother     Social History Social History   Tobacco Use  .  Smoking status: Former Smoker    Packs/day: 1.00    Years: 63.00    Pack years: 63.00    Types: Cigarettes    Quit date: 04/09/2004    Years since quitting: 16.4  . Smokeless tobacco: Former Systems developer  . Tobacco comment: Quit smoking in 2005  Vaping Use  . Vaping Use: Never used  Substance Use Topics  . Alcohol use: No    Alcohol/week: 0.0 standard drinks  . Drug use: No    Review of Systems Constitutional: No fever/chills Eyes: No visual changes. ENT: No sore throat. Cardiovascular: Denies chest pain. Respiratory: Endorses shortness of breath. Gastrointestinal: No abdominal pain.  No nausea, no vomiting.  No diarrhea. Genitourinary: Negative for dysuria. Musculoskeletal: Negative for acute arthralgias Skin: Negative for rash. Neurological: Negative for headaches, weakness/numbness/paresthesias in any extremity Psychiatric: Negative for suicidal ideation/homicidal ideation   ____________________________________________   PHYSICAL EXAM:  VITAL SIGNS: ED Triage Vitals  Enc Vitals Group     BP      Pulse      Resp      Temp      Temp src      SpO2      Weight      Height      Head  Circumference      Peak Flow      Pain Score      Pain Loc      Pain Edu?      Excl. in [place]?    Constitutional: Alert and oriented. Well appearing and in no acute distress. Eyes: Conjunctivae are normal. PERRL. Head: Atraumatic. Nose: No congestion/rhinnorhea. Mouth/Throat: Mucous membranes are moist. Neck: No stridor Cardiovascular: Grossly normal heart sounds.  Good peripheral circulation. Respiratory: Tachypneic.  Inspiratory and expiratory wheezes over bilateral lung fields.  4 L by nasal cannula Gastrointestinal: Soft and nontender. No distention. Musculoskeletal: No obvious deformities Neurologic:  Normal speech and language. No gross focal neurologic deficits are appreciated. Skin:  Skin is warm and dry. No rash noted. Psychiatric: Mood and affect are normal. Speech and behavior are normal.  ____________________________________________   LABS (all labs ordered are listed, but only abnormal results are displayed)  Labs Reviewed  CBC WITH DIFFERENTIAL/PLATELET - Abnormal; Notable for the following components:      Result Value   WBC 10.9 (*)    MCV 102.2 (*)    MCH 35.5 (*)    Neutro Abs 8.8 (*)    Lymphs Abs 0.4 (*)    Monocytes Absolute 1.5 (*)    All other components within normal limits  BASIC METABOLIC PANEL - Abnormal; Notable for the following components:   Sodium 134 (*)    Chloride 96 (*)    Glucose, Bld 158 (*)    BUN 26 (*)    Creatinine, Ser 1.31 (*)    GFR, Estimated 50 (*)    All other components within normal limits  BRAIN NATRIURETIC PEPTIDE - Abnormal; Notable for the following components:   B Natriuretic Peptide 810.8 (*)    All other components within normal limits  RESPIRATORY PANEL BY RT PCR (FLU A&B, COVID)   ____________________________________________  EKG  ED ECG REPORT I, Naaman Plummer, the attending physician, personally viewed and interpreted this ECG.  Date: 09/13/2020 EKG Time: 1156 Rate: 82 Rhythm: normal sinus rhythm QRS  Axis: normal Intervals: normal ST/T Wave abnormalities: normal Narrative Interpretation: no evidence of acute ischemia  ____________________________________________  RADIOLOGY  ED MD interpretation: Single view portable x-ray of  the chest shows chronic changes in the lung bases compatible with likely fibrosis.  There is an improved opacity from previous x-ray at the right lung base.  This x-ray shows no evidence of acute pneumonia, pneumothorax, or widened mediastinum  Official radiology report(s): DG Chest Port 1 View  Result Date: 09/13/2020 CLINICAL DATA:  Increased shortness of breath and confusion. EXAM: PORTABLE CHEST 1 VIEW COMPARISON:  09/13/2020 FINDINGS: The cardiac silhouette remains borderline enlarged. Stable prominence of the interstitial markings. No Kerley lines or pleural fluid. Stable mild scoliosis. IMPRESSION: No acute abnormality. Stable borderline cardiomegaly and chronic interstitial lung disease. Electronically Signed   By: Claudie Revering M.D.   On: 09/13/2020 12:59   DG Chest Portable 1 View  Result Date: 09/13/2020 CLINICAL DATA:  Cough EXAM: PORTABLE CHEST 1 VIEW COMPARISON:  09/03/2020 FINDINGS: The heart is normal size. Improved bibasilar opacities. Bibasilar scarring/fibrosis. No effusions or acute bony abnormality. IMPRESSION: Chronic changes in the lung bases compatible with scarring/fibrosis. Improved opacity at the right lung base, likely resolved superimposed infection. Electronically Signed   By: Rolm Baptise M.D.   On: 09/13/2020 01:24    ____________________________________________   PROCEDURES  Procedure(s) performed (including Critical Care):  .Critical Care Performed by: Naaman Plummer, MD Authorized by: Naaman Plummer, MD   Critical care provider statement:    Critical care time (minutes):  35   Critical care was necessary to treat or prevent imminent or life-threatening deterioration of the following conditions:  Respiratory failure    Critical care was time spent personally by me on the following activities:  Discussions with consultants, evaluation of patient's response to treatment, examination of patient, ordering and performing treatments and interventions, ordering and review of laboratory studies, ordering and review of radiographic studies, pulse oximetry, re-evaluation of patient's condition, obtaining history from patient or surrogate and review of old charts   I assumed direction of critical care for this patient from another provider in my specialty: no   .1-3 Lead EKG Interpretation Performed by: Naaman Plummer, MD Authorized by: Naaman Plummer, MD     Interpretation: normal     ECG rate:  86   ECG rate assessment: normal     Rhythm: sinus rhythm     Ectopy: none     Conduction: normal       ____________________________________________   INITIAL IMPRESSION / ASSESSMENT AND PLAN / ED COURSE  As part of my medical decision making, I reviewed the following data within the [place] notes reviewed and incorporated, Labs reviewed, EKG interpreted, Old chart reviewed, Radiograph reviewed and Notes from prior ED visits reviewed and incorporated      The patient appears to be suffering from a moderate/severe exacerbation of COPD.  Based on the history, exam, CXR/EKG reviewed by me, and further workup I dont suspect any other emergent cause of this presentation, such as pneumonia, acute coronary syndrome, congestive heart failure, pulmonary embolism, or pneumothorax.  ED Interventions: bronchodilators, steroids, antibiotics, reassess  Reassessment: After treatment, the patients shortness of breath is improving but patient is still requiring supplemental oxygenation  Disposition: Admit      ____________________________________________   FINAL CLINICAL IMPRESSION(S) / ED DIAGNOSES  Final diagnoses:  Acute on chronic respiratory failure with hypoxia (HCC)  COPD exacerbation  (HCC)  Shortness of breath     ED Discharge Orders    None       Note:  This document was prepared using Dragon voice recognition software and  may include unintentional dictation errors.   Naaman Plummer, MD 09/13/20 1438

## 2020-09-13 NOTE — ED Notes (Signed)
Pt placed on male purwick at this time.

## 2020-09-13 NOTE — ED Notes (Signed)
Report to Zach, RN 

## 2020-09-13 NOTE — Progress Notes (Signed)
Patient now on HFNC 50/50

## 2020-09-14 ENCOUNTER — Inpatient Hospital Stay: Payer: Medicare PPO

## 2020-09-14 ENCOUNTER — Encounter: Payer: Self-pay | Admitting: Internal Medicine

## 2020-09-14 LAB — BASIC METABOLIC PANEL
Anion gap: 13 (ref 5–15)
BUN: 33 mg/dL — ABNORMAL HIGH (ref 8–23)
CO2: 24 mmol/L (ref 22–32)
Calcium: 9.3 mg/dL (ref 8.9–10.3)
Chloride: 95 mmol/L — ABNORMAL LOW (ref 98–111)
Creatinine, Ser: 1.41 mg/dL — ABNORMAL HIGH (ref 0.61–1.24)
GFR, Estimated: 46 mL/min — ABNORMAL LOW (ref >60)
Glucose, Bld: 205 mg/dL — ABNORMAL HIGH (ref 70–99)
Potassium: 4 mmol/L (ref 3.5–5.1)
Sodium: 132 mmol/L — ABNORMAL LOW (ref 135–145)

## 2020-09-14 LAB — GLUCOSE, CAPILLARY
Glucose-Capillary: 167 mg/dL — ABNORMAL HIGH (ref 70–99)
Glucose-Capillary: 143 mg/dL — ABNORMAL HIGH (ref 70–99)

## 2020-09-14 LAB — CBC
HCT: 40.6 % (ref 39.0–52.0)
Hemoglobin: 14.3 g/dL (ref 13.0–17.0)
MCH: 35.2 pg — ABNORMAL HIGH (ref 26.0–34.0)
MCHC: 35.2 g/dL (ref 30.0–36.0)
MCV: 100 fL (ref 80.0–100.0)
Platelets: 222 10*3/uL (ref 150–400)
RBC: 4.06 MIL/uL — ABNORMAL LOW (ref 4.22–5.81)
RDW: 14 % (ref 11.5–15.5)
WBC: 11.4 10*3/uL — ABNORMAL HIGH (ref 4.0–10.5)
nRBC: 0 % (ref 0.0–0.2)

## 2020-09-14 LAB — TROPONIN I (HIGH SENSITIVITY): Troponin I (High Sensitivity): 16 ng/L (ref <18)

## 2020-09-14 LAB — CBG MONITORING, ED
Glucose-Capillary: 200 mg/dL — ABNORMAL HIGH (ref 70–99)
Glucose-Capillary: 155 mg/dL — ABNORMAL HIGH (ref 70–99)

## 2020-09-14 MED ORDER — DIPHENHYDRAMINE HCL 50 MG/ML IJ SOLN
50.0000 mg | Freq: Once | INTRAMUSCULAR | Status: AC
  Administered 2020-09-14: 50 mg via INTRAVENOUS

## 2020-09-14 MED ORDER — MIDAZOLAM HCL 2 MG/2ML IJ SOLN
2.0000 mg | INTRAMUSCULAR | Status: AC
  Administered 2020-09-14: 2 mg via INTRAVENOUS
  Filled 2020-09-14: qty 2

## 2020-09-14 MED ORDER — IOHEXOL 350 MG/ML SOLN
75.0000 mL | Freq: Once | INTRAVENOUS | Status: AC | PRN
  Administered 2020-09-14: 75 mL via INTRAVENOUS

## 2020-09-14 MED ORDER — DIPHENHYDRAMINE HCL 50 MG/ML IJ SOLN
INTRAMUSCULAR | Status: AC
  Administered 2020-09-14: 50 mg via INTRAVENOUS
  Filled 2020-09-14: qty 1

## 2020-09-14 MED ORDER — METHOCARBAMOL 500 MG PO TABS
500.0000 mg | ORAL_TABLET | Freq: Three times a day (TID) | ORAL | Status: DC | PRN
  Filled 2020-09-14 (×2): qty 1

## 2020-09-14 MED ORDER — ATENOLOL 25 MG PO TABS
25.0000 mg | ORAL_TABLET | Freq: Every day | ORAL | Status: DC
  Administered 2020-09-15 – 2020-09-16 (×2): 25 mg via ORAL
  Filled 2020-09-14 (×2): qty 1

## 2020-09-14 MED ORDER — DIPHENHYDRAMINE HCL 50 MG/ML IJ SOLN: 50.0000 mg | Freq: Once | INTRAMUSCULAR | Status: AC

## 2020-09-14 MED ORDER — MIDAZOLAM HCL 2 MG/2ML IJ SOLN: 2.0000 mg | Freq: Once | INTRAMUSCULAR | Status: DC

## 2020-09-14 NOTE — Progress Notes (Signed)
PROGRESS NOTE    Dylan AMMON Sr.  OZD:[id number] DOB: Mar 16, 1924 DOA: 09/13/2020 PCP: Tonia Ghent, MD     Brief Narrative:  Dylan Ny Sr. is a 86 y.o. male with medical history significant of hypertension, hyperlipidemia, diabetes mellitus, COPD, stroke, hypothyroidism, pulmonary fibrosis, PVD, dCHF, CKD-III, BPH, skin cancer, CAD, who presents with shortness breath.  Pt was found to have worsening shortness of breath and respiratory distress. Patient was found to have oxygen desaturation to 83% on his home level 4L of oxygen initally. Patient was given DuoNeb in route with improvement in his breathing however he is still satting in the low 90s. Patient was recently hospitalized from 10/26-11/11/2019 due to CHF exacerbation.  Now admitted secondary to COPD exacerbation with respiratory failure.  New events last 24 hours / Subjective: Wife at bedside states the patient has not slept for over 24 hours.  Currently, patient resting soundly in no acute distress.  Assessment & Plan:   Principal Problem:   COPD exacerbation ([place]) Active Problems:   Diabetes mellitus type 2, controlled ([place])   Hypothyroidism   History of stroke   Hyperlipemia   HTN (hypertension)   ILD (interstitial lung disease) (H[facility])   Chronic diastolic CHF (congestive heart failure) (HCC)   Acute metabolic encephalopathy   Acute on chronic respiratory failure with hypoxia (H[facility])   CKD (chronic kidney disease), stage IIIa    Acute on chronic respiratory failure with hypoxia due to COPD exacerbation in the setting of ILD (interstitial lung disease) -CTA with motion artifact, however no suspicious pulmonary artery filling defects to suggest acute PE.  Left upper lobe nodular density, follow-up with noncontrast CT chest in 6 to 12 months -Continue supplemental oxygen to maintain sat 88 to 92% -Continue Solu-Medrol, azithromycin, breathing treatment  Diabetes mellitus type 2, controlled with complication  for PVD -Recent A1c 6.2, well controlled.  Patient is taking Janumet at home -Sliding scale insulin  Hypothyroidism -Synthroid  History of stroke -Plavix, Vytorin  Hyperlipemia -Vytorin  HTN  -IV hydralazine as needed -Atenolol  Chronic diastolic CHF -2D echo on 25/95/6387 showed EF of 60 to 65%.  His BNP is 810, no pulmonary edema on chest x-ray.  No leg edema or JVD.  Does not seem to have CHF exacerbation. -Continue home torsemide  CKD stage IIIa -Baseline creatinine 1.2 -Stable  Acute metabolic encephalopathy -Patient oriented to person and place at the time of admission  Leg cramps and discomfort in both legs -Try pramipexole for possible restless leg syndrome    DVT prophylaxis:  enoxaparin (LOVENOX) injection 40 mg Start: 09/13/20 2200  Code Status: DNR Family Communication: Wife at bedside Disposition Plan:  Status is: Inpatient  Remains inpatient appropriate because:Hemodynamically unstable, Altered mental status, IV treatments appropriate due to intensity of illness or inability to take PO and Inpatient level of care appropriate due to severity of illness   Dispo: The patient is from: SNF              Anticipated d/c is to: SNF              Anticipated d/c date is: 3 days              Patient currently is not medically stable to d/c.  Remains on high flow nasal cannula O2 today.    Consultants:   None  Procedures:   None  Antimicrobials:  Anti-infectives (From admission, onward)   Start     Dose/Rate Route Frequency Ordered Stop  09/15/20 1000  azithromycin (ZITHROMAX) tablet 250 mg       "Followed by" Linked Group Details   250 mg Oral Daily 09/13/20 1321 10-18-20 0959   09/14/20 1000  azithromycin (ZITHROMAX) tablet 500 mg       "Followed by" Linked Group Details   500 mg Oral Daily 09/13/20 1321 09/15/20 0959   09/13/20 1300  azithromycin (ZITHROMAX) 500 mg in sodium chloride 0.9 % 250 mL IVPB  Status:  Discontinued        500  mg 250 mL/hr over 60 Minutes Intravenous  Once 09/13/20 1255 09/13/20 1444        Objective: Vitals:   09/14/20 0830 09/14/20 0900 09/14/20 0935 09/14/20 1000  BP: 97/63 (!) 139/59 104/60 (!) 93/55  Pulse: 76 81 77 76  Resp: 12 (!) 22 13 13   Temp:      TempSrc:      SpO2: 99% 100% 96% 97%   No intake or output data in the 24 hours ending 09/14/20 1032 There were no vitals filed for this visit.  Examination:  General exam: Appears calm and comfortable  Respiratory system: Clear to auscultation anteriorly. Respiratory effort normal. No respiratory distress. On NRB  Cardiovascular system: S1 & S2 heard, RRR. No murmurs. No pedal edema. Gastrointestinal system: Abdomen is nondistended, soft and nontender. Normal bowel sounds heard. Extremities: Symmetric in appearance  Skin: No rashes, lesions or ulcers on exposed skin   Data Reviewed: I have personally reviewed following labs and imaging studies  CBC: Recent Labs  Lab 09/08/20 0338 09/13/20 0109 09/13/20 1214 09/14/20 0653  WBC 6.7 12.5* 10.9* 11.4*  NEUTROABS 5.0  --  8.8*  --   HGB 15.0 15.9 16.1 14.3  HCT 43.9 46.2 46.4 40.6  MCV 102.1* 102.2* 102.2* 100.0  PLT 158 234 216 536   Basic Metabolic Panel: Recent Labs  Lab 09/08/20 0338 09/09/20 0453 09/13/20 0109 09/13/20 1214 09/14/20 0653  NA 133* 134* 131* 134* 132*  K 4.5 4.7 4.2 4.1 4.0  CL 98 94* 91* 96* 95*  CO2 27 32 27 27 24   GLUCOSE 155* 157* 180* 158* 205*  BUN 27* 30* 32* 26* 33*  CREATININE 1.10 1.26* 1.43* 1.31* 1.41*  CALCIUM 8.8* 9.1 10.0 9.5 9.3  MG  --   --  2.3  --   --    GFR: Estimated Creatinine Clearance: 28.8 mL/min (A) (by C-G formula based on SCr of 1.41 mg/dL (H)). Liver Function Tests: Recent Labs  Lab 09/13/20 0109  AST 37  ALT 31  ALKPHOS 91  BILITOT 1.7*  PROT 7.0  ALBUMIN 3.9   No results for input(s): LIPASE, AMYLASE in the last 168 hours. No results for input(s): AMMONIA in the last 168 hours. Coagulation  Profile: No results for input(s): INR, PROTIME in the last 168 hours. Cardiac Enzymes: No results for input(s): CKTOTAL, CKMB, CKMBINDEX, TROPONINI in the last 168 hours. BNP (last 3 results) Recent Labs    08/01/20 1122  PROBNP 1,229.0*   HbA1C: No results for input(s): HGBA1C in the last 72 hours. CBG: Recent Labs  Lab 09/08/20 2111 09/09/20 0736 09/09/20 1123 09/13/20 1847 09/14/20 0805  GLUCAP 184* 137* 140* 212* 200*   Lipid Profile: No results for input(s): CHOL, HDL, LDLCALC, TRIG, CHOLHDL, LDLDIRECT in the last 72 hours. Thyroid Function Tests: No results for input(s): TSH, T4TOTAL, FREET4, T3FREE, THYROIDAB in the last 72 hours. Anemia Panel: No results for input(s): VITAMINB12, FOLATE, FERRITIN, TIBC, IRON, RETICCTPCT in the  last 72 hours. Sepsis Labs: Recent Labs  Lab 09/13/20 1952  PROCALCITON <0.10    Recent Results (from the past 240 hour(s))  SARS Coronavirus 2 by RT PCR (hospital order, performed in [facility] hospital lab) Nasopharyngeal Nasopharyngeal Swab     Status: None   Collection Time: 09/05/20  2:50 PM   Specimen: Nasopharyngeal Swab  Result Value Ref Range Status   SARS Coronavirus 2 NEGATIVE NEGATIVE Final    Comment: (NOTE) SARS-CoV-2 target nucleic acids are NOT DETECTED.  The SARS-CoV-2 RNA is generally detectable in upper and lower respiratory specimens during the acute phase of infection. The lowest concentration of SARS-CoV-2 viral copies this assay can detect is 250 copies / mL. A negative result does not preclude SARS-CoV-2 infection and should not be used as the sole basis for treatment or other patient management decisions.  A negative result may occur with improper specimen collection / handling, submission of specimen other than nasopharyngeal swab, presence of viral mutation(s) within the areas targeted by this assay, and inadequate number of viral copies (<250 copies / mL). A negative result must be combined with  clinical observations, patient history, and epidemiological information.  Fact Sheet for Patients:   StrictlyIdeas.no  Fact Sheet for Healthcare Providers: BankingDealers.co.za  This test is not yet approved or  cleared by the [place] FDA and has been authorized for detection and/or diagnosis of SARS-CoV-2 by FDA under an Emergency Use Authorization (EUA).  This EUA will remain in effect (meaning this test can be used) for the duration of the COVID-19 declaration under Section 564(b)(1) of the Act, 21 U.S.C. section 360bbb-3(b)(1), unless the authorization is terminated or revoked sooner.  Performed at [facility], [address] [place], [place] [address]   SARS Coronavirus 2 by RT PCR (hospital order, performed in [facility] hospital lab) Nasopharyngeal Nasopharyngeal Swab     Status: None   Collection Time: 09/09/20 12:24 PM   Specimen: Nasopharyngeal Swab  Result Value Ref Range Status   SARS Coronavirus 2 NEGATIVE NEGATIVE Final    Comment: (NOTE) SARS-CoV-2 target nucleic acids are NOT DETECTED.  The SARS-CoV-2 RNA is generally detectable in upper and lower respiratory specimens during the acute phase of infection. The lowest concentration of SARS-CoV-2 viral copies this assay can detect is 250 copies / mL. A negative result does not preclude SARS-CoV-2 infection and should not be used as the sole basis for treatment or other patient management decisions.  A negative result may occur with improper specimen collection / handling, submission of specimen other than nasopharyngeal swab, presence of viral mutation(s) within the areas targeted by this assay, and inadequate number of viral copies (<250 copies / mL). A negative result must be combined with clinical observations, patient history, and epidemiological information.  Fact Sheet for Patients:   StrictlyIdeas.no  Fact Sheet for  Healthcare Providers: BankingDealers.co.za  This test is not yet approved or  cleared by the [place] FDA and has been authorized for detection and/or diagnosis of SARS-CoV-2 by FDA under an Emergency Use Authorization (EUA).  This EUA will remain in effect (meaning this test can be used) for the duration of the COVID-19 declaration under Section 564(b)(1) of the Act, 21 U.S.C. section 360bbb-3(b)(1), unless the authorization is terminated or revoked sooner.  Performed at [facility], [address] [place], [place] [address]   Respiratory Panel by RT PCR (Flu A&B, Covid) - Nasopharyngeal Swab     Status: None   Collection Time: 09/13/20  12:15 PM   Specimen: Nasopharyngeal Swab  Result Value Ref Range Status   SARS Coronavirus 2 by RT PCR NEGATIVE NEGATIVE Final    Comment: (NOTE) SARS-CoV-2 target nucleic acids are NOT DETECTED.  The SARS-CoV-2 RNA is generally detectable in upper respiratoy specimens during the acute phase of infection. The lowest concentration of SARS-CoV-2 viral copies this assay can detect is 131 copies/mL. A negative result does not preclude SARS-Cov-2 infection and should not be used as the sole basis for treatment or other patient management decisions. A negative result may occur with  improper specimen collection/handling, submission of specimen other than nasopharyngeal swab, presence of viral mutation(s) within the areas targeted by this assay, and inadequate number of viral copies (<131 copies/mL). A negative result must be combined with clinical observations, patient history, and epidemiological information. The expected result is Negative.  Fact Sheet for Patients:  PinkCheek.be  Fact Sheet for Healthcare Providers:  GravelBags.it  This test is no t yet approved or cleared by the [place] FDA and  has been authorized for detection and/or  diagnosis of SARS-CoV-2 by FDA under an Emergency Use Authorization (EUA). This EUA will remain  in effect (meaning this test can be used) for the duration of the COVID-19 declaration under Section 564(b)(1) of the Act, 21 U.S.C. section 360bbb-3(b)(1), unless the authorization is terminated or revoked sooner.     Influenza A by PCR NEGATIVE NEGATIVE Final   Influenza B by PCR NEGATIVE NEGATIVE Final    Comment: (NOTE) The Xpert Xpress SARS-CoV-2/FLU/RSV assay is intended as an aid in  the diagnosis of influenza from Nasopharyngeal swab specimens and  should not be used as a sole basis for treatment. Nasal washings and  aspirates are unacceptable for Xpert Xpress SARS-CoV-2/FLU/RSV  testing.  Fact Sheet for Patients: PinkCheek.be  Fact Sheet for Healthcare Providers: GravelBags.it  This test is not yet approved or cleared by the [place] FDA and  has been authorized for detection and/or diagnosis of SARS-CoV-2 by  FDA under an Emergency Use Authorization (EUA). This EUA will remain  in effect (meaning this test can be used) for the duration of the  Covid-19 declaration under Section 564(b)(1) of the Act, 21  U.S.C. section 360bbb-3(b)(1), unless the authorization is  terminated or revoked. Performed at [facility], [address] [place], [place] [address]   Culture, blood (routine x 2) Call MD if unable to obtain prior to antibiotics being given     Status: None (Preliminary result)   Collection Time: 09/13/20  8:16 PM   Specimen: BLOOD  Result Value Ref Range Status   Specimen Description BLOOD LEFT ANTECUBITAL  Final   Special Requests   Final    BOTTLES DRAWN AEROBIC AND ANAEROBIC Blood Culture results may not be optimal due to an inadequate volume of blood received in culture bottles   Culture   Final    NO GROWTH < 12 HOURS Performed at [facility], [address] [place], [place]  [address]    Report Status PENDING  Incomplete  Culture, blood (routine x 2) Call MD if unable to obtain prior to antibiotics being given     Status: None (Preliminary result)   Collection Time: 09/13/20  8:16 PM   Specimen: BLOOD  Result Value Ref Range Status   Specimen Description BLOOD RIGHT ANTECUBITAL  Final   Special Requests   Final    BOTTLES DRAWN AEROBIC AND ANAEROBIC Blood Culture results may not be optimal due to  an inadequate volume of blood received in culture bottles   Culture   Final    NO GROWTH < 12 HOURS Performed at [facility], [address] [place], [place] [address]    Report Status PENDING  Incomplete      Radiology Studies: CT ANGIO CHEST PE W OR WO CONTRAST  Result Date: 09/14/2020 CLINICAL DATA:  Evaluate for acute pulmonary embolus. Worsening shortness of breath and respiratory distress. EXAM: CT ANGIOGRAPHY CHEST WITH CONTRAST TECHNIQUE: Multidetector CT imaging of the chest was performed using the standard protocol during bolus administration of intravenous contrast. Multiplanar CT image reconstructions and MIPs were obtained to evaluate the vascular anatomy. CONTRAST:  58mL OMNIPAQUE IOHEXOL 350 MG/ML SOLN COMPARISON:  09/05/2020 FINDINGS: Cardiovascular: Heart size appears within normal limits. No pericardial effusion. Aortic atherosclerosis. Coronary artery calcifications. The main pulmonary artery appears patent. No central obstructing pulmonary emboli identified. No suspicious filling defects within the lobar pulmonary arteries. Respiratory motion artifact diminishes exam detail beyond the level of the lobar pulmonary arteries. Mediastinum/Nodes: Normal appearance of the thyroid gland. The trachea appears patent and is midline. Normal appearance of the esophagus. No enlarged supraclavicular, axillary, mediastinal, or hilar lymph nodes. Lungs/Pleura: Advanced changes of centrilobular and paraseptal emphysema with diffuse bronchial wall thickening.  Similar appearance of bilateral peripheral and lower lung zone predominant interstitial reticulation. Airspace consolidation, atelectasis, or pneumothorax. There is a focal subpleural nodular density within the subpleural aspect of the posteromedial left upper lobe measuring 7 mm, image 31/6. This is a new finding compared with previous exam. Upper Abdomen: No acute abnormality. Aortic atherosclerosis noted. Small cyst noted within the posteromedial dome of liver. Musculoskeletal: No chest wall abnormality. No acute or significant osseous findings. Review of the MIP images confirms the above findings. IMPRESSION: 1. Respiratory motion artifact diminishes exam detail beyond the level of the lobar pulmonary arteries. Within this limitation no suspicious pulmonary artery filling defects identified to suggest acute pulmonary embolus. 2. Advanced changes of centrilobular and paraseptal emphysema with diffuse bronchial wall thickening, as above; imaging findings suggestive of underlying COPD. Bilateral and peripheral predominant interstitial reticulation is likely postinflammatory. 3. 7 mm subpleural nodular density within the posteromedial left upper lobe is identified. New from 07/06/2020 and therefore likely postinflammatory. Non-contrast chest CT at 6-12 months is recommended. If the nodule is stable at time of repeat CT, then future CT at 18-24 months (from today's scan) is considered optional for low-risk patients, but is recommended for high-risk patients. This recommendation follows the consensus statement: Guidelines for Management of Incidental Pulmonary Nodules Detected on CT Images: From the Fleischner Society 2017; Radiology 2017; 284:228-243. 4. Aortic atherosclerosis and coronary artery calcifications. 5. Emphysema and aortic atherosclerosis. Aortic Atherosclerosis (ICD10-I70.0) and Emphysema (ICD10-J43.9). Electronically Signed   By: Kerby Moors M.D.   On: 09/14/2020 05:25   DG Chest Port 1  View  Result Date: 09/13/2020 CLINICAL DATA:  Increased shortness of breath and confusion. EXAM: PORTABLE CHEST 1 VIEW COMPARISON:  09/13/2020 FINDINGS: The cardiac silhouette remains borderline enlarged. Stable prominence of the interstitial markings. No Kerley lines or pleural fluid. Stable mild scoliosis. IMPRESSION: No acute abnormality. Stable borderline cardiomegaly and chronic interstitial lung disease. Electronically Signed   By: Claudie Revering M.D.   On: 09/13/2020 12:59   DG Chest Portable 1 View  Result Date: 09/13/2020 CLINICAL DATA:  Cough EXAM: PORTABLE CHEST 1 VIEW COMPARISON:  09/03/2020 FINDINGS: The heart is normal size. Improved bibasilar opacities. Bibasilar scarring/fibrosis. No effusions or acute bony abnormality. IMPRESSION:  Chronic changes in the lung bases compatible with scarring/fibrosis. Improved opacity at the right lung base, likely resolved superimposed infection. Electronically Signed   By: Rolm Baptise M.D.   On: 09/13/2020 01:24      Scheduled Meds: . atenolol  50 mg Oral Daily  . azithromycin  500 mg Oral Daily   Followed by  . [START ON 09/15/2020] azithromycin  250 mg Oral Daily  . baclofen  10 mg Oral QID  . clopidogrel  75 mg Oral Daily  . clotrimazole   Topical Daily  . enoxaparin (LOVENOX) injection  40 mg Subcutaneous Q24H  . ezetimibe  10 mg Oral q1800   And  . simvastatin  40 mg Oral q1800  . gabapentin  200 mg Oral TID  . insulin aspart  0-5 Units Subcutaneous QHS  . insulin aspart  0-9 Units Subcutaneous TID WC  . ipratropium-albuterol  3 mL Nebulization Q4H  . levothyroxine  50 mcg Oral QAC breakfast  . methylPREDNISolone (SOLU-MEDROL) injection  40 mg Intravenous Q12H  . multivitamin with minerals  1 tablet Oral Daily  . pramipexole  0.125 mg Oral TID  . torsemide  20 mg Oral Daily  . zinc sulfate  220 mg Oral Daily   Continuous Infusions:   LOS: 1 day      Time spent: 40 minutes   Dessa Phi, DO [facility] 09/14/2020, 10:32 AM   Available via Epic secure chat 7am-7pm After these hours, please refer to coverage provider listed on [email or web address]

## 2020-09-14 NOTE — ED Notes (Signed)
Pt cleaned up and clean linens and gown placed. Pt given warm blankets. Pt pulled up and repositioned at this time

## 2020-09-14 NOTE — Progress Notes (Signed)
Pt sleeping soundly, upon arousal, pt becomes very agitated, fighting, swatting at [employer]. Refusing to take meds/MD aware, ok to hold meds for now. Will continue to monitor.

## 2020-09-14 NOTE — ED Notes (Signed)
Family at bedside. 

## 2020-09-14 NOTE — ED Notes (Signed)
Wife went home. Stated to call house phone first and cell phone last if need to get in touch with her. Pt asleep at this time. Bed positioned so pt can't get out of it. Will hold AM meds. This RN did place moisturizer on pt lips d/t being dry. Pt would not drink or eat applesauce for this RN prior to medication administration.

## 2020-09-14 NOTE — ED Notes (Signed)
Patient was repositioned on stretcher with another RN assist. Patient tolerated it well. Male external catheter draining well. High flow O2 in place.

## 2020-09-14 NOTE — ED Notes (Signed)
Hospitalist at bedside 

## 2020-09-14 NOTE — ED Notes (Signed)
Patient remains on high flow O2. Patient had dislodged the prongs of the cannula and pulse ox was 87% with a good wave form. Nasal Cannula was repositioned and pulse ox is now 97%. Patient opened eyes briefly, but did not full wake up. Breakfast has not been touched.

## 2020-09-14 NOTE — ED Notes (Signed)
Pt is asleep. Wife at bedside. Will attempt to give oral medications when pt wakes up more and is able to stay awake.

## 2020-09-15 LAB — BASIC METABOLIC PANEL
Anion gap: 10 (ref 5–15)
BUN: 35 mg/dL — ABNORMAL HIGH (ref 8–23)
CO2: 26 mmol/L (ref 22–32)
Calcium: 9.6 mg/dL (ref 8.9–10.3)
Chloride: 102 mmol/L (ref 98–111)
Creatinine, Ser: 1.28 mg/dL — ABNORMAL HIGH (ref 0.61–1.24)
GFR, Estimated: 52 mL/min — ABNORMAL LOW (ref >60)
Glucose, Bld: 198 mg/dL — ABNORMAL HIGH (ref 70–99)
Potassium: 4.1 mmol/L (ref 3.5–5.1)
Sodium: 138 mmol/L (ref 135–145)

## 2020-09-15 LAB — CBC
HCT: 45.8 % (ref 39.0–52.0)
Hemoglobin: 15.5 g/dL (ref 13.0–17.0)
MCH: 35.1 pg — ABNORMAL HIGH (ref 26.0–34.0)
MCHC: 33.8 g/dL (ref 30.0–36.0)
MCV: 103.9 fL — ABNORMAL HIGH (ref 80.0–100.0)
Platelets: 244 10*3/uL (ref 150–400)
RBC: 4.41 MIL/uL (ref 4.22–5.81)
RDW: 14 % (ref 11.5–15.5)
WBC: 19.4 10*3/uL — ABNORMAL HIGH (ref 4.0–10.5)
nRBC: 0 % (ref 0.0–0.2)

## 2020-09-15 LAB — GLUCOSE, CAPILLARY
Glucose-Capillary: 196 mg/dL — ABNORMAL HIGH (ref 70–99)
Glucose-Capillary: 189 mg/dL — ABNORMAL HIGH (ref 70–99)
Glucose-Capillary: 171 mg/dL — ABNORMAL HIGH (ref 70–99)
Glucose-Capillary: 219 mg/dL — ABNORMAL HIGH (ref 70–99)
Glucose-Capillary: 226 mg/dL — ABNORMAL HIGH (ref 70–99)

## 2020-09-15 MED ORDER — ENOXAPARIN SODIUM 30 MG/0.3ML ~~LOC~~ SOLN
30.0000 mg | SUBCUTANEOUS | Status: DC
  Administered 2020-09-15: 30 mg via SUBCUTANEOUS
  Filled 2020-09-15: qty 0.3

## 2020-09-15 MED ORDER — SODIUM CHLORIDE 0.9% FLUSH
10.0000 mL | Freq: Two times a day (BID) | INTRAVENOUS | Status: DC
  Administered 2020-09-15 – 2020-09-17 (×6): 10 mL via INTRAVENOUS

## 2020-09-15 MED ORDER — ENSURE ENLIVE PO LIQD: 237.0000 mL | Freq: Two times a day (BID) | ORAL | Status: DC

## 2020-09-15 MED ORDER — IPRATROPIUM-ALBUTEROL 0.5-2.5 (3) MG/3ML IN SOLN
3.0000 mL | Freq: Two times a day (BID) | RESPIRATORY_TRACT | Status: DC
  Administered 2020-09-15 – 2020-09-16 (×2): 3 mL via RESPIRATORY_TRACT
  Filled 2020-09-15 (×2): qty 3

## 2020-09-15 MED ORDER — LORAZEPAM 2 MG/ML IJ SOLN
1.0000 mg | Freq: Four times a day (QID) | INTRAMUSCULAR | Status: DC | PRN
  Administered 2020-09-15 – 2020-09-16 (×3): 1 mg via INTRAVENOUS
  Filled 2020-09-15 (×3): qty 1

## 2020-09-15 NOTE — Plan of Care (Signed)
Pt very agitated this afternoon after daughter left.  Swinging at staff. Removed HFNC and telebox.  Fighting staff when trying to replace.  Provider notified and new orders received.  This RN sat with patient until able to calm Resting comfortably in bed now Call bell within reach.  Will continue to closely monitor   Problem: Education: Goal: Knowledge of General Education information will improve Description: Including pain rating scale, medication(s)/side effects and non-pharmacologic comfort measures Outcome: Progressing   Problem: Health Behavior/Discharge Planning: Goal: Ability to manage health-related needs will improve Outcome: Progressing   Problem: Clinical Measurements: Goal: Ability to maintain clinical measurements within normal limits will improve Outcome: Progressing Goal: Will remain free from infection Outcome: Progressing Goal: Diagnostic test results will improve Outcome: Progressing Goal: Respiratory complications will improve Outcome: Progressing Goal: Cardiovascular complication will be avoided Outcome: Progressing   Problem: Activity: Goal: Risk for activity intolerance will decrease Outcome: Progressing   Problem: Nutrition: Goal: Adequate nutrition will be maintained Outcome: Progressing   Problem: Coping: Goal: Level of anxiety will decrease Outcome: Progressing   Problem: Elimination: Goal: Will not experience complications related to bowel motility Outcome: Progressing Goal: Will not experience complications related to urinary retention Outcome: Progressing   Problem: Pain Managment: Goal: General experience of comfort will improve Outcome: Progressing   Problem: Safety: Goal: Ability to remain free from injury will improve Outcome: Progressing   Problem: Skin Integrity: Goal: Risk for impaired skin integrity will decrease Outcome: Progressing   Problem: Education: Goal: Knowledge of disease or condition will improve Outcome:  Progressing Goal: Knowledge of the prescribed therapeutic regimen will improve Outcome: Progressing Goal: Individualized Educational Video(s) Outcome: Progressing   Problem: Activity: Goal: Ability to tolerate increased activity will improve Outcome: Progressing Goal: Will verbalize the importance of balancing activity with adequate rest periods Outcome: Progressing   Problem: Respiratory: Goal: Ability to maintain a clear airway will improve Outcome: Progressing Goal: Levels of oxygenation will improve Outcome: Progressing Goal: Ability to maintain adequate ventilation will improve Outcome: Progressing

## 2020-09-15 NOTE — Progress Notes (Addendum)
Patient is fighting while he is asleep. He open is eyes and tries to elope from bed when touched by staff. Patient is not coherent and does not understand that he needs his medications. Unable to  Administer medications at this time.

## 2020-09-15 NOTE — Progress Notes (Signed)
Initial Nutrition Assessment  DOCUMENTATION CODES:   Not applicable  INTERVENTION:   Ensure Enlive po BID, each supplement provides 350 kcal and 20 grams of protein  MVI daily   Liberalize diet   NUTRITION DIAGNOSIS:   Increased nutrient needs related to chronic illness (COPD, CHF) as evidenced by increased estimated needs.  GOAL:   Patient will meet greater than or equal to 90% of their needs  MONITOR:   PO intake, Supplement acceptance, Labs, Weight trends, Skin, I & O's  REASON FOR ASSESSMENT:   Malnutrition Screening Tool    ASSESSMENT:   86 y.o. male with medical history significant of hypertension, hyperlipidemia, diabetes mellitus, COPD, stroke, hypothyroidism, pulmonary fibrosis, PVD, dCHF, CKD-III, BPH, skin cancer, CAD, who presents with shortness breath.   RD working remotely.  Unable to reach pt by phone. Pt documented to be eating only sips and bites of meals. RD will add supplements to help pt meet his estimated needs. RD will also liberalize the heart healthy portion of pt's diet as this is restrictive of protein and pt is not eating enough to exceed any nutrient limits. Per chart, pt is down 35lbs(24%) from his UBW. RD unsure if admit weight is correct. Pt's UBW appears to be ~150-155lbs. RD will obtain nutrition related history and exam at follow-up. Pt is at high risk for malnutrition.   Medications reviewed and include: azithromycin, plavix, lovenox, insulin, synthroid, solu-medrol, MVI, torsemide, zinc   Labs reviewed: BUN 35(H), creat 1.28(H) Wbc- 19.4(H) cbgs- 196, 189 x 24 hrs AIC 6.2- 6/15  NUTRITION - FOCUSED PHYSICAL EXAM: Unable to perform at this time   Diet Order:   Diet Order            Diet Carb Modified Fluid consistency: Thin; Room service appropriate? Yes; Fluid restriction: 1500 mL Fluid  Diet effective now                EDUCATION NEEDS:   No education needs have been identified at this time  Skin:  Skin Assessment:  Reviewed RN Assessment (ecchymosis, VSU BLU, diabetic foot wound heel, wound buttocks)  Last BM:  pta  Height:   Ht Readings from Last 1 Encounters:  09/14/20 5\' 2"  (1.575 m)    Weight:   Wt Readings from Last 1 Encounters:  09/14/20 51.6 kg    Ideal Body Weight:  53.6 kg  BMI:  Body mass index is 20.81 kg/m.  Estimated Nutritional Needs:   Kcal:  1400-1600kcal/day  Protein:  70-80g/day  Fluid:  1.4-1.6L/day  Koleen Distance MS, RD, LDN Please refer to [facility] for RD and/or RD on-call/weekend/after hours pager

## 2020-09-15 NOTE — Progress Notes (Addendum)
PROGRESS NOTE    Dylan Knight.  FKC:[id number] DOB: [birth year] DOA: 09/13/2020 PCP: Tonia Ghent, MD     Brief Narrative:  Dylan Knight. is a 86 y.o. male with medical history significant of hypertension, hyperlipidemia, diabetes mellitus, COPD, stroke, hypothyroidism, pulmonary fibrosis, PVD, dCHF, CKD-III, BPH, skin cancer, CAD, who presents with shortness breath.  Pt was found to have worsening shortness of breath and respiratory distress. Patient was found to have oxygen desaturation to 83% on his home level 4L of oxygen initally. Patient was given DuoNeb in route with improvement in his breathing however he is still satting in the low 90s. Patient was recently hospitalized from 10/26-11/11/2019 due to CHF exacerbation.  Now admitted secondary to COPD exacerbation with respiratory failure.  New events last 24 hours / Subjective: Patient is easily arousable.  States that he continues to have some shortness of breath.  He is oriented to self and place and situation.  He remains on humidified high flow nasal cannula O2  Assessment & Plan:   Principal Problem:   COPD exacerbation (HCC) Active Problems:   Diabetes mellitus type 2, controlled ([place])   Hypothyroidism   History of stroke   Hyperlipemia   HTN (hypertension)   ILD (interstitial lung disease) (HCC)   Chronic diastolic CHF (congestive heart failure) (HCC)   Acute metabolic encephalopathy   Acute on chronic respiratory failure with hypoxia (HCC)   CKD (chronic kidney disease), stage IIIa    Acute on chronic respiratory failure with hypoxia due to COPD exacerbation in the setting of ILD (interstitial lung disease) -CTA with motion artifact, however no suspicious pulmonary artery filling defects to suggest acute PE.  Left upper lobe nodular density, follow-up with noncontrast CT chest in 6 to 12 months -Continue supplemental oxygen to maintain sat 88 to 92% -Continue Solu-Medrol, azithromycin, breathing  treatment  Diabetes mellitus type 2, controlled with complication for PVD -Recent A1c 6.2, well controlled.  Patient is taking Janumet at home -Sliding scale insulin  Hypothyroidism -Synthroid  History of stroke -Plavix, Vytorin  Hyperlipemia -Vytorin  HTN  -IV hydralazine as needed -Atenolol  Chronic diastolic CHF -2D echo on 74/94/4967 showed EF of 60 to 65%.  His BNP is 810, no pulmonary edema on chest x-ray.  No leg edema or JVD.  Does not seem to have CHF exacerbation. -Continue home torsemide  CKD stage IIIa -Baseline creatinine 1.2 -Stable  Acute metabolic encephalopathy -Patient oriented to person and place at the time of admission  Leg cramps and discomfort in both legs -Try pramipexole for possible restless leg syndrome  Leukocytosis -In setting of Solu-Medrol.  Continue to monitor.  DVT prophylaxis:  enoxaparin (LOVENOX) injection 30 mg Start: 09/15/20 2200  Code Status: DNR Family Communication: No family at bedside this morning; discussed with daughter and wife  Disposition Plan:  Status is: Inpatient  Remains inpatient appropriate because:Hemodynamically unstable, Altered mental status, IV treatments appropriate due to intensity of illness or inability to take PO and Inpatient level of care appropriate due to severity of illness   Dispo: The patient is from: SNF              Anticipated d/c is to: SNF              Anticipated d/c date is: 3 days              Patient currently is not medically stable to d/c.  Remains on high flow nasal cannula O2 today.  Consultants:   None  Procedures:   None  Antimicrobials:  Anti-infectives (From admission, onward)   Start     Dose/Rate Route Frequency Ordered Stop   09/15/20 1000  azithromycin (ZITHROMAX) tablet 250 mg       "Followed by" Linked Group Details   250 mg Oral Daily 09/13/20 1321 Oct 14, 2020 0959   09/14/20 1000  azithromycin (ZITHROMAX) tablet 500 mg       "Followed by"  Linked Group Details   500 mg Oral Daily 09/13/20 1321 09/15/20 0959   09/13/20 1300  azithromycin (ZITHROMAX) 500 mg in sodium chloride 0.9 % 250 mL IVPB  Status:  Discontinued        500 mg 250 mL/hr over 60 Minutes Intravenous  Once 09/13/20 1255 09/13/20 1444       Objective: Vitals:   09/15/20 0424 09/15/20 0453 09/15/20 0734 09/15/20 0747  BP:  (!) 121/53  133/77  Pulse:  76  79  Resp:  19  16  Temp:  97.6 F (36.4 C)  97.6 F (36.4 C)  TempSrc:  Oral  Oral  SpO2: 99% 97% 96% 100%  Weight:      Height:        Intake/Output Summary (Last 24 hours) at 09/15/2020 1024 Last data filed at 09/14/2020 1830 Gross per 24 hour  Intake 0 ml  Output 100 ml  Net -100 ml   Filed Weights   09/14/20 1535  Weight: 51.6 kg    Examination: General exam: Appears calm and comfortable  Respiratory system: Clear to auscultation anteriorly. Respiratory effort normal.  Without acute respiratory distress Cardiovascular system: S1 & S2 heard, RRR. No pedal edema. Gastrointestinal system: Abdomen is nondistended, soft and nontender. Normal bowel sounds heard. Central nervous system: Alert and oriented to person and place and situation.  Extremities: Symmetric in appearance bilaterally  Skin: No rashes, lesions or ulcers on exposed skin    Data Reviewed: I have personally reviewed following labs and imaging studies  CBC: Recent Labs  Lab 09/13/20 0109 09/13/20 1214 09/14/20 0653 09/15/20 0354  WBC 12.5* 10.9* 11.4* 19.4*  NEUTROABS  --  8.8*  --   --   HGB 15.9 16.1 14.3 15.5  HCT 46.2 46.4 40.6 45.8  MCV 102.2* 102.2* 100.0 103.9*  PLT 234 216 222 440   Basic Metabolic Panel: Recent Labs  Lab 09/09/20 0453 09/13/20 0109 09/13/20 1214 09/14/20 0653 09/15/20 0354  NA 134* 131* 134* 132* 138  K 4.7 4.2 4.1 4.0 4.1  CL 94* 91* 96* 95* 102  CO2 32 27 27 24 26   GLUCOSE 157* 180* 158* 205* 198*  BUN 30* 32* 26* 33* 35*  CREATININE 1.26* 1.43* 1.31* 1.41* 1.28*  CALCIUM  9.1 10.0 9.5 9.3 9.6  MG  --  2.3  --   --   --    GFR: Estimated Creatinine Clearance: 25.8 mL/min (A) (by C-G formula based on SCr of 1.28 mg/dL (H)). Liver Function Tests: Recent Labs  Lab 09/13/20 0109  AST 37  ALT 31  ALKPHOS 91  BILITOT 1.7*  PROT 7.0  ALBUMIN 3.9   No results for input(s): LIPASE, AMYLASE in the last 168 hours. No results for input(s): AMMONIA in the last 168 hours. Coagulation Profile: No results for input(s): INR, PROTIME in the last 168 hours. Cardiac Enzymes: No results for input(s): CKTOTAL, CKMB, CKMBINDEX, TROPONINI in the last 168 hours. BNP (last 3 results) Recent Labs    08/01/20 1122  PROBNP 1,229.0*  HbA1C: No results for input(s): HGBA1C in the last 72 hours. CBG: Recent Labs  Lab 09/14/20 0805 09/14/20 1315 09/14/20 1653 09/14/20 2006 09/15/20 0750  GLUCAP 200* 155* 167* 143* 196*   Lipid Profile: No results for input(s): CHOL, HDL, LDLCALC, TRIG, CHOLHDL, LDLDIRECT in the last 72 hours. Thyroid Function Tests: No results for input(s): TSH, T4TOTAL, FREET4, T3FREE, THYROIDAB in the last 72 hours. Anemia Panel: No results for input(s): VITAMINB12, FOLATE, FERRITIN, TIBC, IRON, RETICCTPCT in the last 72 hours. Sepsis Labs: Recent Labs  Lab 09/13/20 1952  PROCALCITON <0.10    Recent Results (from the past 240 hour(s))  SARS Coronavirus 2 by RT PCR (hospital order, performed in [facility] hospital lab) Nasopharyngeal Nasopharyngeal Swab     Status: None   Collection Time: 09/05/20  2:50 PM   Specimen: Nasopharyngeal Swab  Result Value Ref Range Status   SARS Coronavirus 2 NEGATIVE NEGATIVE Final    Comment: (NOTE) SARS-CoV-2 target nucleic acids are NOT DETECTED.  The SARS-CoV-2 RNA is generally detectable in upper and lower respiratory specimens during the acute phase of infection. The lowest concentration of SARS-CoV-2 viral copies this assay can detect is 250 copies / mL. A negative result does not preclude  SARS-CoV-2 infection and should not be used as the sole basis for treatment or other patient management decisions.  A negative result may occur with improper specimen collection / handling, submission of specimen other than nasopharyngeal swab, presence of viral mutation(s) within the areas targeted by this assay, and inadequate number of viral copies (<250 copies / mL). A negative result must be combined with clinical observations, patient history, and epidemiological information.  Fact Sheet for Patients:   StrictlyIdeas.no  Fact Sheet for Healthcare Providers: BankingDealers.co.za  This test is not yet approved or  cleared by the [place] FDA and has been authorized for detection and/or diagnosis of SARS-CoV-2 by FDA under an Emergency Use Authorization (EUA).  This EUA will remain in effect (meaning this test can be used) for the duration of the COVID-19 declaration under Section 564(b)(1) of the Act, 21 U.S.C. section 360bbb-3(b)(1), unless the authorization is terminated or revoked sooner.  Performed at [facility], [address] [place],  [address]   SARS Coronavirus 2 by RT PCR (hospital order, performed in [facility] hospital lab) Nasopharyngeal Nasopharyngeal Swab     Status: None   Collection Time: 09/09/20 12:24 PM   Specimen: Nasopharyngeal Swab  Result Value Ref Range Status   SARS Coronavirus 2 NEGATIVE NEGATIVE Final    Comment: (NOTE) SARS-CoV-2 target nucleic acids are NOT DETECTED.  The SARS-CoV-2 RNA is generally detectable in upper and lower respiratory specimens during the acute phase of infection. The lowest concentration of SARS-CoV-2 viral copies this assay can detect is 250 copies / mL. A negative result does not preclude SARS-CoV-2 infection and should not be used as the sole basis for treatment or other patient management decisions.  A negative result may occur with improper  specimen collection / handling, submission of specimen other than nasopharyngeal swab, presence of viral mutation(s) within the areas targeted by this assay, and inadequate number of viral copies (<250 copies / mL). A negative result must be combined with clinical observations, patient history, and epidemiological information.  Fact Sheet for Patients:   StrictlyIdeas.no  Fact Sheet for Healthcare Providers: BankingDealers.co.za  This test is not yet approved or  cleared by the [place] FDA and has been authorized for detection and/or diagnosis of SARS-CoV-2  by FDA under an Emergency Use Authorization (EUA).  This EUA will remain in effect (meaning this test can be used) for the duration of the COVID-19 declaration under Section 564(b)(1) of the Act, 21 U.S.C. section 360bbb-3(b)(1), unless the authorization is terminated or revoked sooner.  Performed at [facility], [address] [place], [place] [address]   Respiratory Panel by RT PCR (Flu A&B, Covid) - Nasopharyngeal Swab     Status: None   Collection Time: 09/13/20 12:15 PM   Specimen: Nasopharyngeal Swab  Result Value Ref Range Status   SARS Coronavirus 2 by RT PCR NEGATIVE NEGATIVE Final    Comment: (NOTE) SARS-CoV-2 target nucleic acids are NOT DETECTED.  The SARS-CoV-2 RNA is generally detectable in upper respiratoy specimens during the acute phase of infection. The lowest concentration of SARS-CoV-2 viral copies this assay can detect is 131 copies/mL. A negative result does not preclude SARS-Cov-2 infection and should not be used as the sole basis for treatment or other patient management decisions. A negative result may occur with  improper specimen collection/handling, submission of specimen other than nasopharyngeal swab, presence of viral mutation(s) within the areas targeted by this assay, and inadequate number of viral copies (<131 copies/mL). A  negative result must be combined with clinical observations, patient history, and epidemiological information. The expected result is Negative.  Fact Sheet for Patients:  PinkCheek.be  Fact Sheet for Healthcare Providers:  GravelBags.it  This test is no t yet approved or cleared by the [place] FDA and  has been authorized for detection and/or diagnosis of SARS-CoV-2 by FDA under an Emergency Use Authorization (EUA). This EUA will remain  in effect (meaning this test can be used) for the duration of the COVID-19 declaration under Section 564(b)(1) of the Act, 21 U.S.C. section 360bbb-3(b)(1), unless the authorization is terminated or revoked sooner.     Influenza A by PCR NEGATIVE NEGATIVE Final   Influenza B by PCR NEGATIVE NEGATIVE Final    Comment: (NOTE) The Xpert Xpress SARS-CoV-2/FLU/RSV assay is intended as an aid in  the diagnosis of influenza from Nasopharyngeal swab specimens and  should not be used as a sole basis for treatment. Nasal washings and  aspirates are unacceptable for Xpert Xpress SARS-CoV-2/FLU/RSV  testing.  Fact Sheet for Patients: PinkCheek.be  Fact Sheet for Healthcare Providers: GravelBags.it  This test is not yet approved or cleared by the [place] FDA and  has been authorized for detection and/or diagnosis of SARS-CoV-2 by  FDA under an Emergency Use Authorization (EUA). This EUA will remain  in effect (meaning this test can be used) for the duration of the  Covid-19 declaration under Section 564(b)(1) of the Act, 21  U.S.C. section 360bbb-3(b)(1), unless the authorization is  terminated or revoked. Performed at [facility], [address] [place], [place] [address]   Culture, blood (routine x 2) Call MD if unable to obtain prior to antibiotics being given     Status: None (Preliminary result)   Collection  Time: 09/13/20  8:16 PM   Specimen: BLOOD  Result Value Ref Range Status   Specimen Description BLOOD LEFT ANTECUBITAL  Final   Special Requests   Final    BOTTLES DRAWN AEROBIC AND ANAEROBIC Blood Culture results may not be optimal due to an inadequate volume of blood received in culture bottles   Culture   Final    NO GROWTH < 12 HOURS Performed at [facility], [address], [place], [place] [address]  Report Status PENDING  Incomplete  Culture, blood (routine x 2) Call MD if unable to obtain prior to antibiotics being given     Status: None (Preliminary result)   Collection Time: 09/13/20  8:16 PM   Specimen: BLOOD  Result Value Ref Range Status   Specimen Description BLOOD RIGHT ANTECUBITAL  Final   Special Requests   Final    BOTTLES DRAWN AEROBIC AND ANAEROBIC Blood Culture results may not be optimal due to an inadequate volume of blood received in culture bottles   Culture   Final    NO GROWTH < 12 HOURS Performed at [facility], [address] [place], [place] [address]    Report Status PENDING  Incomplete      Radiology Studies: CT ANGIO CHEST PE W OR WO CONTRAST  Result Date: 09/14/2020 CLINICAL DATA:  Evaluate for acute pulmonary embolus. Worsening shortness of breath and respiratory distress. EXAM: CT ANGIOGRAPHY CHEST WITH CONTRAST TECHNIQUE: Multidetector CT imaging of the chest was performed using the standard protocol during bolus administration of intravenous contrast. Multiplanar CT image reconstructions and MIPs were obtained to evaluate the vascular anatomy. CONTRAST:  14mL OMNIPAQUE IOHEXOL 350 MG/ML SOLN COMPARISON:  09/05/2020 FINDINGS: Cardiovascular: Heart size appears within normal limits. No pericardial effusion. Aortic atherosclerosis. Coronary artery calcifications. The main pulmonary artery appears patent. No central obstructing pulmonary emboli identified. No suspicious filling defects within the lobar pulmonary arteries.  Respiratory motion artifact diminishes exam detail beyond the level of the lobar pulmonary arteries. Mediastinum/Nodes: Normal appearance of the thyroid gland. The trachea appears patent and is midline. Normal appearance of the esophagus. No enlarged supraclavicular, axillary, mediastinal, or hilar lymph nodes. Lungs/Pleura: Advanced changes of centrilobular and paraseptal emphysema with diffuse bronchial wall thickening. Similar appearance of bilateral peripheral and lower lung zone predominant interstitial reticulation. Airspace consolidation, atelectasis, or pneumothorax. There is a focal subpleural nodular density within the subpleural aspect of the posteromedial left upper lobe measuring 7 mm, image 31/6. This is a new finding compared with previous exam. Upper Abdomen: No acute abnormality. Aortic atherosclerosis noted. Small cyst noted within the posteromedial dome of liver. Musculoskeletal: No chest wall abnormality. No acute or significant osseous findings. Review of the MIP images confirms the above findings. IMPRESSION: 1. Respiratory motion artifact diminishes exam detail beyond the level of the lobar pulmonary arteries. Within this limitation no suspicious pulmonary artery filling defects identified to suggest acute pulmonary embolus. 2. Advanced changes of centrilobular and paraseptal emphysema with diffuse bronchial wall thickening, as above; imaging findings suggestive of underlying COPD. Bilateral and peripheral predominant interstitial reticulation is likely postinflammatory. 3. 7 mm subpleural nodular density within the posteromedial left upper lobe is identified. New from 07/06/2020 and therefore likely postinflammatory. Non-contrast chest CT at 6-12 months is recommended. If the nodule is stable at time of repeat CT, then future CT at 18-24 months (from today's scan) is considered optional for low-risk patients, but is recommended for high-risk patients. This recommendation follows the consensus  statement: Guidelines for Management of Incidental Pulmonary Nodules Detected on CT Images: From the Fleischner Society 2017; Radiology 2017; 284:228-243. 4. Aortic atherosclerosis and coronary artery calcifications. 5. Emphysema and aortic atherosclerosis. Aortic Atherosclerosis (ICD10-I70.0) and Emphysema (ICD10-J43.9). Electronically Signed   By: Kerby Moors M.D.   On: 09/14/2020 05:25   DG Chest Port 1 View  Result Date: 09/13/2020 CLINICAL DATA:  Increased shortness of breath and confusion. EXAM: PORTABLE CHEST 1 VIEW COMPARISON:  09/13/2020 FINDINGS: The cardiac silhouette remains borderline enlarged. Stable  prominence of the interstitial markings. No Kerley lines or pleural fluid. Stable mild scoliosis. IMPRESSION: No acute abnormality. Stable borderline cardiomegaly and chronic interstitial lung disease. Electronically Signed   By: Claudie Revering M.D.   On: 09/13/2020 12:59      Scheduled Meds: . atenolol  25 mg Oral Daily  . azithromycin  250 mg Oral Daily  . baclofen  10 mg Oral QID  . clopidogrel  75 mg Oral Daily  . clotrimazole   Topical Daily  . enoxaparin (LOVENOX) injection  30 mg Subcutaneous Q24H  . ezetimibe  10 mg Oral q1800   And  . simvastatin  40 mg Oral q1800  . gabapentin  200 mg Oral TID  . insulin aspart  0-5 Units Subcutaneous QHS  . insulin aspart  0-9 Units Subcutaneous TID WC  . ipratropium-albuterol  3 mL Nebulization Q4H  . levothyroxine  50 mcg Oral QAC breakfast  . methylPREDNISolone (SOLU-MEDROL) injection  40 mg Intravenous Q12H  . multivitamin with minerals  1 tablet Oral Daily  . pramipexole  0.125 mg Oral TID  . torsemide  20 mg Oral Daily  . zinc sulfate  220 mg Oral Daily   Continuous Infusions:   LOS: 2 days      Time spent: 40 minutes   Dessa Phi, DO [facility] 09/15/2020, 10:24 AM   Available via Epic secure chat 7am-7pm After these hours, please refer to coverage provider listed on [email or web address]

## 2020-09-15 NOTE — Progress Notes (Signed)
PHARMACIST - PHYSICIAN COMMUNICATION  CONCERNING:  Enoxaparin (Lovenox) for DVT Prophylaxis    RECOMMENDATION: Patient was prescribed enoxaprin 40mg  q24 hours for VTE prophylaxis.   Filed Weights   09/14/20 1535  Weight: 51.6 kg (113 lb 12.8 oz)    Body mass index is 20.81 kg/m.  Estimated Creatinine Clearance: 25.8 mL/min (A) (by C-G formula based on SCr of 1.28 mg/dL (H)).   Patient is candidate for enoxaparin 30mg  every 24 hours based on CrCl <69ml/min  DESCRIPTION: Pharmacy has adjusted enoxaparin dose per [facility] policy.  Patient is now receiving enoxaparin 30mg  every [place], PharmD Pharmacy Resident  09/15/2020 10:06 AM

## 2020-09-15 NOTE — Progress Notes (Signed)
PT Cancellation Note  Patient Details Name: Dylan WALLS Sr. MRN: [id number] DOB: [birth year]   Cancelled Treatment:    Reason Eval/Treat Not Completed: Patient declined, no reason specified   Alanson Puls, PT DPT 09/15/2020, 12:19 PM

## 2020-09-15 NOTE — Plan of Care (Signed)
  Problem: Education: Goal: Knowledge of General Education information will improve Description: Including pain rating scale, medication(s)/side effects and non-pharmacologic comfort measures Outcome: Not Progressing   Problem: Health Behavior/Discharge Planning: Goal: Ability to manage health-related needs will improve Outcome: Not Progressing   Patient is confused and uncooperative.

## 2020-09-16 DIAGNOSIS — R627 Adult failure to thrive: Secondary | ICD-10-CM

## 2020-09-16 DIAGNOSIS — G9341 Metabolic encephalopathy: Secondary | ICD-10-CM

## 2020-09-16 LAB — MRSA PCR SCREENING: MRSA by PCR: NEGATIVE

## 2020-09-16 LAB — GLUCOSE, CAPILLARY
Glucose-Capillary: 227 mg/dL — ABNORMAL HIGH (ref 70–99)
Glucose-Capillary: 209 mg/dL — ABNORMAL HIGH (ref 70–99)

## 2020-09-16 LAB — CBC
HCT: 48.4 % (ref 39.0–52.0)
Hemoglobin: 16.8 g/dL (ref 13.0–17.0)
MCH: 35.2 pg — ABNORMAL HIGH (ref 26.0–34.0)
MCHC: 34.7 g/dL (ref 30.0–36.0)
MCV: 101.5 fL — ABNORMAL HIGH (ref 80.0–100.0)
Platelets: 311 10*3/uL (ref 150–400)
RBC: 4.77 MIL/uL (ref 4.22–5.81)
RDW: 13.9 % (ref 11.5–15.5)
WBC: 13.6 10*3/uL — ABNORMAL HIGH (ref 4.0–10.5)
nRBC: 0 % (ref 0.0–0.2)

## 2020-09-16 LAB — BASIC METABOLIC PANEL
Anion gap: 14 (ref 5–15)
BUN: 44 mg/dL — ABNORMAL HIGH (ref 8–23)
CO2: 25 mmol/L (ref 22–32)
Calcium: 10.2 mg/dL (ref 8.9–10.3)
Chloride: 102 mmol/L (ref 98–111)
Creatinine, Ser: 1.46 mg/dL — ABNORMAL HIGH (ref 0.61–1.24)
GFR, Estimated: 44 mL/min — ABNORMAL LOW (ref >60)
Glucose, Bld: 201 mg/dL — ABNORMAL HIGH (ref 70–99)
Potassium: 4.4 mmol/L (ref 3.5–5.1)
Sodium: 141 mmol/L (ref 135–145)

## 2020-09-16 MED ORDER — IPRATROPIUM-ALBUTEROL 0.5-2.5 (3) MG/3ML IN SOLN: 3.0000 mL | Freq: Four times a day (QID) | RESPIRATORY_TRACT | Status: DC | PRN

## 2020-09-16 MED ORDER — HALOPERIDOL LACTATE 5 MG/ML IJ SOLN
5.0000 mg | INTRAMUSCULAR | Status: DC | PRN
  Administered 2020-09-16 – 2020-09-17 (×4): 5 mg via INTRAVENOUS
  Filled 2020-09-16 (×5): qty 1

## 2020-09-16 MED ORDER — HALOPERIDOL LACTATE 5 MG/ML IJ SOLN: 5.0000 mg | Freq: Four times a day (QID) | INTRAMUSCULAR | Status: DC | PRN

## 2020-09-16 MED ORDER — MORPHINE SULFATE (CONCENTRATE) 10 MG/0.5ML PO SOLN
5.0000 mg | ORAL | Status: DC | PRN
  Administered 2020-09-16: 5 mg via ORAL
  Filled 2020-09-16: qty 0.5

## 2020-09-16 MED ORDER — MORPHINE SULFATE (PF) 2 MG/ML IV SOLN
1.0000 mg | INTRAVENOUS | Status: DC | PRN
  Administered 2020-09-16 – 2020-09-17 (×6): 1 mg via INTRAVENOUS
  Filled 2020-09-16 (×6): qty 1

## 2020-09-16 MED ORDER — HALOPERIDOL LACTATE 5 MG/ML IJ SOLN
5.0000 mg | Freq: Once | INTRAMUSCULAR | Status: AC
  Administered 2020-09-16: 5 mg via INTRAVENOUS
  Filled 2020-09-16: qty 1

## 2020-09-16 MED ORDER — LORAZEPAM 2 MG/ML IJ SOLN
1.0000 mg | INTRAMUSCULAR | Status: DC | PRN
  Administered 2020-09-16 – 2020-09-17 (×4): 1 mg via INTRAVENOUS
  Filled 2020-09-16 (×4): qty 1

## 2020-09-16 NOTE — Progress Notes (Signed)
eeg done °

## 2020-09-16 NOTE — TOC Progression Note (Addendum)
Transition of Care (TOC) - Progression Note    Patient Details  Name: QUOC TOME Sr. MRN: [id number] Date of Birth: 1924-08-20  Transition of Care [facility]) CM/SW Contact  Eileen Stanford, LCSW Phone Number: 09/16/2020, 2:41 PM  Clinical Narrative:  Pt is off the floor. Pt's spouse and pt's daughter and palliative are meeting at bedside. Pt was admitted from [facility]. However, at this time palliative is meeting with family to discuss [place]. Per Palliative at this time family is leaning more towards comfort care/ residential hospice however, Stanton Kidney with palliative will have to evaluate pt to make sure he is eligible. Stanton Kidney will follow up with CSW to determine d/c plan after discuss with family and evaluation of pt.     Expected Discharge Plan: [place] Barriers to Discharge: Continued Medical Work up  Expected Discharge Plan and Services Expected Discharge Plan: [place] In-house Referral: NA   Post Acute Care Choice: NA Living arrangements for the past 2 months: [place]                                       Social Determinants of Health (SDOH) Interventions    Readmission Risk Interventions Readmission Risk Prevention Plan 09/04/2020  Transportation Screening Complete  PCP or Specialist Appt within 5-7 Days Complete  Home Care Screening Complete  Medication Review (RN CM) Referral to Pharmacy  Some recent data might be hidden

## 2020-09-16 NOTE — Progress Notes (Signed)
PT Cancellation Note  Patient Details Name: Dylan MALIZIA Sr. MRN: [id number] DOB: 05/24/1924   Cancelled Treatment:    Reason Eval/Treat Not Completed: Medical issues which prohibited therapy (Per chart review, pt with HTN (173/65mmHg)  outside of safe range for PT services. Will defer evaluation until later date/time once medically appropriate.)  12:58 PM, 09/16/20 [name], PT, DPT Physical Therapist - [facility]  [phone] ([place])    Adriel Kessen C 09/16/2020, 12:58 PM

## 2020-09-16 NOTE — Consult Note (Signed)
Consultation Note Date: 09/16/2020   Patient Name: Dylan HOMEWOOD Sr.  DOB: 01-06-1924  MRN: [id number]  Age / Sex: 86 y.o., male  PCP: Tonia Ghent, MD Referring Physician: Dessa Phi, DO  Reason for Consultation: Establishing goals of care and Psychosocial/spiritual support  HPI/Patient Profile: 86 y.o. male   admitted on 09/13/2020 with past medical history significant ofhypertension, hyperlipidemia, diabetes mellitus, COPD, stroke, hypothyroidism, pulmonary fibrosis, PVD,dCHF, CKD-III, BPH, skin cancer, CAD, who presents with shortness breath.  Pt was found to haveworsening shortness of breath and respiratory distress.Patient was found to have oxygen desaturation to 83% at facilitylevel 4L ofoxygeninitally.Patient was recently hospitalized from 10/26-11/11/2019 due to CHF exacerbation.  Now admitted secondary to COPD exacerbation with respiratory failure for treatmetn and stablaization  Today with altered mental status,  agitated, poor po intake.  Continued failure to thrive   Family reports continued physical and functional decline over the past several months.    They face treatment option decisions, advanced directive decisions and anticiaptroy care needs.   Patient has verbalized to family a desire for a natural death and no extreme measures.  Wife tells me it is documented and will bring paperwork for scanning.   Clinical Assessment and Goals of Care:   This NP Wadie Lessen reviewed medical records, received report from team, assessed the patient and then meet at the patient's bedside along with his wife and daughter to discuss diagnosis, prognosis, GOC, EOL wishes disposition and options.   Concept of Palliative Care was introduced as specialized medical care for people and their families living with serious illness.  If focuses on providing relief from the symptoms and stress of a  serious illness.  The goal is to improve quality of life for both the patient and the family.  Values and goals of care important to patient and family were attempted to be elicited.  A  discussion was had today regarding advanced directives.  Concepts specific to code status, artifical feeding and hydration, continued IV antibiotics and rehospitalization was had.   Education offered regarding the  difference between a aggressive medical intervention path  and a palliative comfort care path for this patient at this time was had.     Created space and opportunity for  family to explore thoughts and feelings regarding current medical information.  Both verbalize understanding of the seriousness of the situation and want only for comfort and dignity for Mr Achord.  Both family members present verbalize that all family members support a comfort path. 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    MOST form completed to reflect full comfort path.    Natural trajectory and expectations at EOL were discussed.  Questions and concerns addressed.  Patient  encouraged to call with questions or concerns.     PMT will continue to support holistically.   Wife is main Media planner with support of the whole family  SUMMARY OF RECOMMENDATIONS    Code Status/Advance Care Planning:  DNR    Symptom Management:   Pain/Dyspnea: Morphine 1 mg IV every 1 hr prn  Agitation: Haldol 5 mg IV every 4 hrs prn                       Ativan 1 mg every 4 hrs prn                       Place foley cath for EOL care   Palliative Prophylaxis:   Aspiration, Bowel Regimen, Delirium Protocol, Frequent Pain Assessment and Oral Care  Additional Recommendations (Limitations, Scope, Preferences):  Full Comfort Care  Psycho-social/Spiritual:   Desire for further Chaplaincy support:yes  Additional  Recommendations: Education on Hospice  Prognosis:   < 2 weeks  Discharge Planning: Hospice facility      Primary Diagnoses: Present on Admission:  COPD exacerbation ([place])  Hypothyroidism  Hyperlipemia  HTN (hypertension)  ILD (interstitial lung disease) ([place])  Chronic diastolic CHF (congestive heart failure) (HCC)  Acute metabolic encephalopathy  Acute on chronic respiratory failure with hypoxia (HCC)  CKD (chronic kidney disease), stage IIIa   I have reviewed the medical record, interviewed the patient and family, and examined the patient. The following aspects are pertinent.  Past Medical History:  Diagnosis Date   Arthritis    left hip   BPH (benign prostatic hyperplasia)    Cancer (HCC)    Skin cancers removed   Carotid artery occlusion    CHF (congestive heart failure) (HCC)    COPD (chronic obstructive pulmonary disease) (HCC)    Diabetes mellitus without complication ([place])    Hyperlipidemia    Hypertension    Hypothyroidism    Macular degeneration    Peripheral arterial disease ([place]) 2002   Pulmonary fibrosis ([place])    Stroke ([place]) 2005   Social History   Socioeconomic History   Marital status: Married    Spouse name: Not on file   Number of children: Not on file   Years of education: Not on file   Highest education level: Not on file  Occupational History   Not on file  Tobacco Use   Smoking status: Former Smoker    Packs/day: 1.00    Years: 63.00    Pack years: 63.00    Types: Cigarettes    Quit date: 04/09/2004    Years since quitting: 16.4   Smokeless tobacco: Former Systems developer   Tobacco comment: Quit smoking in 2005  Vaping Use   Vaping Use: Never used  Substance and Sexual Activity   Alcohol use: No    Alcohol/week: 0.0 standard drinks   Drug use: No   Sexual activity: Not on file  Other Topics Concern   Not on file  Social History Narrative   Former Dance movement psychotherapist for [employer]   Married  1996   From [place]. February '45 through February '47, honorable discharge. 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   Social Determinants of Health   Financial Resource Strain:    Difficulty of Paying Living Expenses: Not on file  Food Insecurity:    Worried About Charity fundraiser in the Last Year: Not on file   [employer] of Food in the Last Year: Not on file  Transportation Needs:    Lack of Transportation (Medical): Not on file   Lack of Transportation (Non-Medical): Not on file  Physical Activity:  Days of Exercise per Week: Not on file   Minutes of Exercise per Session: Not on file  Stress:    Feeling of Stress : Not on file  Social Connections:    Frequency of Communication with Friends and Family: Not on file   Frequency of Social Gatherings with Friends and Family: Not on file   Attends Religious Services: Not on file   Active Member of Clubs or Organizations: Not on file   Attends Archivist Meetings: Not on file   Marital Status: Not on file   Family History  Problem Relation Age of Onset   Stroke Mother    Hypertension Mother    Heart disease Father    Heart attack Father    Cancer Brother    Hypertension Brother    Scheduled Meds:  atenolol  25 mg Oral Daily   azithromycin  250 mg Oral Daily   clopidogrel  75 mg Oral Daily   clotrimazole   Topical Daily   enoxaparin (LOVENOX) injection  30 mg Subcutaneous Q24H   ezetimibe  10 mg Oral q1800   And   simvastatin  40 mg Oral q1800   feeding supplement  237 mL Oral BID BM   insulin aspart  0-5 Units Subcutaneous QHS   insulin aspart  0-9 Units Subcutaneous TID WC   ipratropium-albuterol  3 mL Nebulization BID   levothyroxine  50 mcg Oral QAC breakfast   multivitamin with minerals  1 tablet Oral Daily   sodium chloride flush  10 mL Intravenous Q12H   torsemide  20 mg Oral Daily   zinc sulfate  220 mg Oral Daily   Continuous Infusions: PRN  Meds:.acetaminophen, albuterol, dextromethorphan-guaiFENesin, hydrALAZINE, LORazepam, ondansetron (ZOFRAN) IV Medications Prior to Admission:  Prior to Admission medications   Medication Sig Start Date End Date Taking? Authorizing Provider  acetaminophen (TYLENOL) 325 MG tablet Take 650 mg by mouth every 6 (six) hours as needed for mild pain.    Yes [provider]  albuterol (VENTOLIN HFA) 108 (90 Base) MCG/ACT inhaler Inhale 2 puffs into the lungs every 6 (six) hours as needed for wheezing or shortness of breath. 07/24/20  Yes Dwyane Dee, MD  atenolol (TENORMIN) 50 MG tablet Take 1 tablet (50 mg total) by mouth daily. 07/25/20  Yes Dwyane Dee, MD  baclofen (LIORESAL) 10 MG tablet Take 10 mg by mouth 4 (four) times daily.   Yes [provider]  clopidogrel (PLAVIX) 75 MG tablet TAKE 1 TABLET (75MG ) BY MOUTH EVERY DAY 12/08/19  Yes Tonia Ghent, MD  clotrimazole-betamethasone (LOTRISONE) cream APPLY TO AFFECTED AREA TWICE A DAY 06/04/20  Yes Tonia Ghent, MD  ezetimibe-simvastatin (VYTORIN) 10-40 MG tablet TAKE 1 TABLET BY MOUTH EVERY DAY 05/19/20  Yes Tonia Ghent, MD  gabapentin (NEURONTIN) 100 MG capsule Take 2 capsules (200 mg total) by mouth 3 (three) times daily. 09/09/20  Yes Swayze, Ava, DO  guaiFENesin (MUCINEX) 600 MG 12 hr tablet Take 600 mg by mouth 2 (two) times daily.    Yes [provider]  HYDROcodone-acetaminophen (NORCO/VICODIN) 5-325 MG tablet Take 1 tablet by mouth every 4 (four) hours as needed for severe pain. 09/09/20  Yes Swayze, Ava, DO  ipratropium-albuterol (DUONEB) 0.5-2.5 (3) MG/3ML SOLN Take 3 mLs by nebulization 3 (three) times daily.   Yes [provider]  levothyroxine (SYNTHROID) 25 MCG tablet Take 2 tablets (50 mcg total) by mouth daily before breakfast. 03/29/20  Yes Tonia Ghent, MD  Multiple  Vitamins-Minerals (CENTRUM SILVER PO) Take 1 tablet by mouth daily.   Yes [provider]  potassium chloride SA  (KLOR-CON) 20 MEQ tablet Take 1 tablet (20 mEq total) by mouth 2 (two) times daily. 09/09/20  Yes Swayze, Ava, DO  sitaGLIPtin-metformin (JANUMET) 50-500 MG tablet Take 1 tablet by mouth daily.   Yes [provider]  torsemide (DEMADEX) 20 MG tablet Take 1 tablet (20 mg total) by mouth daily. 09/09/20  Yes Swayze, Ava, DO  zinc sulfate 220 (50 Zn) MG capsule Take 220 mg by mouth daily.   Yes [provider]   Allergies  Allergen Reactions   Iohexol Hives and Other (See Comments)    Went away with benadryl    Iodine Rash   Review of Systems  Unable to perform ROS: Mental status change    Physical Exam Constitutional:      Appearance: He is underweight. He is ill-appearing.     Interventions: Nasal cannula in place.  Cardiovascular:     Rate and Rhythm: Normal rate.  Skin:    General: Skin is warm and dry.  Neurological:     Mental Status: He is lethargic.  Psychiatric:        Behavior: Behavior is agitated.     Vital Signs: BP (!) 173/89 (BP Location: Left Arm)    Pulse 91    Temp 98 F (36.7 C) (Axillary)    Resp 20    Ht 5\' 2"  (1.575 m)    Wt 58.5 kg    SpO2 100%    BMI 23.58 kg/m  Pain Scale: Faces POSS *See Group Information*: 1-Acceptable,Awake and alert Pain Score: Asleep   SpO2: SpO2: 100 % O2 Device:SpO2: 100 % O2 Flow Rate: .O2 Flow Rate (L/min): 40 L/min  IO: Intake/output summary:   Intake/Output Summary (Last 24 hours) at 09/16/2020 1430 Last data filed at 09/16/2020 1011 Gross per 24 hour  Intake 10 ml  Output 2100 ml  Net -2090 ml    LBM: Last BM Date: 09/13/20 Baseline Weight: Weight: 51.6 kg Most recent weight: Weight: 58.5 kg     Palliative Assessment/Data: 30 % at best   Discussed with Dr Maylene Roes  Time In: 1515 Time Out: 1630 Time Total: 75 minutes Greater than 50%  of this time was spent counseling and coordinating care related to the above assessment and plan.  Signed by: Wadie Lessen, NP   Please contact Palliative  Medicine Team phone at [phone] for questions and concerns.  For individual provider: See Shea Evans

## 2020-09-16 NOTE — Progress Notes (Signed)
Unable to complete neuro checks patient is confused and combative. Patient does not follow commands.

## 2020-09-16 NOTE — TOC Progression Note (Signed)
Transition of Care (TOC) - Progression Note    Patient Details  Name: Dylan HECKARD Sr. MRN: [id number] Date of Birth: [birth year]  Transition of Care [facility]) CM/SW Contact  Eileen Stanford, LCSW Phone Number: 09/16/2020, 4:08 PM  Clinical Narrative:  Per palliative pt is now comfort and family has chosen [facility]. Santiago Glad discussing with family at bedside.     Expected Discharge Plan: [place] Barriers to Discharge: Continued Medical Work up  Expected Discharge Plan and Services Expected Discharge Plan: [place] In-house Referral: NA   Post Acute Care Choice: NA Living arrangements for the past 2 months: [place]                                       Social Determinants of Health (SDOH) Interventions    Readmission Risk Interventions Readmission Risk Prevention Plan 09/16/2020 09/04/2020  Transportation Screening Complete Complete  PCP or Specialist Appt within 5-7 Days - Complete  PCP or Specialist Appt within 3-5 Days Complete -  Home Care Screening - Complete  Medication Review (RN CM) - Referral to Pharmacy  [place] or Home Care Consult Complete -  Palliative Care Screening Not Applicable -  Medication Review (RN Care Manager) Complete -  Some recent data might be hidden

## 2020-09-16 NOTE — Progress Notes (Signed)
OT Cancellation Note  Patient Details Name: Dylan Knight Sr. MRN: [id number] DOB: May 26, 1924   Cancelled Treatment:    Reason Eval/Treat Not Completed: Medical issues which prohibited therapy. OT order received and chart reviewed. Pt in bed with eyes closed, rolling in bed, and moaning. He remains confused and agitated with therapy presence. Pt unable to participate in meaningful occupations at this time. His wife enters the room and pt calms some but remains unable to follow commands. OT to re-attempt when pt is able to participate in OT intervention.   Darleen Crocker, MS, OTR/L , CBIS ascom [phone]  09/16/20, 9:21 AM   09/16/2020, 9:19 AM

## 2020-09-16 NOTE — Progress Notes (Signed)
[facility] Liaison note:  New referral for family interest in [facility] home received from Palliative NP Wadie Lessen, [facility] Loletha Grayer aware. Patient information given to referral. Hospice home eligibility ahs been confirmed.  Writer met in the room with patient's wife [name] and daughter Jackelyn Poling to initiate education regarding hospice services, philosophy, team approach to care and current visitation policy with understanding voiced.  Family and hospital care team aware that [facility] is unable to offer a bed today.  Liaison will follow and update daily or sooner regarding bed availability. Thank you for this opportunity to be involved in the care of this patient and his family.  [facility] [employer] [phone]

## 2020-09-16 NOTE — Care Management Important Message (Signed)
Important Message  Patient Details  Name: Dylan Knight Sr. MRN: [id number] Date of Birth: [birth year]   Medicare Important Message Given:  Yes     Juliann Pulse A [place] Morren 09/16/2020, 1:28 PM

## 2020-09-16 NOTE — Procedures (Signed)
Patient Name: Dylan ROTHGEB Sr.  MRN: [id number]  Epilepsy Attending: Lora Havens  Referring Physician/Provider: Dr. Dessa Phi Date: 09/16/2020 Duration: 21.25 minutes  Patient history: 86 year old male with altered mental status.  EEG to evaluate for seizures.  Level of alertness: Awake  AEDs during EEG study: Lorazepam  Technical aspects: This EEG study was done with scalp electrodes positioned according to the 10-20 International system of electrode placement. Electrical activity was acquired at a sampling rate of 500Hz  and reviewed with a high frequency filter of 70Hz  and a low frequency filter of 1Hz . EEG data were recorded continuously and digitally stored.   Description: The posterior dominant rhythm consists of 8 Hz activity of moderate voltage (25-35 uV) seen predominantly in posterior head regions, symmetric and reactive to eye opening and eye closing. EEG showed intermittent generalized 3 to 6 Hz theta-delta slowing.  Triphasic waves, generalized were also noted at times.  Physiologic photic driving was not seen during photic stimulation.  Hyperventilation was not performed.     ABNORMALITY -Intermittent slow, generalized -Triphasic waves, generalized  IMPRESSION: This study is suggestive of mild diffuse encephalopathy, nonspecific etiology but could be secondary to toxic-metabolic causes.  No seizures or epileptiform discharges were seen throughout the recording.  Reta Norgren Barbra Sarks

## 2020-09-16 NOTE — Progress Notes (Signed)
PROGRESS NOTE    Dylan COBERN Sr.  KGM:[id number] DOB: 01-Apr-1924 DOA: 09/13/2020 PCP: Tonia Ghent, MD     Brief Narrative:  Dylan Knight Sr. is a 86 y.o. male with medical history significant of hypertension, hyperlipidemia, diabetes mellitus, COPD, stroke, hypothyroidism, pulmonary fibrosis, PVD, dCHF, CKD-III, BPH, skin cancer, CAD, who presents with shortness breath.  Pt was found to have worsening shortness of breath and respiratory distress. Patient was found to have oxygen desaturation to 83% on his home level 4L of oxygen initally. Patient was given DuoNeb in route with improvement in his breathing however he is still satting in the low 90s. Patient was recently hospitalized from 10/26-11/11/2019 due to CHF exacerbation.  Now admitted secondary to COPD exacerbation with respiratory failure.  New events last 24 hours / Subjective: Patient continues to have intermittent agitation, confusion.  This morning, he is actively moaning, arousable and opens his eyes to verbal and physical stimuli but does not attempt to answer any questions.  Has been very confused intermittently this hospitalization, often refusing medications and spitting it out.  Has not been eating much.  Wife is at bedside, states that he has never had any episodes of confusion during his previous hospitalizations.  She reports that he has never had diagnosis of dementia, is usually very alert and oriented at baseline.  He has no history of alcohol, drug abuse prior to admission.  Required IV Haldol and IV Ativan in the last 24 hours.  Assessment & Plan:   Principal Problem:   COPD exacerbation ([place]) Active Problems:   Diabetes mellitus type 2, controlled ([place])   Hypothyroidism   History of stroke   Hyperlipemia   HTN (hypertension)   ILD (interstitial lung disease) ([facility])   Chronic diastolic CHF (congestive heart failure) ([facility])   Acute metabolic encephalopathy   Acute on chronic respiratory failure with  hypoxia ([facility])   CKD (chronic kidney disease), stage IIIa    Acute on chronic respiratory failure with hypoxia due to COPD exacerbation in the setting of ILD (interstitial lung disease) -CTA with motion artifact, however no suspicious pulmonary artery filling defects to suggest acute PE.  Left upper lobe nodular density, follow-up with noncontrast CT chest in 6 to 12 months -Continue supplemental oxygen to maintain sat 88 to 92% -Remains on high flow oxygen -Continue azithromycin, breathing treatment -Going to stop IV Solu-Medrol and see if this is contributing to his encephalopathy  Delirium, acute metabolic encephalopathy -No history of dementia or sundowning per wife's report -Stopped prior to admission medications which could contribute including baclofen, gabapentin, pramipexole, Robaxin, norco  -Hold IV Solu-Medrol -Obtain EEG -IV Ativan as needed for agitation.  May need sitter at bedside -Delirium precaution -Palliative care consulted. Per cardiology notes from previous hospitalization on 09/06/20, they noted his overall poor prognosis   Diabetes mellitus type 2, controlled with complication for PVD -Recent A1c 6.2, well controlled.  Patient is taking Janumet at home -Sliding scale insulin  Hypothyroidism -Synthroid  History of stroke -Plavix, Vytorin  Hyperlipemia -Vytorin  HTN  -IV hydralazine as needed -Atenolol  Chronic diastolic CHF -2D echo on 64/40/3474 showed EF of 60 to 65%.  His BNP is 810, no pulmonary edema on chest x-ray.  No leg edema or JVD.  Does not seem to have CHF exacerbation. -Continue home torsemide  CKD stage IIIa -Baseline creatinine 1.2 -Stable  Leukocytosis -In setting of Solu-Medrol.  Continue to monitor.   DVT prophylaxis:  enoxaparin (LOVENOX) injection 30 mg Start:  09/15/20 2200  Code Status: DNR.  Confirmed DNR status with wife this morning Family Communication: Wife at bedside Disposition Plan:  Status is:  Inpatient  Remains inpatient appropriate because:Hemodynamically unstable, Altered mental status, IV treatments appropriate due to intensity of illness or inability to take PO and Inpatient level of care appropriate due to severity of illness   Dispo: The patient is from: SNF              Anticipated d/c is to: SNF              Anticipated d/c date is: 3 days              Patient currently is not medically stable to d/c.  Remains on high flow nasal cannula O2 today with AMS     Consultants:   None  Procedures:   None  Antimicrobials:  Anti-infectives (From admission, onward)   Start     Dose/Rate Route Frequency Ordered Stop   09/15/20 1000  azithromycin (ZITHROMAX) tablet 250 mg       "Followed by" Linked Group Details   250 mg Oral Daily 09/13/20 1321 2020/09/21 0959   09/14/20 1000  azithromycin (ZITHROMAX) tablet 500 mg       "Followed by" Linked Group Details   500 mg Oral Daily 09/13/20 1321 09/15/20 0959   09/13/20 1300  azithromycin (ZITHROMAX) 500 mg in sodium chloride 0.9 % 250 mL IVPB  Status:  Discontinued        500 mg 250 mL/hr over 60 Minutes Intravenous  Once 09/13/20 1255 09/13/20 1444       Objective: Vitals:   09/16/20 0358 09/16/20 0401 09/16/20 0738 09/16/20 0747  BP: (!) 154/78   (!) 173/89  Pulse: 80   87  Resp: 20   (!) 22  Temp: 98.6 F (37 C)   98.3 F (36.8 C)  TempSrc: Oral   Axillary  SpO2: 98%  100% 100%  Weight:  58.5 kg    Height:        Intake/Output Summary (Last 24 hours) at 09/16/2020 1043 Last data filed at 09/16/2020 1011 Gross per 24 hour  Intake 10 ml  Output 2400 ml  Net -2390 ml   Filed Weights   09/14/20 1535 09/16/20 0401  Weight: 51.6 kg 58.5 kg    Examination: General exam: Appears agitated, confused, restless Respiratory system: Clear to auscultation anteriorly. Respiratory effort normal. Cardiovascular system: S1 & S2 heard, RRR. No pedal edema. Gastrointestinal system: Abdomen is nondistended, soft and  nontender. Normal bowel sounds heard. Central nervous system: Does not follow commands or answers questions appropriately.  Opens eyes to verbal and physical stimuli Extremities: Symmetric in appearance bilaterally  Skin: No rashes, lesions or ulcers on exposed skin    Data Reviewed: I have personally reviewed following labs and imaging studies  CBC: Recent Labs  Lab 09/13/20 0109 09/13/20 1214 09/14/20 0653 09/15/20 0354 09/16/20 0457  WBC 12.5* 10.9* 11.4* 19.4* 13.6*  NEUTROABS  --  8.8*  --   --   --   HGB 15.9 16.1 14.3 15.5 16.8  HCT 46.2 46.4 40.6 45.8 48.4  MCV 102.2* 102.2* 100.0 103.9* 101.5*  PLT 234 216 222 244 938   Basic Metabolic Panel: Recent Labs  Lab 09/13/20 0109 09/13/20 1214 09/14/20 0653 09/15/20 0354 09/16/20 0457  NA 131* 134* 132* 138 141  K 4.2 4.1 4.0 4.1 4.4  CL 91* 96* 95* 102 102  CO2 27 27 24 26  25  GLUCOSE 180* 158* 205* 198* 201*  BUN 32* 26* 33* 35* 44*  CREATININE 1.43* 1.31* 1.41* 1.28* 1.46*  CALCIUM 10.0 9.5 9.3 9.6 10.2  MG 2.3  --   --   --   --    GFR: Estimated Creatinine Clearance: 23.9 mL/min (A) (by C-G formula based on SCr of 1.46 mg/dL (H)). Liver Function Tests: Recent Labs  Lab 09/13/20 0109  AST 37  ALT 31  ALKPHOS 91  BILITOT 1.7*  PROT 7.0  ALBUMIN 3.9   No results for input(s): LIPASE, AMYLASE in the last 168 hours. No results for input(s): AMMONIA in the last 168 hours. Coagulation Profile: No results for input(s): INR, PROTIME in the last 168 hours. Cardiac Enzymes: No results for input(s): CKTOTAL, CKMB, CKMBINDEX, TROPONINI in the last 168 hours. BNP (last 3 results) Recent Labs    08/01/20 1122  PROBNP 1,229.0*   HbA1C: No results for input(s): HGBA1C in the last 72 hours. CBG: Recent Labs  Lab 09/15/20 1130 09/15/20 1647 09/15/20 1937 09/15/20 2150 09/16/20 0745  GLUCAP 189* 171* 219* 226* 227*   Lipid Profile: No results for input(s): CHOL, HDL, LDLCALC, TRIG, CHOLHDL, LDLDIRECT  in the last 72 hours. Thyroid Function Tests: No results for input(s): TSH, T4TOTAL, FREET4, T3FREE, THYROIDAB in the last 72 hours. Anemia Panel: No results for input(s): VITAMINB12, FOLATE, FERRITIN, TIBC, IRON, RETICCTPCT in the last 72 hours. Sepsis Labs: Recent Labs  Lab 09/13/20 1952  PROCALCITON <0.10    Recent Results (from the past 240 hour(s))  SARS Coronavirus 2 by RT PCR (hospital order, performed in [facility] hospital lab) Nasopharyngeal Nasopharyngeal Swab     Status: None   Collection Time: 09/09/20 12:24 PM   Specimen: Nasopharyngeal Swab  Result Value Ref Range Status   SARS Coronavirus 2 NEGATIVE NEGATIVE Final    Comment: (NOTE) SARS-CoV-2 target nucleic acids are NOT DETECTED.  The SARS-CoV-2 RNA is generally detectable in upper and lower respiratory specimens during the acute phase of infection. The lowest concentration of SARS-CoV-2 viral copies this assay can detect is 250 copies / mL. A negative result does not preclude SARS-CoV-2 infection and should not be used as the sole basis for treatment or other patient management decisions.  A negative result may occur with improper specimen collection / handling, submission of specimen other than nasopharyngeal swab, presence of viral mutation(s) within the areas targeted by this assay, and inadequate number of viral copies (<250 copies / mL). A negative result must be combined with clinical observations, patient history, and epidemiological information.  Fact Sheet for Patients:   StrictlyIdeas.no  Fact Sheet for Healthcare Providers: BankingDealers.co.za  This test is not yet approved or  cleared by the [place] FDA and has been authorized for detection and/or diagnosis of SARS-CoV-2 by FDA under an Emergency Use Authorization (EUA).  This EUA will remain in effect (meaning this test can be used) for the duration of the COVID-19 declaration under  Section 564(b)(1) of the Act, 21 U.S.C. section 360bbb-3(b)(1), unless the authorization is terminated or revoked sooner.  Performed at [facility], [address] [place], [place] [address]   Respiratory Panel by RT PCR (Flu A&B, Covid) - Nasopharyngeal Swab     Status: None   Collection Time: 09/13/20 12:15 PM   Specimen: Nasopharyngeal Swab  Result Value Ref Range Status   SARS Coronavirus 2 by RT PCR NEGATIVE NEGATIVE Final    Comment: (NOTE) SARS-CoV-2 target nucleic acids are NOT DETECTED.  The  SARS-CoV-2 RNA is generally detectable in upper respiratoy specimens during the acute phase of infection. The lowest concentration of SARS-CoV-2 viral copies this assay can detect is 131 copies/mL. A negative result does not preclude SARS-Cov-2 infection and should not be used as the sole basis for treatment or other patient management decisions. A negative result may occur with  improper specimen collection/handling, submission of specimen other than nasopharyngeal swab, presence of viral mutation(s) within the areas targeted by this assay, and inadequate number of viral copies (<131 copies/mL). A negative result must be combined with clinical observations, patient history, and epidemiological information. The expected result is Negative.  Fact Sheet for Patients:  PinkCheek.be  Fact Sheet for Healthcare Providers:  GravelBags.it  This test is no t yet approved or cleared by the [place] FDA and  has been authorized for detection and/or diagnosis of SARS-CoV-2 by FDA under an Emergency Use Authorization (EUA). This EUA will remain  in effect (meaning this test can be used) for the duration of the COVID-19 declaration under Section 564(b)(1) of the Act, 21 U.S.C. section 360bbb-3(b)(1), unless the authorization is terminated or revoked sooner.     Influenza A by PCR NEGATIVE NEGATIVE Final   Influenza B  by PCR NEGATIVE NEGATIVE Final    Comment: (NOTE) The Xpert Xpress SARS-CoV-2/FLU/RSV assay is intended as an aid in  the diagnosis of influenza from Nasopharyngeal swab specimens and  should not be used as a sole basis for treatment. Nasal washings and  aspirates are unacceptable for Xpert Xpress SARS-CoV-2/FLU/RSV  testing.  Fact Sheet for Patients: PinkCheek.be  Fact Sheet for Healthcare Providers: GravelBags.it  This test is not yet approved or cleared by the [place] FDA and  has been authorized for detection and/or diagnosis of SARS-CoV-2 by  FDA under an Emergency Use Authorization (EUA). This EUA will remain  in effect (meaning this test can be used) for the duration of the  Covid-19 declaration under Section 564(b)(1) of the Act, 21  U.S.C. section 360bbb-3(b)(1), unless the authorization is  terminated or revoked. Performed at [facility], [address] [place], [place] [address]   Culture, blood (routine x 2) Call MD if unable to obtain prior to antibiotics being given     Status: None (Preliminary result)   Collection Time: 09/13/20  8:16 PM   Specimen: BLOOD  Result Value Ref Range Status   Specimen Description BLOOD LEFT ANTECUBITAL  Final   Special Requests   Final    BOTTLES DRAWN AEROBIC AND ANAEROBIC Blood Culture results may not be optimal due to an inadequate volume of blood received in culture bottles   Culture   Final    NO GROWTH 3 DAYS Performed at [facility], [address], [place], [place] [address]    Report Status PENDING  Incomplete  Culture, blood (routine x 2) Call MD if unable to obtain prior to antibiotics being given     Status: None (Preliminary result)   Collection Time: 09/13/20  8:16 PM   Specimen: BLOOD  Result Value Ref Range Status   Specimen Description BLOOD RIGHT ANTECUBITAL  Final   Special Requests   Final    BOTTLES DRAWN AEROBIC AND  ANAEROBIC Blood Culture results may not be optimal due to an inadequate volume of blood received in culture bottles   Culture   Final    NO GROWTH 3 DAYS Performed at [facility], [address], [place], [place] [address]    Report Status  PENDING  Incomplete  MRSA PCR Screening     Status: None   Collection Time: 09/16/20 12:12 AM   Specimen: Nasal Mucosa; Nasopharyngeal  Result Value Ref Range Status   MRSA by PCR NEGATIVE NEGATIVE Final    Comment:        The GeneXpert MRSA Assay (FDA approved for NASAL specimens only), is one component of a comprehensive MRSA colonization surveillance program. It is not intended to diagnose MRSA infection nor to guide or monitor treatment for MRSA infections. Performed at [facility], [address], [place], [place] [address]       Radiology Studies: No results found.    Scheduled Meds: . atenolol  25 mg Oral Daily  . azithromycin  250 mg Oral Daily  . clopidogrel  75 mg Oral Daily  . clotrimazole   Topical Daily  . enoxaparin (LOVENOX) injection  30 mg Subcutaneous Q24H  . ezetimibe  10 mg Oral q1800   And  . simvastatin  40 mg Oral q1800  . feeding supplement  237 mL Oral BID BM  . insulin aspart  0-5 Units Subcutaneous QHS  . insulin aspart  0-9 Units Subcutaneous TID WC  . ipratropium-albuterol  3 mL Nebulization BID  . levothyroxine  50 mcg Oral QAC breakfast  . methylPREDNISolone (SOLU-MEDROL) injection  40 mg Intravenous Q12H  . multivitamin with minerals  1 tablet Oral Daily  . sodium chloride flush  10 mL Intravenous Q12H  . torsemide  20 mg Oral Daily  . zinc sulfate  220 mg Oral Daily   Continuous Infusions:   LOS: 3 days      Time spent: 40 minutes   Dessa Phi, DO [facility] 09/16/2020, 10:43 AM   Available via Epic secure chat 7am-7pm After these hours, please refer to coverage provider listed on [email or web address]

## 2020-09-17 DIAGNOSIS — Z66 Do not resuscitate: Secondary | ICD-10-CM

## 2020-09-17 DIAGNOSIS — D72829 Elevated white blood cell count, unspecified: Secondary | ICD-10-CM

## 2020-09-17 DIAGNOSIS — R410 Disorientation, unspecified: Secondary | ICD-10-CM

## 2020-09-17 DIAGNOSIS — Z515 Encounter for palliative care: Secondary | ICD-10-CM

## 2020-09-17 MED ORDER — ALBUTEROL SULFATE (2.5 MG/3ML) 0.083% IN NEBU: 2.5000 mg | INHALATION_SOLUTION | RESPIRATORY_TRACT | Status: DC | PRN

## 2020-09-17 MED ORDER — MORPHINE SULFATE (PF) 2 MG/ML IV SOLN
2.0000 mg | INTRAVENOUS | Status: DC | PRN
  Administered 2020-09-17 (×3): 2 mg via INTRAVENOUS
  Filled 2020-09-17 (×3): qty 1

## 2020-09-17 MED ORDER — ONDANSETRON HCL 4 MG/2ML IJ SOLN: 4.0000 mg | Freq: Three times a day (TID) | INTRAMUSCULAR | 0 refills | Status: AC | PRN

## 2020-09-17 MED ORDER — MORPHINE SULFATE (PF) 2 MG/ML IV SOLN: 1.0000 mg | INTRAVENOUS | 0 refills | Status: AC | PRN

## 2020-09-17 MED ORDER — HALOPERIDOL LACTATE 5 MG/ML IJ SOLN: 5.0000 mg | INTRAMUSCULAR | Status: AC | PRN

## 2020-09-17 MED ORDER — IPRATROPIUM-ALBUTEROL 0.5-2.5 (3) MG/3ML IN SOLN: 3.0000 mL | Freq: Four times a day (QID) | RESPIRATORY_TRACT | Status: DC | PRN

## 2020-09-17 MED ORDER — LORAZEPAM 2 MG/ML IJ SOLN: 1.0000 mg | INTRAMUSCULAR | 0 refills | Status: AC | PRN

## 2020-09-17 NOTE — Discharge Summary (Signed)
Physician Discharge Summary  Dylan REISH Sr. DTO:[id number] DOB: [birth year] DOA: 09/13/2020  PCP: Tonia Ghent, MD  Admit date: 09/13/2020 Discharge date: 09/17/2020  Admitted From: SNF Disposition:  Residential Hospice   Brief/Interim Summary: Dylan Ny Sr. is a 86 y.o.malewith medical history significant ofhypertension, hyperlipidemia, diabetes mellitus, COPD, stroke, hypothyroidism, pulmonary fibrosis, PVD,dCHF, CKD-III, BPH, skin cancer, CAD, who presents with shortness breath.  Pt was found to haveworsening shortness of breath and respiratory distress.Patient was found to have oxygen desaturation to 83% on his homelevel 4L ofoxygeninitally.Patient was given DuoNeb in route withimprovement in his breathing however he is still satting in the low 90s.Patient was recently hospitalized from 10/26-11/11/2019 due to CHF exacerbation.  Now admitted secondary to COPD exacerbation with respiratory failure.  Patient continued to exhibit worsening encephalopathy and delirium.  He has had little to no p.o. intake, refusing his medications.  EEG was obtained which was consistent with encephalopathy without epileptiform discharge.  Patient had no improvement in several days.  His wife and daughter met with palliative care medicine, agreed to transition patient to full comfort and residential hospice.  Discharge Diagnoses:  Principal Problem:   Acute on chronic respiratory failure with hypoxia (HCC) Active Problems:   COPD exacerbation (HCC)   Diabetes mellitus type 2, controlled ([place])   Hypothyroidism   History of stroke   Advance care planning   Hyperlipemia   HTN (hypertension)   ILD (interstitial lung disease) (HCC)   Chronic diastolic CHF (congestive heart failure) (HCC)   Acute metabolic encephalopathy   CKD (chronic kidney disease), stage IIIa   Adult failure to thrive   Delirium   Leukocytosis   End of life care   DNR (do not resuscitate)   Discharge  Instructions   Allergies as of 09/17/2020      Reactions   Iohexol Hives, Other (See Comments)   Went away with benadryl   Iodine Rash      Medication List    STOP taking these medications   acetaminophen 325 MG tablet Commonly known as: TYLENOL   albuterol 108 (90 Base) MCG/ACT inhaler Commonly known as: VENTOLIN HFA   atenolol 50 MG tablet Commonly known as: TENORMIN   baclofen 10 MG tablet Commonly known as: LIORESAL   CENTRUM SILVER PO   clopidogrel 75 MG tablet Commonly known as: PLAVIX   clotrimazole-betamethasone cream Commonly known as: LOTRISONE   ezetimibe-simvastatin 10-40 MG tablet Commonly known as: VYTORIN   gabapentin 100 MG capsule Commonly known as: NEURONTIN   guaiFENesin 600 MG 12 hr tablet Commonly known as: MUCINEX   HYDROcodone-acetaminophen 5-325 MG tablet Commonly known as: NORCO/VICODIN   ipratropium-albuterol 0.5-2.5 (3) MG/3ML Soln Commonly known as: DUONEB   levothyroxine 25 MCG tablet Commonly known as: SYNTHROID   potassium chloride SA 20 MEQ tablet Commonly known as: KLOR-CON   sitaGLIPtin-metformin 50-500 MG tablet Commonly known as: JANUMET   torsemide 20 MG tablet Commonly known as: DEMADEX   zinc sulfate 220 (50 Zn) MG capsule     TAKE these medications   haloperidol lactate 5 MG/ML injection Commonly known as: HALDOL Inject 1 mL (5 mg total) into the vein every 4 (four) hours as needed.   LORazepam 2 MG/ML injection Commonly known as: ATIVAN Inject 0.5 mLs (1 mg total) into the vein every 4 (four) hours as needed for anxiety.   morphine 2 MG/ML injection Inject 0.5 mLs (1 mg total) into the vein every hour as needed.   ondansetron 4 MG/2ML Soln injection  Commonly known as: ZOFRAN Inject 2 mLs (4 mg total) into the vein every 8 (eight) hours as needed for nausea or vomiting.       Allergies  Allergen Reactions  . Iohexol Hives and Other (See Comments)    Went away with benadryl   . Iodine Rash     Consultations:  Palliative care    Procedures/Studies: EEG  Result Date: 09/16/2020 Lora Havens, MD     09/16/2020  3:32 PM Patient Name: Dylan DIMMICK Sr. MRN: [id number] Epilepsy Attending: Lora Havens Referring Physician/Provider: Dr. Dessa Phi Date: 09/16/2020 Duration: 21.25 minutes Patient history: 86 year old male with altered mental status.  EEG to evaluate for seizures. Level of alertness: Awake AEDs during EEG study: Lorazepam Technical aspects: This EEG study was done with scalp electrodes positioned according to the 10-20 International system of electrode placement. Electrical activity was acquired at a sampling rate of '500Hz'  and reviewed with a high frequency filter of '70Hz'  and a low frequency filter of '1Hz' . EEG data were recorded continuously and digitally stored. Description: The posterior dominant rhythm consists of 8 Hz activity of moderate voltage (25-35 uV) seen predominantly in posterior head regions, symmetric and reactive to eye opening and eye closing. EEG showed intermittent generalized 3 to 6 Hz theta-delta slowing.  Triphasic waves, generalized were also noted at times.  Physiologic photic driving was not seen during photic stimulation.  Hyperventilation was not performed.   ABNORMALITY -Intermittent slow, generalized -Triphasic waves, generalized IMPRESSION: This study is suggestive of mild diffuse encephalopathy, nonspecific etiology but could be secondary to toxic-metabolic causes.  No seizures or epileptiform discharges were seen throughout the recording. Lora Havens   DG Chest 2 View  Result Date: 09/03/2020 CLINICAL DATA:  Shortness of breath.  History of CHF. EXAM: CHEST - 2 VIEW COMPARISON:  07/21/2020.  CT of 07/22/2020 also reviewed. FINDINGS: Normal heart size. Hyperinflation. Right greater than left basilar predominant pulmonary interstitial thickening. Right-sided basilar opacity is to be progressive compared to the 07/21/2020 chest  radiograph. No pleural effusion or pneumothorax. IMPRESSION: Interstitial lung disease, consistent with usual interstitial pneumonia (UIP) on prior CT of 07/22/2020. Increased right lung base opacity for which superimposed infection or aspiration cannot be excluded. Consider radiographic follow-up in 3-5 days. Electronically Signed   By: Abigail Miyamoto M.D.   On: 09/03/2020 18:09   CT ANGIO CHEST PE W OR WO CONTRAST  Result Date: 09/14/2020 CLINICAL DATA:  Evaluate for acute pulmonary embolus. Worsening shortness of breath and respiratory distress. EXAM: CT ANGIOGRAPHY CHEST WITH CONTRAST TECHNIQUE: Multidetector CT imaging of the chest was performed using the standard protocol during bolus administration of intravenous contrast. Multiplanar CT image reconstructions and MIPs were obtained to evaluate the vascular anatomy. CONTRAST:  58m OMNIPAQUE IOHEXOL 350 MG/ML SOLN COMPARISON:  09/05/2020 FINDINGS: Cardiovascular: Heart size appears within normal limits. No pericardial effusion. Aortic atherosclerosis. Coronary artery calcifications. The main pulmonary artery appears patent. No central obstructing pulmonary emboli identified. No suspicious filling defects within the lobar pulmonary arteries. Respiratory motion artifact diminishes exam detail beyond the level of the lobar pulmonary arteries. Mediastinum/Nodes: Normal appearance of the thyroid gland. The trachea appears patent and is midline. Normal appearance of the esophagus. No enlarged supraclavicular, axillary, mediastinal, or hilar lymph nodes. Lungs/Pleura: Advanced changes of centrilobular and paraseptal emphysema with diffuse bronchial wall thickening. Similar appearance of bilateral peripheral and lower lung zone predominant interstitial reticulation. Airspace consolidation, atelectasis, or pneumothorax. There is a focal subpleural nodular density within the subpleural  aspect of the posteromedial left upper lobe measuring 7 mm, image 31/6. This is a  new finding compared with previous exam. Upper Abdomen: No acute abnormality. Aortic atherosclerosis noted. Small cyst noted within the posteromedial dome of liver. Musculoskeletal: No chest wall abnormality. No acute or significant osseous findings. Review of the MIP images confirms the above findings. IMPRESSION: 1. Respiratory motion artifact diminishes exam detail beyond the level of the lobar pulmonary arteries. Within this limitation no suspicious pulmonary artery filling defects identified to suggest acute pulmonary embolus. 2. Advanced changes of centrilobular and paraseptal emphysema with diffuse bronchial wall thickening, as above; imaging findings suggestive of underlying COPD. Bilateral and peripheral predominant interstitial reticulation is likely postinflammatory. 3. 7 mm subpleural nodular density within the posteromedial left upper lobe is identified. New from 07/06/2020 and therefore likely postinflammatory. Non-contrast chest CT at 6-12 months is recommended. If the nodule is stable at time of repeat CT, then future CT at 18-24 months (from today's scan) is considered optional for low-risk patients, but is recommended for high-risk patients. This recommendation follows the consensus statement: Guidelines for Management of Incidental Pulmonary Nodules Detected on CT Images: From the Fleischner Society 2017; Radiology 2017; 284:228-243. 4. Aortic atherosclerosis and coronary artery calcifications. 5. Emphysema and aortic atherosclerosis. Aortic Atherosclerosis (ICD10-I70.0) and Emphysema (ICD10-J43.9). Electronically Signed   By: Kerby Moors M.D.   On: 09/14/2020 05:25   CT ANGIO CHEST PE W OR WO CONTRAST  Result Date: 09/05/2020 CLINICAL DATA:  Worsening shortness of breath.  Orthopnea. EXAM: CT ANGIOGRAPHY CHEST WITH CONTRAST TECHNIQUE: Multidetector CT imaging of the chest was performed using the standard protocol during bolus administration of intravenous contrast. Multiplanar CT image  reconstructions and MIPs were obtained to evaluate the vascular anatomy. CONTRAST:  46m OMNIPAQUE IOHEXOL 350 MG/ML SOLN COMPARISON:  CT chest dated 07/22/2020 FINDINGS: Cardiovascular: Contrast injection is sufficient to demonstrate satisfactory opacification of the pulmonary arteries to the segmental level. There is no pulmonary embolus or evidence of right heart strain. The size of the main pulmonary artery is normal. Normal heart size with coronary artery calcification. There are atherosclerotic changes throughout the thoracic aorta without evidence for an aneurysm. Mediastinum/Nodes: --mild mediastinal adenopathy is noted, likely reactive. -- No hilar lymphadenopathy. -- No axillary lymphadenopathy. -- No supraclavicular lymphadenopathy. -- Normal thyroid gland where visualized. -  Unremarkable esophagus. Lungs/Pleura: Moderate severe emphysematous changes are again noted. There is persistent ground-glass opacification of the right lobe as before. There is slight interval worsening at the right lung base. There is mucous plugging and bronchial wall thickening primarily in the right lower lobe. There is no pneumothorax. No large pleural effusion. There is secretions within the upper trachea, otherwise the trachea is unremarkable. The previously demonstrated 1 cm pulmonary nodule in the right lower lobe appears to have essentially resolved. Upper Abdomen: Contrast bolus timing is not optimized for evaluation of the abdominal organs. The visualized portions of the organs of the upper abdomen are normal. Musculoskeletal: No chest wall abnormality. No bony spinal canal stenosis. Review of the MIP images confirms the above findings. IMPRESSION: 1. No evidence for acute pulmonary embolus. 2. Persistent findings of interstitial lung disease, not substantially changed from prior study. 3. New bronchial wall thickening and areas of consolidation involving primarily the right lower lobe is suggestive of infectious or  reactive bronchiolitis. Aortic Atherosclerosis (ICD10-I70.0) and Emphysema (ICD10-J43.9). Electronically Signed   By: CConstance HolsterM.D.   On: 09/05/2020 20:12   UKoreaVenous Img Lower Bilateral (DVT)  Result  Date: 09/07/2020 CLINICAL DATA:  86 year old with lower extremity edema. EXAM: BILATERAL LOWER EXTREMITY VENOUS DOPPLER ULTRASOUND TECHNIQUE: Gray-scale sonography with graded compression, as well as color Doppler and duplex ultrasound were performed to evaluate the lower extremity deep venous systems from the level of the common femoral vein and including the common femoral, femoral, profunda femoral, popliteal and calf veins including the posterior tibial, peroneal and gastrocnemius veins when visible. The superficial great saphenous vein was also interrogated. Spectral Doppler was utilized to evaluate flow at rest and with distal augmentation maneuvers in the common femoral, femoral and popliteal veins. COMPARISON:  05/06/2017 FINDINGS: RIGHT LOWER EXTREMITY Common Femoral Vein: No evidence of thrombus. Normal compressibility, respiratory phasicity and response to augmentation. Saphenofemoral Junction: No evidence of thrombus. Normal compressibility and flow on color Doppler imaging. Profunda Femoral Vein: No evidence of thrombus. Normal compressibility and flow on color Doppler imaging. Femoral Vein: No evidence of thrombus. Normal compressibility, respiratory phasicity and response to augmentation. Popliteal Vein: No evidence of thrombus. Normal compressibility, respiratory phasicity and response to augmentation. Calf Veins: No evidence of thrombus. Normal compressibility and flow on color Doppler imaging. Other Findings:  None. LEFT LOWER EXTREMITY Common Femoral Vein: No evidence of thrombus. Normal compressibility, respiratory phasicity and response to augmentation. Saphenofemoral Junction: No evidence of thrombus. Normal compressibility and flow on color Doppler imaging. Profunda Femoral Vein:  No evidence of thrombus. Normal compressibility and flow on color Doppler imaging. Femoral Vein: No evidence of thrombus. Normal compressibility, respiratory phasicity and response to augmentation. Popliteal Vein: No evidence of thrombus. Normal compressibility, respiratory phasicity and response to augmentation. Calf Veins: No evidence of thrombus. Normal compressibility and flow on color Doppler imaging. Other Findings:  None. IMPRESSION: No evidence of deep venous thrombosis in either lower extremity. Electronically Signed   By: Markus Daft M.D.   On: 09/07/2020 13:04   DG Chest Port 1 View  Result Date: 09/13/2020 CLINICAL DATA:  Increased shortness of breath and confusion. EXAM: PORTABLE CHEST 1 VIEW COMPARISON:  09/13/2020 FINDINGS: The cardiac silhouette remains borderline enlarged. Stable prominence of the interstitial markings. No Kerley lines or pleural fluid. Stable mild scoliosis. IMPRESSION: No acute abnormality. Stable borderline cardiomegaly and chronic interstitial lung disease. Electronically Signed   By: Claudie Revering M.D.   On: 09/13/2020 12:59   DG Chest Portable 1 View  Result Date: 09/13/2020 CLINICAL DATA:  Cough EXAM: PORTABLE CHEST 1 VIEW COMPARISON:  09/03/2020 FINDINGS: The heart is normal size. Improved bibasilar opacities. Bibasilar scarring/fibrosis. No effusions or acute bony abnormality. IMPRESSION: Chronic changes in the lung bases compatible with scarring/fibrosis. Improved opacity at the right lung base, likely resolved superimposed infection. Electronically Signed   By: Rolm Baptise M.D.   On: 09/13/2020 01:24   ECHOCARDIOGRAM COMPLETE  Result Date: 09/05/2020    ECHOCARDIOGRAM REPORT   Patient Name:   KHADEEM ROCKETT Date of Exam: 09/05/2020 Medical Rec #:  [id number]       Height:       66.0 in Accession #:    [id number]      Weight:       143.8 lb Date of Birth:  [birth year]       BSA:          1.738 m Patient Age:    31 years        BP:           140/68 mmHg Patient  Gender: M  HR:           78 bpm. Exam Location:  [facility] Procedure: 2D Echo, Cardiac Doppler and Color Doppler Indications:     CHF-acute systolic 267.12  History:         Patient has prior history of Echocardiogram examinations, most                  recent 07/20/2020. COPD and Stroke; Risk Factors:Hypertension                  and Diabetes.  Sonographer:     JERRY Referring Phys:  [id number] AVA SWAYZE Diagnosing Phys: Ida Rogue MD  Sonographer Comments: Technically challenging study due to limited acoustic windows, no apical window and no parasternal window. Image acquisition challenging due to patient body habitus. IMPRESSIONS  1. Left ventricular ejection fraction, by estimation, is 60 to 65%. The left ventricle has normal function. The left ventricle has no regional wall motion abnormalities with septal flattening consistent with RV pressure and volume overload..  2. Right ventricular systolic function is moderately reduced. The right ventricular size is severely enlarged. There is severely elevated pulmonary artery systolic pressure. The estimated right ventricular systolic pressure is 99.8 mmHg.  3. Right atrial size was severely dilated.  4. Mild mitral valve regurgitation.  5. Tricuspid valve regurgitation is moderate to severe. FINDINGS  Left Ventricle: Left ventricular ejection fraction, by estimation, is 55 to 60%. The left ventricle has normal function. The left ventricle has no regional wall motion abnormalities. The left ventricular internal cavity size was normal in size. There is  no left ventricular hypertrophy. Left ventricular diastolic parameters are indeterminate. Right Ventricle: The right ventricular size is severely enlarged. No increase in right ventricular wall thickness. Right ventricular systolic function is moderately reduced. There is severely elevated pulmonary artery systolic pressure. The tricuspid regurgitant velocity is 4.26 m/s, and with an assumed right atrial pressure  of 10 mmHg, the estimated right ventricular systolic pressure is 33.8 mmHg. Left Atrium: Left atrial size was normal in size. Right Atrium: Right atrial size was severely dilated. Pericardium: There is no evidence of pericardial effusion. Mitral Valve: The mitral valve is normal in structure. Mild mitral valve regurgitation. No evidence of mitral valve stenosis. Tricuspid Valve: The tricuspid valve is normal in structure. Tricuspid valve regurgitation is moderate to severe. No evidence of tricuspid stenosis. Aortic Valve: The aortic valve is normal in structure. Aortic valve regurgitation is not visualized. Mild to moderate aortic valve sclerosis/calcification is present, without any evidence of aortic stenosis. Pulmonic Valve: The pulmonic valve was normal in structure. Pulmonic valve regurgitation is not visualized. No evidence of pulmonic stenosis. Aorta: The aortic root is normal in size and structure. Venous: The pulmonary veins were not well visualized. The inferior vena cava is normal in size with greater than 50% respiratory variability, suggesting right atrial pressure of 3 mmHg. IAS/Shunts: No atrial level shunt detected by color flow Doppler.  LEFT VENTRICLE PLAX 2D LVIDd:         2.74 cm LVIDs:         1.80 cm LV PW:         1.34 cm LV IVS:        1.32 cm  LEFT ATRIUM         Index LA diam:    3.80 cm 2.19 cm/m  PULMONIC VALVE PV Vmax:        0.68 m/s PV Peak grad:   1.9 mmHg RVOT Peak grad:  2 mmHg  TRICUSPID VALVE TR Peak grad:   72.6 mmHg TR Vmax:        426.00 cm/s Ida Rogue MD Electronically signed by Ida Rogue MD Signature Date/Time: 09/05/2020/12:51:23 PM    Final        Discharge Exam: Vitals:   09/17/20 0412 09/17/20 0815  BP: (!) 133/97 122/63  Pulse: (!) 108 95  Resp: 19 14  Temp: 98.4 F (36.9 C) 98 F (36.7 C)  SpO2: 98% 91%     General: Pt is alert but unresponsive to voice, restless   The results of significant diagnostics from this hospitalization (including  imaging, microbiology, ancillary and laboratory) are listed below for reference.     Microbiology: Recent Results (from the past 240 hour(s))  SARS Coronavirus 2 by RT PCR (hospital order, performed in [facility] hospital lab) Nasopharyngeal Nasopharyngeal Swab     Status: None   Collection Time: 09/09/20 12:24 PM   Specimen: Nasopharyngeal Swab  Result Value Ref Range Status   SARS Coronavirus 2 NEGATIVE NEGATIVE Final    Comment: (NOTE) SARS-CoV-2 target nucleic acids are NOT DETECTED.  The SARS-CoV-2 RNA is generally detectable in upper and lower respiratory specimens during the acute phase of infection. The lowest concentration of SARS-CoV-2 viral copies this assay can detect is 250 copies / mL. A negative result does not preclude SARS-CoV-2 infection and should not be used as the sole basis for treatment or other patient management decisions.  A negative result may occur with improper specimen collection / handling, submission of specimen other than nasopharyngeal swab, presence of viral mutation(s) within the areas targeted by this assay, and inadequate number of viral copies (<250 copies / mL). A negative result must be combined with clinical observations, patient history, and epidemiological information.  Fact Sheet for Patients:   StrictlyIdeas.no  Fact Sheet for Healthcare Providers: BankingDealers.co.za  This test is not yet approved or  cleared by the [place] FDA and has been authorized for detection and/or diagnosis of SARS-CoV-2 by FDA under an Emergency Use Authorization (EUA).  This EUA will remain in effect (meaning this test can be used) for the duration of the COVID-19 declaration under Section 564(b)(1) of the Act, 21 U.S.C. section 360bbb-3(b)(1), unless the authorization is terminated or revoked sooner.  Performed at [facility], [address] [place], [place] [address]   Respiratory  Panel by RT PCR (Flu A&B, Covid) - Nasopharyngeal Swab     Status: None   Collection Time: 09/13/20 12:15 PM   Specimen: Nasopharyngeal Swab  Result Value Ref Range Status   SARS Coronavirus 2 by RT PCR NEGATIVE NEGATIVE Final    Comment: (NOTE) SARS-CoV-2 target nucleic acids are NOT DETECTED.  The SARS-CoV-2 RNA is generally detectable in upper respiratoy specimens during the acute phase of infection. The lowest concentration of SARS-CoV-2 viral copies this assay can detect is 131 copies/mL. A negative result does not preclude SARS-Cov-2 infection and should not be used as the sole basis for treatment or other patient management decisions. A negative result may occur with  improper specimen collection/handling, submission of specimen other than nasopharyngeal swab, presence of viral mutation(s) within the areas targeted by this assay, and inadequate number of viral copies (<131 copies/mL). A negative result must be combined with clinical observations, patient history, and epidemiological information. The expected result is Negative.  Fact Sheet for Patients:  PinkCheek.be  Fact Sheet for Healthcare Providers:  GravelBags.it  This test is no t yet approved or  cleared by the [place] and  has been authorized for detection and/or diagnosis of SARS-CoV-2 by FDA under an Emergency Use Authorization (EUA). This EUA will remain  in effect (meaning this test can be used) for the duration of the COVID-19 declaration under Section 564(b)(1) of the Act, 21 U.S.C. section 360bbb-3(b)(1), unless the authorization is terminated or revoked sooner.     Influenza A by PCR NEGATIVE NEGATIVE Final   Influenza B by PCR NEGATIVE NEGATIVE Final    Comment: (NOTE) The Xpert Xpress SARS-CoV-2/FLU/RSV assay is intended as an aid in  the diagnosis of influenza from Nasopharyngeal swab specimens and  should not be used as a sole basis  for treatment. Nasal washings and  aspirates are unacceptable for Xpert Xpress SARS-CoV-2/FLU/RSV  testing.  Fact Sheet for Patients: PinkCheek.be  Fact Sheet for Healthcare Providers: GravelBags.it  This test is not yet approved or cleared by the [place] FDA and  has been authorized for detection and/or diagnosis of SARS-CoV-2 by  FDA under an Emergency Use Authorization (EUA). This EUA will remain  in effect (meaning this test can be used) for the duration of the  Covid-19 declaration under Section 564(b)(1) of the Act, 21  U.S.C. section 360bbb-3(b)(1), unless the authorization is  terminated or revoked. Performed at [facility], [address] [place], [place] [address]   Culture, blood (routine x 2) Call MD if unable to obtain prior to antibiotics being given     Status: None (Preliminary result)   Collection Time: 09/13/20  8:16 PM   Specimen: BLOOD  Result Value Ref Range Status   Specimen Description BLOOD LEFT ANTECUBITAL  Final   Special Requests   Final    BOTTLES DRAWN AEROBIC AND ANAEROBIC Blood Culture results may not be optimal due to an inadequate volume of blood received in culture bottles   Culture   Final    NO GROWTH 4 DAYS Performed at [facility], [address], [place], [place] [address]    Report Status PENDING  Incomplete  Culture, blood (routine x 2) Call MD if unable to obtain prior to antibiotics being given     Status: None (Preliminary result)   Collection Time: 09/13/20  8:16 PM   Specimen: BLOOD  Result Value Ref Range Status   Specimen Description BLOOD RIGHT ANTECUBITAL  Final   Special Requests   Final    BOTTLES DRAWN AEROBIC AND ANAEROBIC Blood Culture results may not be optimal due to an inadequate volume of blood received in culture bottles   Culture   Final    NO GROWTH 4 DAYS Performed at [facility], [address], [place], [place]  [address]    Report Status PENDING  Incomplete  MRSA PCR Screening     Status: None   Collection Time: 09/16/20 12:12 AM   Specimen: Nasal Mucosa; Nasopharyngeal  Result Value Ref Range Status   MRSA by PCR NEGATIVE NEGATIVE Final    Comment:        The GeneXpert MRSA Assay (FDA approved for NASAL specimens only), is one component of a comprehensive MRSA colonization surveillance program. It is not intended to diagnose MRSA infection nor to guide or monitor treatment for MRSA infections. Performed at [facility], [address] [place], [place] [address]      Labs: BNP (last 3 results) Recent Labs    09/06/20 1436 09/07/20 0610 09/13/20 1214  BNP 1,007.5* 479.4* 809.9*   Basic Metabolic Panel: Recent  Labs  Lab 09/13/20 0109 09/13/20 1214 09/14/20 0653 09/15/20 0354 09/16/20 0457  NA 131* 134* 132* 138 141  K 4.2 4.1 4.0 4.1 4.4  CL 91* 96* 95* 102 102  CO2 '27 27 24 26 25  ' GLUCOSE 180* 158* 205* 198* 201*  BUN 32* 26* 33* 35* 44*  CREATININE 1.43* 1.31* 1.41* 1.28* 1.46*  CALCIUM 10.0 9.5 9.3 9.6 10.2  MG 2.3  --   --   --   --    Liver Function Tests: Recent Labs  Lab 09/13/20 0109  AST 37  ALT 31  ALKPHOS 91  BILITOT 1.7*  PROT 7.0  ALBUMIN 3.9   No results for input(s): LIPASE, AMYLASE in the last 168 hours. No results for input(s): AMMONIA in the last 168 hours. CBC: Recent Labs  Lab 09/13/20 0109 09/13/20 1214 09/14/20 0653 09/15/20 0354 09/16/20 0457  WBC 12.5* 10.9* 11.4* 19.4* 13.6*  NEUTROABS  --  8.8*  --   --   --   HGB 15.9 16.1 14.3 15.5 16.8  HCT 46.2 46.4 40.6 45.8 48.4  MCV 102.2* 102.2* 100.0 103.9* 101.5*  PLT 234 216 222 244 311   Cardiac Enzymes: No results for input(s): CKTOTAL, CKMB, CKMBINDEX, TROPONINI in the last 168 hours. BNP: Invalid input(s): POCBNP CBG: Recent Labs  Lab 09/15/20 1647 09/15/20 1937 09/15/20 2150 09/16/20 0745 09/16/20 1210  GLUCAP 171* 219* 226* 227* 209*   D-Dimer No  results for input(s): DDIMER in the last 72 hours. Hgb A1c No results for input(s): HGBA1C in the last 72 hours. Lipid Profile No results for input(s): CHOL, HDL, LDLCALC, TRIG, CHOLHDL, LDLDIRECT in the last 72 hours. Thyroid function studies No results for input(s): TSH, T4TOTAL, T3FREE, THYROIDAB in the last 72 hours.  Invalid input(s): FREET3 Anemia work up No results for input(s): VITAMINB12, FOLATE, FERRITIN, TIBC, IRON, RETICCTPCT in the last 72 hours. Urinalysis    Component Value Date/Time   COLORURINE YELLOW (A) 07/19/2020 1956   APPEARANCEUR CLEAR (A) 07/19/2020 1956   APPEARANCEUR Clear 11/24/2014 1359   LABSPEC 1.008 07/19/2020 1956   LABSPEC 1.008 11/24/2014 1359   PHURINE 7.0 07/19/2020 1956   GLUCOSEU NEGATIVE 07/19/2020 1956   GLUCOSEU Negative 11/24/2014 1359   HGBUR NEGATIVE 07/19/2020 1956   BILIRUBINUR NEGATIVE 07/19/2020 1956   BILIRUBINUR Negative 11/24/2014 [place] 07/19/2020 1956   PROTEINUR NEGATIVE 07/19/2020 1956   NITRITE NEGATIVE 07/19/2020 1956   LEUKOCYTESUR NEGATIVE 07/19/2020 1956   LEUKOCYTESUR Negative 11/24/2014 1359   Sepsis Labs Invalid input(s): PROCALCITONIN,  WBC,  LACTICIDVEN Microbiology Recent Results (from the past 240 hour(s))  SARS Coronavirus 2 by RT PCR (hospital order, performed in [place] hospital lab) Nasopharyngeal Nasopharyngeal Swab     Status: None   Collection Time: 09/09/20 12:24 PM   Specimen: Nasopharyngeal Swab  Result Value Ref Range Status   SARS Coronavirus 2 NEGATIVE NEGATIVE Final    Comment: (NOTE) SARS-CoV-2 target nucleic acids are NOT DETECTED.  The SARS-CoV-2 RNA is generally detectable in upper and lower respiratory specimens during the acute phase of infection. The lowest concentration of SARS-CoV-2 viral copies this assay can detect is 250 copies / mL. A negative result does not preclude SARS-CoV-2 infection and should not be used as the sole basis for treatment or  other patient management decisions.  A negative result may occur with improper specimen collection / handling, submission of specimen other than nasopharyngeal swab, presence of viral mutation(s) within the areas targeted by this  assay, and inadequate number of viral copies (<250 copies / mL). A negative result must be combined with clinical observations, patient history, and epidemiological information.  Fact Sheet for Patients:   StrictlyIdeas.no  Fact Sheet for Healthcare Providers: BankingDealers.co.za  This test is not yet approved or  cleared by the [place] FDA and has been authorized for detection and/or diagnosis of SARS-CoV-2 by FDA under an Emergency Use Authorization (EUA).  This EUA will remain in effect (meaning this test can be used) for the duration of the COVID-19 declaration under Section 564(b)(1) of the Act, 21 U.S.C. section 360bbb-3(b)(1), unless the authorization is terminated or revoked sooner.  Performed at [facility], [address] [place], [place] [address]   Respiratory Panel by RT PCR (Flu A&B, Covid) - Nasopharyngeal Swab     Status: None   Collection Time: 09/13/20 12:15 PM   Specimen: Nasopharyngeal Swab  Result Value Ref Range Status   SARS Coronavirus 2 by RT PCR NEGATIVE NEGATIVE Final    Comment: (NOTE) SARS-CoV-2 target nucleic acids are NOT DETECTED.  The SARS-CoV-2 RNA is generally detectable in upper respiratoy specimens during the acute phase of infection. The lowest concentration of SARS-CoV-2 viral copies this assay can detect is 131 copies/mL. A negative result does not preclude SARS-Cov-2 infection and should not be used as the sole basis for treatment or other patient management decisions. A negative result may occur with  improper specimen collection/handling, submission of specimen other than nasopharyngeal swab, presence of viral mutation(s) within the areas  targeted by this assay, and inadequate number of viral copies (<131 copies/mL). A negative result must be combined with clinical observations, patient history, and epidemiological information. The expected result is Negative.  Fact Sheet for Patients:  PinkCheek.be  Fact Sheet for Healthcare Providers:  GravelBags.it  This test is no t yet approved or cleared by the [place] FDA and  has been authorized for detection and/or diagnosis of SARS-CoV-2 by FDA under an Emergency Use Authorization (EUA). This EUA will remain  in effect (meaning this test can be used) for the duration of the COVID-19 declaration under Section 564(b)(1) of the Act, 21 U.S.C. section 360bbb-3(b)(1), unless the authorization is terminated or revoked sooner.     Influenza A by PCR NEGATIVE NEGATIVE Final   Influenza B by PCR NEGATIVE NEGATIVE Final    Comment: (NOTE) The Xpert Xpress SARS-CoV-2/FLU/RSV assay is intended as an aid in  the diagnosis of influenza from Nasopharyngeal swab specimens and  should not be used as a sole basis for treatment. Nasal washings and  aspirates are unacceptable for Xpert Xpress SARS-CoV-2/FLU/RSV  testing.  Fact Sheet for Patients: PinkCheek.be  Fact Sheet for Healthcare Providers: GravelBags.it  This test is not yet approved or cleared by the [place] FDA and  has been authorized for detection and/or diagnosis of SARS-CoV-2 by  FDA under an Emergency Use Authorization (EUA). This EUA will remain  in effect (meaning this test can be used) for the duration of the  Covid-19 declaration under Section 564(b)(1) of the Act, 21  U.S.C. section 360bbb-3(b)(1), unless the authorization is  terminated or revoked. Performed at [facility], [address] [place], [place] [address]   Culture, blood (routine x 2) Call MD if unable to obtain  prior to antibiotics being given     Status: None (Preliminary result)   Collection Time: 09/13/20  8:16 PM   Specimen: BLOOD  Result Value Ref Range Status   Specimen Description  BLOOD LEFT ANTECUBITAL  Final   Special Requests   Final    BOTTLES DRAWN AEROBIC AND ANAEROBIC Blood Culture results may not be optimal due to an inadequate volume of blood received in culture bottles   Culture   Final    NO GROWTH 4 DAYS Performed at [facility], [address] [place], [place] [address]    Report Status PENDING  Incomplete  Culture, blood (routine x 2) Call MD if unable to obtain prior to antibiotics being given     Status: None (Preliminary result)   Collection Time: 09/13/20  8:16 PM   Specimen: BLOOD  Result Value Ref Range Status   Specimen Description BLOOD RIGHT ANTECUBITAL  Final   Special Requests   Final    BOTTLES DRAWN AEROBIC AND ANAEROBIC Blood Culture results may not be optimal due to an inadequate volume of blood received in culture bottles   Culture   Final    NO GROWTH 4 DAYS Performed at [facility], [address] [place], [place] [address]    Report Status PENDING  Incomplete  MRSA PCR Screening     Status: None   Collection Time: 09/16/20 12:12 AM   Specimen: Nasal Mucosa; Nasopharyngeal  Result Value Ref Range Status   MRSA by PCR NEGATIVE NEGATIVE Final    Comment:        The GeneXpert MRSA Assay (FDA approved for NASAL specimens only), is one component of a comprehensive MRSA colonization surveillance program. It is not intended to diagnose MRSA infection nor to guide or monitor treatment for MRSA infections. Performed at [facility], [address] [place],  [address]      Patient was seen and examined on the day of discharge and was found to be in stable condition. Time coordinating discharge: 35 minutes including assessment and coordination of care, as well as examination of the patient.    SIGNED:  Dessa Phi, DO [facility] 09/17/2020, 10:29 AM

## 2020-09-17 NOTE — TOC Transition Note (Signed)
Transition of Care [facility]) - CM/SW Discharge Note   Patient Details  Name: Dylan JIPSON Sr. MRN: [id number] Date of Birth: 02-Dec-1923  Transition of Care [facility]) CM/SW Contact:  Eileen Stanford, LCSW Phone Number: 09/17/2020, 11:47 AM   Clinical Narrative:   Pt will d/c to [facility] today. Transport will be provided by [facility] and pickup time is 5:30 PM. RN aware.    Final next level of care: [place] Barriers to Discharge: No Barriers Identified   Patient Goals and CMS Choice        Discharge Placement              Patient chooses bed at:  [facility]) Patient to be transferred to facility by: [facility]   Patient and family notified of of transfer: 09/17/20  Discharge Plan and Services In-house Referral: NA   Post Acute Care Choice: NA                               Social Determinants of Health (SDOH) Interventions     Readmission Risk Interventions Readmission Risk Prevention Plan 09/16/2020 09/04/2020  Transportation Screening Complete Complete  PCP or Specialist Appt within 5-7 Days - Complete  PCP or Specialist Appt within 3-5 Days Complete -  Home Care Screening - Complete  Medication Review (RN CM) - Referral to Pharmacy  HRI or Home Care Consult Complete -  Palliative Care Screening Not Applicable -  Medication Review (RN Care Manager) Complete -  Some recent data might be hidden

## 2020-09-17 NOTE — Progress Notes (Signed)
Pt has been intermittantly moaning in pain and pulling at lines, throws his leg over side of bed, etc...  Pt has been treated intermittantly with haldol, ativan, and morphine which seems to help for a couple hours at a time.

## 2020-09-17 NOTE — Progress Notes (Signed)
[facility] hospital Liaison:  Follow up visit made to new referral for [facility]. Mrs. Eno and daughter Mariann Laster at bedside. [facility] is able to offer a bed today. Family has accepted. Hospital care team updated. Plan is for discharge today at 5:30 via [place] with signed out of facility DNR on place. Report has been called to the hospice home. Please leave peripheral IV's and foley in place at discharge. Thank you. Flo Shanks BSN, RN, [place] [phone]

## 2020-09-18 LAB — CULTURE, BLOOD (ROUTINE X 2)
Culture: NO GROWTH
Culture: NO GROWTH

## 2020-09-18 NOTE — Telephone Encounter (Signed)
I had done the prev paperwork.  He was recently discharged from inpatient stay to hospice.  I thought this was addressed.

## 2020-09-19 ENCOUNTER — Telehealth: Payer: Self-pay | Admitting: Family Medicine

## 2020-09-20 NOTE — Telephone Encounter (Signed)
Per phone note on 10-02-2020 pt passed away on 10-02-2020. FYI to [employer].

## 2020-09-22 NOTE — Telephone Encounter (Signed)
Late entry.  I called his wife on 09/20/20 and gave my condolences.  She thanked me for the call.

## 2020-09-23 ENCOUNTER — Ambulatory Visit: Payer: Medicare PPO | Admitting: Family

## 2020-09-23 DIAGNOSIS — J849 Interstitial pulmonary disease, unspecified: Secondary | ICD-10-CM | POA: Diagnosis not present

## 2020-09-23 DIAGNOSIS — J9601 Acute respiratory failure with hypoxia: Secondary | ICD-10-CM | POA: Diagnosis not present

## 2020-09-23 DIAGNOSIS — I509 Heart failure, unspecified: Secondary | ICD-10-CM | POA: Diagnosis not present

## 2020-10-09 NOTE — Telephone Encounter (Signed)
Pt transitioned to hospice and it was reported that he had passed away today.  I called and LMOVM for his wife.  I will check on her again in the near future.    I was always glad to see this gentleman here in clinic.  He was a kind person and I enjoyed our conversations.

## 2020-10-09 DEATH — deceased

## 2020-10-30 ENCOUNTER — Ambulatory Visit: Payer: Medicare PPO | Admitting: Internal Medicine

## 2022-03-03 IMAGING — DX DG CHEST 2V
2 series · 2 of 2 positions shown · non-contrast
Comparison: Chest x-ray 11/02/2016.

CLINICAL DATA: [AGE] male with history of cough with sputum
production and small amount of hemoptysis.

EXAM:
CHEST - 2 VIEW

[chest pa]
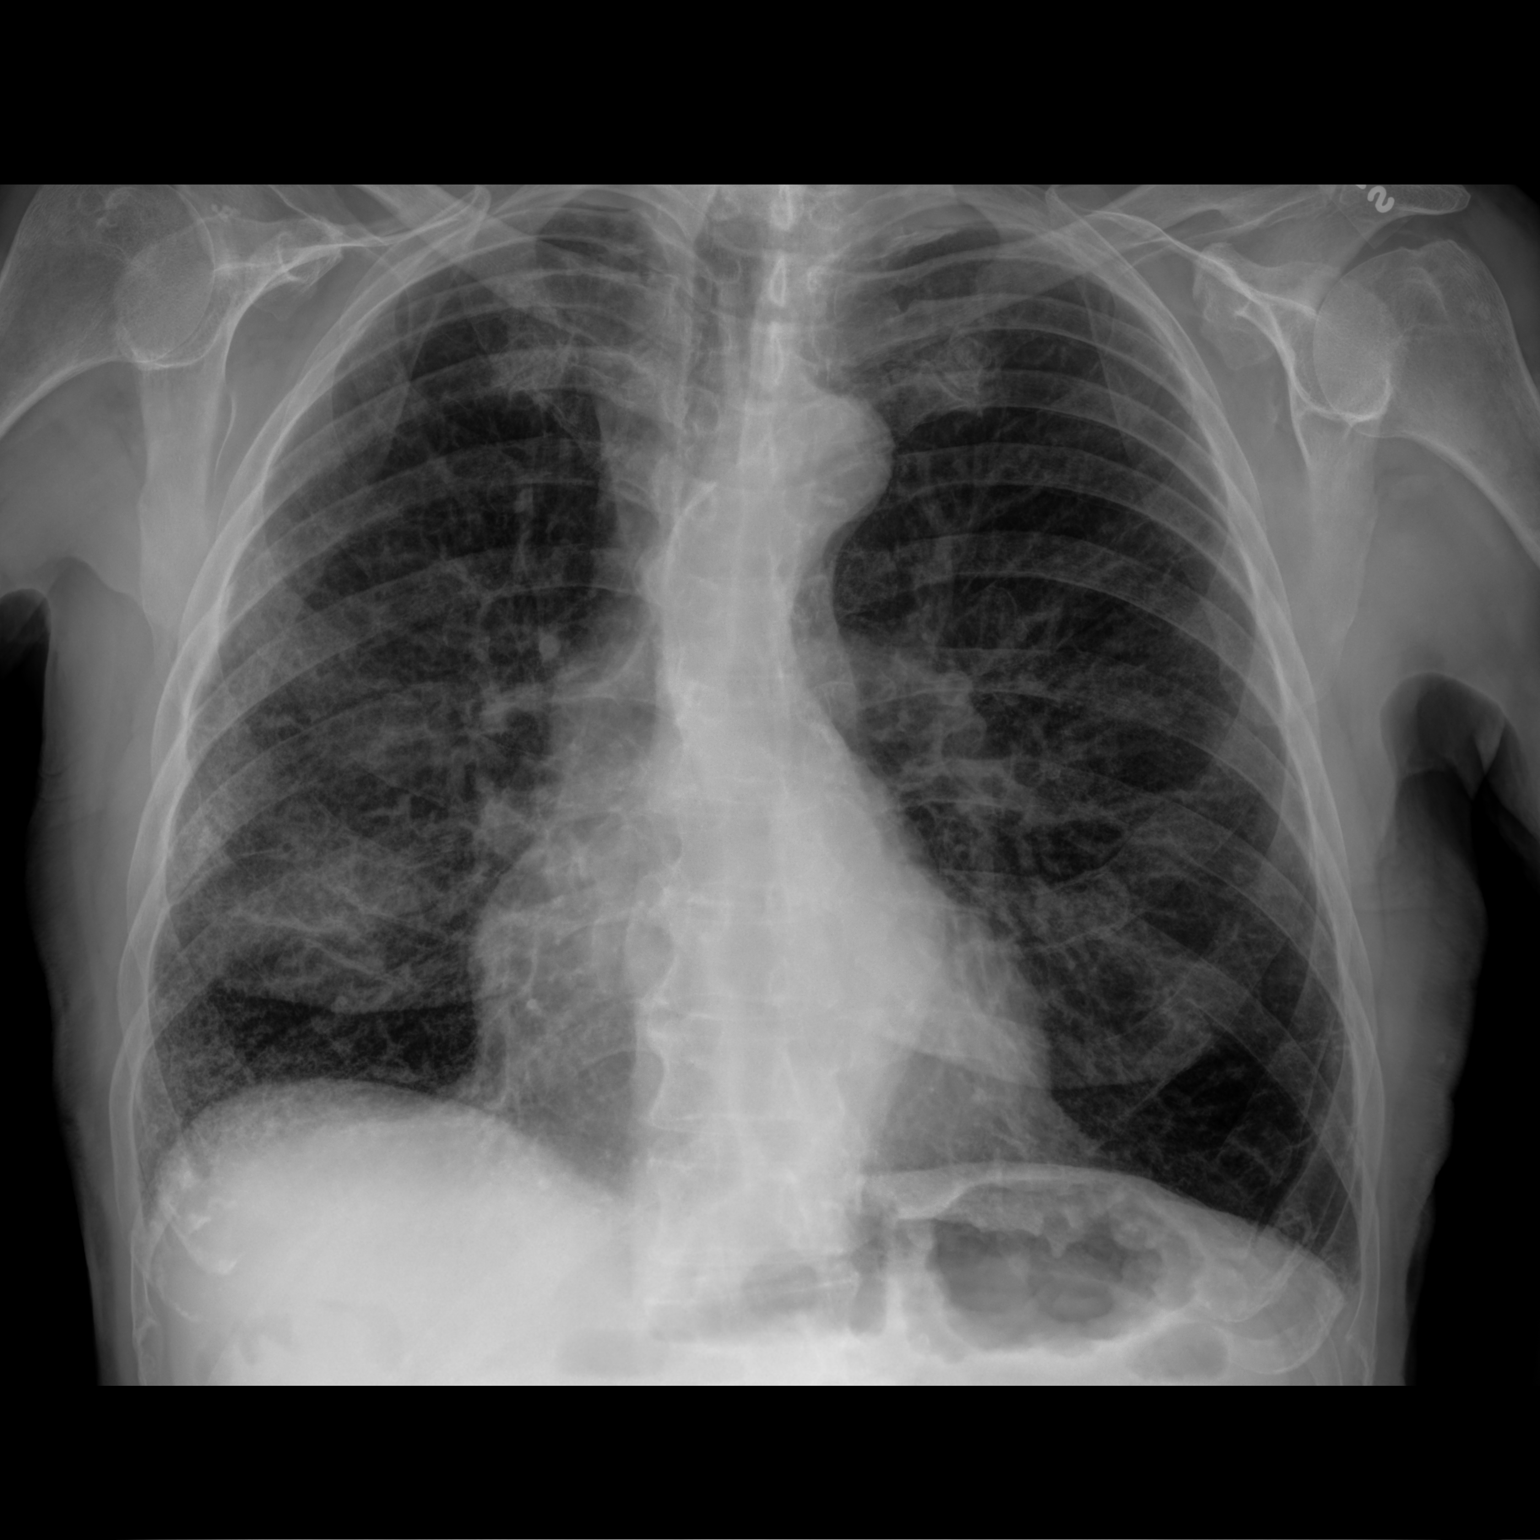

[chest lat]
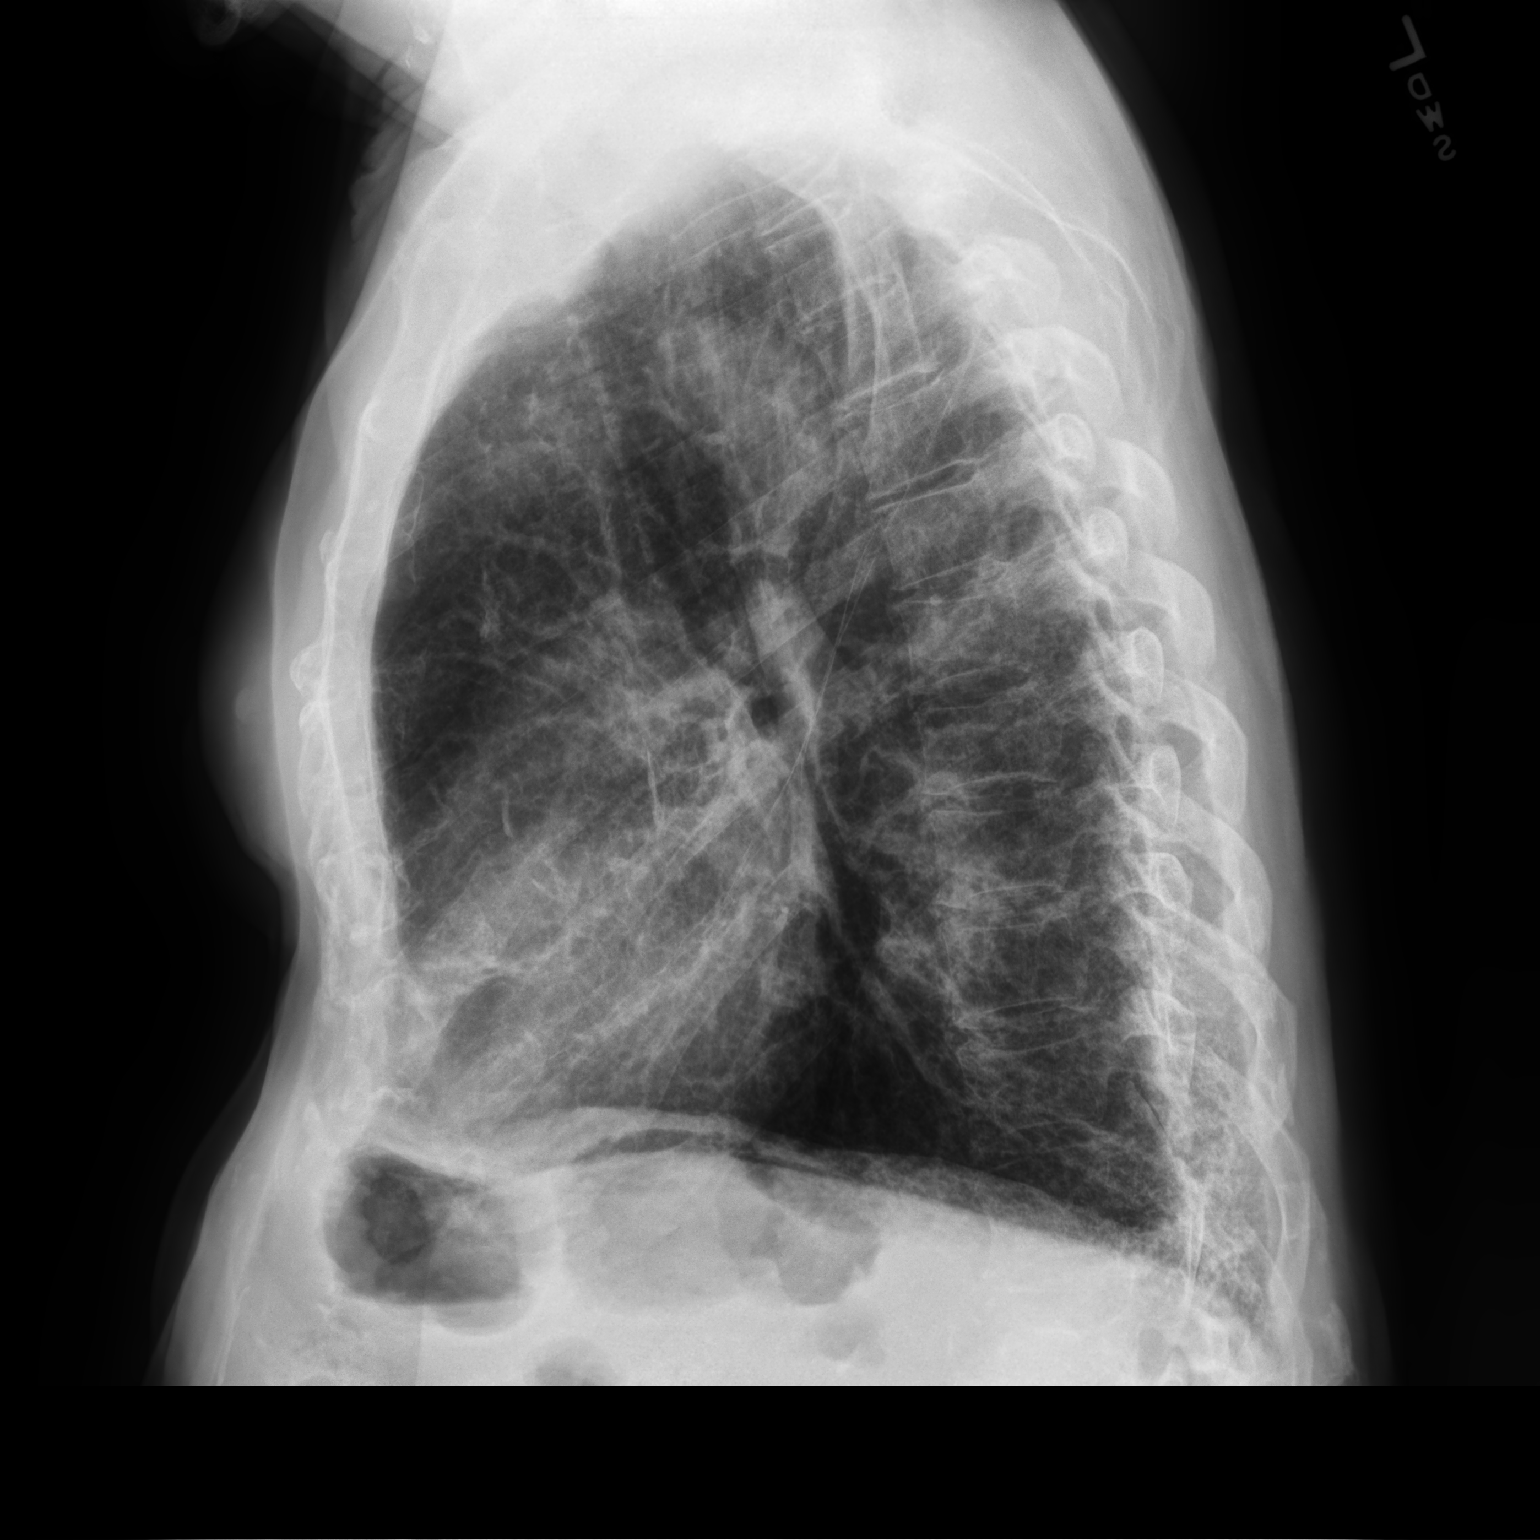

[2 of 2 positions shown; findings below may reference images not displayed]

FINDINGS: Lung volumes are increased. Diffuse peribronchial cuffing with
widespread areas of interstitial prominence scattered throughout the
lungs bilaterally, most severe throughout the mid to lower right
lung. No confluent consolidative airspace disease. No pleural
effusions. No evidence of pulmonary edema. No pneumothorax. Heart
size is normal. Upper mediastinal contours are within normal limits.
Aortic atherosclerosis.
IMPRESSION: 1. Findings are concerning for bronchitis and potential
bronchopneumonia developing throughout the right middle and lower
lobes.
2. Aortic atherosclerosis.
